# Patient Record
Sex: Male | Born: 1940 | Race: White | Hispanic: No | Marital: Married | Smoking: Former smoker
Health system: Southern US, Community
[De-identification: ages and names within clinical notes are randomized; demographics above are authoritative.]

## PROBLEM LIST (undated history)

## (undated) DIAGNOSIS — F40240 Claustrophobia: Secondary | ICD-10-CM

## (undated) DIAGNOSIS — E119 Type 2 diabetes mellitus without complications: Secondary | ICD-10-CM

## (undated) DIAGNOSIS — I493 Ventricular premature depolarization: Secondary | ICD-10-CM

## (undated) DIAGNOSIS — F419 Anxiety disorder, unspecified: Secondary | ICD-10-CM

## (undated) DIAGNOSIS — C4490 Unspecified malignant neoplasm of skin, unspecified: Secondary | ICD-10-CM

## (undated) DIAGNOSIS — G4733 Obstructive sleep apnea (adult) (pediatric): Secondary | ICD-10-CM

## (undated) DIAGNOSIS — I519 Heart disease, unspecified: Secondary | ICD-10-CM

## (undated) DIAGNOSIS — K529 Noninfective gastroenteritis and colitis, unspecified: Secondary | ICD-10-CM

## (undated) DIAGNOSIS — I10 Essential (primary) hypertension: Secondary | ICD-10-CM

## (undated) DIAGNOSIS — I4891 Unspecified atrial fibrillation: Secondary | ICD-10-CM

## (undated) DIAGNOSIS — E785 Hyperlipidemia, unspecified: Secondary | ICD-10-CM

## (undated) DIAGNOSIS — I509 Heart failure, unspecified: Secondary | ICD-10-CM

## (undated) DIAGNOSIS — S2232XA Fracture of one rib, left side, initial encounter for closed fracture: Secondary | ICD-10-CM

## (undated) DIAGNOSIS — M109 Gout, unspecified: Secondary | ICD-10-CM

## (undated) HISTORY — DX: Hyperlipidemia, unspecified: E78.5

## (undated) HISTORY — DX: Fracture of one rib, left side, initial encounter for closed fracture: S22.32XA

## (undated) HISTORY — DX: Type 2 diabetes mellitus without complications: E11.9

## (undated) HISTORY — DX: Anxiety disorder, unspecified: F41.9

## (undated) HISTORY — DX: Gout, unspecified: M10.9

## (undated) HISTORY — DX: Essential (primary) hypertension: I10

## (undated) HISTORY — DX: Heart disease, unspecified: I51.9

## (undated) HISTORY — DX: Obstructive sleep apnea (adult) (pediatric): G47.33

## (undated) HISTORY — DX: Unspecified atrial fibrillation: I48.91

## (undated) HISTORY — DX: Claustrophobia: F40.240

## (undated) HISTORY — DX: Unspecified malignant neoplasm of skin, unspecified: C44.90

## (undated) HISTORY — PX: MOHS SURGERY: SUR867

## (undated) HISTORY — DX: Ventricular premature depolarization: I49.3

## (undated) HISTORY — DX: Noninfective gastroenteritis and colitis, unspecified: K52.9

## (undated) HISTORY — DX: Heart failure, unspecified: I50.9

---

## 1998-07-02 ENCOUNTER — Other Ambulatory Visit: Admission: RE | Admit: 1998-07-02 | Discharge: 1998-07-02 | Payer: Self-pay | Admitting: Gastroenterology

## 2000-11-15 ENCOUNTER — Encounter: Payer: Self-pay | Admitting: Emergency Medicine

## 2000-11-15 ENCOUNTER — Emergency Department (HOSPITAL_COMMUNITY): Admission: EM | Admit: 2000-11-15 | Discharge: 2000-11-16 | Payer: Self-pay | Admitting: Emergency Medicine

## 2003-03-05 ENCOUNTER — Encounter: Admission: RE | Admit: 2003-03-05 | Discharge: 2003-03-05 | Payer: Self-pay | Admitting: Internal Medicine

## 2003-12-17 ENCOUNTER — Ambulatory Visit: Payer: Self-pay | Admitting: Internal Medicine

## 2004-01-28 ENCOUNTER — Ambulatory Visit: Payer: Self-pay | Admitting: Internal Medicine

## 2004-02-29 ENCOUNTER — Ambulatory Visit: Payer: Self-pay | Admitting: Internal Medicine

## 2004-03-07 ENCOUNTER — Ambulatory Visit: Payer: Self-pay | Admitting: Internal Medicine

## 2004-05-19 ENCOUNTER — Ambulatory Visit: Payer: Self-pay | Admitting: Gastroenterology

## 2004-05-31 ENCOUNTER — Ambulatory Visit: Payer: Self-pay | Admitting: Gastroenterology

## 2004-06-06 ENCOUNTER — Ambulatory Visit: Payer: Self-pay | Admitting: Internal Medicine

## 2005-01-19 ENCOUNTER — Ambulatory Visit: Payer: Self-pay | Admitting: Internal Medicine

## 2005-01-25 ENCOUNTER — Ambulatory Visit: Payer: Self-pay | Admitting: Internal Medicine

## 2005-02-20 ENCOUNTER — Ambulatory Visit: Payer: Self-pay | Admitting: Internal Medicine

## 2005-02-28 ENCOUNTER — Ambulatory Visit: Payer: Self-pay

## 2005-03-31 ENCOUNTER — Ambulatory Visit: Payer: Self-pay | Admitting: Internal Medicine

## 2005-12-07 ENCOUNTER — Ambulatory Visit: Payer: Self-pay | Admitting: Internal Medicine

## 2005-12-27 ENCOUNTER — Ambulatory Visit: Payer: Self-pay | Admitting: Internal Medicine

## 2006-02-26 DIAGNOSIS — Z860100 Personal history of colon polyps, unspecified: Secondary | ICD-10-CM | POA: Insufficient documentation

## 2006-02-26 DIAGNOSIS — I10 Essential (primary) hypertension: Secondary | ICD-10-CM | POA: Insufficient documentation

## 2006-04-30 ENCOUNTER — Ambulatory Visit: Payer: Self-pay | Admitting: Internal Medicine

## 2006-04-30 LAB — CONVERTED CEMR LAB
Creatinine,U: 288.8 mg/dL
LDL Cholesterol: 99 mg/dL (ref 0–99)
Microalb Creat Ratio: 4.8 mg/g (ref 0.0–30.0)
Microalb, Ur: 1.4 mg/dL (ref 0.0–1.9)
VLDL: 15 mg/dL (ref 0–40)

## 2006-05-11 ENCOUNTER — Ambulatory Visit: Payer: Self-pay | Admitting: Internal Medicine

## 2006-08-27 ENCOUNTER — Ambulatory Visit: Payer: Self-pay | Admitting: Internal Medicine

## 2006-08-27 LAB — CONVERTED CEMR LAB
BUN: 12 mg/dL (ref 6–23)
Creatinine, Ser: 0.9 mg/dL (ref 0.4–1.5)
Hgb A1c MFr Bld: 6 % (ref 4.6–6.0)
PSA: 0.74 ng/mL (ref 0.10–4.00)

## 2006-09-03 ENCOUNTER — Ambulatory Visit: Payer: Self-pay | Admitting: Internal Medicine

## 2006-09-21 ENCOUNTER — Encounter: Payer: Self-pay | Admitting: Internal Medicine

## 2007-03-18 ENCOUNTER — Telehealth (INDEPENDENT_AMBULATORY_CARE_PROVIDER_SITE_OTHER): Payer: Self-pay | Admitting: *Deleted

## 2007-03-18 ENCOUNTER — Ambulatory Visit: Payer: Self-pay | Admitting: Internal Medicine

## 2007-03-25 ENCOUNTER — Ambulatory Visit: Payer: Self-pay | Admitting: Internal Medicine

## 2007-08-05 ENCOUNTER — Telehealth (INDEPENDENT_AMBULATORY_CARE_PROVIDER_SITE_OTHER): Payer: Self-pay | Admitting: *Deleted

## 2007-09-02 ENCOUNTER — Telehealth (INDEPENDENT_AMBULATORY_CARE_PROVIDER_SITE_OTHER): Payer: Self-pay | Admitting: *Deleted

## 2007-09-12 ENCOUNTER — Ambulatory Visit: Payer: Self-pay | Admitting: Internal Medicine

## 2007-09-23 ENCOUNTER — Encounter (INDEPENDENT_AMBULATORY_CARE_PROVIDER_SITE_OTHER): Payer: Self-pay | Admitting: *Deleted

## 2007-09-23 LAB — CONVERTED CEMR LAB
BUN: 13 mg/dL (ref 6–23)
Cholesterol: 141 mg/dL (ref 0–200)
LDL Cholesterol: 76 mg/dL (ref 0–99)
Microalb Creat Ratio: 3.8 mg/g (ref 0.0–30.0)
Microalb, Ur: 0.7 mg/dL (ref 0.0–1.9)
PSA: 0.63 ng/mL (ref 0.10–4.00)

## 2007-10-21 ENCOUNTER — Ambulatory Visit: Payer: Self-pay | Admitting: Internal Medicine

## 2007-10-21 DIAGNOSIS — E7800 Pure hypercholesterolemia, unspecified: Secondary | ICD-10-CM | POA: Insufficient documentation

## 2007-10-21 DIAGNOSIS — F419 Anxiety disorder, unspecified: Secondary | ICD-10-CM | POA: Insufficient documentation

## 2008-04-16 ENCOUNTER — Ambulatory Visit: Payer: Self-pay | Admitting: Internal Medicine

## 2008-04-21 ENCOUNTER — Encounter (INDEPENDENT_AMBULATORY_CARE_PROVIDER_SITE_OTHER): Payer: Self-pay | Admitting: *Deleted

## 2008-09-21 ENCOUNTER — Telehealth (INDEPENDENT_AMBULATORY_CARE_PROVIDER_SITE_OTHER): Payer: Self-pay | Admitting: *Deleted

## 2008-12-10 ENCOUNTER — Ambulatory Visit: Payer: Self-pay | Admitting: Internal Medicine

## 2008-12-10 DIAGNOSIS — R739 Hyperglycemia, unspecified: Secondary | ICD-10-CM | POA: Insufficient documentation

## 2008-12-15 ENCOUNTER — Telehealth (INDEPENDENT_AMBULATORY_CARE_PROVIDER_SITE_OTHER): Payer: Self-pay | Admitting: *Deleted

## 2008-12-15 ENCOUNTER — Ambulatory Visit: Payer: Self-pay | Admitting: Internal Medicine

## 2008-12-21 LAB — CONVERTED CEMR LAB
Albumin: 4.1 g/dL (ref 3.5–5.2)
Alkaline Phosphatase: 63 units/L (ref 39–117)
Cholesterol: 148 mg/dL (ref 0–200)
Creatinine, Ser: 1 mg/dL (ref 0.4–1.5)
Creatinine,U: 125.3 mg/dL
LDL Cholesterol: 84 mg/dL (ref 0–99)
Microalb Creat Ratio: 5.6 mg/g (ref 0.0–30.0)
Microalb, Ur: 0.7 mg/dL (ref 0.0–1.9)
Potassium: 5 meq/L (ref 3.5–5.1)
Triglycerides: 57 mg/dL (ref 0.0–149.0)

## 2009-05-28 ENCOUNTER — Encounter (INDEPENDENT_AMBULATORY_CARE_PROVIDER_SITE_OTHER): Payer: Self-pay | Admitting: *Deleted

## 2009-11-15 HISTORY — PX: US ECHOCARDIOGRAPHY: HXRAD669

## 2009-12-30 ENCOUNTER — Ambulatory Visit: Payer: Self-pay | Admitting: Internal Medicine

## 2010-01-08 ENCOUNTER — Ambulatory Visit: Payer: Self-pay | Admitting: Family Medicine

## 2010-01-14 ENCOUNTER — Inpatient Hospital Stay (HOSPITAL_COMMUNITY)
Admission: EM | Admit: 2010-01-14 | Discharge: 2010-01-17 | Payer: Self-pay | Source: Home / Self Care | Attending: Internal Medicine | Admitting: Internal Medicine

## 2010-01-14 ENCOUNTER — Ambulatory Visit: Payer: Self-pay | Admitting: Internal Medicine

## 2010-01-14 ENCOUNTER — Encounter: Payer: Self-pay | Admitting: Internal Medicine

## 2010-01-14 DIAGNOSIS — I499 Cardiac arrhythmia, unspecified: Secondary | ICD-10-CM | POA: Insufficient documentation

## 2010-01-14 DIAGNOSIS — J45909 Unspecified asthma, uncomplicated: Secondary | ICD-10-CM | POA: Insufficient documentation

## 2010-01-15 ENCOUNTER — Encounter (INDEPENDENT_AMBULATORY_CARE_PROVIDER_SITE_OTHER): Payer: Self-pay | Admitting: Internal Medicine

## 2010-01-26 ENCOUNTER — Ambulatory Visit: Payer: Self-pay | Admitting: Internal Medicine

## 2010-01-26 DIAGNOSIS — E119 Type 2 diabetes mellitus without complications: Secondary | ICD-10-CM | POA: Insufficient documentation

## 2010-01-26 DIAGNOSIS — I5022 Chronic systolic (congestive) heart failure: Secondary | ICD-10-CM

## 2010-01-26 DIAGNOSIS — I4891 Unspecified atrial fibrillation: Secondary | ICD-10-CM | POA: Insufficient documentation

## 2010-01-26 DIAGNOSIS — I5023 Acute on chronic systolic (congestive) heart failure: Secondary | ICD-10-CM | POA: Insufficient documentation

## 2010-02-07 ENCOUNTER — Encounter: Payer: Self-pay | Admitting: Internal Medicine

## 2010-02-10 ENCOUNTER — Ambulatory Visit: Payer: Self-pay | Admitting: Cardiology

## 2010-02-16 ENCOUNTER — Ambulatory Visit (HOSPITAL_COMMUNITY)
Admission: RE | Admit: 2010-02-16 | Discharge: 2010-02-16 | Payer: Self-pay | Source: Home / Self Care | Attending: Cardiology | Admitting: Cardiology

## 2010-02-21 NOTE — Op Note (Addendum)
  NAMEYADRIEL, KERRIGAN NO.:  [id number]  MEDICAL RECORD NO.:  [id number]          PATIENT TYPE:  OIB  LOCATION:  2899                         FACILITY:  [facility]  PHYSICIAN:  Mehul Rudin M. [place], M.D.  DATE OF BIRTH:  [birth year]  DATE OF PROCEDURE:  02/16/2010 DATE OF DISCHARGE:  02/16/2010                              OPERATIVE REPORT   INDICATION FOR PROCEDURE:  A 70 year old male with persistent atrial fibrillation.  PROCEDURE:  The patient had an IV in place and was administered oxygen therapy.  Per anesthesia, he received 100 mg of IV propofol.  He then received a single synchronized biphasic DC shock at 120 joules with prompt return to sinus rhythm.  There were no complications.  IMPRESSION:  Successful elective direct current cardioversion.          ______________________________ Cary Lothrop M. [place], M.D.     PMJ/MEDQ  D:  02/16/2010  T:  02/17/2010  Job:  [id number]  Electronically Signed by Alaysiah Browder [place] M.D. on 02/21/2010 03:21:32 PM

## 2010-02-23 ENCOUNTER — Encounter: Payer: Self-pay | Admitting: Internal Medicine

## 2010-02-24 ENCOUNTER — Ambulatory Visit: Payer: Self-pay | Admitting: Cardiology

## 2010-02-27 LAB — CONVERTED CEMR LAB
Cholesterol, target level: 200 mg/dL
HDL goal, serum: 40 mg/dL
LDL Goal: 130 mg/dL

## 2010-03-01 ENCOUNTER — Telehealth (INDEPENDENT_AMBULATORY_CARE_PROVIDER_SITE_OTHER): Payer: Self-pay | Admitting: *Deleted

## 2010-03-01 NOTE — Letter (Signed)
Summary: Colonoscopy Letter  [facility]  [address] [place], [place] [address]   Phone: [phone]  Fax: [phone]      May 28, 2009 MRN: [id number]   [facility] [address] [place], [place]  [address]   Dear Mr. MCGINTYJR,   According to your medical record, it is time for you to schedule a Colonoscopy. The American Cancer Society recommends this procedure as a method to detect early colon cancer. Patients with a family history of colon cancer, or a personal history of colon polyps or inflammatory bowel disease are at increased risk.  This letter has beeen generated based on the recommendations made at the time of your procedure. If you feel that in your particular situation this may no longer apply, please contact our office.  Please call our office at [phone] to schedule this appointment or to update your records at your earliest convenience.  Thank you for cooperating with [place] to provide you with the very best care possible.   Sincerely,  Judie Petit T. Russella Dar, M.D.  [facility] [phone]

## 2010-03-01 NOTE — Assessment & Plan Note (Signed)
Summary: cough/dlo   Vital Signs:  Patient profile:   70 year old male Height:      68.25 inches Weight:      217 pounds BMI:     32.87 O2 Sat:      96 % on Room air Temp:     97.1 degrees F oral Pulse rate:   94 / minute BP sitting:   112 / 72  (left arm) Cuff size:   large  Vitals Entered By: Payton Spark CMA (January 08, 2010 11:39 AM)  O2 Flow:  Room air CC: Cough and congestion x weeks.   History of Present Illness:  This is a 70 year old man who [facility] with worsening URI symptoms.  Seen by Dr. Alwyn Ren on 12/1.  S/p Zpack.     onset 11/28 as severe cough, now more productive.  Hydromet helped but ran out. Feels a little feverish although has not checked his temp. When he saw Dr. Alwyn Ren, did not have nasal congestion, now has purulent nasal discharge and mild ear discomfort.     Feels like he is still wheezing.    Current Medications (verified): 1)  Paroxetine Hcl 20 Mg  Tabs (Paroxetine Hcl) .... 1/2 By Mouth Qd 2)  Norvasc 10 Mg Tabs (Amlodipine Besylate) .... Take 1 Tablet By Mouth Once A Day 3)  Lipitor 20 Mg Tabs (Atorvastatin Calcium) .Marland Kitchen.. 1 By Mouth 4x/week 4)  Tenormin 50 Mg Tabs (Atenolol) .... 1/2 Bid 5)  Aspirin 81 Mg  Tbec (Aspirin) 6)  Benazepril Hcl 40 Mg  Tabs (Benazepril Hcl) .Marland Kitchen.. 1 By Mouth Once Daily 7)  Garlic 8)  Multivitamins  Tabs (Multiple Vitamin) .Marland Kitchen.. 1 By Mouth Once Daily 9)  Hydromet 5-1.5 Mg/81ml Syrp (Hydrocodone-Homatropine) .Marland Kitchen.. 1 Tsp Every 6 Hrs 10)  Advair Diskus 250-50 Mcg/dose Aepb (Fluticasone-Salmeterol) .Marland Kitchen.. 1 Inhalation Every 12 Hrs ; Gargle & Spit After Use 11)  Azithromycin 250 Mg  Tabs (Azithromycin) .... 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 ENT:  Complains of nasal congestion, sinus pressure, and sore throat. CV:  Denies chest pain or discomfort. Resp:  Complains of cough, sputum productive, and wheezing; denies shortness of breath. GI:  Denies nausea and vomiting.  Physical Exam  General:  in no acute distress; alert,appropriate and cooperative throughout examination VSS, non toxic appearing. Ears:  Hearing aids bilaterally TMs retracted. Nose:  External nasal examination shows no deformity or inflammation. boggy turbinates, frontal sinuses mildly TTP. Mouth:  Oral mucosa and oropharynx without lesions or exudates.  Teeth in good repair. Mild pharyngeal erythema.   Lungs:  Normal respiratory effort, chest expands symmetrically. Lungs : scattered , homogenous rhonchi &  wheezes w/o increased WOB. Heart:  Normal rate and regular rhythm. S1 and S2 normal  without gallop, murmur, click, rub or other extra sounds. Extremities:  No clubbing, cyanosis, edema. Psych:  memory intact for recent and remote, normally interactive, and good eye contact.     Impression & Recommendations:  Problem # 1:  BRONCHITIS-ACUTE (ICD-466.0) Assessment Deteriorated  Will repeat course of Zpack, continue Advair. Refill Hydromet. If symptoms progress, consider oral steroid burst.  The following medications were removed from the medication list:    Azithromycin 250 Mg Tabs (Azithromycin) .Marland Kitchen... Asper pack His updated  medication list for this problem includes:    Hydromet 5-1.5 Mg/80ml Syrp (Hydrocodone-homatropine) .Marland Kitchen... 1 tsp every 6 hrs    Advair Diskus 250-50 Mcg/dose Aepb (Fluticasone-salmeterol) .Marland Kitchen... 1 inhalation every 12 hrs ; gargle & spit after use    Azithromycin 250 Mg Tabs (Azithromycin) .Marland Kitchen... 2 by  mouth today and then 1 daily for 4 days  Orders: Prescription Created Electronically [phone])  Complete Medication List: 1)  Paroxetine Hcl 20 Mg Tabs (Paroxetine hcl) .... 1/2 by mouth qd 2)  Norvasc 10 Mg Tabs (Amlodipine besylate) .... Take 1 tablet by mouth once a day 3)  Lipitor 20 Mg Tabs (Atorvastatin calcium) .Marland Kitchen.. 1 by mouth 4x/week 4)  Tenormin 50 Mg Tabs (Atenolol) .... 1/2 bid 5)  Aspirin 81 Mg Tbec (Aspirin) 6)  Benazepril Hcl 40 Mg Tabs (benazepril Hcl)  .Marland Kitchen.. 1 by mouth once daily 7)  Garlic  8)  Multivitamins Tabs (Multiple vitamin) .Marland Kitchen.. 1 by mouth once daily 9)  Hydromet 5-1.5 Mg/38ml Syrp (Hydrocodone-homatropine) .Marland Kitchen.. 1 tsp every 6 hrs 10)  Advair Diskus 250-50 Mcg/dose Aepb (Fluticasone-salmeterol) .Marland Kitchen.. 1 inhalation every 12 hrs ; gargle & spit after use 11)  Azithromycin 250 Mg Tabs (Azithromycin) .... 2 by  mouth today and then 1 daily for 4 days Prescriptions: AZITHROMYCIN 250 MG  TABS (AZITHROMYCIN) 2 by  mouth today and then 1 daily for 4 days  #6 x 0   Entered and Authorized by:   Ruthe Mannan MD   Signed by:   Ruthe Mannan MD on 01/08/2010   Method used:   Electronically to        [facility] (retail)       [address]       [place], [place]  [address]       Ph: [phone] or [phone]       Fax: [phone]   RxID:   [id number] AZITHROMYCIN 250 MG  TABS (AZITHROMYCIN) 2 by  mouth today and then 1 daily for 4 days  #6 x 0   Entered and Authorized by:   Ruthe Mannan MD   Signed by:   Ruthe Mannan MD on 01/08/2010   Method used:   Print then Give to Patient   RxID:   [id number] HYDROMET 5-1.5 MG/5ML SYRP (HYDROCODONE-HOMATROPINE) 1 tsp every 6 hrs  #5  ounces x 0   Entered and Authorized by:   Ruthe Mannan MD   Signed by:   Ruthe Mannan MD on 01/08/2010   Method used:   Print then Give to Patient   RxID:   [id number]    Orders Added: 1)  Est. Patient Level IV [06301] 2)  Prescription Created Electronically [id number]

## 2010-03-01 NOTE — Assessment & Plan Note (Signed)
Summary: COLD/KN   Vital Signs:  Patient profile:   70 year old male Weight:      219.8 pounds BMI:     33.30 O2 Sat:      91 % on Room air Temp:     98.9 degrees F oral Pulse rate:   76 / minute Resp:     15 per minute BP sitting:   136 / 80  (left arm) Cuff size:   large  Vitals Entered By: Shonna Chock CMA (December 30, 2009 11:54 AM)  O2 Flow:  Room air CC: Cold x 3-4 days , URI symptoms   CC:  Cold x 3-4 days  and URI symptoms.  History of Present Illness:      This is a 70 year old man who presents with  RTI symptoms ; onset 11/28 as severe cough with N&V X 1.  The patient reports productive cough, but denies nasal congestion, purulent nasal discharge, and earache.  Associated symptoms include low-grade fever (<100.5 degrees),  and wheezing w/o dyspnea.  The patient denies headache & bilateral facial pain, tooth pain, and tender adenopathy.  Rx: Nyquil. No PMH of asthma.  Current Medications (verified): 1)  Paroxetine Hcl 20 Mg  Tabs (Paroxetine Hcl) .... 1/2 By Mouth Qd 2)  Norvasc 10 Mg Tabs (Amlodipine Besylate) .... Take 1 Tablet By Mouth Once A Day 3)  Lipitor 20 Mg Tabs (Atorvastatin Calcium) .Marland Kitchen.. 1 By Mouth 4x/week 4)  Tenormin 50 Mg Tabs (Atenolol) .... 1/2 Bid 5)  Aspirin 81 Mg  Tbec (Aspirin) 6)  Benazepril Hcl 40 Mg  Tabs (Benazepril Hcl) .Marland Kitchen.. 1 By Mouth Once Daily 7)  Garlic 8)  Multivitamins  Tabs (Multiple Vitamin) .Marland Kitchen.. 1 By Mouth Once Daily  Allergies (verified): No Known Drug Allergies  Physical Exam  General:  in no acute distress; alert,appropriate and cooperative throughout examination Ears:  Hearing aids bilaterally Nose:  External nasal examination shows no deformity or inflammation. Nasal mucosa are pink and moist without lesions or exudates. hyponasal speech Mouth:  Oral mucosa and oropharynx without lesions or exudates.  Teeth in good repair. Mild pharyngeal erythema.   Lungs:  Normal respiratory effort, chest expands symmetrically. Lungs :  scattered , homogenous rhonchi &  wheezes w/o increased WOB. Heart:  Normal rate and regular rhythm. S1 and S2 normal without gallop, murmur, click, rub or other extra sounds. Cervical Nodes:  No lymphadenopathy noted Axillary Nodes:  No palpable lymphadenopathy   Impression & Recommendations:  Problem # 1:  BRONCHITIS-ACUTE (ICD-466.0)  RAD component with wheezing  His updated medication list for this problem includes:    Hydromet 5-1.5 Mg/43ml Syrp (Hydrocodone-homatropine) .Marland Kitchen... 1 tsp every 6 hrs    Azithromycin 250 Mg Tabs (Azithromycin) .Marland Kitchen... Asper pack    Advair Diskus 250-50 Mcg/dose Aepb (Fluticasone-salmeterol) .Marland Kitchen... 1 inhalation every 12 hrs ; gargle & spit after use  Complete Medication List: 1)  Paroxetine Hcl 20 Mg Tabs (Paroxetine hcl) .... 1/2 by mouth qd 2)  Norvasc 10 Mg Tabs (Amlodipine besylate) .... Take 1 tablet by mouth once a day 3)  Lipitor 20 Mg Tabs (Atorvastatin calcium) .Marland Kitchen.. 1 by mouth 4x/week 4)  Tenormin 50 Mg Tabs (Atenolol) .... 1/2 bid 5)  Aspirin 81 Mg Tbec (Aspirin) 6)  Benazepril Hcl 40 Mg Tabs (benazepril Hcl)  .Marland Kitchen.. 1 by mouth once daily 7)  Garlic  8)  Multivitamins Tabs (Multiple vitamin) .Marland Kitchen.. 1 by mouth once daily 9)  Hydromet 5-1.5 Mg/91ml Syrp (Hydrocodone-homatropine) .Marland KitchenMarland KitchenMarland Kitchen 1  tsp every 6 hrs 10)  Azithromycin 250 Mg Tabs (Azithromycin) .... Asper pack 11)  Advair Diskus 250-50 Mcg/dose Aepb (Fluticasone-salmeterol) .Marland Kitchen.. 1 inhalation every 12 hrs ; gargle & spit after use  Patient Instructions: 1)  Drink as much  NON dairy fluid as you can tolerate for the next few days. Prescriptions: ADVAIR DISKUS 250-50 MCG/DOSE AEPB (FLUTICASONE-SALMETEROL) 1 inhalation every 12 hrs ; gargle & spit after use  #1 x 0   Entered and Authorized by:   Marga Melnick MD   Signed by:   Marga Melnick MD on 12/30/2009   Method used:   Samples Given   RxID:   [id number] AZITHROMYCIN 250 MG TABS (AZITHROMYCIN) asper pack  #1 x 0   Entered and Authorized  by:   Marga Melnick MD   Signed by:   Marga Melnick MD on 12/30/2009   Method used:   Print then Give to Patient   RxID:   [id number] HYDROMET 5-1.5 MG/5ML SYRP (HYDROCODONE-HOMATROPINE) 1 tsp every 6 hrs  #120cc x 0   Entered and Authorized by:   Marga Melnick MD   Signed by:   Marga Melnick MD on 12/30/2009   Method used:   Print then Give to Patient   RxID:   [id number]    Orders Added: 1)  Est. Patient Level III [536[address]]

## 2010-03-02 ENCOUNTER — Ambulatory Visit: Payer: Self-pay | Admitting: *Deleted

## 2010-03-02 ENCOUNTER — Encounter: Payer: Self-pay | Admitting: Cardiovascular Disease

## 2010-03-02 ENCOUNTER — Encounter: Payer: Self-pay | Admitting: Internal Medicine

## 2010-03-02 ENCOUNTER — Ambulatory Visit (HOSPITAL_COMMUNITY): Payer: Medicare Other | Attending: Internal Medicine

## 2010-03-02 DIAGNOSIS — R0609 Other forms of dyspnea: Secondary | ICD-10-CM

## 2010-03-02 DIAGNOSIS — R0789 Other chest pain: Secondary | ICD-10-CM

## 2010-03-02 DIAGNOSIS — R0989 Other specified symptoms and signs involving the circulatory and respiratory systems: Secondary | ICD-10-CM

## 2010-03-02 DIAGNOSIS — I4949 Other premature depolarization: Secondary | ICD-10-CM

## 2010-03-02 DIAGNOSIS — I4891 Unspecified atrial fibrillation: Secondary | ICD-10-CM | POA: Insufficient documentation

## 2010-03-02 HISTORY — PX: CARDIOVASCULAR STRESS TEST: SHX262

## 2010-03-03 ENCOUNTER — Encounter: Payer: Self-pay | Admitting: *Deleted

## 2010-03-03 NOTE — Assessment & Plan Note (Signed)
Summary: HOSPITAL FOLLOWUP/OK PER CHRAE/KN   Vital Signs:  Patient profile:   70 year old male Weight:      217.8 pounds BMI:     32.99 Temp:     98.3 degrees F oral Pulse rate:   72 / minute Resp:     15 per minute BP sitting:   118 / 80  (left arm) Cuff size:   large  Vitals Entered By: Shonna Chock CMA (January 26, 2010 2:33 PM) CC: 1.)  Hospital Follow-up  2.) Refill Benazepril and Paroxetine, Type 2 diabetes mellitus follow-up   CC:  1.)  Hospital Follow-up  2.) Refill Benazepril and Paroxetine and Type 2 diabetes mellitus follow-up.  History of Present Illness:   Discharge Summary reviewed:diagnoses of  CAP, AF , CHF, & DM. PMH of "pre Diabetes", but A1c in hospital was 6.5%( average sugar 140 , risk of 30%)  indicating frank Diabetes. Metformin was started in hospital.A1c high had been 6.2 % in 2009;it was 6.1% in 2010.     The patient reports weight loss of 2 # since D/C , but denies polyuria, polydipsia, blurred vision, self managed hypoglycemia, and numbness of extremities.  The patient denies the following symptoms: neuropathic pain, chest pain, vomiting, orthostatic symptoms, poor wound healing, intermittent claudication, vision loss, and foot ulcer.  Since the last visit the patient reports good dietary compliance, not exercising regularly since D/c, and not monitoring blood glucose.  No FH of DM. Reading [employer].  No retinopathy as per Dr Shea Evans today.  Current Medications (verified): 1)  Paroxetine Hcl 20 Mg  Tabs (Paroxetine Hcl) .... 1/2 By Mouth Qd 2)  Norvasc 10 Mg Tabs (Amlodipine Besylate) .... Take 1 Tablet By Mouth Once A Day 3)  Lipitor 20 Mg Tabs (Atorvastatin Calcium) .Marland Kitchen.. 1 By Mouth 4x/week 4)  Aspirin 81 Mg  Tbec (Aspirin) 5)  Benazepril Hcl 40 Mg  Tabs (Benazepril Hcl) .Marland Kitchen.. 1 By Mouth Once Daily 6)  Garlic 7)  Multivitamins  Tabs (Multiple Vitamin) .Marland Kitchen.. 1 By Mouth Once Daily 8)  Advair Diskus 250-50 Mcg/dose Aepb (Fluticasone-Salmeterol) .Marland Kitchen..  1 Inhalation Every 12 Hrs ; Gargle & Spit After Use 9)  Alprazolam 0.25 Mg Tabs (Alprazolam) .... 1/2 By Mouth Two Times A Day 10)  Pradaxa 150 Mg Caps (Dabigatran Etexilate Mesylate) .Marland Kitchen.. 1 By Mouth Two Times A Day 11)  Digoxin 0.125 Mg Tabs (Digoxin) .Marland Kitchen.. 1 By Mouth Once Daily 12)  Furosemide 20 Mg Tabs (Furosemide) .Marland Kitchen.. 1 By Mouth Once Daily 13)  Metformin Hcl 500 Mg Tabs (Metformin Hcl) .Marland Kitchen.. 1 By Mouth Two Times A Day 14)  Metoprolol Tartrate 25 Mg Tabs (Metoprolol Tartrate) .Marland Kitchen.. 1 By Mouth Two Times A Day 15)  Xopenex Hfa 45 Mcg/act Aero (Levalbuterol Tartrate) .... 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 Lungs are clear to auscultation, no crackles or wheezes. Heart:  no gallop, no rub, no JVD, no HJR, bradycardia, and irregular rhythm.  Abdomen:  Bowel sounds positive,abdomen soft and non-tender without masses, organomegaly .Ventral  hernia  noted. Pulses:  R and L carotid,radial,dorsalis pedis and posterior tibial pulses are full and equal bilaterally Extremities:  No clubbing, cyanosis, edema. Neurologic:  alert & oriented X3 and sensation intact to light touch over feet.   Skin:  Intact without suspicious lesions or rashes Psych:  memory intact for recent and remote, normally interactive, and good eye contact.     Impression & Recommendations:  Problem # 1:  DIABETES MELLITUS, TYPE II  (ICD-250.00) new onset; Glucometer teaching completed His updated medication list for this problem includes:    Aspirin 81 Mg Tbec (Aspirin)    Metformin Hcl 500 Mg Tabs (Metformin hcl) .Marland Kitchen... 1  once daily with largest meal  Problem # 2:  ATRIAL FIBRILLATION WITH SLOW VENTRICULAR RESPONSE (ICD-427.31) rate controlled The following medications were removed from the medication list:    Tenormin 50 Mg Tabs (Atenolol) .Marland Kitchen... 1/2 bid His updated medication list for this problem includes:    Norvasc 10 Mg Tabs (Amlodipine besylate) .Marland Kitchen... Take 1 tablet by mouth once a day    Aspirin 81 Mg Tbec (Aspirin)    Digoxin 0.125 Mg Tabs (Digoxin) .Marland Kitchen... 1 by mouth once daily    Metoprolol Tartrate 25 Mg Tabs (Metoprolol tartrate) .Marland Kitchen... 1 by mouth two times a day  Problem # 3:  CONGESTIVE HEART FAILURE, ACUTE, SYSTOLIC (ICD-428.21) resolved The following medications were removed from the medication list:    Tenormin 50 Mg Tabs (Atenolol) .Marland Kitchen... 1/2 bid His updated medication list for this problem includes:    Aspirin 81 Mg Tbec (Aspirin)    Digoxin 0.125 Mg Tabs (Digoxin) .Marland Kitchen... 1 by mouth once daily    Furosemide 20 Mg Tabs (Furosemide) .Marland Kitchen... 1 by mouth once daily    Metoprolol Tartrate 25 Mg Tabs (Metoprolol tartrate) .Marland Kitchen... 1 by mouth two times a day  Complete Medication List: 1)  Paroxetine Hcl 20 Mg Tabs (Paroxetine hcl) .... 1/2 by mouth qd 2)  Norvasc 10 Mg Tabs (Amlodipine besylate) .... Take 1 tablet by mouth once a day 3)  Lipitor 20 Mg Tabs (Atorvastatin calcium) .Marland Kitchen.. 1 by mouth 4x/week 4)  Aspirin 81 Mg Tbec (Aspirin) 5)  Benazepril Hcl 40 Mg Tabs (benazepril Hcl)  .Marland Kitchen.. 1 by mouth once daily 6)  Garlic  7)  Multivitamins Tabs (Multiple vitamin) .Marland Kitchen.. 1 by mouth once daily 8)  Advair Diskus 250-50 Mcg/dose Aepb (Fluticasone-salmeterol) .Marland Kitchen.. 1 inhalation every 12 hrs ; gargle & spit after use 9)  Alprazolam 0.25 Mg Tabs (Alprazolam) .... 1/2 by mouth two times a day 10)  Pradaxa 150 Mg Caps  (Dabigatran etexilate mesylate) .Marland Kitchen.. 1 by mouth two times a day 11)  Digoxin 0.125 Mg Tabs (Digoxin) .Marland Kitchen.. 1 by mouth once daily 12)  Furosemide 20 Mg Tabs (Furosemide) .Marland Kitchen.. 1 by mouth once daily 13)  Metformin Hcl 500 Mg Tabs (Metformin hcl) .Marland Kitchen.. 1  once daily with largest meal 14)  Metoprolol Tartrate 25 Mg Tabs (Metoprolol tartrate) .Marland Kitchen.. 1 by mouth two times a day 15)  Xopenex Hfa 45 Mcg/act Aero (Levalbuterol tartrate) .... 2 puffs every 4 hours as needed 16)  Onetouch Ultra Blue Strp (Glucose blood) .... Check bloodsugar daily as directed 17)  Onetouch Delica Lancets Misc (Lancets) .... Check bloodsugar daily as directed  Patient Instructions: 1)  Consume LESS THAN 40 grams of sugar / day from foods & drinks with High Fructose Corn Syrup as #2, 3 or #4 on  label.  2)  Please schedule a follow-up appointment in 3 months. 3)  BUn,creat,K+prior to visit, ICD-9:995.20, 401.9 4)  HbgA1C prior to visit, ICD-9:250.00 5)  Urine Microalbumin prior to visit, ICD-9:250.00. 6)  Check your blood sugars regularly.   If your FASTING (am before eating)  readings M. W , & F  are usually above : 150 or below 90 you should contact our office. 7)  Check your feet each night for sore areas, calluses or signs of infection. Prescriptions: ONETOUCH DELICA LANCETS  MISC (LANCETS) check bloodsugar daily as directed  #100 x 1   Entered by:   Shonna Chock CMA   Authorized by:   Marga Melnick MD   Signed by:   Shonna Chock CMA on 01/26/2010   Method used:   Electronically to        [facility]       [address]       [place], [place]  [address]       Ph: [phone] or [phone]       Fax: [phone]   RxID:   [id number] ONETOUCH ULTRA BLUE  STRP (GLUCOSE BLOOD) check bloodsugar daily as directed  #100 x 1   Entered by:   Shonna Chock CMA   Authorized by:   Marga Melnick MD   Signed by:   Shonna Chock CMA on 01/26/2010   Method used:   Electronically to        [facility]  (retail)       [address]       [place], [place]  [address]       Ph: [phone] or [phone]       Fax: [phone]   RxID:   [id number] PAROXETINE HCL 20 MG  TABS (PAROXETINE HCL) 1/2 by mouth qd  #90 x 1   Entered and Authorized by:   Marga Melnick MD   Signed by:   Marga Melnick MD on 01/26/2010   Method used:   Print then Give to Patient   RxID:   [id number] BENAZEPRIL HCL 40 MG  TABS (BENAZEPRIL HCL) 1 by mouth once daily  #90 x 3   Entered and Authorized by:   Marga Melnick MD   Signed by:   Marga Melnick MD on 01/26/2010   Method used:   Print then Give to Patient   RxID:   [id number]    Orders Added: 1)  Est. Patient Level IV [[address]]

## 2010-03-03 NOTE — Assessment & Plan Note (Signed)
Summary: sinus cold/kn   Vital Signs:  Patient profile:   70 year old male Height:      68.25 inches (173.35 cm) Weight:      221.38 pounds (100.63 kg) BMI:     33.54 O2 Sat:      95 % on Room air Temp:     98.8 degrees F (37.11 degrees C) oral Resp:     15 per minute BP sitting:   134 / 90  (left arm) Cuff size:   large  Vitals Entered By: Lucious Groves CMA (January 14, 2010 12:40 PM)  O2 Flow:  Room air CC: Sinus cold x3 weeks./kb, COPD follow-up, URI symptoms Comments Patient notes that he has been having congestion, cough with yellow mucous production, and has completed his second round of meds. He denies HA, fever, SOB, and chest pain.   CC:  Sinus cold x3 weeks./kb, COPD follow-up, and URI symptoms.  History of Present Illness:      This is a 70 year old man who presents for follow-up of bronchitis .  The patient reports wheezing, cough, and increased sputum with yellow sputum.He  denies shortness of breath, chest tightness, and nocturnal awakening.  The patient reports limitation of moderate activities.  Medication use includes controller med daily, Advair. No PMH of asthma.The patient denies nasal congestion, purulent nasal discharge, sore throat, and earache.  The patient denies fever.  The patient denies headache.  The patient denies the following risk factors for Strep sinusitis: unilateral nasal discharge and tender adenopathy.  Smoker for 15 years , < 1 ppd.  Current Medications (verified): 1)  Paroxetine Hcl 20 Mg  Tabs (Paroxetine Hcl) .... 1/2 By Mouth Qd 2)  Norvasc 10 Mg Tabs (Amlodipine Besylate) .... Take 1 Tablet By Mouth Once A Day 3)  Lipitor 20 Mg Tabs (Atorvastatin Calcium) .Marland Kitchen.. 1 By Mouth 4x/week 4)  Tenormin 50 Mg Tabs (Atenolol) .... 1/2 Bid 5)  Aspirin 81 Mg  Tbec (Aspirin) 6)  Benazepril Hcl 40 Mg  Tabs (Benazepril Hcl) .Marland Kitchen.. 1 By Mouth Once Daily 7)  Garlic 8)  Multivitamins  Tabs (Multiple Vitamin) .Marland Kitchen.. 1 By Mouth Once Daily 9)  Hydromet 5-1.5 Mg/38ml  Syrp (Hydrocodone-Homatropine) .Marland Kitchen.. 1 Tsp Every 6 Hrs 10)  Advair Diskus 250-50 Mcg/dose Aepb (Fluticasone-Salmeterol) .Marland Kitchen.. 1 Inhalation Every 12 Hrs ; Gargle & Spit After Use  Allergies (verified): No Known Drug Allergies  Review of Systems CV:  Denies chest pain or discomfort, leg cramps with exertion, and palpitations.  Physical Exam  General:  in no acute distress; alert,appropriate and cooperative throughout examination Ears:  External ear exam shows no significant lesions or deformities.  Otoscopic examination reveals  slight wax accummulations. Aids bilaterally. Nose:  External nasal examination shows no deformity or inflammation. Nasal mucosa are pink and moist without lesions or exudates. Mouth:  Oral mucosa and oropharynx without lesions or exudates.  Teeth in good repair. Mild splotchy  erythema . Very hoarse Lungs:  Normal respiratory effort, chest expands symmetrically. Lungs : localized wheezing R mid lung field posteriorly.  Thick  yellow sputum. O2 sats initially 89 % but rose to 95% within seconds Heart:  normal rate, , and irregular rhythm.   Extremities:  No clubbing, cyanosis, edema. Cervical Nodes:  No lymphadenopathy noted Axillary Nodes:  No palpable lymphadenopathy   Impression & Recommendations:  Problem # 1:  BRONCHITIS-ACUTE (ICD-466.0) partial improvement His updated medication list for this problem includes:    Hydromet 5-1.5 Mg/24ml Syrp (Hydrocodone-homatropine) .Marland KitchenMarland KitchenMarland KitchenMarland Kitchen 1  tsp every 6 hrs    Advair Diskus 250-50 Mcg/dose Aepb (Fluticasone-salmeterol) .Marland Kitchen... 1 inhalation every 12 hrs ; gargle & spit after use  Problem # 2:  REACTIVE AIRWAY DISEASE (ICD-493.90) due to #1 His updated medication list for this problem includes:    Advair Diskus 250-50 Mcg/dose Aepb (Fluticasone-salmeterol) .Marland Kitchen... 1 inhalation every 12 hrs ; gargle & spit after use  Problem # 3:  UNSPECIFIED CARDIAC DYSRHYTHMIA (ICD-427.9)  R/O AF His updated medication list for this problem  includes:    Tenormin 50 Mg Tabs (Atenolol) .Marland Kitchen... 1/2 bid    Aspirin 81 Mg Tbec (Aspirin)  Orders: EKG w/ Interpretation (93000): AF with RVR documented  Complete Medication List: 1)  Paroxetine Hcl 20 Mg Tabs (Paroxetine hcl) .... 1/2 by mouth qd 2)  Norvasc 10 Mg Tabs (Amlodipine besylate) .... Take 1 tablet by mouth once a day 3)  Lipitor 20 Mg Tabs (Atorvastatin calcium) .Marland Kitchen.. 1 by mouth 4x/week 4)  Tenormin 50 Mg Tabs (Atenolol) .... 1/2 bid 5)  Aspirin 81 Mg Tbec (Aspirin) 6)  Benazepril Hcl 40 Mg Tabs (benazepril Hcl)  .Marland Kitchen.. 1 by mouth once daily 7)  Garlic  8)  Multivitamins Tabs (Multiple vitamin) .Marland Kitchen.. 1 by mouth once daily 9)  Hydromet 5-1.5 Mg/92ml Syrp (Hydrocodone-homatropine) .Marland Kitchen.. 1 tsp every 6 hrs 10)  Advair Diskus 250-50 Mcg/dose Aepb (Fluticasone-salmeterol) .Marland Kitchen.. 1 inhalation every 12 hrs ; gargle & spit after use  Patient Instructions: 1)  Hospitalization indicated because of acute change in heart rhythm with stroke & cardiac risks as discussed. Aggressive Pulmonary Toitet necessary to treat RAD  now present .   Orders Added: 1)  Est. Patient Level IV [11914] 2)  EKG w/ Interpretation [93000]

## 2010-03-03 NOTE — Letter (Signed)
Summary: Dr. London Sheer [facility]  Dr. London Sheer [facility]   Imported By: Lanelle Bal 02/17/2010 11:42:28  _____________________________________________________________________  External Attachment:    Type:   Image     Comment:   External Document

## 2010-03-09 ENCOUNTER — Encounter: Payer: Self-pay | Admitting: Internal Medicine

## 2010-03-09 ENCOUNTER — Ambulatory Visit (INDEPENDENT_AMBULATORY_CARE_PROVIDER_SITE_OTHER): Payer: Medicare Other | Admitting: *Deleted

## 2010-03-09 DIAGNOSIS — I10 Essential (primary) hypertension: Secondary | ICD-10-CM

## 2010-03-09 DIAGNOSIS — Z7901 Long term (current) use of anticoagulants: Secondary | ICD-10-CM

## 2010-03-09 DIAGNOSIS — E7800 Pure hypercholesterolemia, unspecified: Secondary | ICD-10-CM

## 2010-03-09 DIAGNOSIS — I4891 Unspecified atrial fibrillation: Secondary | ICD-10-CM

## 2010-03-09 NOTE — Assessment & Plan Note (Addendum)
Summary: Cardiology Nuclear Testing  Nuclear Med Background Indications for Stress Test: Evaluation for Ischemia, Post Hospital  Indications Comments: 02/17/10 [facility] s/p Cardioversion ( short lived SR, back in AFIB.  History: Cardioversion, Echo, Myocardial Perfusion Study  History Comments: '07 [id number]; 12/11 Echo:EF=40-45%; h/o afib.  Symptoms: Chest Tightness, DOE, Palpitations, SOB, Vomiting  Symptoms Comments: Last CP yesterday.   Nuclear Pre-Procedure Cardiac Risk Factors: History of Smoking, Hypertension, Lipids, NIDDM Caffeine/Decaff Intake: None NPO After: 46 Lungs: Clear IV 0.9% NS with Angio Cath: 22g     IV Site: R Hand IV Started by: Bonnita Levan, RN Chest Size (in) 46     Height (in): 68.25 Weight (lb): 208 BMI: 31.51  Nuclear Med Study 1 or 2 day study:  1 day     Stress Test Type:  Treadmill/Lexiscan Reading MD:  Charlton Haws, MD     Referring MD:  Crystalyn Delia Swazil[place] Resting Radionuclide:  Technetium 62m Tetrofosmin     Resting Radionuclide Dose:  11 mCi  Stress Radionuclide:  Technetium 23m Tetrofosmin     Stress Radionuclide Dose:  33 mCi   Stress Protocol      Max HR:  115 bpm     Predicted Max HR:  150 bpm  Max Systolic BP: 142 mm Hg     Percent Max HR:  76.67 %Rate Pressure Product:  40981  Lexiscan: 0.4 mg   Stress Test Technologist:  Irean Hong,  RN     Nuclear Technologist:  Doyne Keel, CNMT  Rest Procedure  Myocardial perfusion imaging was performed at rest 45 minutes following the intravenous administration of Technetium 87m Tetrofosmin.  Stress Procedure  The patient received IV Lexiscan 0.4 mg over 15-seconds with concurrent low level exercise and then Technetium 63m Tetrofosmin was injected at 30-seconds while the patient continued walking one more minute.  The EKG was nondiagnostic due to baseline (AFIB) T wave changes, frequent PVC's,Begiminy,trigeminy. Quantitative spect images were obtained after a 45 minute delay.  QPS Raw Data Images:   Normal; no motion artifact; normal heart/lung ratio. Stress Images:  Normal homogeneous uptake in all areas of the myocardium. Rest Images:  Normal homogeneous uptake in all areas of the myocardium. Subtraction (SDS):  Normal Transient Ischemic Dilatation:  1.09  (Normal <1.22)  Lung/Heart Ratio:  0.33  (Normal <0.45)  Quantitative Gated Spect Images QGS EDV:  112 ml QGS ESV:  56 ml QGS EF:  50 % QGS cine images:  Apical hypokinesis  Findings Low risk nuclear study   Evidence for LV Dysfunction LV Dysfunction    Overall Impression  Exercise Capacity: Lexiscan with low level exercise BP Response: Normal blood pressure response. Clinical Symptoms: Dyspnea ECG Impression: Afib Overall Impression: No ishcemia or infarct.  EF may be inaccurate due to afib  Appended Document: Cardiology Nuclear Testing copy sent to Dr. Swaziland

## 2010-03-09 NOTE — Progress Notes (Signed)
Summary: nuc pre procedure  Phone Note Outgoing Call Call back at Home Phone [phone]   Call placed by: Cathlyn Parsons RN,  March 01, 2010 11:25 AM Call placed to: Patient Reason for Call: Confirm/change Appt Summary of Call: Reviewed information on Myoview Information Sheet (see scanned document for further details).  Spoke with patient.      Nuclear Med Background Indications for Stress Test: Evaluation for Ischemia, Post Hospital  Indications Comments: 02/17/10 [facility] s/p Cardioversion  History: Cardioversion, Echo, Myocardial Perfusion Study  History Comments: '07 [id number]:[id number]; 12/11 Echo:EF=40-45%; h/o afib.  Symptoms: SOB, Vomiting    Nuclear Pre-Procedure Cardiac Risk Factors: History of Smoking, Hypertension, Lipids, NIDDM Height (in): 68.25

## 2010-03-17 NOTE — Letter (Signed)
Summary: [facility]   Imported By: Maryln Gottron 03/08/2010 10:53:52  _____________________________________________________________________  External Attachment:    Type:   Image     Comment:   External Document

## 2010-03-23 NOTE — Letter (Signed)
Summary: [facility]   Imported By: Maryln Gottron 03/16/2010 12:57:06  _____________________________________________________________________  External Attachment:    Type:   Image     Comment:   External Document

## 2010-03-31 ENCOUNTER — Ambulatory Visit: Payer: Self-pay | Admitting: *Deleted

## 2010-04-06 ENCOUNTER — Ambulatory Visit (HOSPITAL_BASED_OUTPATIENT_CLINIC_OR_DEPARTMENT_OTHER): Payer: Medicare Other | Attending: Cardiology

## 2010-04-06 DIAGNOSIS — R0609 Other forms of dyspnea: Secondary | ICD-10-CM | POA: Insufficient documentation

## 2010-04-06 DIAGNOSIS — G4769 Other sleep related movement disorders: Secondary | ICD-10-CM | POA: Insufficient documentation

## 2010-04-06 DIAGNOSIS — I4891 Unspecified atrial fibrillation: Secondary | ICD-10-CM | POA: Insufficient documentation

## 2010-04-06 DIAGNOSIS — G4710 Hypersomnia, unspecified: Secondary | ICD-10-CM | POA: Insufficient documentation

## 2010-04-06 DIAGNOSIS — I4949 Other premature depolarization: Secondary | ICD-10-CM | POA: Insufficient documentation

## 2010-04-06 DIAGNOSIS — R0989 Other specified symptoms and signs involving the circulatory and respiratory systems: Secondary | ICD-10-CM | POA: Insufficient documentation

## 2010-04-11 LAB — CBC
HCT: 32 % — ABNORMAL LOW (ref 39.0–52.0)
HCT: 33.3 % — ABNORMAL LOW (ref 39.0–52.0)
HCT: 34.2 % — ABNORMAL LOW (ref 39.0–52.0)
Hemoglobin: 10.8 g/dL — ABNORMAL LOW (ref 13.0–17.0)
Hemoglobin: 11.3 g/dL — ABNORMAL LOW (ref 13.0–17.0)
Hemoglobin: 11.7 g/dL — ABNORMAL LOW (ref 13.0–17.0)
MCH: 31.5 pg (ref 26.0–34.0)
MCH: 33 pg (ref 26.0–34.0)
MCH: 33.1 pg (ref 26.0–34.0)
MCHC: 33.8 g/dL (ref 30.0–36.0)
MCHC: 33.9 g/dL (ref 30.0–36.0)
MCHC: 34.2 g/dL (ref 30.0–36.0)
MCV: 93.3 fL (ref 78.0–100.0)
MCV: 96.9 fL (ref 78.0–100.0)
MCV: 97.4 fL (ref 78.0–100.0)
Platelets: 346 10*3/uL (ref 150–400)
Platelets: 376 10*3/uL (ref 150–400)
Platelets: 385 10*3/uL (ref 150–400)
RBC: 3.42 MIL/uL — ABNORMAL LOW (ref 4.22–5.81)
RBC: 3.43 MIL/uL — ABNORMAL LOW (ref 4.22–5.81)
RDW: 15.2 % (ref 11.5–15.5)
RDW: 15.6 % — ABNORMAL HIGH (ref 11.5–15.5)
RDW: 15.7 % — ABNORMAL HIGH (ref 11.5–15.5)
WBC: 11.6 10*3/uL — ABNORMAL HIGH (ref 4.0–10.5)
WBC: 12.8 10*3/uL — ABNORMAL HIGH (ref 4.0–10.5)
WBC: 13 10*3/uL — ABNORMAL HIGH (ref 4.0–10.5)

## 2010-04-11 LAB — POCT I-STAT, CHEM 8
BUN: 10 mg/dL (ref 6–23)
Calcium, Ion: 1.13 mmol/L (ref 1.12–1.32)
Chloride: 105 mEq/L (ref 96–112)
Creatinine, Ser: 0.9 mg/dL (ref 0.4–1.5)
Glucose, Bld: 110 mg/dL — ABNORMAL HIGH (ref 70–99)
HCT: 35 % — ABNORMAL LOW (ref 39.0–52.0)
Hemoglobin: 11.9 g/dL — ABNORMAL LOW (ref 13.0–17.0)
Potassium: 4.3 mEq/L (ref 3.5–5.1)
Sodium: 137 mEq/L (ref 135–145)
TCO2: 25 mmol/L (ref 0–100)

## 2010-04-11 LAB — LIPID PANEL
Cholesterol: 119 mg/dL (ref 0–200)
HDL: 35 mg/dL — ABNORMAL LOW (ref >39)
LDL Cholesterol: 69 mg/dL (ref 0–99)
Total CHOL/HDL Ratio: 3.4 RATIO
Triglycerides: 74 mg/dL (ref <150)
VLDL: 15 mg/dL (ref 0–40)

## 2010-04-11 LAB — DIFFERENTIAL
Basophils Absolute: 0 10*3/uL (ref 0.0–0.1)
Basophils Absolute: 0 10*3/uL (ref 0.0–0.1)
Basophils Relative: 0 % (ref 0–1)
Basophils Relative: 0 % (ref 0–1)
Eosinophils Absolute: 0.1 10*3/uL (ref 0.0–0.7)
Eosinophils Absolute: 0.3 10*3/uL (ref 0.0–0.7)
Eosinophils Relative: 1 % (ref 0–5)
Eosinophils Relative: 2 % (ref 0–5)
Lymphocytes Relative: 34 % (ref 12–46)
Lymphocytes Relative: 36 % (ref 12–46)
Lymphs Abs: 4.2 10*3/uL — ABNORMAL HIGH (ref 0.7–4.0)
Lymphs Abs: 4.4 10*3/uL — ABNORMAL HIGH (ref 0.7–4.0)
Monocytes Absolute: 1.2 10*3/uL — ABNORMAL HIGH (ref 0.1–1.0)
Monocytes Absolute: 1.2 10*3/uL — ABNORMAL HIGH (ref 0.1–1.0)
Monocytes Relative: 10 % (ref 3–12)
Monocytes Relative: 10 % (ref 3–12)
Neutro Abs: 6.1 10*3/uL (ref 1.7–7.7)
Neutro Abs: 6.9 10*3/uL (ref 1.7–7.7)
Neutrophils Relative %: 53 % (ref 43–77)
Neutrophils Relative %: 54 % (ref 43–77)

## 2010-04-11 LAB — BASIC METABOLIC PANEL
BUN: 10 mg/dL (ref 6–23)
BUN: 12 mg/dL (ref 6–23)
BUN: 9 mg/dL (ref 6–23)
CO2: 23 mEq/L (ref 19–32)
CO2: 23 mEq/L (ref 19–32)
CO2: 26 mEq/L (ref 19–32)
Calcium: 8.6 mg/dL (ref 8.4–10.5)
Calcium: 8.8 mg/dL (ref 8.4–10.5)
Calcium: 9 mg/dL (ref 8.4–10.5)
Chloride: 106 mEq/L (ref 96–112)
Chloride: 108 mEq/L (ref 96–112)
Chloride: 110 mEq/L (ref 96–112)
Creatinine, Ser: 0.85 mg/dL (ref 0.4–1.5)
Creatinine, Ser: 0.87 mg/dL (ref 0.4–1.5)
Creatinine, Ser: 0.91 mg/dL (ref 0.4–1.5)
GFR calc Af Amer: >60 mL/min (ref >60)
GFR calc Af Amer: >60 mL/min (ref >60)
GFR calc Af Amer: >60 mL/min (ref >60)
GFR calc non Af Amer: >60 mL/min (ref >60)
GFR calc non Af Amer: >60 mL/min (ref >60)
GFR calc non Af Amer: >60 mL/min (ref >60)
Glucose, Bld: 107 mg/dL — ABNORMAL HIGH (ref 70–99)
Glucose, Bld: 120 mg/dL — ABNORMAL HIGH (ref 70–99)
Glucose, Bld: 122 mg/dL — ABNORMAL HIGH (ref 70–99)
Potassium: 4 mEq/L (ref 3.5–5.1)
Potassium: 4.1 mEq/L (ref 3.5–5.1)
Potassium: 4.2 mEq/L (ref 3.5–5.1)
Sodium: 137 mEq/L (ref 135–145)
Sodium: 139 mEq/L (ref 135–145)
Sodium: 141 mEq/L (ref 135–145)

## 2010-04-11 LAB — COMPREHENSIVE METABOLIC PANEL
ALT: 26 U/L (ref 0–53)
AST: 34 U/L (ref 0–37)
Albumin: 2.6 g/dL — ABNORMAL LOW (ref 3.5–5.2)
Alkaline Phosphatase: 50 U/L (ref 39–117)
BUN: 12 mg/dL (ref 6–23)
CO2: 23 mEq/L (ref 19–32)
Calcium: 8.5 mg/dL (ref 8.4–10.5)
Chloride: 105 mEq/L (ref 96–112)
Creatinine, Ser: 0.94 mg/dL (ref 0.4–1.5)
GFR calc Af Amer: >60 mL/min (ref >60)
GFR calc non Af Amer: >60 mL/min (ref >60)
Glucose, Bld: 101 mg/dL — ABNORMAL HIGH (ref 70–99)
Potassium: 4.2 mEq/L (ref 3.5–5.1)
Sodium: 138 mEq/L (ref 135–145)
Total Bilirubin: 0.5 mg/dL (ref 0.3–1.2)
Total Protein: 6.4 g/dL (ref 6.0–8.3)

## 2010-04-11 LAB — MAGNESIUM
Magnesium: 1.9 mg/dL (ref 1.5–2.5)
Magnesium: 2.1 mg/dL (ref 1.5–2.5)

## 2010-04-11 LAB — GLUCOSE, CAPILLARY
Glucose-Capillary: 107 mg/dL — ABNORMAL HIGH (ref 70–99)
Glucose-Capillary: 118 mg/dL — ABNORMAL HIGH (ref 70–99)
Glucose-Capillary: 138 mg/dL — ABNORMAL HIGH (ref 70–99)

## 2010-04-11 LAB — CULTURE, BLOOD (ROUTINE X 2)
Culture  Setup Time: 201112170337
Culture  Setup Time: 201112170337
Culture: NO GROWTH
Culture: NO GROWTH

## 2010-04-11 LAB — BRAIN NATRIURETIC PEPTIDE: Pro B Natriuretic peptide (BNP): 612 pg/mL — ABNORMAL HIGH (ref 0.0–100.0)

## 2010-04-11 LAB — TSH: TSH: 0.567 u[IU]/mL (ref 0.350–4.500)

## 2010-04-11 LAB — HEMOGLOBIN A1C
Hgb A1c MFr Bld: 6.5 % — ABNORMAL HIGH (ref <5.7)
Mean Plasma Glucose: 140 mg/dL — ABNORMAL HIGH (ref <117)

## 2010-04-11 LAB — TROPONIN I: Troponin I: 0.02 ng/mL (ref 0.00–0.06)

## 2010-04-11 LAB — T4, FREE: Free T4: 1.42 ng/dL (ref 0.80–1.80)

## 2010-04-11 LAB — PHOSPHORUS: Phosphorus: 3.5 mg/dL (ref 2.3–4.6)

## 2010-04-16 DIAGNOSIS — R0989 Other specified symptoms and signs involving the circulatory and respiratory systems: Secondary | ICD-10-CM

## 2010-04-16 DIAGNOSIS — I4891 Unspecified atrial fibrillation: Secondary | ICD-10-CM

## 2010-04-16 DIAGNOSIS — G4730 Sleep apnea, unspecified: Secondary | ICD-10-CM

## 2010-04-16 DIAGNOSIS — G4710 Hypersomnia, unspecified: Secondary | ICD-10-CM

## 2010-04-16 DIAGNOSIS — R0609 Other forms of dyspnea: Secondary | ICD-10-CM

## 2010-04-16 DIAGNOSIS — I4949 Other premature depolarization: Secondary | ICD-10-CM

## 2010-04-19 ENCOUNTER — Other Ambulatory Visit (INDEPENDENT_AMBULATORY_CARE_PROVIDER_SITE_OTHER): Payer: Medicare Other

## 2010-04-19 DIAGNOSIS — I10 Essential (primary) hypertension: Secondary | ICD-10-CM

## 2010-04-19 DIAGNOSIS — T887XXA Unspecified adverse effect of drug or medicament, initial encounter: Secondary | ICD-10-CM

## 2010-04-19 DIAGNOSIS — E119 Type 2 diabetes mellitus without complications: Secondary | ICD-10-CM

## 2010-04-19 LAB — POTASSIUM: Potassium: 4.3 mEq/L (ref 3.5–5.1)

## 2010-04-19 LAB — BUN: BUN: 15 mg/dL (ref 6–23)

## 2010-04-19 LAB — MICROALBUMIN / CREATININE URINE RATIO: Creatinine,U: 57.4 mg/dL

## 2010-04-26 ENCOUNTER — Ambulatory Visit (INDEPENDENT_AMBULATORY_CARE_PROVIDER_SITE_OTHER): Payer: Medicare Other | Admitting: Internal Medicine

## 2010-04-26 ENCOUNTER — Encounter: Payer: Self-pay | Admitting: Internal Medicine

## 2010-04-26 ENCOUNTER — Telehealth: Payer: Self-pay | Admitting: *Deleted

## 2010-04-26 VITALS — BP 124/80 | HR 56 | Wt 207.4 lb

## 2010-04-26 DIAGNOSIS — R05 Cough: Secondary | ICD-10-CM

## 2010-04-26 DIAGNOSIS — R059 Cough, unspecified: Secondary | ICD-10-CM

## 2010-04-26 DIAGNOSIS — E119 Type 2 diabetes mellitus without complications: Secondary | ICD-10-CM

## 2010-04-26 NOTE — Patient Instructions (Signed)
Please use the over-the-counter preparation Robitussin-DM as needed for the cough. If you have nasal congestion use a Neti pot. Please report fever  or purulent secretions. If the cough is persistent the benazepril will be changed to a medicine which is not associated with cough.

## 2010-04-26 NOTE — Telephone Encounter (Signed)
Notified of sleep study. Per Dr. Swaziland will refer to [facility] for sleep apnea.  Made him an app w/ Dr. Vassie Loll 4/24 at 3:45. Pt aware

## 2010-04-26 NOTE — Progress Notes (Signed)
  Subjective:    Patient ID: Samuel Moyer, male    DOB: Apr 05, 1940, 70 y.o.   MRN: [id number]  HPI   The following questions were asked concerning diabetes status. Have  you had excess thirst, excess hunger or excess urination. Have you had lightheadedness with standing, ,chest pain ; palpitations; or pain in  calves with walking.Are there any new ulcers or sores on the skin which are nonhealing especially over the feet. Has there  been a significant change in  weight. Are you  exercising. What is the value of your fasting sugar in the morning before eating ; what was the highest sugar 2 hours after a meal. Do you have numbness or tingling or burning in your feet.    fasting blood sugars range from 109 to 130 ; as high as 145. He is not checking sugars 2 hours after meals.    exercises been compromised by his recurrent atrial fib for  which production has been prescribed.   his been diagnosed with sleep apnea and is awaiting his  CPAP equipment.    he presents with an acute problem of a nonproductive cough for a week, mainly in am. He was exposed to a granddaughter who  Had apparent respiratory tract infection. He denies signs of rhinosinusitis. Specifically has no frontal headaches, facial pain, nasal purulence,dental pain, lymphadenopathy, or earache. He has no fever chills or sweats.He denies wheezing or dyspnea or significant sputum. He does have a past history of reactive airways disease : He is on ACE inhibitor (benazepril 40 mg daily ).  Review of Systems     Objective:   Physical Exam   On exam he exhibits no distress; skin is warm and dry.  He has no lymphadenopathy about the neck or axilla.   there is no suggestion of conjunctivitis. Nares are clear without exudate. Oropharynx is unremarkable. He is slightly hoarse which is his usual voice pattern.   He is wearing hearing aids bilaterally; there is some increased room and otic canals.  His chest reveals decreased breath sounds  slightly; there is no active wheezing , rales or rhonchi.   He has a slow irregular rhythm.  All pulses are intact. No carotid bruits are present.  He has no clubbing or cyanosis or edema. Nail health is good. Light touch is intact over the feet.        Assessment & Plan:   #1 diabetes well controlled   #2 dry cough without evidence of significant bacterial process. He is on an ACE inhibitor and this may need to be changed to an ARB if the symptoms persist.    Plan : #1 discontinue metformin ; recheck A1c in 4 months.   #2 Robitussin-DM as needed for cough. If the cough persists or progresses the ACE inhibitor will be discontinued and will start him prescribed. He should report signs of a bacterial infection.

## 2010-05-12 ENCOUNTER — Telehealth: Payer: Self-pay | Admitting: Internal Medicine

## 2010-05-12 MED ORDER — PAROXETINE HCL 20 MG PO TABS: 20.0000 mg | ORAL_TABLET | ORAL | Status: DC

## 2010-05-12 NOTE — Telephone Encounter (Signed)
Patient says mail order pharmacy is reporting Paroxetine will be out of stock until Mid-May----please call in a 30 day supply with one refill for Paroxetine HCL 20mg ---1/2 tab by mouth every day  to [facility], [address], [employer]

## 2010-05-20 ENCOUNTER — Encounter: Payer: Self-pay | Admitting: Pulmonary Disease

## 2010-05-24 ENCOUNTER — Encounter: Payer: Self-pay | Admitting: Pulmonary Disease

## 2010-05-24 ENCOUNTER — Ambulatory Visit (INDEPENDENT_AMBULATORY_CARE_PROVIDER_SITE_OTHER): Payer: Medicare Other | Admitting: Pulmonary Disease

## 2010-05-24 VITALS — BP 126/70 | HR 75 | Temp 98.3°F | Ht 68.0 in | Wt 213.4 lb

## 2010-05-24 DIAGNOSIS — G4731 Primary central sleep apnea: Secondary | ICD-10-CM | POA: Insufficient documentation

## 2010-05-24 DIAGNOSIS — G4733 Obstructive sleep apnea (adult) (pediatric): Secondary | ICD-10-CM

## 2010-05-24 NOTE — Patient Instructions (Signed)
We will set you up with a CPAP machine with a full face mask Send in the card a few days before your next appt This will help your cardiac issues

## 2010-05-24 NOTE — Progress Notes (Signed)
  Subjective:    Patient ID: Samuel Moyer, male    DOB: August 17, 1940, 70 y.o.   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 For some time now, his wife has noted loud snoring especially on his back & witnessed apneas. ESS 5 , denies excessive daytime somnolence  Bedtime around mN, latency < 10 mins, sleeps on his side with 2 pillows, no orthopnea or PND, wakes up at 0800, no dry mouth or morning headaches PSG 04/06/10 (wt 200) showed severe obstructive sleep apnea with AHI 66/h, nadir desatn 75% corrrected by CPAP 10 cm to AHI 4.5/h. Higher pressures were associated with emergence of central events.  Not needed advair or xopenex MDI since hosp dc  Review of Systems  Constitutional: Negative for fever, appetite change and unexpected weight change.  HENT: Positive for congestion. Negative for ear pain, sore throat, rhinorrhea, sneezing, trouble swallowing, dental problem and postnasal drip.   Eyes: Negative for redness.  Respiratory: Positive for cough. Negative for shortness of breath and wheezing.   Cardiovascular: Positive for palpitations. Negative for chest pain and leg swelling.  Gastrointestinal: Negative for nausea, vomiting, abdominal pain and diarrhea.  Genitourinary: Negative for dysuria and urgency.  Musculoskeletal: Negative for joint swelling.  Skin: Negative for rash.  Neurological: Negative for syncope and headaches.  Hematological: Does not bruise/bleed easily.  Psychiatric/Behavioral: Negative for dysphoric mood. The patient is not nervous/anxious.        Objective:   Physical Exam Gen. Pleasant, well-nourished, in no distress, normal affect ENT - no lesions, no post nasal drip, class 2 airway Neck: No JVD, no thyromegaly, no carotid bruits Lungs: no use of accessory muscles, no dullness to percussion, clear  without rales or rhonchi  Cardiovascular: Rhythm regular, heart sounds  normal, no murmurs or gallops, no peripheral edema Abdomen: soft and non-tender, no hepatosplenomegaly, BS normal. Musculoskeletal: No deformities, no cyanosis or clubbing Neuro:  alert, non focal        Assessment & Plan:

## 2010-05-25 NOTE — Assessment & Plan Note (Signed)
INitiate CPAP 10 cm with fullf ace ,mask & humidity. Check download in 62m onth Given excessive daytime somnolence, narrow pharyngeal exam, witnessed apneas & loud snoring, obstructive sleep apnea is very likely & an overnight polysomnogram will be scheduled as a split study. The pathophysiology of obstructive sleep apnea , it's cardiovascular consequences & modes of treatment including CPAP were discused with the patient in detail & they evidenced understanding. Weight loss encouraged, compliance with goal of at least 4-6 hrs every night is the expectation. Advised against medications with sedative side effects Cautioned against driving when sleepy - understanding that sleepiness will vary on a day to day basis

## 2010-05-31 ENCOUNTER — Telehealth: Payer: Self-pay | Admitting: Cardiology

## 2010-05-31 NOTE — Telephone Encounter (Signed)
Fax: [phone] prior to 04/06/10

## 2010-06-30 ENCOUNTER — Encounter: Payer: Self-pay | Admitting: Gastroenterology

## 2010-07-01 ENCOUNTER — Ambulatory Visit: Payer: Medicare Other | Admitting: Gastroenterology

## 2010-07-04 ENCOUNTER — Ambulatory Visit: Payer: Medicare Other | Admitting: Pulmonary Disease

## 2010-07-13 ENCOUNTER — Encounter: Payer: Self-pay | Admitting: Internal Medicine

## 2010-07-13 ENCOUNTER — Ambulatory Visit (INDEPENDENT_AMBULATORY_CARE_PROVIDER_SITE_OTHER): Payer: Medicare Other | Admitting: Internal Medicine

## 2010-07-13 DIAGNOSIS — I4891 Unspecified atrial fibrillation: Secondary | ICD-10-CM

## 2010-07-13 DIAGNOSIS — J209 Acute bronchitis, unspecified: Secondary | ICD-10-CM

## 2010-07-13 DIAGNOSIS — G4733 Obstructive sleep apnea (adult) (pediatric): Secondary | ICD-10-CM

## 2010-07-13 MED ORDER — AZITHROMYCIN 250 MG PO TABS: 250.0000 mg | ORAL_TABLET | Freq: Every day | ORAL | Status: AC

## 2010-07-13 MED ORDER — FLUTICASONE-SALMETEROL 250-50 MCG/DOSE IN AEPB: 1.0000 | INHALATION_SPRAY | Freq: Two times a day (BID) | RESPIRATORY_TRACT | Status: DC

## 2010-07-13 NOTE — Progress Notes (Signed)
  Subjective:    Patient ID: Samuel Moyer, male    DOB: 10-06-1940, 70 y.o.   MRN: [id number]  HPI Respiratory tract infection Onset/symptoms:3 weeks ago  as  Cough with yellow Exposures (illness/environmental/extrinsic):granddaugher was ill Progression of symptoms:better today Treatments/response:none Present symptoms:productive cough Fever/chills/sweats:no Frontal headache:no Facial pain:no Nasal purulence:clear secretions only Sore throat:no Dental pain:no Lymphadenopathy:no Wheezing/shortness of breath:wheezing in past 3-4 days Cough/sputum/hemoptysis:? Traces of blood 06/12 & today.he is on Pradaxa Pleuritic pain:no Associated extrinsic/allergic symptoms:itchy eyes/ sneezing:no Past medical history: Seasonal allergies/asthma:no Smoking history:quit 1979 PMH : CHF 12/2010            Review of Systems on CPAP X 6 weeks     Objective:   Physical Exam General appearance is of good health and nourishment; no acute distress or increased work of breathing is present.  No  lymphadenopathy about the head, neck, or axilla noted.   Eyes: No conjunctival inflammation or lid edema is present. There is no scleral icterus.  Ears:  External ear exam shows no significant lesions or deformities.  Otoscopic examination reveals clear canals, tympanic membranes are intact bilaterally without bulging, retraction, inflammation or discharge. Hearing aids bilaterally  Nose:  External nasal examination shows no deformity or inflammation. Nasal mucosa are pink and moist without lesions or exudates. Septal  Deviation to R..Decreased R nare  airflow.   Oral exam: Dental hygiene is good; lips and gums are healthy appearing.There is no oropharyngeal erythema or exudate noted.   Neck:  No deformities, thyromegaly, masses, or tenderness noted.   No NVD  Heart:  Slow irregular rhythm. Without gallop, murmur, click, rub or other extra sounds.   Lungs: no wheezes; mild rhonchi & rales  RLL > LLL.No  increased work of breathing.   Abdomen: bowel sounds normal, soft and non-tender without masses, organomegaly or hernias noted.  No guarding or rebound. No HJR Extremities:  No cyanosis or clubbing  noted . TRace -1/2 + edema   Skin: Warm & dry w/o jaundice or tenting.          Assessment & Plan:  #1 bronchitis with RAD #2 ? Trace hemoptysis in context of Pradaxa Rx #3 PMH of CHF; clinically not present #4 AF with slow venricular  response

## 2010-07-13 NOTE — Patient Instructions (Addendum)
Plain Mucinex for thick secretions ; force NON dairy fluids for next 48 hrs . Use Advair sample. Chest Xray Fri if no better

## 2010-07-22 ENCOUNTER — Ambulatory Visit (INDEPENDENT_AMBULATORY_CARE_PROVIDER_SITE_OTHER): Payer: Medicare Other | Admitting: Pulmonary Disease

## 2010-07-22 ENCOUNTER — Encounter: Payer: Self-pay | Admitting: Pulmonary Disease

## 2010-07-22 DIAGNOSIS — J45909 Unspecified asthma, uncomplicated: Secondary | ICD-10-CM

## 2010-07-22 DIAGNOSIS — G4733 Obstructive sleep apnea (adult) (pediatric): Secondary | ICD-10-CM

## 2010-07-22 NOTE — Patient Instructions (Signed)
Your CPAP is set at 10 cm. Stay on advair x 2 weeks & can stop once congestion gone.

## 2010-07-22 NOTE — Progress Notes (Signed)
  Subjective:    Patient ID: Samuel Moyer, male    DOB: [birth year], 70 y.o.   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  For some time now, his wife has noted loud snoring especially on his back & witnessed apneas.  ESS 5 , denies excessive daytime somnolence  Bedtime around mN, latency < 10 mins, sleeps on his side with 2 pillows, no orthopnea or PND, wakes up at 0800, no dry mouth or morning headaches  PSG 04/06/10 (wt 200) showed severe obstructive sleep apnea with AHI 66/h, nadir desatn 75% corrrected by CPAP 10 cm to AHI 4.5/h. Higher pressures were associated with emergence of central events.   Not needed advair or xopenex MDI since hosp dc   07/22/2010 Chest cold  >> zpak, back on advair, clear phlegm now Pedal edema - on amlodepin Reviewed CPAP report. Pt says he has stopped snoring on CPAP but still nods off during the day. No mask problems Download 4/30 -07/18/10 >>excellent usage 7h, residual AI 11/h    Review of Systems Pt denies any significant  nasal congestion or excess secretions, fever, chills, sweats, unintended wt loss, pleuritic or exertional cp, orthopnea pnd or leg swelling.  Pt also denies any obvious fluctuation in symptoms with weather or environmental change or other alleviating or aggravating factors.    Pt denies any increase in rescue therapy over baseline, denies waking up needing it or having early am exacerbations or coughing/wheezing/ or dyspnea       Objective:   Physical Exam Gen. Pleasant, well-nourished, in no distress ENT - no lesions, no post nasal drip Neck: No JVD, no thyromegaly, no carotid bruits Lungs: no use of accessory muscles, no dullness to percussion, clear without rales or rhonchi  Cardiovascular: Rhythm regular, heart sounds  normal, no murmurs or  gallops, no peripheral edema Musculoskeletal: No deformities, no cyanosis or clubbing         Assessment & Plan:

## 2010-07-25 ENCOUNTER — Encounter: Payer: Self-pay | Admitting: Pulmonary Disease

## 2010-07-25 NOTE — Assessment & Plan Note (Signed)
OK to taper advair to off once current episode resolved. Estimate pulm function if symptoms persist

## 2010-07-25 NOTE — Assessment & Plan Note (Signed)
Ct cpap 10 cm - acceptable results on download Weight loss encouraged, compliance with goal of at least 4-6 hrs every night is the expectation. Advised against medications with sedative side effects Cautioned against driving when sleepy - understanding that sleepiness will vary on a day to day basis

## 2010-08-10 ENCOUNTER — Ambulatory Visit (INDEPENDENT_AMBULATORY_CARE_PROVIDER_SITE_OTHER): Payer: Medicare Other | Admitting: Gastroenterology

## 2010-08-10 ENCOUNTER — Encounter: Payer: Self-pay | Admitting: Gastroenterology

## 2010-08-10 VITALS — BP 126/64 | HR 72 | Ht 68.0 in | Wt 202.0 lb

## 2010-08-10 DIAGNOSIS — Z1211 Encounter for screening for malignant neoplasm of colon: Secondary | ICD-10-CM

## 2010-08-10 DIAGNOSIS — Z8371 Family history of colonic polyps: Secondary | ICD-10-CM

## 2010-08-10 DIAGNOSIS — Z7901 Long term (current) use of anticoagulants: Secondary | ICD-10-CM

## 2010-08-10 NOTE — Progress Notes (Signed)
History of Present Illness: This is a 70 year old male family history of colon polyps in both his father and sister. He underwent colonoscopy in May 2006 with 2 small hyperplastic colon polyps removed. He developed atrial fibrillation and was placed on Pradaxa several months ago when hospitalized with pneumonia. He was also diagnosed with sleep apnea and is maintained on CPAP and followed by Dr. Vassie Loll. He has stable hyperlipidemia and hypertension.  Past Medical History  Diagnosis Date  . Anxiety   . Claustrophobia     Occasionally when flying   . Hypertension   . Hyperlipidemia   . OSA (obstructive sleep apnea)     CPAP machine   . Atrial fibrillation   . Colitis   . Hemorrhoids   . Diabetes mellitus, type 2   . Arrhythmia    History reviewed. No pertinent past surgical history.  reports that he quit smoking about 33 years ago. His smoking use included Cigarettes. He has a 16 pack-year smoking history. He has never used smokeless tobacco. He reports that he does not drink alcohol or use illicit drugs. family history includes Colonic polyp in his sister; Heart attack in his father; and Hypertension in his father.  There is no history of Colon cancer. No Known Allergies  Outpatient Encounter Prescriptions as of 08/10/2010  Medication Sig Dispense Refill  . amLODipine (NORVASC) 10 MG tablet Take 10 mg by mouth daily.        Marland Kitchen atorvastatin (LIPITOR) 10 MG tablet Take 10 mg by mouth daily. 1 by mouth 4 x a week       . benazepril (LOTENSIN) 40 MG tablet Take 40 mg by mouth daily.        . Dabigatran Etexilate Mesylate (PRADAXA) 150 MG CAPS Take 150 mg by mouth every 12 (twelve) hours.        . digoxin (LANOXIN) 0.125 MG tablet Take 125 mcg by mouth daily.        . furosemide (LASIX) 20 MG tablet Take 20 mg by mouth daily.       . metoprolol (LOPRESSOR) 25 MG tablet Take 25 mg by mouth. 3 by mouth twice daily      . Multiple Vitamin (MULTIVITAMIN) tablet Take 1 tablet by mouth daily.          Marland Kitchen PARoxetine (PAXIL) 20 MG tablet Take 10 mg by mouth every morning.        Marland Kitchen ALPRAZolam (XANAX) 0.25 MG tablet Take 0.25 mg by mouth. 1/2 by mouth two times daily       . Fluticasone-Salmeterol (ADVAIR DISKUS) 250-50 MCG/DOSE AEPB Inhale 1 puff into the lungs 2 (two) times daily. 1 inhalation every 12 hours, gargle and spit after use  60 each  1   Review of Systems: Pertinent positive and negative review of systems were noted in the above HPI section. All other review of systems were otherwise negative.  Physical Exam: General: Well developed , well nourished, no acute distress Head: Normocephalic and atraumatic Eyes:  sclerae anicteric, EOMI Ears: Normal auditory acuity Mouth: No deformity or lesions Neck: Supple, no masses or thyromegaly Lungs: Clear throughout to auscultation Heart: Regular rate and rhythm; no murmurs, rubs or bruits Abdomen: Soft, non tender and non distended. No masses, hepatosplenomegaly or hernias noted. Normal Bowel sounds Rectal: Deferred to colonoscopy Musculoskeletal: Symmetrical with no gross deformities  Skin: No lesions on visible extremities Pulses:  Normal pulses noted Extremities: No clubbing, cyanosis, edema or deformities noted Neurological: Alert oriented x 4, grossly  nonfocal Cervical Nodes:  No significant cervical adenopathy Inguinal Nodes: No significant inguinal adenopathy Psychological:  Alert and cooperative. Normal mood and affect  Assessment and Recommendations:  1. Colorectal cancer screening: elevated risk with his father and sister with colon polyps. He is maintained on Pradaxa for atrial fibrillation and has sleep apnea requiring CPAP. Obtain clearance from his cardiologist for a temporary hold on Pradaxa to allow for possible biopsy possible polypectomy at colonoscopy. I asked him to bring his CPAP for the colonoscopy. The risks, benefits, and alternatives to colonoscopy with possible biopsy and possible polypectomy were discussed with  the patient and they consent to proceed.

## 2010-08-10 NOTE — Patient Instructions (Addendum)
We will contact you when the September schedules comes out so we can schedule your Colonoscopy. I will go ahead and send a letter to your Cardiologist regarding your Pradaxa clearance.  cc: Marga Melnick, MD        Marca Ancona, MD

## 2010-08-12 ENCOUNTER — Other Ambulatory Visit: Payer: Self-pay | Admitting: Cardiology

## 2010-08-12 NOTE — Telephone Encounter (Signed)
escribe medication per fax request  

## 2010-08-18 NOTE — Progress Notes (Signed)
Patient of Dr. Elvis Coil, will forward to him.

## 2010-08-19 ENCOUNTER — Telehealth: Payer: Self-pay

## 2010-08-19 NOTE — Telephone Encounter (Signed)
This was forwarded by Dr. Swaziland with clearance for Pradaxa hold.  ----- Message ----- From: Peter Swaziland, MD Sent: 08/19/2010 8:56 AM To: Marca Ancona, MD, Meryl Dare, MD,FACG Subject: anticoagulation   Mr. Goddard can come off his Pradaxa for colonoscopy, then resume. Peter Swaziland  ----- Message ----- From: Marca Ancona, MD Sent: 08/18/2010 4:48 PM To: Meryl Dare, MD,FACG, Peter Swaziland, MD

## 2010-08-19 NOTE — Telephone Encounter (Signed)
Spoke with patient and informed him that Shirlee Latch states it's fine for him to hold his Pradaxa prior to the procedure. Told patient that he could schedule his procedure with me and patient states he will call back to schedule.

## 2010-08-25 ENCOUNTER — Other Ambulatory Visit: Payer: Self-pay | Admitting: Internal Medicine

## 2010-08-25 DIAGNOSIS — E119 Type 2 diabetes mellitus without complications: Secondary | ICD-10-CM

## 2010-08-26 ENCOUNTER — Other Ambulatory Visit (INDEPENDENT_AMBULATORY_CARE_PROVIDER_SITE_OTHER): Payer: Medicare Other

## 2010-08-26 DIAGNOSIS — E119 Type 2 diabetes mellitus without complications: Secondary | ICD-10-CM

## 2010-08-26 NOTE — Progress Notes (Signed)
Labs only

## 2010-09-22 ENCOUNTER — Encounter: Payer: Self-pay | Admitting: Cardiology

## 2010-10-05 ENCOUNTER — Other Ambulatory Visit: Payer: Self-pay | Admitting: Internal Medicine

## 2010-10-07 ENCOUNTER — Encounter: Payer: Self-pay | Admitting: Cardiology

## 2010-10-07 ENCOUNTER — Ambulatory Visit (INDEPENDENT_AMBULATORY_CARE_PROVIDER_SITE_OTHER): Payer: Medicare Other | Admitting: Cardiology

## 2010-10-07 DIAGNOSIS — I5022 Chronic systolic (congestive) heart failure: Secondary | ICD-10-CM

## 2010-10-07 DIAGNOSIS — I4891 Unspecified atrial fibrillation: Secondary | ICD-10-CM

## 2010-10-07 DIAGNOSIS — I10 Essential (primary) hypertension: Secondary | ICD-10-CM

## 2010-10-07 MED ORDER — METOPROLOL TARTRATE 25 MG PO TABS: 75.0000 mg | ORAL_TABLET | Freq: Two times a day (BID) | ORAL | Status: DC

## 2010-10-07 MED ORDER — BENAZEPRIL HCL 40 MG PO TABS: 40.0000 mg | ORAL_TABLET | Freq: Every day | ORAL | Status: DC

## 2010-10-07 MED ORDER — FUROSEMIDE 20 MG PO TABS: 20.0000 mg | ORAL_TABLET | Freq: Every day | ORAL | Status: DC

## 2010-10-07 MED ORDER — DIGOXIN 125 MCG PO TABS: 125.0000 ug | ORAL_TABLET | Freq: Every day | ORAL | Status: DC

## 2010-10-07 MED ORDER — AMLODIPINE BESYLATE 10 MG PO TABS: 10.0000 mg | ORAL_TABLET | Freq: Every day | ORAL | Status: DC

## 2010-10-07 NOTE — Patient Instructions (Signed)
Continue your current medications.  You may stop Pradaxa 2-3 days before colonoscopy.  I will see you again in 6 months.

## 2010-10-07 NOTE — Assessment & Plan Note (Signed)
Blood pressure is under excellent control today. 

## 2010-10-07 NOTE — Progress Notes (Signed)
Samuel Moyer Date of Birth: July 21, 1940   History of Present Illness: Samuel Moyer is seen today for followup. He states that he has been feeling very well. Since his last visit here he has been diagnosed and treated for obstructive sleep apnea. He is no longer snoring. He denies any fatigue or shortness of breath. He denies any chest pain. He has had no significant palpitations or dizziness. He is planning to have a colonoscopy in the near future.  Current Outpatient Prescriptions on File Prior to Visit  Medication Sig Dispense Refill  . ALPRAZolam (XANAX) 0.25 MG tablet Take 0.25 mg by mouth. 1/2 by mouth two times daily       . atorvastatin (LIPITOR) 10 MG tablet Take 10 mg by mouth daily. 1 by mouth 4 x a week       . Multiple Vitamin (MULTIVITAMIN) tablet Take 1 tablet by mouth daily.        Marland Kitchen PARoxetine (PAXIL) 20 MG tablet Take 10 mg by mouth every morning.        Marland Kitchen PRADAXA 150 MG CAPS TAKE 1 CAPSULE TWICE A DAY  60 capsule  5  . DISCONTD: amLODipine (NORVASC) 10 MG tablet TAKE 1 TABLET BY MOUTH ONCE A DAY  90 tablet  1  . DISCONTD: benazepril (LOTENSIN) 40 MG tablet Take 40 mg by mouth daily.        Marland Kitchen DISCONTD: digoxin (LANOXIN) 0.125 MG tablet TAKE 1 TABLET EVERY DAY  30 tablet  5  . DISCONTD: furosemide (LASIX) 20 MG tablet TAKE 1 TABLET EVERY DAY  30 tablet  5  . DISCONTD: metoprolol tartrate (LOPRESSOR) 25 MG tablet TAKE 3 TABLETS TWICE A DAY  180 tablet  5  . Fluticasone-Salmeterol (ADVAIR DISKUS) 250-50 MCG/DOSE AEPB Inhale 1 puff into the lungs 2 (two) times daily. 1 inhalation every 12 hours, gargle and spit after use  60 each  1    No Known Allergies  Past Medical History  Diagnosis Date  . Anxiety   . Claustrophobia     Occasionally when flying   . Hypertension   . Hyperlipidemia   . OSA (obstructive sleep apnea)     CPAP machine   . Atrial fibrillation   . Colitis   . Hemorrhoids   . Diabetes mellitus, type 2   . Arrhythmia   . PVC's (premature ventricular  contractions)   . LV dysfunction     Past Surgical History  Procedure Date  . US echocardiography 11/15/2009    EF 40-45%  . Cardiovascular stress test 03/02/2010    EF 50%    History  Smoking status  . Former Smoker -- 1.0 packs/day for 16 years  . Types: Cigarettes  . Quit date: 06/30/1977  Smokeless tobacco  . Never Used    History  Alcohol Use No    Family History  Problem Relation Age of Onset  . Hypertension Father   . Heart attack Father     Age 54 (MI)  . Heart failure Father   . Colonic polyp Sister     and Father  . Colon cancer Neg Hx   . Stroke Mother     Review of Systems: As noted in history of present illness..  All other systems were reviewed and are negative.  Physical Exam: BP 128/72  Pulse 60  Ht 5\' 8"  (1.727 m)  Wt 199 lb 3.2 oz (90.357 kg)  BMI 30.29 kg/m2 The patient is alert and oriented x 3.  The  mood and affect are normal.  The skin is warm and dry.  Color is normal.  The HEENT exam reveals that the sclera are nonicteric.  The mucous membranes are moist.  The carotids are 2+ without bruits.  There is no thyromegaly.  There is no JVD.  The lungs are clear.  The chest wall is non tender.  The heart exam reveals a regular rate with a normal S1 and S2.  There are no murmurs, gallops, or rubs.  The PMI is not displaced.   Abdominal exam reveals good bowel sounds.  There is no guarding or rebound.  There is no hepatosplenomegaly or tenderness.  There are no masses.  Exam of the legs reveal no clubbing, cyanosis, or edema.  The legs are without rashes.  The distal pulses are intact.  Cranial nerves II - XII are intact.  Motor and sensory functions are intact.  The gait is normal. LABORATORY DATA:   Assessment / Plan:

## 2010-10-07 NOTE — Assessment & Plan Note (Signed)
He is well compensated and asymptomatic. He will continue with metoprolol, digoxin, Lasix, and the ACE inhibitor.

## 2010-10-07 NOTE — Assessment & Plan Note (Signed)
His rate is well-controlled. He is on therapeutic anticoagulation. He is asymptomatic. We will continue long-term therapy with rate control and anticoagulation.

## 2010-11-10 ENCOUNTER — Other Ambulatory Visit: Payer: Self-pay | Admitting: *Deleted

## 2010-11-10 MED ORDER — DABIGATRAN ETEXILATE MESYLATE 150 MG PO CAPS: 150.0000 mg | ORAL_CAPSULE | Freq: Two times a day (BID) | ORAL | Status: DC

## 2010-11-16 ENCOUNTER — Other Ambulatory Visit: Payer: Self-pay | Admitting: Internal Medicine

## 2010-11-16 NOTE — Telephone Encounter (Signed)
LIPID/HEP 272.4/995.20  

## 2010-12-06 ENCOUNTER — Telehealth: Payer: Self-pay | Admitting: *Deleted

## 2010-12-06 NOTE — Telephone Encounter (Signed)
Pt wife left VM that she and husband are planning to go out the country on Monday to [place] for 2 weeks. Pt wife would like to know if there are any preparation that her husband needs to do in regard to his health since he is on blood thinner .Please advise

## 2010-12-06 NOTE — Telephone Encounter (Signed)
See note in his  wife concerning Pepto-Bismol tablets and avoiding fruits and water in the local community

## 2010-12-06 NOTE — Telephone Encounter (Signed)
Discuss with patient wife 

## 2010-12-08 ENCOUNTER — Telehealth: Payer: Self-pay | Admitting: Cardiology

## 2010-12-08 NOTE — Telephone Encounter (Signed)
Called stating he will be flying to [place] and his PCP told him to call us to see if he needed to adjust any of his medications due to high altitude. He states he feels fine now. Is on Pradaxa but advised to get up and stretch legs. Spoke w/Dr.Nahser (DOD) and he said he didn't adjust any meds. Lm regarding above.

## 2010-12-08 NOTE — Telephone Encounter (Signed)
New medication:  Going to be traveling at a high altitude and wants to know if medication changes are needed or labwork needed?  Please call patient at [phone].

## 2011-01-07 ENCOUNTER — Ambulatory Visit (INDEPENDENT_AMBULATORY_CARE_PROVIDER_SITE_OTHER): Payer: Medicare Other

## 2011-01-07 DIAGNOSIS — R059 Cough, unspecified: Secondary | ICD-10-CM

## 2011-01-07 DIAGNOSIS — R509 Fever, unspecified: Secondary | ICD-10-CM

## 2011-01-07 DIAGNOSIS — R05 Cough: Secondary | ICD-10-CM

## 2011-01-17 ENCOUNTER — Other Ambulatory Visit: Payer: Self-pay | Admitting: Internal Medicine

## 2011-01-17 DIAGNOSIS — E785 Hyperlipidemia, unspecified: Secondary | ICD-10-CM

## 2011-01-17 DIAGNOSIS — T887XXA Unspecified adverse effect of drug or medicament, initial encounter: Secondary | ICD-10-CM

## 2011-01-18 ENCOUNTER — Other Ambulatory Visit (INDEPENDENT_AMBULATORY_CARE_PROVIDER_SITE_OTHER): Payer: Medicare Other

## 2011-01-18 ENCOUNTER — Other Ambulatory Visit: Payer: Self-pay | Admitting: Internal Medicine

## 2011-01-18 DIAGNOSIS — E785 Hyperlipidemia, unspecified: Secondary | ICD-10-CM

## 2011-01-18 DIAGNOSIS — T887XXA Unspecified adverse effect of drug or medicament, initial encounter: Secondary | ICD-10-CM

## 2011-01-18 LAB — HEPATIC FUNCTION PANEL
Alkaline Phosphatase: 63 U/L (ref 39–117)
Bilirubin, Direct: 0 mg/dL (ref 0.0–0.3)
Total Bilirubin: 0.4 mg/dL (ref 0.3–1.2)
Total Protein: 7.1 g/dL (ref 6.0–8.3)

## 2011-01-18 LAB — LIPID PANEL
Cholesterol: 154 mg/dL (ref 0–200)
LDL Cholesterol: 86 mg/dL (ref 0–99)

## 2011-01-18 MED ORDER — ATORVASTATIN CALCIUM 10 MG PO TABS: 10.0000 mg | ORAL_TABLET | Freq: Every day | ORAL | Status: DC

## 2011-01-18 NOTE — Telephone Encounter (Signed)
RX sent

## 2011-01-23 ENCOUNTER — Ambulatory Visit (INDEPENDENT_AMBULATORY_CARE_PROVIDER_SITE_OTHER): Payer: Medicare Other | Admitting: Internal Medicine

## 2011-01-23 ENCOUNTER — Encounter: Payer: Self-pay | Admitting: Internal Medicine

## 2011-01-23 VITALS — BP 134/88 | HR 71 | Temp 98.5°F | Wt 195.4 lb

## 2011-01-23 DIAGNOSIS — R05 Cough: Secondary | ICD-10-CM

## 2011-01-23 DIAGNOSIS — R059 Cough, unspecified: Secondary | ICD-10-CM

## 2011-01-23 MED ORDER — ATORVASTATIN CALCIUM 10 MG PO TABS: 10.0000 mg | ORAL_TABLET | Freq: Every day | ORAL | Status: DC

## 2011-01-23 MED ORDER — LEVOFLOXACIN 500 MG PO TABS: 500.0000 mg | ORAL_TABLET | Freq: Every day | ORAL | Status: AC

## 2011-01-23 NOTE — Progress Notes (Signed)
  Subjective:    Patient ID: Samuel Moyer, male    DOB: 23-Mar-1940, 70 y.o.   MRN: [id number]  HPI Respiratory tract infection Onset/symptoms:12/4 as cough , rhinitis Exposures (illness/environmental/extrinsic):granddaughters ill Treatments/response:Cefdinir  X 10 days ( 12/8-18) from UC with response , but cough recurred in past 3 days; cough better now . Cepachol controls cough Present symptoms: Fever/chills/sweats:no Frontal headache:no Facial pain:no Nasal purulence:scant yellow Sore throat:minor Lymphadenopathy:no Wheezing/shortness of breath:slight wheezing Cough/sputum/hemoptysis:scant yellow Pleuritic pain:no PMH: PNA 2011; no asthma           Review of Systems     Objective:   Physical Exam General appearance is of good health and nourishment; no acute distress or increased work of breathing is present.  No  lymphadenopathy about the head, neck, or axilla noted.   Eyes: No conjunctival inflammation or lid edema is present.   Ears:  External ear exam shows no significant lesions or deformities.  Otoscopic examination reveals clear canals, tympanic membranes are intact bilaterally without bulging, retraction, inflammation or discharge.  Nose:  External nasal examination shows no deformity or inflammation. Nasal mucosa are dry without lesions or exudates. No septal dislocation .No obstruction to airflow.   Oral exam: Dental hygiene is good; lips and gums are healthy appearing.There is no oropharyngeal erythema or exudate noted.     Heart:  Normal rate and regular rhythm. S1 and S2 normal without gallop, murmur, click, rub or other extra sounds.   Lungs:Chest clear to auscultation; no wheezes, rhonchi,rales ,or rubs present.No increased work of breathing.    Extremities:  No cyanosis, edema, or clubbing  noted    Skin: Warm & dry          Assessment & Plan:   #1 cough with scant purulent secretions. Status post 10 days of second-generation cephalosporin.  He is mainly here because of the holidays. I'll  write a prescription for Levaquin if the symptoms progress over the holidays

## 2011-01-23 NOTE — Patient Instructions (Signed)
Zicam Melts or Zinc lozenges ; vitamin C 2000 mg daily; & Echinacea for 4-7 days. Fill the generic Levaquin for fever, exudate("pus") or shortness of breath

## 2011-01-23 NOTE — Progress Notes (Signed)
Addended byPecola Lawless on: 01/23/2011 02:39 PM   Modules accepted: Orders

## 2011-02-16 ENCOUNTER — Other Ambulatory Visit: Payer: Self-pay | Admitting: Internal Medicine

## 2011-03-02 ENCOUNTER — Other Ambulatory Visit: Payer: Self-pay | Admitting: Internal Medicine

## 2011-03-10 ENCOUNTER — Telehealth: Payer: Self-pay | Admitting: Gastroenterology

## 2011-03-10 NOTE — Telephone Encounter (Signed)
Clearance is in the chart for ok to hold Pradaxa.  See phone notes from 08/19/10.  He does need his cpap for the procedure and a pre-visit.  Please put in the pre-visit notes to refer to phone note from 07/2010 on pradaxa directions

## 2011-03-20 ENCOUNTER — Ambulatory Visit (AMBULATORY_SURGERY_CENTER): Payer: Medicare Other | Admitting: *Deleted

## 2011-03-20 VITALS — Ht 68.0 in | Wt 197.6 lb

## 2011-03-20 DIAGNOSIS — Z1211 Encounter for screening for malignant neoplasm of colon: Secondary | ICD-10-CM

## 2011-03-20 MED ORDER — PEG-KCL-NACL-NASULF-NA ASC-C 100 G PO SOLR: ORAL | Status: DC

## 2011-03-28 ENCOUNTER — Encounter: Payer: Self-pay | Admitting: Pulmonary Disease

## 2011-03-28 ENCOUNTER — Ambulatory Visit (INDEPENDENT_AMBULATORY_CARE_PROVIDER_SITE_OTHER): Payer: Medicare Other | Admitting: Pulmonary Disease

## 2011-03-28 ENCOUNTER — Other Ambulatory Visit: Payer: Self-pay | Admitting: Cardiology

## 2011-03-28 DIAGNOSIS — G4733 Obstructive sleep apnea (adult) (pediatric): Secondary | ICD-10-CM | POA: Diagnosis not present

## 2011-03-28 NOTE — Patient Instructions (Signed)
Your BP is running high today - chk this again at home Change filters on your cpap every  

## 2011-03-28 NOTE — Progress Notes (Signed)
  Subjective:    Patient ID: Samuel Moyer, male    DOB: [birth year], 71 y.o.   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  PSG 04/06/10 (wt 200) showed severe obstructive sleep apnea with AHI 66/h, nadir desatn 75% corrrected by CPAP 10 cm to AHI 4.5/h. Higher pressures were associated with emergence of central events.  Not needed advair or xopenex MDI since hosp dc   Download 4/30 -07/18/10 >>excellent usage 7h, residual AI 11/h   03/28/2011 Travelled to [place] States using cpap 7-8 hours every night. Mask fits ok, states got a new seal x 1 month ago. On advair, benazepril Download shows good compliance, residual ahi 11/h He denies daytime somnolence, mask ok, pressure ok, no snoring     Review of Systems Patient denies significant dyspnea,cough, hemoptysis,  chest pain, palpitations, pedal edema, orthopnea, paroxysmal nocturnal dyspnea, lightheadedness, nausea, vomiting, abdominal or  leg pains      Objective:   Physical Exam  Gen. Pleasant, well-nourished, in no distress ENT - no lesions, no post nasal drip Neck: No JVD, no thyromegaly, no carotid bruits Lungs: no use of accessory muscles, no dullness to percussion, clear without rales or rhonchi  Cardiovascular: Rhythm regular, heart sounds  normal, no murmurs or gallops, no peripheral edema Musculoskeletal: No deformities, no cyanosis or clubbing        Assessment & Plan:

## 2011-03-29 ENCOUNTER — Ambulatory Visit: Payer: Medicare Other | Admitting: Gastroenterology

## 2011-03-29 NOTE — Assessment & Plan Note (Signed)
Weight loss encouraged, compliance with goal of at least 4-6 hrs every night is the expectation. Advised against medications with sedative side effects Cautioned against driving when sleepy - understanding that sleepiness will vary on a day to day basis  

## 2011-03-30 ENCOUNTER — Encounter: Payer: Self-pay | Admitting: Gastroenterology

## 2011-03-30 ENCOUNTER — Ambulatory Visit (AMBULATORY_SURGERY_CENTER): Payer: Medicare Other | Admitting: Gastroenterology

## 2011-03-30 VITALS — BP 145/80 | HR 58 | Temp 98.3°F | Resp 21 | Ht 68.0 in | Wt 197.0 lb

## 2011-03-30 DIAGNOSIS — F411 Generalized anxiety disorder: Secondary | ICD-10-CM | POA: Diagnosis not present

## 2011-03-30 DIAGNOSIS — Z8371 Family history of colonic polyps: Secondary | ICD-10-CM

## 2011-03-30 DIAGNOSIS — G4733 Obstructive sleep apnea (adult) (pediatric): Secondary | ICD-10-CM | POA: Diagnosis not present

## 2011-03-30 DIAGNOSIS — I4891 Unspecified atrial fibrillation: Secondary | ICD-10-CM | POA: Diagnosis not present

## 2011-03-30 DIAGNOSIS — I10 Essential (primary) hypertension: Secondary | ICD-10-CM | POA: Diagnosis not present

## 2011-03-30 DIAGNOSIS — Z1211 Encounter for screening for malignant neoplasm of colon: Secondary | ICD-10-CM | POA: Diagnosis not present

## 2011-03-30 MED ORDER — SODIUM CHLORIDE 0.9 % IV SOLN: 500.0000 mL | INTRAVENOUS | Status: DC

## 2011-03-30 NOTE — Patient Instructions (Signed)
YOU HAD AN ENDOSCOPIC PROCEDURE TODAY AT THE [facility]: Refer to the procedure report that was given to you for any specific questions about what was found during the examination.  If the procedure report does not answer your questions, please call your gastroenterologist to clarify.  If you requested that your care partner not be given the details of your procedure findings, then the procedure report has been included in a sealed envelope for you to review at your convenience later.  YOU SHOULD EXPECT: Some feelings of bloating in the abdomen. Passage of more gas than usual.  Walking can help get rid of the air that was put into your GI tract during the procedure and reduce the bloating. If you had a lower endoscopy (such as a colonoscopy or flexible sigmoidoscopy) you may notice spotting of blood in your stool or on the toilet paper. If you underwent a bowel prep for your procedure, then you may not have a normal bowel movement for a few days.  DIET: Your first meal following the procedure should be a light meal and then it is ok to progress to your normal diet.  A half-sandwich or bowl of soup is an example of a good first meal.  Heavy or fried foods are harder to digest and may make you feel nauseous or bloated.  Likewise meals heavy in dairy and vegetables can cause extra gas to form and this can also increase the bloating.  Drink plenty of fluids but you should avoid alcoholic beverages for 24 hours.  ACTIVITY: Your care partner should take you home directly after the procedure.  You should plan to take it easy, moving slowly for the rest of the day.  You can resume normal activity the day after the procedure however you should NOT DRIVE or use heavy machinery for 24 hours (because of the sedation medicines used during the test).    SYMPTOMS TO REPORT IMMEDIATELY: A gastroenterologist can be reached at any hour.  During normal business hours, 8:30 AM to 5:00 PM Monday through Friday,  call [phone].  After hours and on weekends, please call the GI answering service at [phone] who will take a message and have the physician on call contact you.   Following lower endoscopy (colonoscopy or flexible sigmoidoscopy):  Excessive amounts of blood in the stool  Significant tenderness or worsening of abdominal pains  Swelling of the abdomen that is new, acute  Fever of 100F or higher    FOLLOW UP: If any biopsies were taken you will be contacted by phone or by letter within the next 1-3 weeks.  Call your gastroenterologist if you have not heard about the biopsies in 3 weeks.  Our staff will call the home number listed on your records the next business day following your procedure to check on you and address any questions or concerns that you may have at that time regarding the information given to you following your procedure. This is a courtesy call and so if there is no answer at the home number and we have not heard from you through the emergency physician on call, we will assume that you have returned to your regular daily activities without incident.  SIGNATURES/CONFIDENTIALITY: You and/or your care partner have signed paperwork which will be entered into your electronic medical record.  These signatures attest to the fact that that the information above on your After Visit Summary has been reviewed and is understood.  Full responsibility of the confidentiality   of this discharge information lies with you and/or your care-partner.     

## 2011-03-30 NOTE — Op Note (Signed)
[facility] [address] [place], [place]  [address]  COLONOSCOPY PROCEDURE REPORT  PATIENT:  Samuel, Moyer  MR#:  [id number] BIRTHDATE:  1940-07-16, 70 yrs. old  GENDER:  male ENDOSCOPIST:  Judie Petit T. Russella Dar, MD, [facility]  PROCEDURE DATE:  03/30/2011 PROCEDURE:  Higher-risk screening colonoscopy G0105 ASA CLASS:  Class III INDICATIONS:  1) Elevated Risk Screening  2) family Hx of polyps: father and sister. MEDICATIONS:   MAC sedation, administered by CRNA, propofol (Diprivan) 110 mg IV DESCRIPTION OF PROCEDURE:   After the risks benefits and alternatives of the procedure were thoroughly explained, informed consent was obtained.  Digital rectal exam was performed and revealed no abnormalities.   The [id number] endoscope was introduced through the anus and advanced to the cecum, which was identified by both the appendix and ileocecal valve, without limitations.  The quality of the prep was good, using MoviPrep. The instrument was then slowly withdrawn as the colon was fully examined. <<PROCEDUREIMAGES>> FINDINGS:  Mild diverticulosis was found in the sigmoid colon. Otherwise normal colonoscopy without other polyps, masses, vascular ectasias, or inflammatory changes.   Retroflexed views in the rectum revealed internal hemorrhoids, small.  The time to cecum = 4  minutes. The scope was then withdrawn (time =  12  min) from the patient and the procedure completed.  COMPLICATIONS:  None  ENDOSCOPIC IMPRESSION: 1) Mild diverticulosis in the sigmoid colon 2) Internal hemorrhoids  RECOMMENDATIONS: 1) High fiber diet with liberal fluid intake. 2) Repeat Colonoscopy in 5 years.  Venita Lick. Russella Dar, MD, [facility]  n. eSIGNED:   Venita Lick. Stark at 03/30/2011 11:57 AM  [name], [id number]

## 2011-03-30 NOTE — Progress Notes (Signed)
Patient did not experience any of the following events: a burn prior to discharge; a fall within the facility; wrong site/side/patient/procedure/implant event; or a hospital transfer or hospital admission upon discharge from the facility. (G8907) Patient did not have preoperative order for IV antibiotic SSI prophylaxis. (G8918)  

## 2011-03-31 ENCOUNTER — Telehealth: Payer: Self-pay

## 2011-03-31 NOTE — Telephone Encounter (Signed)
No answer- No answering machine 

## 2011-04-04 NOTE — Progress Notes (Signed)
Addended by: Maple Hudson on: 04/04/2011 12:58 PM   Modules accepted: Level of Service

## 2011-04-07 ENCOUNTER — Encounter (HOSPITAL_COMMUNITY): Payer: Self-pay | Admitting: Emergency Medicine

## 2011-04-07 ENCOUNTER — Emergency Department (HOSPITAL_COMMUNITY)
Admission: EM | Admit: 2011-04-07 | Discharge: 2011-04-07 | Disposition: A | Discharge status: Home Health | Payer: Medicare Other | Attending: Emergency Medicine | Admitting: Emergency Medicine

## 2011-04-07 ENCOUNTER — Emergency Department (HOSPITAL_COMMUNITY): Payer: Medicare Other

## 2011-04-07 ENCOUNTER — Other Ambulatory Visit: Payer: Self-pay

## 2011-04-07 DIAGNOSIS — I4891 Unspecified atrial fibrillation: Secondary | ICD-10-CM | POA: Insufficient documentation

## 2011-04-07 DIAGNOSIS — R002 Palpitations: Secondary | ICD-10-CM | POA: Diagnosis not present

## 2011-04-07 DIAGNOSIS — E785 Hyperlipidemia, unspecified: Secondary | ICD-10-CM | POA: Diagnosis not present

## 2011-04-07 DIAGNOSIS — R079 Chest pain, unspecified: Secondary | ICD-10-CM | POA: Insufficient documentation

## 2011-04-07 DIAGNOSIS — Z79899 Other long term (current) drug therapy: Secondary | ICD-10-CM | POA: Diagnosis not present

## 2011-04-07 DIAGNOSIS — E119 Type 2 diabetes mellitus without complications: Secondary | ICD-10-CM | POA: Diagnosis not present

## 2011-04-07 DIAGNOSIS — D7289 Other specified disorders of white blood cells: Secondary | ICD-10-CM | POA: Diagnosis not present

## 2011-04-07 DIAGNOSIS — F411 Generalized anxiety disorder: Secondary | ICD-10-CM | POA: Diagnosis not present

## 2011-04-07 DIAGNOSIS — I10 Essential (primary) hypertension: Secondary | ICD-10-CM | POA: Diagnosis not present

## 2011-04-07 DIAGNOSIS — Z7982 Long term (current) use of aspirin: Secondary | ICD-10-CM | POA: Diagnosis not present

## 2011-04-07 LAB — BASIC METABOLIC PANEL
Calcium: 9.2 mg/dL (ref 8.4–10.5)
GFR calc Af Amer: >90 mL/min (ref >90)
GFR calc non Af Amer: 84 mL/min — ABNORMAL LOW (ref >90)
Glucose, Bld: 101 mg/dL — ABNORMAL HIGH (ref 70–99)
Potassium: 4.1 mEq/L (ref 3.5–5.1)
Sodium: 136 mEq/L (ref 135–145)

## 2011-04-07 LAB — DIFFERENTIAL
Basophils Absolute: 0 10*3/uL (ref 0.0–0.1)
Eosinophils Absolute: 0.3 10*3/uL (ref 0.0–0.7)
Lymphocytes Relative: 62 % — ABNORMAL HIGH (ref 12–46)
Monocytes Absolute: 0.7 10*3/uL (ref 0.1–1.0)
Neutrophils Relative %: 31 % — ABNORMAL LOW (ref 43–77)

## 2011-04-07 LAB — POCT I-STAT TROPONIN I: Troponin i, poc: 0 ng/mL (ref 0.00–0.08)

## 2011-04-07 LAB — CBC
MCH: 32.4 pg (ref 26.0–34.0)
MCHC: 34.9 g/dL (ref 30.0–36.0)
Platelets: 209 10*3/uL (ref 150–400)
RDW: 15.6 % — ABNORMAL HIGH (ref 11.5–15.5)

## 2011-04-07 LAB — PATHOLOGIST SMEAR REVIEW

## 2011-04-07 NOTE — Discharge Instructions (Signed)
Chest Pain (Nonspecific) It is often hard to give a specific diagnosis for the cause of chest pain. There is always a chance that your pain could be related to something serious, such as a heart attack or a blood clot in the lungs. You need to follow up with your caregiver for further evaluation. CAUSES   Heartburn.   Pneumonia or bronchitis.   Anxiety or stress.   Inflammation around your heart (pericarditis) or lung (pleuritis or pleurisy).   A blood clot in the lung.   A collapsed lung (pneumothorax). It can develop suddenly on its own (spontaneous pneumothorax) or from injury (trauma) to the chest.   Shingles infection (herpes zoster virus).  The chest wall is composed of bones, muscles, and cartilage. Any of these can be the source of the pain.  The bones can be bruised by injury.   The muscles or cartilage can be strained by coughing or overwork.   The cartilage can be affected by inflammation and become sore (costochondritis).  DIAGNOSIS  Lab tests or other studies, such as X-rays, electrocardiography, stress testing, or cardiac imaging, may be needed to find the cause of your pain.  TREATMENT   Treatment depends on what may be causing your chest pain. Treatment may include:   Acid blockers for heartburn.   Anti-inflammatory medicine.   Pain medicine for inflammatory conditions.   Antibiotics if an infection is present.   You may be advised to change lifestyle habits. This includes stopping smoking and avoiding alcohol, caffeine, and chocolate.   You may be advised to keep your head raised (elevated) when sleeping. This reduces the chance of acid going backward from your stomach into your esophagus.   Most of the time, nonspecific chest pain will improve within 2 to 3 days with rest and mild pain medicine.  HOME CARE INSTRUCTIONS   If antibiotics were prescribed, take your antibiotics as directed. Finish them even if you start to feel better.   For the next few  days, avoid physical activities that bring on chest pain. Continue physical activities as directed.   Do not smoke.   Avoid drinking alcohol.   Only take over-the-counter or prescription medicine for pain, discomfort, or fever as directed by your caregiver.   Follow your caregiver's suggestions for further testing if your chest pain does not go away.   Keep any follow-up appointments you made. If you do not go to an appointment, you could develop lasting (chronic) problems with pain. If there is any problem keeping an appointment, you must call to reschedule.  SEEK MEDICAL CARE IF:   You think you are having problems from the medicine you are taking. Read your medicine instructions carefully.   Your chest pain does not go away, even after treatment.   You develop a rash with blisters on your chest.  SEEK IMMEDIATE MEDICAL CARE IF:   You have increased chest pain or pain that spreads to your arm, neck, jaw, back, or abdomen.   You develop shortness of breath, an increasing cough, or you are coughing up blood.   You have severe back or abdominal pain, feel nauseous, or vomit.   You develop severe weakness, fainting, or chills.   You have a fever.  THIS IS AN EMERGENCY. Do not wait to see if the pain will go away. Get medical help at once. Call your local emergency services (911 in [place] Do not drive yourself to the hospital. MAKE SURE YOU:   Understand these instructions.     Will watch your condition.   Will get help right away if you are not doing well or get worse.  Document Released: 10/26/2004 Document Revised: 01/05/2011 Document Reviewed: 08/22/2007 [employer] Patient Information 2012 [employer]. 

## 2011-04-07 NOTE — ED Notes (Signed)
The pt had 2 sharp pains in his chest approx 2 hours ago when he inhaled.  The pain lasted only seconds and after he pulled the pillow from under his head it did not come back.  At present he is pain free.  He took 2 325mg  aspirin at 0245. The pt is currently in af and that is his usual rhy.  Alert oriented skin warm and dry.  Wife at the bedside

## 2011-04-07 NOTE — ED Notes (Signed)
PT. REPORTS LEFT CHEST PAIN AT 2 AM THIS MORNING WORSE WITH DEEP INSPIRATION , DENIES NAUSEA/VOMITTING OR DIAPHORESIS. NO CHEST PAIN AT TRIAGE.

## 2011-04-07 NOTE — ED Provider Notes (Signed)
History     CSN: [id number]  Arrival date & time 04/07/11  0255   First MD Initiated Contact with Patient 04/07/11 0309      Chief Complaint  Patient presents with  . Chest Pain    (Consider location/radiation/quality/duration/timing/severity/associated sxs/prior treatment) Patient is a 71 y.o. male presenting with chest pain. The history is provided by the patient.  Chest Pain The chest pain began 3 - 5 hours ago. Duration of episode(s) is 1 second. Chest pain occurs rarely. The chest pain is resolved. The pain is associated with breathing. At its most intense, the pain is at 10/10. The pain is currently at 0/10. The severity of the pain is severe. The quality of the pain is described as sharp. The pain does not radiate. Exacerbated by: Nothing. Pertinent negatives for primary symptoms include no fever, no syncope, no shortness of breath, no cough, no palpitations, no abdominal pain, no nausea, no vomiting and no altered mental status.  Pertinent negatives for associated symptoms include no claudication, no diaphoresis, no numbness, no orthopnea and no weakness. He tried nothing for the symptoms. Risk factors include male gender.  His past medical history is significant for arrhythmia.    patient with history of anxiety and atrial fibrillation. He woke from sleep around 2 AM. At that time he experienced 2 episodes of sharp severe pain in his left chest lasted just a second and seem to be worse with deep inspiration. He woke his wife up who brought in the emergency department for further evaluation. He remains pain-free without any further symptoms. No history of same. He denies any recent trauma, lifting or exertion. No rash. No recent illness. No associated cough, congestion, or cold.  Past Medical History  Diagnosis Date  . Anxiety   . Claustrophobia     Occasionally when flying   . Hypertension   . Hyperlipidemia   . OSA (obstructive sleep apnea)     CPAP machine   . Atrial  fibrillation   . Colitis   . Hemorrhoids   . Diabetes mellitus, type 2   . Arrhythmia   . PVC's (premature ventricular contractions)   . LV dysfunction     Past Surgical History  Procedure Date  . US echocardiography 11/15/2009    EF 40-45%  . Cardiovascular stress test 03/02/2010    EF 50%    Family History  Problem Relation Age of Onset  . Hypertension Father   . Heart attack Father     Age 45 (MI)  . Heart failure Father   . Colonic polyp Sister     and Father  . Colon cancer Neg Hx   . Stomach cancer Neg Hx   . Stroke Mother     History  Substance Use Topics  . Smoking status: Former Smoker -- 1.0 packs/day for 16 years    Types: Cigarettes    Quit date: 06/30/1977  . Smokeless tobacco: Never Used  . Alcohol Use: No      Review of Systems  Constitutional: Negative for fever, chills and diaphoresis.  HENT: Negative for neck pain and neck stiffness.   Eyes: Negative for pain.  Respiratory: Negative for cough and shortness of breath.   Cardiovascular: Positive for chest pain. Negative for palpitations, orthopnea, claudication and syncope.  Gastrointestinal: Negative for nausea, vomiting and abdominal pain.  Genitourinary: Negative for dysuria.  Musculoskeletal: Negative for back pain.  Skin: Negative for rash.  Neurological: Negative for weakness, numbness and headaches.  Psychiatric/Behavioral: Negative for  altered mental status.  All other systems reviewed and are negative.    Allergies  Review of patient's allergies indicates no known allergies.  Home Medications   Current Outpatient Rx  Name Route Sig Dispense Refill  . AMLODIPINE BESYLATE 10 MG PO TABS Oral Take 1 tablet (10 mg total) by mouth daily. 90 tablet 3  . ASPIRIN EC 325 MG PO TBEC Oral Take 650 mg by mouth once.    . ATORVASTATIN CALCIUM 10 MG PO TABS Oral Take 1 tablet (10 mg total) by mouth daily. 90 tablet 3  . BENAZEPRIL HCL 40 MG PO TABS  TAKE 1 TABLET BY MOUTH ONCE DAILY 90 tablet  1  . DABIGATRAN ETEXILATE MESYLATE 150 MG PO CAPS Oral Take 1 capsule (150 mg total) by mouth every 12 (twelve) hours. 60 capsule 5  . DIGOXIN 0.125 MG PO TABS  TAKE 1 TABLET BY MOUTH DAILY 90 tablet 2  . FUROSEMIDE 20 MG PO TABS Oral Take 1 tablet (20 mg total) by mouth daily. 90 tablet 3  . METOPROLOL TARTRATE 25 MG PO TABS Oral Take 3 tablets (75 mg total) by mouth 2 (two) times daily. 540 tablet 3  . ONE-DAILY MULTI VITAMINS PO TABS Oral Take 1 tablet by mouth daily.      Marland Kitchen PAROXETINE HCL 20 MG PO TABS  TAKE 1/2 TABLET BY MOUTH EVERY DAY 45 tablet 1  . ONETOUCH ULTRA BLUE VI STRP  CHECK BLOODSUGAR DAILY AS DIRECTED 100 each 3    BP 120/88  Pulse 71  Temp(Src) 98 F (36.7 C) (Oral)  Resp 22  SpO2 98%  Physical Exam  Constitutional: He is oriented to person, place, and time. He appears well-developed and well-nourished.  HENT:  Head: Normocephalic and atraumatic.  Eyes: Conjunctivae and EOM are normal. Pupils are equal, round, and reactive to light.  Neck: Trachea normal. Neck supple. No thyromegaly present.  Cardiovascular: Normal rate, regular rhythm, S1 normal, S2 normal and normal pulses.     No systolic murmur is present   No diastolic murmur is present  Pulses:      Radial pulses are 2+ on the right side, and 2+ on the left side.  Pulmonary/Chest: Effort normal and breath sounds normal. He has no wheezes. He has no rhonchi. He has no rales. He exhibits no tenderness.  Abdominal: Soft. Normal appearance and bowel sounds are normal. There is no tenderness. There is no CVA tenderness and negative Murphy's sign.  Musculoskeletal:       BLE:s Calves nontender, no cords or erythema, negative Homans sign  Neurological: He is alert and oriented to person, place, and time. He has normal strength. No cranial nerve deficit or sensory deficit. GCS eye subscore is 4. GCS verbal subscore is 5. GCS motor subscore is 6.  Skin: Skin is warm and dry. No rash noted. He is not diaphoretic.    Psychiatric: His speech is normal.       Cooperative and appropriate    ED Course  Procedures (including critical care time)  Labs Reviewed  CBC - Abnormal; Notable for the following:    WBC 14.8 (*)    RDW 15.6 (*)    All other components within normal limits  DIFFERENTIAL - Abnormal; Notable for the following:    Neutrophils Relative 31 (*)    Lymphocytes Relative 62 (*)    Lymphs Abs 9.2 (*)    All other components within normal limits  BASIC METABOLIC PANEL - Abnormal; Notable for the  following:    Glucose, Bld 101 (*)    GFR calc non Af Amer 84 (*)    All other components within normal limits  DIGOXIN LEVEL - Abnormal; Notable for the following:    Digoxin Level 0.3 (*)    All other components within normal limits  POCT I-STAT TROPONIN I  POCT I-STAT TROPONIN I   Dg Chest 2 View  04/07/2011  *RADIOLOGY REPORT*  Clinical Data: Chest pain, palpitations, hypertension, atrial fibrillation  CHEST - 2 VIEW  Comparison: 01/14/2010  Findings: Enlargement of cardiac silhouette. Calcified tortuous aorta. Pulmonary vascularity normal. Chronic bronchitic and question underlying emphysematous changes. Chronic accentuation of perihilar markings stable. Probable bilateral nipple shadows. No acute infiltrate, pleural effusion, or pneumothorax. Bones appear demineralized.  IMPRESSION: Chronic bronchitic changes and question underlying emphysematous changes. Enlargement of cardiac silhouette. No acute abnormalities.  Original Report Authenticated By: Lollie Marrow, M.D.     Date: 04/07/2011  Rate: 68  Rhythm: atrial fibrillation  QRS Axis: normal  Intervals: normal  ST/T Wave abnormalities: nonspecific ST/T changes  Conduction Disutrbances:none  Narrative Interpretation:   Old EKG Reviewed: unchanged  Aspirin prior to arrival. No pain in the emergency department. Chest x-ray labs reviewed as above.   MDM   Very atypical chest pain, negative cardiac enzymes, no EKG changes. Patient  remains pain-free in the emergency department with 2 sets of negative troponins. No dyspnea and negative d-dimer. No hypoxia. On recheck at 640 remains asymptomatic and is stable for discharge home at this time. Reliable historian verbalizes understanding chest pain precautions and all discharge and followup instructions.        Sunnie Nielsen, MD 04/07/11 [phone]

## 2011-04-13 ENCOUNTER — Ambulatory Visit (INDEPENDENT_AMBULATORY_CARE_PROVIDER_SITE_OTHER): Payer: Medicare Other | Admitting: Cardiology

## 2011-04-13 ENCOUNTER — Encounter: Payer: Self-pay | Admitting: Cardiology

## 2011-04-13 VITALS — BP 128/76 | HR 60 | Ht 68.0 in | Wt 197.0 lb

## 2011-04-13 DIAGNOSIS — I4891 Unspecified atrial fibrillation: Secondary | ICD-10-CM

## 2011-04-13 DIAGNOSIS — I10 Essential (primary) hypertension: Secondary | ICD-10-CM

## 2011-04-13 DIAGNOSIS — I5022 Chronic systolic (congestive) heart failure: Secondary | ICD-10-CM | POA: Diagnosis not present

## 2011-04-13 DIAGNOSIS — I509 Heart failure, unspecified: Secondary | ICD-10-CM | POA: Diagnosis not present

## 2011-04-13 NOTE — Assessment & Plan Note (Signed)
  Ejection fraction of 40-45%. He is well compensated. We will continue with metoprolol and ACE inhibitor. He is also on digoxin. Recent dig level was 0.3. Continue Lasix 20 mg daily.

## 2011-04-13 NOTE — Progress Notes (Signed)
[name] Date of Birth: 1940/12/16   History of Present Illness: [name] is seen today for followup. He states that he has been feeling very well. He did have an episode last week of sharp left chest pain that awoke him from sleep. It was worse with inspiration. He went to the emergency room and evaluation there was unremarkable including cardiac enzymes, ECG, and chest x-ray. He has had no recurrent symptoms. He denies any significant palpitations or dizziness. He is tolerating Pradaxa well without any bleeding.  Current Outpatient Prescriptions on File Prior to Visit  Medication Sig Dispense Refill  . amLODipine (NORVASC) 10 MG tablet Take 1 tablet (10 mg total) by mouth daily.  90 tablet  3  . atorvastatin (LIPITOR) 10 MG tablet Take 1 tablet (10 mg total) by mouth daily.  90 tablet  3  . benazepril (LOTENSIN) 40 MG tablet TAKE 1 TABLET BY MOUTH ONCE DAILY  90 tablet  1  . dabigatran (PRADAXA) 150 MG CAPS Take 1 capsule (150 mg total) by mouth every 12 (twelve) hours.  60 capsule  5  . digoxin (LANOXIN) 0.125 MG tablet TAKE 1 TABLET BY MOUTH DAILY  90 tablet  2  . furosemide (LASIX) 20 MG tablet Take 1 tablet (20 mg total) by mouth daily.  90 tablet  3  . metoprolol tartrate (LOPRESSOR) 25 MG tablet Take 3 tablets (75 mg total) by mouth 2 (two) times daily.  540 tablet  3  . Multiple Vitamin (MULTIVITAMIN) tablet Take 1 tablet by mouth daily.        . ONE TOUCH ULTRA TEST test strip CHECK BLOODSUGAR DAILY AS DIRECTED  100 each  3  . PARoxetine (PAXIL) 20 MG tablet TAKE 1/2 TABLET BY MOUTH EVERY DAY  45 tablet  1    No Known Allergies  Past Medical History  Diagnosis Date  . Anxiety   . Claustrophobia     Occasionally when flying   . Hypertension   . Hyperlipidemia   . OSA (obstructive sleep apnea)     CPAP machine   . Atrial fibrillation   . Colitis   . Hemorrhoids   . Diabetes mellitus, type 2   . Arrhythmia   . PVC's (premature ventricular contractions)   . LV  dysfunction     Past Surgical History  Procedure Date  . US echocardiography 11/15/2009    EF 40-45%  . Cardiovascular stress test 03/02/2010    EF 50%    History  Smoking status  . Former Smoker -- 1.0 packs/day for 16 years  . Types: Cigarettes  . Quit date: 06/30/1977  Smokeless tobacco  . Never Used    History  Alcohol Use No    Family History  Problem Relation Age of Onset  . Hypertension Father   . Heart attack Father     Age 35 (MI)  . Heart failure Father   . Colonic polyp Sister     and Father  . Colon cancer Neg Hx   . Stomach cancer Neg Hx   . Stroke Mother     Review of Systems: As noted in history of present illness. He has had a recent chest cold..  All other systems were reviewed and are negative.  Physical Exam: BP 128/76  Pulse 60  Ht 5\' 8"  (1.727 m)  Wt 197 lb (89.359 kg)  BMI 29.95 kg/m2 The patient is alert and oriented x 3.  The mood and affect are normal.  The skin  is warm and dry.  Color is normal.  The HEENT exam reveals that the sclera are nonicteric.  The mucous membranes are moist.  The carotids are 2+ without bruits.  There is no thyromegaly.  There is no JVD.  The lungs are clear.  The chest wall is non tender.  The heart exam reveals a regular rate with a normal S1 and S2.  There are no murmurs, gallops, or rubs.  The PMI is not displaced.   Abdominal exam reveals good bowel sounds.  There is no guarding or rebound.  There is no hepatosplenomegaly or tenderness.  There are no masses.  Exam of the legs reveal no clubbing, cyanosis, or edema.  The legs are without rashes.  The distal pulses are intact.  Cranial nerves II - XII are intact.  Motor and sensory functions are intact.  The gait is normal. LABORATORY DATA: Labs for you to his emergency room visit. His ECG showed atrial fibrillation with nonspecific ST-T wave changes. Chest x-ray showed evidence of chronic bronchitis. Blood work was negative except for mildly elevated white blood  count.  Assessment / Plan:

## 2011-04-13 NOTE — Patient Instructions (Signed)
Continue your current therapy  I will see you again in 6 months.   

## 2011-04-13 NOTE — Assessment & Plan Note (Signed)
Rate is well controlled and he is chronically anticoagulated with Pradaxa. We will continue his current therapy and followup again in 6 months.

## 2011-04-13 NOTE — Assessment & Plan Note (Signed)
Blood pressure is well controlled on his current regimen 

## 2011-06-05 DIAGNOSIS — L259 Unspecified contact dermatitis, unspecified cause: Secondary | ICD-10-CM | POA: Diagnosis not present

## 2011-06-05 DIAGNOSIS — L821 Other seborrheic keratosis: Secondary | ICD-10-CM | POA: Diagnosis not present

## 2011-06-05 DIAGNOSIS — D485 Neoplasm of uncertain behavior of skin: Secondary | ICD-10-CM | POA: Diagnosis not present

## 2011-06-05 DIAGNOSIS — L578 Other skin changes due to chronic exposure to nonionizing radiation: Secondary | ICD-10-CM | POA: Diagnosis not present

## 2011-06-05 DIAGNOSIS — B079 Viral wart, unspecified: Secondary | ICD-10-CM | POA: Diagnosis not present

## 2011-06-12 ENCOUNTER — Other Ambulatory Visit: Payer: Self-pay | Admitting: Cardiology

## 2011-08-01 ENCOUNTER — Encounter: Payer: Self-pay | Admitting: Internal Medicine

## 2011-08-01 ENCOUNTER — Ambulatory Visit (INDEPENDENT_AMBULATORY_CARE_PROVIDER_SITE_OTHER): Payer: Medicare Other | Admitting: Internal Medicine

## 2011-08-01 VITALS — BP 130/80 | HR 68 | Temp 98.5°F | Wt 205.0 lb

## 2011-08-01 DIAGNOSIS — J209 Acute bronchitis, unspecified: Secondary | ICD-10-CM | POA: Diagnosis not present

## 2011-08-01 DIAGNOSIS — J019 Acute sinusitis, unspecified: Secondary | ICD-10-CM

## 2011-08-01 MED ORDER — CEFUROXIME AXETIL 250 MG PO TABS: 250.0000 mg | ORAL_TABLET | Freq: Two times a day (BID) | ORAL | Status: AC

## 2011-08-01 NOTE — Progress Notes (Signed)
  Subjective:    Patient ID: Samuel Moyer, male    DOB: [birth year], 71 y.o.   MRN: [id number]  HPI Respiratory tract infection Onset/symptoms: 8 days ago, started with cough with production of yellow sputum  Exposures (illness/environmental/extrinsic): granddaughter and son sick Progression of symptoms: cough has decreased in frequency, still productive, sore throat developed over past couple days, eye irritation started last night, frontal headache has improved over past several days Treatments/response: "red spray" helpful, unknown OTC medication not helpful Present symptoms: productive cough, sore throat, frontal headache/pressure Frontal headache: yes with frontal sinus pressure Nasal purulence: yes- yellow Sore throat: yes, mild Associated extrinsic/allergic symptoms:itchy eyes/ sneezing: eyes "irritated" Past medical history: Seasonal allergies/asthma: no Smoking history: quit 1979 Otalgia: right ear                                                                                                                                                                              Review of Systems Fever/chills/sweats: no Facial pain: no Dental pain: no Lymphadenopathy: no Wheezing/shortness of breath: no     Objective:   Physical Exam General appearance:good health ;well nourished; no acute distress or increased work of breathing is present.  No  lymphadenopathy about the head, neck, or axilla noted.   Eyes: No conjunctival inflammation or lid edema is present. There is no scleral icterus.  Ears:  External ear exam shows no significant lesions or deformities.  Otoscopic examination reveals clear canals, tympanic membranes are intact bilaterally without bulging, retraction, inflammation or discharge.  Nose:  External nasal examination shows no deformity or inflammation. Nasal mucosa are pink and moist without lesions or exudates. Septal deviation to the right. No obstruction to  airflow.   Oral exam: Dental hygiene is good; lips and gums are healthy appearing.There is no oropharyngeal erythema or exudate noted.   Neck:  No deformities, thyromegaly, masses, or tenderness noted.   Supple with full range of motion without pain.   Heart:  Slow irregularly irregular rhythm.   Lungs:Chest clear to auscultation; no wheezes, rhonchi,rales ,or rubs present.No increased work of breathing.    Extremities:  No cyanosis, edema, or clubbing  noted    Skin: Warm & dry w/o jaundice or tenting.         Assessment & Plan:  #1 bronchitis, acute without bronchospasm  #2 rhinosinusitis  Plan: Cefuroxime 250 mg twice a day, dispense 14. Cough med will not be Rxed as he says over-the-counter preparations control  his cough

## 2011-08-01 NOTE — Patient Instructions (Addendum)
Plain Mucinex for thick secretions ;force NON dairy fluids . Use a Neti pot daily as needed for sinus congestion; going from open side to congested side . Nasal cleansing in the shower as discussed. Make sure that all residual soap is removed to prevent irritation.  Plain Allegra 160 daily as needed for itchy eyes or sneezing.

## 2011-08-04 DIAGNOSIS — M25569 Pain in unspecified knee: Secondary | ICD-10-CM | POA: Diagnosis not present

## 2011-08-09 ENCOUNTER — Other Ambulatory Visit: Payer: Self-pay | Admitting: Cardiology

## 2011-08-09 ENCOUNTER — Other Ambulatory Visit: Payer: Self-pay | Admitting: Internal Medicine

## 2011-10-12 ENCOUNTER — Other Ambulatory Visit: Payer: Self-pay | Admitting: General Practice

## 2011-10-12 ENCOUNTER — Other Ambulatory Visit: Payer: Self-pay | Admitting: Internal Medicine

## 2011-10-12 MED ORDER — PAROXETINE HCL 20 MG PO TABS: 20.0000 mg | ORAL_TABLET | ORAL | Status: DC

## 2011-10-31 ENCOUNTER — Encounter: Payer: Self-pay | Admitting: Cardiology

## 2011-10-31 ENCOUNTER — Ambulatory Visit (INDEPENDENT_AMBULATORY_CARE_PROVIDER_SITE_OTHER): Payer: Medicare Other | Admitting: Cardiology

## 2011-10-31 VITALS — BP 134/90 | HR 64 | Ht 68.0 in | Wt 210.1 lb

## 2011-10-31 DIAGNOSIS — I5022 Chronic systolic (congestive) heart failure: Secondary | ICD-10-CM | POA: Diagnosis not present

## 2011-10-31 DIAGNOSIS — I4891 Unspecified atrial fibrillation: Secondary | ICD-10-CM

## 2011-10-31 DIAGNOSIS — I10 Essential (primary) hypertension: Secondary | ICD-10-CM | POA: Diagnosis not present

## 2011-10-31 MED ORDER — TADALAFIL 10 MG PO TABS: 10.0000 mg | ORAL_TABLET | Freq: Every day | ORAL | Status: DC | PRN

## 2011-10-31 NOTE — Patient Instructions (Signed)
Continue your current medication  Reduce your sodium intake.  I will see you in 6 months.

## 2011-10-31 NOTE — Progress Notes (Signed)
Samuel Moyer Date of Birth: 72-May-1942   History of Present Illness: Mr. Plano is seen today for followup. He has a history of permanent atrial fibrillation. He also is a history of congestive heart failure with ejection fraction of 40-45%. Since his last visit he has had no chest pain. He denies any shortness of breath. He has had more edema particularly after he has been standing for several hours. He admits more indiscretion with his diet since [place] and eating out a lot.  Current Outpatient Prescriptions on File Prior to Visit  Medication Sig Dispense Refill  . amLODipine (NORVASC) 10 MG tablet TAKE 1 TABLET BY MOUTH ONCE A DAY  90 tablet  1  . atorvastatin (LIPITOR) 10 MG tablet Take 1 tablet (10 mg total) by mouth daily.  90 tablet  3  . benazepril (LOTENSIN) 40 MG tablet TAKE 1 TABLET BY MOUTH ONCE DAILY  90 tablet  1  . digoxin (LANOXIN) 0.125 MG tablet TAKE 1 TABLET BY MOUTH DAILY  90 tablet  2  . furosemide (LASIX) 20 MG tablet TAKE 1 TABLET BY MOUTH DAILY  90 tablet  2  . metoprolol tartrate (LOPRESSOR) 25 MG tablet TAKE 3 TABLETS BY MOUTH TWO TIMES DAILY  540 tablet  2  . Multiple Vitamin (MULTIVITAMIN) tablet Take 1 tablet by mouth daily.        . ONE TOUCH ULTRA TEST test strip CHECK BLOODSUGAR DAILY AS DIRECTED  100 each  3  . PARoxetine (PAXIL) 20 MG tablet Take 1 tablet (20 mg total) by mouth every morning.  45 tablet  1  . PRADAXA 150 MG CAPS TAKE 1 CAPSULE (150MG ) BY MOUTH EVERY 12 HOURS  60 capsule  11  . tadalafil (CIALIS) 10 MG tablet Take 1 tablet (10 mg total) by mouth daily as needed for erectile dysfunction.  10 tablet  0  . DISCONTD: Fluticasone-Salmeterol (ADVAIR) 250-50 MCG/DOSE AEPB Inhale 1 puff into the lungs as needed.        No Known Allergies  Past Medical History  Diagnosis Date  . Anxiety   . Claustrophobia     Occasionally when flying   . Hypertension   . Hyperlipidemia   . OSA (obstructive sleep apnea)    CPAP machine   . Atrial fibrillation   . Colitis   . Hemorrhoids   . Diabetes mellitus, type 2   . Arrhythmia   . PVC's (premature ventricular contractions)   . LV dysfunction     EF 40-45%    Past Surgical History  Procedure Date  . US echocardiography 11/15/2009    EF 40-45%  . Cardiovascular stress test 03/02/2010    EF 50%    History  Smoking status  . Former Smoker -- 1.0 packs/day for 16 years  . Types: Cigarettes  . Quit date: 06/30/1977  Smokeless tobacco  . Never Used    History  Alcohol Use No    Family History  Problem Relation Age of Onset  . Hypertension Father   . Heart attack Father     Age 45 (MI)  . Heart failure Father   . Colonic polyp Sister     and Father  . Colon cancer Neg Hx   . Stomach cancer Neg Hx   . Stroke Mother     Review of Systems: By mouth systems is positive for some erectile dysfunction. He has gained 13 pounds since his last visit.  All other  systems were reviewed and are negative.  Physical Exam: BP 134/90  Pulse 64  Ht 5\' 8"  (1.727 m)  Wt 210 lb 1.9 oz (95.31 kg)  BMI 31.95 kg/m2  SpO2 97% The patient is alert and oriented x 3.   The skin is warm and dry.    The HEENT exam reveals that the sclera are nonicteric.  The mucous membranes are moist.  The carotids are 2+ without bruits.  There is no thyromegaly.  There is no JVD.  The lungs are clear.  The chest wall is non tender.  The heart exam reveals a regular rate with a normal S1 and S2.  There are no murmurs, gallops, or rubs.  The PMI is not displaced.   Abdominal exam reveals good bowel sounds.  There is no guarding or rebound.  There is no hepatosplenomegaly or tenderness.  There are no masses.  Exam of the legs reveals 1+ edema. There is some stasis dermatitis.  The distal pulses are intact.  Cranial nerves II - XII are intact.  Motor and sensory functions are intact.  The gait is normal.  LABORATORY DATA:   Assessment / Plan: 1. Permanent atrial fibrillation.  Rate is well controlled on a combination of Lanoxin and metoprolol. He is on chronic anticoagulation with Pradaxa.  2. Congestive heart failure with chronic systolic dysfunction. Ejection fraction is moderately reduced. He does have some increased edema related to dietary indiscretion. I recommended continuing on his current dose of Lasix. I stressed the importance of sodium restriction with his diet.  3. Hypertension, well controlled.

## 2011-11-20 ENCOUNTER — Telehealth: Payer: Self-pay

## 2011-11-20 MED ORDER — SILDENAFIL CITRATE 50 MG PO TABS: 50.0000 mg | ORAL_TABLET | Freq: Every day | ORAL | Status: DC | PRN

## 2011-11-20 NOTE — Telephone Encounter (Signed)
Patient called was told insurance will not approve cilias will have to take viagra first.Prescription sent to [facility].

## 2011-11-29 ENCOUNTER — Other Ambulatory Visit: Payer: Self-pay | Admitting: Internal Medicine

## 2012-01-04 DIAGNOSIS — H40019 Open angle with borderline findings, low risk, unspecified eye: Secondary | ICD-10-CM | POA: Diagnosis not present

## 2012-01-18 ENCOUNTER — Other Ambulatory Visit: Payer: Self-pay | Admitting: Internal Medicine

## 2012-02-16 ENCOUNTER — Encounter: Payer: Self-pay | Admitting: Internal Medicine

## 2012-02-16 ENCOUNTER — Ambulatory Visit (INDEPENDENT_AMBULATORY_CARE_PROVIDER_SITE_OTHER): Payer: Medicare Other | Admitting: Internal Medicine

## 2012-02-16 VITALS — BP 138/80 | HR 84 | Temp 98.3°F | Wt 214.0 lb

## 2012-02-16 DIAGNOSIS — J209 Acute bronchitis, unspecified: Secondary | ICD-10-CM

## 2012-02-16 DIAGNOSIS — J069 Acute upper respiratory infection, unspecified: Secondary | ICD-10-CM | POA: Diagnosis not present

## 2012-02-16 MED ORDER — FLUTICASONE PROPIONATE 50 MCG/ACT NA SUSP: 1.0000 | Freq: Two times a day (BID) | NASAL | Status: DC | PRN

## 2012-02-16 MED ORDER — AMOXICILLIN 500 MG PO CAPS: 500.0000 mg | ORAL_CAPSULE | Freq: Three times a day (TID) | ORAL | Status: DC

## 2012-02-16 MED ORDER — HYDROCODONE-HOMATROPINE 5-1.5 MG/5ML PO SYRP: 5.0000 mL | ORAL_SOLUTION | Freq: Four times a day (QID) | ORAL | Status: DC | PRN

## 2012-02-16 NOTE — Patient Instructions (Addendum)
Plain Mucinex (NOT D) for thick secretions ;force NON dairy fluids .   Nasal cleansing in the shower as discussed with lather of mild shampoo.After 10 seconds wash off lather while  exhaling through nostrils. Make sure that all residual soap is removed to prevent irritation.  Fluticasone 1 spray in each nostril twice a day as needed. Use the "crossover" technique into opposite nostril spraying toward opposite ear @ 45 degree angle, not straight up into nostril.  Use a Neti pot daily only  as needed for significant sinus congestion; going from open side to congested side . Plain Allegra (NOT D )  160 daily , Loratidine 10 mg , OR Zyrtec 10 mg @ bedtime  as needed for itchy eyes & sneezing. Zicam Melts or Zinc lozenges ; vitamin C 2000 mg daily; & Echinacea for 4-7 days. Fill Rx for antibiotic if fever, exudate("pus") or progressive frontal headache or  facial pain present.

## 2012-02-16 NOTE — Progress Notes (Signed)
  Subjective:    Patient ID: Samuel Moyer, male    DOB: [birth year], 72 y.o.   MRN: [id number]  HPI The respiratory tract symptoms began 7-10 days ago as rhinitis followed by  cough with  yellow as of  sputum 3-4 days ago.  Significant associated symptoms include nasal purulence, trace of epistaxis  & L ear pressure Fever to 99.5 today w/o  chills and sweats .   Cough was not associated with shortness of breath or wheezing        Treatment with  Cough drops & Nyquil was partially effective  There is no history of asthma. The patient had quit smoking in 1979                Review of Systems Symptoms not present include frontal headache, facial pain, dental pain, sore throat and otic discharge Itchy , watery eyes & sneezing were not noted.       Objective:   Physical Exam General appearance:good health ;well nourished; no acute distress or increased work of breathing is present.  No  lymphadenopathy about the head, neck, or axilla noted.  Eyes: No conjunctival inflammation or lid edema is present. Ears:  External ear exam shows no significant lesions or deformities.  Otoscopic examination reveals clear canals, tympanic membranes are intact bilaterally without bulging, retraction, inflammation or discharge. Nose:  External nasal examination shows no deformity or inflammation. Nasal mucosa are pink and moist without lesions or exudates. Septal  Deviation to R.No obstruction to airflow.  Oral exam: Dental hygiene is good; lips and gums are healthy appearing.There is no oropharyngeal erythema or exudate noted. Hoarse Neck:  No deformities,  masses, or tenderness noted.   Heart:  Normal rate and regular rhythm. S1 and S2 normal without gallop, murmur, click, rub.S 4  Lungs:Chest clear to auscultation; no wheezes, rhonchi,rales ,or rubs present.No increased work of breathing.   Extremities:  No cyanosis or clubbing  noted . Trace edema Skin: Warm & dry          Assessment  & Plan:  #1 acute bronchitis w/o bronchospasm #2 URI Plan: See orders and recommendations

## 2012-03-02 ENCOUNTER — Other Ambulatory Visit: Payer: Self-pay | Admitting: Cardiology

## 2012-03-08 ENCOUNTER — Other Ambulatory Visit: Payer: Self-pay | Admitting: Cardiology

## 2012-03-24 ENCOUNTER — Other Ambulatory Visit: Payer: Self-pay | Admitting: Cardiology

## 2012-04-13 ENCOUNTER — Other Ambulatory Visit: Payer: Self-pay | Admitting: Cardiology

## 2012-04-22 ENCOUNTER — Encounter: Payer: Self-pay | Admitting: Pulmonary Disease

## 2012-04-22 ENCOUNTER — Ambulatory Visit (INDEPENDENT_AMBULATORY_CARE_PROVIDER_SITE_OTHER): Payer: Medicare Other | Admitting: Pulmonary Disease

## 2012-04-22 VITALS — BP 130/80 | HR 83 | Temp 97.5°F | Ht 68.0 in | Wt 218.2 lb

## 2012-04-22 DIAGNOSIS — G4733 Obstructive sleep apnea (adult) (pediatric): Secondary | ICD-10-CM | POA: Diagnosis not present

## 2012-04-22 NOTE — Assessment & Plan Note (Signed)
Your  cpap is set at 10 cm You have some residual events but this is acceptable  Weight loss encouraged, compliance with goal of at least 4-6 hrs every night is the expectation. Advised against medications with sedative side effects Cautioned against driving when sleepy - understanding that sleepiness will vary on a day to day basis

## 2012-04-22 NOTE — Patient Instructions (Addendum)
Your  cpap is set at 10 cm You have some residual events but this is acceptable

## 2012-04-22 NOTE — Progress Notes (Signed)
  Subjective:    Patient ID: Samuel Moyer, male    DOB: 01/01/1941, 72 y.o.   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  PSG 04/06/10 (wt 200) showed severe obstructive sleep apnea with AHI 66/h, nadir desatn 75% corrrected by CPAP 10 cm to AHI 4.5/h. Higher pressures were associated with emergence of central events.  Not needed advair or xopenex MDI since hosp dc  Download 4/30 -07/18/10 >>excellent usage 7h, residual AI 11/h  2/13 Download shows good compliance, residual ahi 11/h   04/22/2012 1y FU Pt states he wears his CPAP machine everynight x 7-8 hrs a night. no problems w/ mask/machine. pt needs new seal for mask.   Travelled to [place] again this yr States using cpap 7-8 hours every night. Mask fits ok, states got a new seal x 1 month ago.  On benazepril  He denies daytime somnolence, mask ok, pressure ok, no snoring  Download 3/14 >> residual AHI 15/h on cpap 10 cm, good usage   Review of Systems neg for any significant sore throat, dysphagia, itching, sneezing, nasal congestion or excess/ purulent secretions, fever, chills, sweats, unintended wt loss, pleuritic or exertional cp, hempoptysis, orthopnea pnd or change in chronic leg swelling. Also denies presyncope, palpitations, heartburn, abdominal pain, nausea, vomiting, diarrhea or change in bowel or urinary habits, dysuria,hematuria, rash, arthralgias, visual complaints, headache, numbness weakness or ataxia.     Objective:   Physical Exam  Gen. Pleasant, well-nourished, in no distress ENT - no lesions, no post nasal drip Neck: No JVD, no thyromegaly, no carotid bruits Lungs: no use of accessory muscles, no dullness to percussion, clear without rales or rhonchi  Cardiovascular: Rhythm regular, heart sounds  normal, no murmurs or gallops, no  peripheral edema Musculoskeletal: No deformities, no cyanosis or clubbing         Assessment & Plan:

## 2012-04-24 ENCOUNTER — Encounter: Payer: Self-pay | Admitting: Cardiology

## 2012-04-24 ENCOUNTER — Ambulatory Visit (INDEPENDENT_AMBULATORY_CARE_PROVIDER_SITE_OTHER): Payer: Medicare Other | Admitting: Cardiology

## 2012-04-24 VITALS — BP 140/88 | HR 50 | Ht 68.0 in | Wt 211.6 lb

## 2012-04-24 DIAGNOSIS — I10 Essential (primary) hypertension: Secondary | ICD-10-CM

## 2012-04-24 DIAGNOSIS — I4891 Unspecified atrial fibrillation: Secondary | ICD-10-CM | POA: Diagnosis not present

## 2012-04-24 DIAGNOSIS — I5022 Chronic systolic (congestive) heart failure: Secondary | ICD-10-CM | POA: Diagnosis not present

## 2012-04-24 MED ORDER — METOPROLOL TARTRATE 25 MG PO TABS: 50.0000 mg | ORAL_TABLET | Freq: Two times a day (BID) | ORAL | Status: DC

## 2012-04-24 NOTE — Patient Instructions (Signed)
Reduce metoprolol to 50 mg twice a day.  Continue your other medications.

## 2012-04-24 NOTE — Progress Notes (Signed)
Samuel Moyer Date of Birth: 12/05/1940   History of Present Illness: Samuel Moyer is seen today for followup. He has a history of permanent atrial fibrillation. He also is a history of congestive heart failure with ejection fraction of 40-45%. On followup today he reports he is doing very well. He just returned from a visit to [place]. He states he does have some dyspnea and leg fatigue with heavy exertion. He does have a history of sleep apnea. Sometimes when he awakens from sleep he notes some numbness in his hand and fingers  Current Outpatient Prescriptions on File Prior to Visit  Medication Sig Dispense Refill  . ALPRAZolam (XANAX) 0.25 MG tablet Take 0.25 mg by mouth as needed.      Marland Kitchen amLODipine (NORVASC) 10 MG tablet TAKE 1 TABLET ONCE A DAY  90 tablet  1  . atorvastatin (LIPITOR) 10 MG tablet TAKE 1 TABLET BY MOUTH DAILY  90 tablet  0  . benazepril (LOTENSIN) 40 MG tablet TAKE 1 TABLET ONCE DAILY  90 tablet  1  . furosemide (LASIX) 20 MG tablet TAKE 1 TABLET DAILY  90 tablet  1  . LANOXIN 0.125 MG tablet TAKE 1 TABLET BY MOUTH DAILY  90 tablet  0  . Multiple Vitamin (MULTIVITAMIN) tablet Take 1 tablet by mouth daily.        . ONE TOUCH ULTRA TEST test strip CHECK BLOODSUGAR DAILY AS DIRECTED  100 each  3  . PARoxetine (PAXIL) 20 MG tablet Take 20 mg by mouth. 1/2 by mouth daily      . PRADAXA 150 MG CAPS TAKE 1 CAPSULE BY MOUTH EVERY 12 HOURS  3 capsule  1  . sildenafil (VIAGRA) 50 MG tablet Take 1 tablet (50 mg total) by mouth daily as needed for erectile dysfunction.  10 tablet  0  . [DISCONTINUED] Fluticasone-Salmeterol (ADVAIR) 250-50 MCG/DOSE AEPB Inhale 1 puff into the lungs as needed.       No current facility-administered medications on file prior to visit.    No Known Allergies  Past Medical History  Diagnosis Date  . Anxiety   . Claustrophobia     Occasionally when flying   . Hypertension   . Hyperlipidemia   . OSA (obstructive sleep apnea)     CPAP machine    . Atrial fibrillation   . Colitis   . Hemorrhoids   . Diabetes mellitus, type 2   . PVC's (premature ventricular contractions)   . LV dysfunction     EF 40-45%    Past Surgical History  Procedure Laterality Date  . US echocardiography  11/15/2009    EF 40-45%  . Cardiovascular stress test  03/02/2010    EF 50%    History  Smoking status  . Former Smoker -- 1.00 packs/day for 16 years  . Types: Cigarettes  . Quit date: 06/30/1977  Smokeless tobacco  . Never Used    History  Alcohol Use No    Family History  Problem Relation Age of Onset  . Hypertension Father   . Heart attack Father     Age 81 (MI)  . Heart failure Father   . Colonic polyp Sister     and Father  . Colon cancer Neg Hx   . Stomach cancer Neg Hx   . Stroke Mother     Review of Systems: By mouth systems is positive for some erectile dysfunction. He has gained 13 pounds since his last visit.  All other systems  were reviewed and are negative.  Physical Exam: BP 140/88  Pulse 50  Ht 5\' 8"  (1.727 m)  Wt 211 lb 9.6 oz (95.981 kg)  BMI 32.18 kg/m2  SpO2 99% The patient is alert and oriented x 3.   The skin is warm and dry.    The HEENT exam  is normal.  The carotids are 2+ without bruits.  There is no thyromegaly.  There is no JVD.  The lungs are clear.   The heart exam reveals  an irregular  rate with a normal S1 and S2.  There are no murmurs, gallops, or rubs.  The PMI is not displaced.   Abdominal exam reveals good bowel sounds. Exam of the legs reveals  no  edema.   The distal pulses are intact.  Cranial nerves II - XII are intact.  Motor and sensory functions are intact.  The gait is normal.  LABORATORY DATA:  ECG today demonstrates atrial fibrillation with a ventricular response of 55 beats per minute. There are nonspecific ST-T wave changes.   Assessment / Plan: 1. Permanent atrial fibrillation. Rate is  slow  on a combination of Lanoxin and metoprolol.  this may be contributing to his symptoms  of fatigue and dyspnea. I recommended reducing his metoprolol to 50 mg twice a day. He is going to monitor his heart rate and if it remains low we may discontinue his Lanoxin as well. He is on chronic anticoagulation with Pradaxa.  2. Congestive heart failure with chronic systolic dysfunction. Ejection fraction is moderately reduced. continue ACE inhibitor, beta blocker, and diuretic therapy. Continue sodium restriction.  3. Hypertension, well controlled.

## 2012-05-09 DIAGNOSIS — H905 Unspecified sensorineural hearing loss: Secondary | ICD-10-CM | POA: Diagnosis not present

## 2012-05-20 ENCOUNTER — Other Ambulatory Visit: Payer: Self-pay | Admitting: Internal Medicine

## 2012-05-21 NOTE — Telephone Encounter (Signed)
Lipid/Hep 272.4/995.20  

## 2012-06-16 ENCOUNTER — Other Ambulatory Visit: Payer: Self-pay | Admitting: Internal Medicine

## 2012-06-18 NOTE — Telephone Encounter (Signed)
Verified with patient he is taking 1/2 by mouth daily

## 2012-06-25 DIAGNOSIS — H40019 Open angle with borderline findings, low risk, unspecified eye: Secondary | ICD-10-CM | POA: Diagnosis not present

## 2012-07-08 ENCOUNTER — Telehealth: Payer: Self-pay | Admitting: Cardiology

## 2012-07-08 ENCOUNTER — Other Ambulatory Visit: Payer: Self-pay | Admitting: Internal Medicine

## 2012-07-08 NOTE — Telephone Encounter (Signed)
Debbie from dental office called to advised that the patient is scheduled for a tooth extraction on 7/7. They would like to hold Pradaxa for 3 days. Remo Lipps that we will ask Dr. [place] on 6/11 and call her back.

## 2012-07-08 NOTE — Telephone Encounter (Signed)
Samuel Moyer is at high risk for CVA. If he is having a single tooth extraction he should not stop Pradaxa. If multiple extractions are done he may stop for 48 hours only.  Samuel Moyer [place] MD, [facility]

## 2012-07-08 NOTE — Telephone Encounter (Signed)
New problem      Need to  come off blood thinner due to tooth extraction .

## 2012-07-09 NOTE — Telephone Encounter (Signed)
Spoke with Dr. Helmut Muster, Oral surgeon and gave him Dr. Elvis Coil orders. Dr. Helmut Muster informed me that this is a single tooth extraction, that he does need to cut the gum a little but that he has no problem not stopping the patient's Pradaxa.  Dr. Helmut Muster states that he will inform the patient.

## 2012-07-09 NOTE — Telephone Encounter (Signed)
Called [facility] to give Dr. Elvis Coil message to [place].  I was told that I need to speak to Dr. Helmut Muster and someone will call back soon.

## 2012-07-21 ENCOUNTER — Other Ambulatory Visit: Payer: Self-pay | Admitting: Cardiology

## 2012-07-22 NOTE — Telephone Encounter (Signed)
Peter M Sw[place], MD at 04/24/2012  1:22 PM  LANOXIN 0.125 MG tablet  TAKE 1 TABLET BY MOUTH DAILY   90 tablet   0   Patient Instructions: Reduce metoprolol to 50 mg twice a day. Continue your other medications.

## 2012-08-10 ENCOUNTER — Other Ambulatory Visit: Payer: Self-pay | Admitting: Internal Medicine

## 2012-08-12 NOTE — Telephone Encounter (Signed)
Lipid/Hep 272.4/995.20  

## 2012-08-26 ENCOUNTER — Other Ambulatory Visit: Payer: Self-pay | Admitting: Internal Medicine

## 2012-09-27 ENCOUNTER — Other Ambulatory Visit: Payer: Self-pay | Admitting: Cardiology

## 2012-10-03 ENCOUNTER — Other Ambulatory Visit: Payer: Self-pay | Admitting: *Deleted

## 2012-10-03 MED ORDER — METOPROLOL TARTRATE 25 MG PO TABS: 50.0000 mg | ORAL_TABLET | Freq: Two times a day (BID) | ORAL | Status: DC

## 2012-10-21 ENCOUNTER — Other Ambulatory Visit: Payer: Self-pay | Admitting: Cardiology

## 2012-10-23 ENCOUNTER — Other Ambulatory Visit: Payer: Self-pay | Admitting: Internal Medicine

## 2012-10-23 NOTE — Telephone Encounter (Signed)
Filled only #30 pt overdue for appt and labs.

## 2012-10-24 ENCOUNTER — Encounter: Payer: Self-pay | Admitting: Cardiology

## 2012-10-24 ENCOUNTER — Ambulatory Visit (INDEPENDENT_AMBULATORY_CARE_PROVIDER_SITE_OTHER): Payer: Medicare Other | Admitting: Cardiology

## 2012-10-24 VITALS — BP 140/91 | HR 64 | Ht 68.0 in | Wt 214.0 lb

## 2012-10-24 DIAGNOSIS — I4891 Unspecified atrial fibrillation: Secondary | ICD-10-CM

## 2012-10-24 DIAGNOSIS — I10 Essential (primary) hypertension: Secondary | ICD-10-CM | POA: Diagnosis not present

## 2012-10-24 DIAGNOSIS — E785 Hyperlipidemia, unspecified: Secondary | ICD-10-CM

## 2012-10-24 DIAGNOSIS — E119 Type 2 diabetes mellitus without complications: Secondary | ICD-10-CM | POA: Diagnosis not present

## 2012-10-24 DIAGNOSIS — I5022 Chronic systolic (congestive) heart failure: Secondary | ICD-10-CM

## 2012-10-24 NOTE — Progress Notes (Signed)
Samuel Moyer Date of Birth: 1940/06/11   History of Present Illness: Samuel Moyer is seen today for followup. He has a history of permanent atrial fibrillation. He also is a history of congestive heart failure with ejection fraction of 40-45%. On followup today he reports he is doing very well. He has experienced some swelling in his legs but this has improved. It was [place] and [place] for 5 hours at a time. He denies any increase shortness of breath. He has no palpitations or dizziness. He denies any chest pain.  Current Outpatient Prescriptions on File Prior to Visit  Medication Sig Dispense Refill  . ALPRAZolam (XANAX) 0.25 MG tablet Take 0.25 mg by mouth as needed.      Marland Kitchen amLODipine (NORVASC) 10 MG tablet TAKE 1 TABLET DAILY  90 tablet  1  . atorvastatin (LIPITOR) 10 MG tablet TAKE 1 TABLET DAILY (LABS OVERDUE)  30 tablet  0  . benazepril (LOTENSIN) 40 MG tablet TAKE 1 TABLET DAILY  90 tablet  1  . furosemide (LASIX) 20 MG tablet TAKE 1 TABLET DAILY  90 tablet  0  . glucose blood (ONE TOUCH ULTRA TEST) test strip DX:250.00, LABS OVERDUE, Check blood sugar daily as directed  100 each  0  . LANOXIN 125 MCG tablet TAKE 1 TABLET DAILY  90 tablet  2  . metoprolol tartrate (LOPRESSOR) 25 MG tablet Take 2 tablets (50 mg total) by mouth 2 (two) times daily.  360 tablet  1  . Multiple Vitamin (MULTIVITAMIN) tablet Take 1 tablet by mouth daily.        Letta Pate DELICA LANCETS 33G MISC 1 each by Other route daily. DX: 250.00, LABS OVERDUE, Check blood sugar daily as directed  100 each  0  . PARoxetine (PAXIL) 20 MG tablet Take 20 mg by mouth. 1/2 by mouth daily      . PRADAXA 150 MG CAPS capsule TAKE 1 CAPSULE EVERY 12 HOURS  3 capsule  0  . sildenafil (VIAGRA) 50 MG tablet Take 1 tablet (50 mg total) by mouth daily as needed for erectile dysfunction.  10 tablet  0  . [DISCONTINUED] Fluticasone-Salmeterol (ADVAIR) 250-50 MCG/DOSE AEPB Inhale 1 puff  into the lungs as needed.       No current facility-administered medications on file prior to visit.    No Known Allergies  Past Medical History  Diagnosis Date  . Anxiety   . Claustrophobia     Occasionally when flying   . Hypertension   . Hyperlipidemia   . OSA (obstructive sleep apnea)     CPAP machine   . Atrial fibrillation   . Colitis   . Hemorrhoids   . Diabetes mellitus, type 2   . PVC's (premature ventricular contractions)   . LV dysfunction     EF 40-45%    Past Surgical History  Procedure Laterality Date  . US echocardiography  11/15/2009    EF 40-45%  . Cardiovascular stress test  03/02/2010    EF 50%    History  Smoking status  . Former Smoker -- 1.00 packs/day for 16 years  . Types: Cigarettes  . Quit date: 06/30/1977  Smokeless tobacco  . Never Used    History  Alcohol Use No    Family History  Problem Relation Age of Onset  . Hypertension Father   . Heart attack Father     Age 39 (MI)  . Heart failure Father   .  Colonic polyp Sister     and Father  . Colon cancer Neg Hx   . Stomach cancer Neg Hx   . Stroke Mother     Review of Systems: As noted in history of present illness  All other systems were reviewed and are negative.  Physical Exam: BP 140/91  Pulse 64  Ht 5\' 8"  (1.727 m)  Wt 97.07 kg (214 lb)  BMI 32.55 kg/m2 The patient is alert and oriented x 3.   The skin is warm and dry.    The HEENT exam  is normal.  The carotids are 2+ without bruits.  There is no thyromegaly.  There is no JVD.  The lungs are clear.   The heart exam reveals  an irregular  rate with a normal S1 and S2.  There are no murmurs, gallops, or rubs.  The PMI is not displaced.   Abdominal exam reveals good bowel sounds. Exam of the legs reveals  1+ ankle  edema.   The distal pulses are intact.  Cranial nerves II - XII are intact.  Motor and sensory functions are intact.  The gait is normal.  LABORATORY DATA:   Assessment / Plan: 1. Permanent atrial  fibrillation. Rate is  improved since we reduced his metoprolol dose.  Continue current doses of metoprolol and Lanoxin. He is on chronic anticoagulation with Pradaxa.  2. Congestive heart failure with chronic systolic dysfunction. Ejection fraction is moderately reduced. continue ACE inhibitor, beta blocker, and diuretic therapy. Continue sodium restriction. He appears to be well compensated today.  3. Hypertension, well controlled.

## 2012-10-24 NOTE — Patient Instructions (Signed)
Continue your current therapy  I will see you in 6 months.   

## 2012-11-05 DIAGNOSIS — Z23 Encounter for immunization: Secondary | ICD-10-CM | POA: Diagnosis not present

## 2012-11-08 ENCOUNTER — Other Ambulatory Visit: Payer: Self-pay | Admitting: Internal Medicine

## 2012-11-08 ENCOUNTER — Telehealth: Payer: Self-pay | Admitting: *Deleted

## 2012-11-08 DIAGNOSIS — F419 Anxiety disorder, unspecified: Secondary | ICD-10-CM

## 2012-11-08 MED ORDER — PAROXETINE HCL 20 MG PO TABS: ORAL_TABLET | ORAL | Status: DC

## 2012-11-08 NOTE — Telephone Encounter (Signed)
Generic Paxil 20 mg 1/2 pill daily #30

## 2012-11-08 NOTE — Telephone Encounter (Signed)
Please verify dose of Paxil. Patient is requesting a refill.

## 2012-11-08 NOTE — Telephone Encounter (Signed)
rx refilled per Dr Alwyn Ren order. DJR

## 2012-12-08 ENCOUNTER — Other Ambulatory Visit: Payer: Self-pay | Admitting: Internal Medicine

## 2012-12-19 ENCOUNTER — Other Ambulatory Visit: Payer: Self-pay | Admitting: Internal Medicine

## 2012-12-20 NOTE — Telephone Encounter (Signed)
Paroxetine refilled per protocol

## 2012-12-23 ENCOUNTER — Encounter: Payer: Self-pay | Admitting: Internal Medicine

## 2012-12-23 ENCOUNTER — Ambulatory Visit (INDEPENDENT_AMBULATORY_CARE_PROVIDER_SITE_OTHER): Payer: Medicare Other | Admitting: Internal Medicine

## 2012-12-23 VITALS — BP 166/95 | HR 68 | Temp 97.9°F | Resp 16 | Wt 224.0 lb

## 2012-12-23 DIAGNOSIS — J310 Chronic rhinitis: Secondary | ICD-10-CM | POA: Diagnosis not present

## 2012-12-23 DIAGNOSIS — R7309 Other abnormal glucose: Secondary | ICD-10-CM | POA: Diagnosis not present

## 2012-12-23 DIAGNOSIS — E785 Hyperlipidemia, unspecified: Secondary | ICD-10-CM | POA: Diagnosis not present

## 2012-12-23 DIAGNOSIS — I10 Essential (primary) hypertension: Secondary | ICD-10-CM | POA: Diagnosis not present

## 2012-12-23 DIAGNOSIS — F411 Generalized anxiety disorder: Secondary | ICD-10-CM

## 2012-12-23 LAB — LIPID PANEL
Cholesterol: 161 mg/dL (ref 0–200)
HDL: 46.8 mg/dL (ref >39.00)
LDL Cholesterol: 89 mg/dL (ref 0–99)
Total CHOL/HDL Ratio: 3
Triglycerides: 127 mg/dL (ref 0.0–149.0)

## 2012-12-23 LAB — BASIC METABOLIC PANEL
BUN: 13 mg/dL (ref 6–23)
CO2: 26 mEq/L (ref 19–32)
Chloride: 105 mEq/L (ref 96–112)
Creatinine, Ser: 1 mg/dL (ref 0.4–1.5)
Glucose, Bld: 115 mg/dL — ABNORMAL HIGH (ref 70–99)

## 2012-12-23 LAB — HEPATIC FUNCTION PANEL
Bilirubin, Direct: 0 mg/dL (ref 0.0–0.3)
Total Bilirubin: 0.7 mg/dL (ref 0.3–1.2)

## 2012-12-23 LAB — MICROALBUMIN / CREATININE URINE RATIO: Creatinine,U: 101.6 mg/dL

## 2012-12-23 LAB — HEMOGLOBIN A1C: Hgb A1c MFr Bld: 6.5 % (ref 4.6–6.5)

## 2012-12-23 MED ORDER — ATORVASTATIN CALCIUM 10 MG PO TABS: ORAL_TABLET | ORAL | Status: DC

## 2012-12-23 MED ORDER — ALPRAZOLAM 0.25 MG PO TABS: 0.2500 mg | ORAL_TABLET | Freq: Two times a day (BID) | ORAL | Status: DC | PRN

## 2012-12-23 NOTE — Progress Notes (Signed)
  Subjective:    Patient ID: Samuel Moyer, male    DOB: [birth year], 72 y.o.   MRN: [id number]  HPI  HYPERTENSION: Disease Monitoring: Blood pressure range-120-125/85-90; "error readings due to AF" Chest pain, palpitations- no       Dyspnea- no Medications: Compliance- yes  Lightheadedness,Syncope- no   Edema- occasionally both legs after pronged standing  FASTING HYPERGLYCEMIA, PMH of:  FBS checked weekly - 120-135 Polyuria/phagia/dipsia- no      Visual problems- no  HYPERLIPIDEMIA: Disease Monitoring: See symptoms for Hypertension Medications: Compliance- yes  Abd pain, bowel changes- no   Muscle aches- no    Review of Systems   He has some minor rhinitis symptoms without symptoms of rhinosinusitis such as frontal headache, facial pain, nasal purulence, sore throat, or fever. He relates this to being around his grandchildren.  He remains on the Paxil one half pill daily. He rarely takes Xanax; he will take if he is going to the dentist.       Objective:   Physical Exam Gen.: Healthy and well-nourished in appearance. Alert, appropriate and cooperative throughout exam.Appears younger than stated age  Head: Normocephalic without obvious abnormalities; no alopecia . Moustache Eyes: No corneal or conjunctival inflammation noted. Pupils equal round reactive to light and accommodation. Arcus Ears: External  ear exam reveals no significant lesions or deformities. Hearing aids bilaterally. Nose: External nasal exam reveals no deformity or inflammation. Nasal mucosa are pink and moist. No lesions or exudates noted.   Mouth: Oral mucosa and oropharynx reveal no lesions or exudates. Teeth in good repair. Neck: No deformities, masses, or tenderness noted. Range of motion decfreased. Thyroid substernal. Lungs: Normal respiratory effort; chest expands symmetrically. Lungs are clear to auscultation without rales, wheezes, or increased work of breathing. Decreased BS Heart: Slow irregular  rate and rhythm. Normal S1 and S2. No gallop, click, or rub. No murmur. Repeat BP 145/90 Abdomen: Bowel sounds normal; abdomen soft and nontender. No masses, organomegaly or hernias noted.                               Musculoskeletal/extremities: No clubbing, cyanosis, edema, or significant extremity  deformity noted. Range of motion normal .Tone & strength normal. Hand joints normal . Fingernail / toenail health good. Able to lie down & sit up w/o help.  Vascular: Carotid, radial artery, dorsalis pedis and  posterior tibial pulses are  Equal.Decreased PTP. No bruits present. Neurologic: Alert and oriented x3. Deep tendon reflexes symmetrical and normal.  Gait normal         Skin: Intact without suspicious lesions or rashes. Lymph: No cervical, axillary lymphadenopathy present. Psych: Mood and affect are normal. Normally interactive                                                                                        Assessment & Plan:  See Current Assessment & Plan in Problem List under specific Diagnosis

## 2012-12-23 NOTE — Patient Instructions (Addendum)
Please review the medication list in the After Visit Summary provided.Please verify the medication name (this may be  brand or generic) & correct dosage. Write the name of the prescribing physician to the right of the medication and share this with all medical staff seen at each appointment. This will help provide continuity of care; help optimize therapeutic interventions;and help prevent drug:drug adverse reaction. Plain Mucinex (NOT D) for thick secretions ;force NON dairy fluids .   Nasal cleansing in the shower as discussed with lather of mild shampoo.After 10 seconds wash off lather while  exhaling through nostrils. Make sure that all residual soap is removed to prevent irritation.  Nasacort AQ OTC 1 spray in each nostril twice a day as needed. Use the "crossover" technique into opposite nostril spraying toward opposite ear @ 45 degree angle, not straight up into nostril.  Use a Neti pot daily only  as needed for significant sinus congestion; going from open side to congested side . Plain Allegra (NOT D )  160 daily , Loratidine 10 mg , OR Zyrtec 10 mg @ bedtime  as needed for itchy eyes & sneezing.   Minimal Blood Pressure Goal= AVERAGE < 140/90;  Ideal is an AVERAGE < 135/85. This AVERAGE should be calculated from @ least 5-7 BP readings taken @ different times of day on different days of week. You should not respond to isolated BP readings , but rather the AVERAGE for that week .Please bring your  blood pressure cuff to office visits to verify that it is reliable.It  can also be checked against the blood pressure device at the pharmacy. Finger or wrist cuffs are not dependable; an arm cuff is. Your next office appointment will be determined based upon review of your pending labs . Those instructions will be transmitted to you through My Chart  .

## 2012-12-25 ENCOUNTER — Encounter: Payer: Self-pay | Admitting: *Deleted

## 2012-12-30 LAB — HM DIABETES EYE EXAM

## 2013-01-01 ENCOUNTER — Other Ambulatory Visit: Payer: Self-pay | Admitting: Cardiology

## 2013-01-07 ENCOUNTER — Telehealth: Payer: Self-pay | Admitting: Cardiology

## 2013-01-07 NOTE — Telephone Encounter (Signed)
Returned call to patient new mychart code # [id number].

## 2013-01-07 NOTE — Telephone Encounter (Signed)
New Message  Pt in need of a new activation code for My chart

## 2013-01-17 DIAGNOSIS — H40019 Open angle with borderline findings, low risk, unspecified eye: Secondary | ICD-10-CM | POA: Diagnosis not present

## 2013-01-19 ENCOUNTER — Other Ambulatory Visit: Payer: Self-pay | Admitting: Internal Medicine

## 2013-01-20 ENCOUNTER — Encounter: Payer: Self-pay | Admitting: Lab

## 2013-01-20 DIAGNOSIS — J159 Unspecified bacterial pneumonia: Secondary | ICD-10-CM | POA: Diagnosis not present

## 2013-01-21 ENCOUNTER — Ambulatory Visit (INDEPENDENT_AMBULATORY_CARE_PROVIDER_SITE_OTHER): Payer: Medicare Other | Admitting: Internal Medicine

## 2013-01-21 ENCOUNTER — Encounter: Payer: Self-pay | Admitting: Internal Medicine

## 2013-01-21 VITALS — BP 150/75 | HR 85 | Temp 99.0°F | Ht 67.25 in | Wt 222.2 lb

## 2013-01-21 DIAGNOSIS — J209 Acute bronchitis, unspecified: Secondary | ICD-10-CM | POA: Diagnosis not present

## 2013-01-21 DIAGNOSIS — J45901 Unspecified asthma with (acute) exacerbation: Secondary | ICD-10-CM

## 2013-01-21 MED ORDER — PREDNISONE 20 MG PO TABS: 20.0000 mg | ORAL_TABLET | Freq: Two times a day (BID) | ORAL | Status: DC

## 2013-01-21 MED ORDER — FLUTICASONE-SALMETEROL 250-50 MCG/DOSE IN AEPB: 1.0000 | INHALATION_SPRAY | Freq: Two times a day (BID) | RESPIRATORY_TRACT | Status: DC

## 2013-01-21 NOTE — Telephone Encounter (Signed)
Amlodipine and Benazepril refilled per protocol. JG//CMA

## 2013-01-21 NOTE — Progress Notes (Signed)
   Subjective:    Patient ID: Samuel Moyer, male    DOB: 03-Nov-1940, 72 y.o.   MRN: [id number]  HPI  He has had intermittent coughing since November 20 of this year. Over the last 4-5 days the cough has been associated with wheezing. He went to an walk in clinic last night where an x-ray was performed. He was prescribed moxifloxacin 400 mg daily. He apparently has an inhaler but it may be out of date.  He has been having some fever as well as discolored nasal secretions and discolored sputum. He also describes postnasal drainage.  Milder symptoms include watery eyes and sneezing.  His symptoms began after being exposed to a granddaughter who had influenza. He is taking the flu shot patient.   He is on ACE inhibitor.   Review of Systems   History dry but it is not sore. He denies frontal headache, facial pain, dental pain, otic pain, otic discharge.     Objective:   Physical Exam General appears fatigued but adequately nourished; no acute distress or increased work of breathing is present.  No  lymphadenopathy about the head, neck, or axilla noted.   Eyes: No conjunctival inflammation or lid edema is present.   Ears:  External ear exam shows no significant lesions or deformities.  Otoscopic examination reveals clear canals, tympanic membranes are intact bilaterally without bulging, retraction, inflammation or discharge.  Nose:  External nasal examination shows no deformity or inflammation. Nasal mucosa are dry w/o  lesions or exudates. No septal dislocation or deviation.No obstruction to airflow.   Oral exam: Dental hygiene is good; lips and gums are healthy appearing.There is no oropharyngeal erythema or exudate noted.  Slightly hoarse  Neck:  No deformities,  masses, or tenderness noted.     Heart:  Normal rate and regular rhythm. S1 and S2 normal without gallop, murmur, click, rub or other extra sounds.   Lungs: Wheezing is audible at 6 feet over the upper airways. Chest reveals  diffuse low-grade rhonchi, especially in the right lower lobe.  Extremities:  No cyanosis, edema, or clubbing  noted    Skin: Warm & dry w/o jaundice or tenting.         Assessment & Plan:  #1 asthmatic bronchitis with purulent secretions  Plan: See orders

## 2013-01-21 NOTE — Progress Notes (Signed)
Pre visit review using our clinic review tool, if applicable. No additional management support is needed unless otherwise documented below in the visit note. 

## 2013-01-21 NOTE — Patient Instructions (Signed)
Your next office appointment will be determined based upon response to medications. Followup as needed for your this acute issue. Please report any significant change in your symptoms.  Advair one  inhalation every 12 hours; gargle and spit after use

## 2013-02-24 ENCOUNTER — Other Ambulatory Visit: Payer: Self-pay | Admitting: Cardiology

## 2013-03-05 ENCOUNTER — Encounter: Payer: Self-pay | Admitting: Internal Medicine

## 2013-03-05 ENCOUNTER — Ambulatory Visit (INDEPENDENT_AMBULATORY_CARE_PROVIDER_SITE_OTHER): Payer: Medicare Other | Admitting: Internal Medicine

## 2013-03-05 VITALS — BP 140/92 | HR 81 | Temp 100.3°F | Wt 215.8 lb

## 2013-03-05 DIAGNOSIS — J209 Acute bronchitis, unspecified: Secondary | ICD-10-CM

## 2013-03-05 MED ORDER — DOXYCYCLINE HYCLATE 100 MG PO TABS: 100.0000 mg | ORAL_TABLET | Freq: Two times a day (BID) | ORAL | Status: DC

## 2013-03-05 NOTE — Progress Notes (Signed)
   Subjective:    Patient ID: Samuel Moyer, male    DOB: 01/16/1941, 73 y.o.   MRN: [id number]  HPI  Symptoms began approximately one week ago as cough and chest congestion. Coughing is paroxysmal & last few minutes. Exhaling does bring on the cough.  He describes yellow and clear sputum. He has scant yellow nasal discharge. He did have a sore throat yesterday.  He has had a fever up to 101.8.  Minor symptoms include sneezing and wheezing.  He quit smoking in 1979. He is on ACE inhibitor.    Review of Systems  He denies any significant reflux symptoms.   He denies frontal headache, facial pain, otic pain, or otic discharge.     Objective:   Physical Exam General appearance:good health ;well nourished; no acute distress or increased work of breathing is present.  No  lymphadenopathy about the head, neck, or axilla noted.   Eyes: No conjunctival inflammation or lid edema is present.  Ears:  External ear exam shows no significant lesions or deformities.  Otoscopic examination reveals clear canals, tympanic membranes are intact bilaterally without bulging, retraction, inflammation or discharge. TMs dull. Aid on R  Nose:  External nasal examination shows no deformity or inflammation. Nasal mucosa are pink and moist without lesions or exudates. No septal dislocation or deviation.No obstruction to airflow.   Oral exam: Dental hygiene is good; lips and gums are healthy appearing.There is no oropharyngeal erythema or exudate noted.   Neck:  No deformities, masses, or tenderness noted.   Heart:  Normal rate and regular rhythm. S1 and S2 normal without gallop, murmur, click, rub or other extra sounds.   Lungs: no increased work of breathing.   He has low-grade rhonchi in the upper lung fields posteriorly  Extremities:  No cyanosis, edema, or clubbing  noted    Skin: Warm & dry          Assessment & Plan:  #1 acute bronchitis with low-grade bronchospasm  See orders

## 2013-03-05 NOTE — Patient Instructions (Signed)
Plain Mucinex (NOT D) for thick secretions ;force NON dairy fluids .   Nasal cleansing in the shower as discussed with lather of mild shampoo.After 10 seconds wash off lather while  exhaling through nostrils. Make sure that all residual soap is removed to prevent irritation.  Flonase OR Nasacort AQ 1 spray in each nostril twice a day as needed. Use the "crossover" technique into opposite nostril spraying toward opposite ear @ 45 degree angle, not straight up into nostril.  Use a Neti pot daily only  as needed for significant sinus congestion; going from open side to congested side . Plain Allegra (NOT D )  160 daily , Loratidine 10 mg , OR Zyrtec 10 mg @ bedtime  as needed for itchy eyes & sneezing.  Advair sample one inhalation every 12 hours; gargle and spit after use

## 2013-03-05 NOTE — Progress Notes (Signed)
Pre visit review using our clinic review tool, if applicable. No additional management support is needed unless otherwise documented below in the visit note. 

## 2013-03-11 ENCOUNTER — Other Ambulatory Visit: Payer: Self-pay | Admitting: Cardiology

## 2013-04-02 ENCOUNTER — Other Ambulatory Visit: Payer: Self-pay | Admitting: Internal Medicine

## 2013-04-02 DIAGNOSIS — E785 Hyperlipidemia, unspecified: Secondary | ICD-10-CM

## 2013-04-03 MED ORDER — PAROXETINE HCL 20 MG PO TABS: ORAL_TABLET | ORAL | Status: DC

## 2013-04-03 MED ORDER — ATORVASTATIN CALCIUM 10 MG PO TABS: ORAL_TABLET | ORAL | Status: DC

## 2013-04-03 NOTE — Telephone Encounter (Signed)
Rx's sent to the pharmacy by e-script.//AB/CMA 

## 2013-04-11 ENCOUNTER — Other Ambulatory Visit: Payer: Self-pay | Admitting: *Deleted

## 2013-04-11 DIAGNOSIS — E785 Hyperlipidemia, unspecified: Secondary | ICD-10-CM

## 2013-04-11 MED ORDER — ATORVASTATIN CALCIUM 10 MG PO TABS: ORAL_TABLET | ORAL | Status: DC

## 2013-04-11 NOTE — Telephone Encounter (Signed)
Sent email [facility]orvastatin was not sent to [facility]. Requesting 30 day sent to local pharmacy...Samuel Moyer

## 2013-04-23 ENCOUNTER — Encounter: Payer: Self-pay | Admitting: Pulmonary Disease

## 2013-04-23 ENCOUNTER — Ambulatory Visit (INDEPENDENT_AMBULATORY_CARE_PROVIDER_SITE_OTHER): Payer: Medicare Other | Admitting: Pulmonary Disease

## 2013-04-23 VITALS — BP 142/82 | HR 68 | Temp 97.5°F | Ht 68.0 in | Wt 226.0 lb

## 2013-04-23 DIAGNOSIS — G4733 Obstructive sleep apnea (adult) (pediatric): Secondary | ICD-10-CM

## 2013-04-23 NOTE — Patient Instructions (Signed)
Increase pressure to 12 cm & download in 1 month

## 2013-04-23 NOTE — Progress Notes (Signed)
   Subjective:    Patient ID: Samuel Moyer, male    DOB: 30-Apr-1940, 73 y.o.   MRN: [id number]  HPI  Ref - [name]  72/M,remote smoker (quit '79) retired [employer] worker for FU of obstructive sleep apnea  He has chronic atrial fibrillation & LV dysfunction , EF 40-45%, failed cardioversion in dec'11.  PSG 04/06/10 (wt 200) showed severe obstructive sleep apnea with AHI 66/h, nadir desatn 75% corrrected by CPAP 10 cm to AHI 4.5/h. Higher pressures were associated with emergence of central events.  Not needed advair or xopenex MDI since hosp dc  Download 4/30 -07/18/10 >>excellent usage 7h, residual AI 11/h  03/2011 Download shows good compliance, residual ahi 11/h  Download 03/2012 >> residual AHI 15/h on cpap 10 cm, good usage  04/23/2013  Chief Complaint  Patient presents with  . Follow-up    Pt wearing CPAP everynight. has no complaints today. Does not snore any longer. Feels rested and still occasionally takes a nap.    Download 03/2013 - AHI 21/h on 10 cm,EPR 3,  good usage He denies daytime somnolence, mask ok, pressure ok, no snoring  On benazepril - no cough or throat clearing  Review of Systems neg for any significant sore throat, dysphagia, itching, sneezing, nasal congestion or excess/ purulent secretions, fever, chills, sweats, unintended wt loss, pleuritic or exertional cp, hempoptysis, orthopnea pnd or change in chronic leg swelling. Also denies presyncope, palpitations, heartburn, abdominal pain, nausea, vomiting, diarrhea or change in bowel or urinary habits, dysuria,hematuria, rash, arthralgias, visual complaints, headache, numbness weakness or ataxia.     Objective:   Physical Exam  Gen. Pleasant, well-nourished, in no distress ENT - no lesions, no post nasal drip Neck: No JVD, no thyromegaly, no carotid bruits Lungs: no use of accessory muscles, no dullness to percussion, clear without rales or rhonchi  Cardiovascular: Rhythm regular, heart sounds  normal, no murmurs or  gallops, no peripheral edema Musculoskeletal: No deformities, no cyanosis or clubbing         Assessment & Plan:

## 2013-04-23 NOTE — Assessment & Plan Note (Signed)
Increase pressure to 12 cm & download in 1 month, likely related to wt gain  Weight loss encouraged, compliance with goal of at least 4-6 hrs every night is the expectation. Advised against medications with sedative side effects Cautioned against driving when sleepy - understanding that sleepiness will vary on a day to day basis

## 2013-04-24 ENCOUNTER — Encounter: Payer: Self-pay | Admitting: Cardiology

## 2013-04-24 ENCOUNTER — Ambulatory Visit (INDEPENDENT_AMBULATORY_CARE_PROVIDER_SITE_OTHER): Payer: Medicare Other | Admitting: Cardiology

## 2013-04-24 VITALS — BP 150/88 | HR 59 | Ht 68.0 in | Wt 222.0 lb

## 2013-04-24 DIAGNOSIS — E785 Hyperlipidemia, unspecified: Secondary | ICD-10-CM

## 2013-04-24 DIAGNOSIS — I4891 Unspecified atrial fibrillation: Secondary | ICD-10-CM | POA: Diagnosis not present

## 2013-04-24 DIAGNOSIS — I10 Essential (primary) hypertension: Secondary | ICD-10-CM

## 2013-04-24 DIAGNOSIS — I5022 Chronic systolic (congestive) heart failure: Secondary | ICD-10-CM | POA: Diagnosis not present

## 2013-04-24 NOTE — Patient Instructions (Addendum)
Stop taking lanoxin  Continue your other medication.  Restrict your sodium intake. Let me know if your swelling gets work or if your weight is going up.  I will see you in 6 months.

## 2013-04-24 NOTE — Progress Notes (Signed)
Samuel Moyer Date of Birth: 1940/08/13   History of Present Illness: Samuel Moyer is seen today for followup. He has a history of permanent atrial fibrillation. He also is a history of congestive heart failure with ejection fraction of 40-45%. Normal myoview in 2007. On followup today he reports he is doing very well. He has gained weight but attributes this to being less active this winter. He does eat out a lot so salt intake is an issue. No dyspnea. Denies increase in edema. Treated for URI in Dec. No dizziness.   Current Outpatient Prescriptions on File Prior to Visit  Medication Sig Dispense Refill  . ALPRAZolam (XANAX) 0.25 MG tablet Take 0.25 mg by mouth as needed.      Marland Kitchen amLODipine (NORVASC) 10 MG tablet TAKE 1 TABLET DAILY  90 tablet  1  . atorvastatin (LIPITOR) 10 MG tablet TAKE 1 TABLET DAILY  30 tablet  0  . benazepril (LOTENSIN) 40 MG tablet TAKE 1 TABLET DAILY  90 tablet  1  . furosemide (LASIX) 20 MG tablet TAKE 1 TABLET DAILY  90 tablet  1  . glucose blood (ONE TOUCH ULTRA TEST) test strip DX:250.00, LABS OVERDUE, Check blood sugar daily as directed  100 each  0  . metoprolol tartrate (LOPRESSOR) 25 MG tablet TAKE 2 TABLETS TWO TIMES DAILY  360 tablet  0  . Multiple Vitamin (MULTIVITAMIN) tablet Take 1 tablet by mouth daily.        [name] DELICA LANCETS 59D MISC 1 each by Other route daily. DX: 250.00, LABS OVERDUE, Check blood sugar daily as directed  100 each  0  . PARoxetine (PAXIL) 20 MG tablet TAKE ONE-HALF TABLET DAILY.  30 tablet  5  . PRADAXA 150 MG CAPS capsule TAKE 1 CAPSULE EVERY 12 HOURS  60 capsule  6   No current facility-administered medications on file prior to visit.    No Known Allergies  Past Medical History  Diagnosis Date  . Anxiety   . Claustrophobia     Occasionally when flying   . Hypertension   . Hyperlipidemia   . OSA (obstructive sleep apnea)     CPAP machine   . Atrial fibrillation   . Colitis   . Hemorrhoids   . Diabetes  mellitus, type 2   . PVC's (premature ventricular contractions)   . LV dysfunction     EF 40-45%    Past Surgical History  Procedure Laterality Date  . US echocardiography  11/15/2009    EF 40-45%  . Cardiovascular stress test  03/02/2010    EF 50%    History  Smoking status  . Former Smoker -- 1.00 packs/day for 16 years  . Types: Cigarettes  . Quit date: 06/30/1977  Smokeless tobacco  . Never Used    History  Alcohol Use No    Family History  Problem Relation Age of Onset  . Hypertension Father   . Heart attack Father     Age 74 (MI)  . Heart failure Father   . Colonic polyp Sister     and Father  . Colon cancer Neg Hx   . Stomach cancer Neg Hx   . Stroke Mother     Review of Systems: As noted in history of present illness  All other systems were reviewed and are negative.  Physical Exam: BP 150/88  Pulse 59  Ht 5\' 8"  (1.727 m)  Wt 222 lb (100.699 kg)  BMI 33.76 kg/m2 The patient is alert  and oriented x 3.   The skin is warm and dry.    The HEENT exam  is normal.  The carotids are 2+ without bruits.  There is no thyromegaly.  There is no JVD.  The lungs are clear.   The heart exam reveals  an irregular  rate with a normal S1 and S2.  There are no murmurs, gallops, or rubs.  The PMI is not displaced.   Abdominal exam reveals good bowel sounds. Exam of the legs reveals  1+ pretibial  edema.   The distal pulses are intact.  Cranial nerves II - XII are intact.  Motor and sensory functions are intact.  The gait is normal.  LABORATORY DATA: Lab Results  Component Value Date   WBC 14.8* 04/07/2011   HGB 14.6 04/07/2011   HCT 41.8 04/07/2011   PLT 209 04/07/2011   GLUCOSE 115* 12/23/2012   CHOL 161 12/23/2012   TRIG 127.0 12/23/2012   HDL 46.80 12/23/2012   LDLCALC 89 12/23/2012   ALT 26 12/23/2012   AST 25 12/23/2012   NA 139 12/23/2012   K 3.9 12/23/2012   CL 105 12/23/2012   CREATININE 1.0 12/23/2012   BUN 13 12/23/2012   CO2 26 12/23/2012   TSH 0.95  12/23/2012   PSA 0.63 09/12/2007   HGBA1C 6.5 12/23/2012   MICROALBUR 2.6* 12/23/2012   Ecg: Afib with rate 59 bpm. Nonspecific ST-T abnormality.  Assessment / Plan: 1. Permanent atrial fibrillation. Rate is relatively slow. Will DC lanoxin and continue current metoprolol dose. He is on chronic anticoagulation with Pradaxa. Renal function is normal.  2. Congestive heart failure with chronic systolic dysfunction. Ejection fraction is moderately reduced. continue ACE inhibitor, beta blocker, and diuretic therapy. Need to pay more attention to sodium restriction. If weight goes up or edema worsens will need to increase lasix.  3. Hypertension, well controlled.

## 2013-04-28 ENCOUNTER — Other Ambulatory Visit: Payer: Self-pay | Admitting: Internal Medicine

## 2013-04-29 NOTE — Telephone Encounter (Signed)
OK X1 

## 2013-05-07 ENCOUNTER — Other Ambulatory Visit: Payer: Self-pay | Admitting: Cardiology

## 2013-05-12 DIAGNOSIS — H903 Sensorineural hearing loss, bilateral: Secondary | ICD-10-CM | POA: Diagnosis not present

## 2013-05-16 DIAGNOSIS — H6120 Impacted cerumen, unspecified ear: Secondary | ICD-10-CM | POA: Diagnosis not present

## 2013-05-26 ENCOUNTER — Other Ambulatory Visit: Payer: Self-pay | Admitting: Cardiology

## 2013-06-13 ENCOUNTER — Other Ambulatory Visit (INDEPENDENT_AMBULATORY_CARE_PROVIDER_SITE_OTHER): Payer: Medicare Other

## 2013-06-13 ENCOUNTER — Ambulatory Visit (INDEPENDENT_AMBULATORY_CARE_PROVIDER_SITE_OTHER): Payer: Medicare Other | Admitting: Internal Medicine

## 2013-06-13 ENCOUNTER — Encounter: Payer: Self-pay | Admitting: Internal Medicine

## 2013-06-13 VITALS — BP 130/82 | HR 73 | Temp 97.4°F | Resp 15 | Wt 225.1 lb

## 2013-06-13 DIAGNOSIS — R7309 Other abnormal glucose: Secondary | ICD-10-CM | POA: Diagnosis not present

## 2013-06-13 DIAGNOSIS — F411 Generalized anxiety disorder: Secondary | ICD-10-CM

## 2013-06-13 LAB — TSH: TSH: 1.3 u[IU]/mL (ref 0.35–4.50)

## 2013-06-13 LAB — MICROALBUMIN / CREATININE URINE RATIO
Creatinine,U: 88.5 mg/dL
MICROALB/CREAT RATIO: 2 mg/g (ref 0.0–30.0)
Microalb, Ur: 1.8 mg/dL (ref 0.0–1.9)

## 2013-06-13 LAB — HEMOGLOBIN A1C: Hgb A1c MFr Bld: 6.2 % (ref 4.6–6.5)

## 2013-06-13 MED ORDER — PAROXETINE HCL 20 MG PO TABS: ORAL_TABLET | ORAL | Status: DC

## 2013-06-13 NOTE — Assessment & Plan Note (Signed)
TSH 

## 2013-06-13 NOTE — Assessment & Plan Note (Signed)
A1c & urine microalbumin  

## 2013-06-13 NOTE — Progress Notes (Signed)
   Subjective:    Patient ID: Samuel Moyer, male    DOB: [birth year], 73 y.o.   MRN: [id number]  HPI He has been on Paxil  for at least 1.5 years. This was initially started because of claustrophobia and panic. He has reduced the dose to one half pill in the past year with no ill effects.  He feels quite stable on this low dose.  He is due for followup of his A1c; it was 6.5% in November 2014  Review of Systems  He specifically denies significant anxiety, irritability, tracheostomy difficulty, panic, or depression  He has no anorexia or sleep disruption. He does use CPAP with good response.  He has not been monitoring his glucoses at home.        Objective:   Physical Exam Gen.: well-nourished in appearance. Alert, appropriate and cooperative throughout exam.  Head: Normocephalic without obvious abnormalities Eyes: No corneal or conjunctival inflammation noted. Pupils equal round reactive to light and accommodation. Extraocular motion intact. Nolid lag or proptosis. Ptosis bilaterally Neck: No deformities, masses, or tenderness noted. Thyroid normal Lungs: Normal respiratory effort; chest expands symmetrically. Lungs are clear to auscultation without rales, wheezes, or increased work of breathing. Heart: Normal rate and rhythm. Normal S1 and S2. No gallop, click, or rub.No murmur.          Musculoskeletal/extremities: No deformity or scoliosis noted of  the thoracic or lumbar spine.  No clubbing, cyanosis, edema, or significant extremity  deformity noted. Tone & strength normal. Hand joints : minor isolated DIP changes Fingernail health good.  Neurologic: Alert and oriented x3. Deep tendon reflexes symmetrical and normal. No tremor Gait normal  Skin: Intact without suspicious lesions or rashes. Lymph: No cervical, axillary lymphadenopathy present. Psych: Mood and affect are normal. Normally interactive                                                                                          Assessment & Plan:  See Current Assessment & Plan in Problem List under specific Diagnosis

## 2013-06-13 NOTE — Progress Notes (Signed)
Pre visit review using our clinic review tool, if applicable. No additional management support is needed unless otherwise documented below in the visit note. 

## 2013-06-13 NOTE — Patient Instructions (Addendum)
Your next office appointment will be determined based upon review of your pending labs. Those instructions will be transmitted to you through My Chart . 

## 2013-06-14 ENCOUNTER — Other Ambulatory Visit: Payer: Self-pay | Admitting: Internal Medicine

## 2013-07-04 DIAGNOSIS — H40019 Open angle with borderline findings, low risk, unspecified eye: Secondary | ICD-10-CM | POA: Diagnosis not present

## 2013-08-22 ENCOUNTER — Telehealth: Payer: Self-pay | Admitting: Cardiology

## 2013-08-22 NOTE — Telephone Encounter (Signed)
Would only hold Pradaxa for 48 hours for tooth extraction.  Obaloluwa Delatte Martinique MD, [facility]

## 2013-08-22 NOTE — Telephone Encounter (Signed)
Returned call to [facility] with Dr.Flynn's office patient is scheduled to have a tooth extraction and wanted to know how long patient needs to hold pradaxa.Message sent to [place] for advice.

## 2013-08-22 NOTE — Telephone Encounter (Signed)
She need to know how long pt need to be off his blood thinner before an extraction?

## 2013-09-28 ENCOUNTER — Other Ambulatory Visit: Payer: Self-pay | Admitting: Cardiology

## 2013-10-17 ENCOUNTER — Other Ambulatory Visit: Payer: Self-pay | Admitting: Cardiology

## 2013-11-12 DIAGNOSIS — Z23 Encounter for immunization: Secondary | ICD-10-CM | POA: Diagnosis not present

## 2013-12-05 ENCOUNTER — Other Ambulatory Visit: Payer: Self-pay

## 2013-12-05 ENCOUNTER — Ambulatory Visit (INDEPENDENT_AMBULATORY_CARE_PROVIDER_SITE_OTHER): Payer: Medicare Other

## 2013-12-05 ENCOUNTER — Ambulatory Visit (INDEPENDENT_AMBULATORY_CARE_PROVIDER_SITE_OTHER): Payer: Medicare Other | Admitting: Family Medicine

## 2013-12-05 VITALS — BP 132/82 | HR 77 | Temp 98.6°F | Resp 18 | Ht 67.0 in | Wt 233.0 lb

## 2013-12-05 DIAGNOSIS — M25572 Pain in left ankle and joints of left foot: Secondary | ICD-10-CM

## 2013-12-05 DIAGNOSIS — S93402A Sprain of unspecified ligament of left ankle, initial encounter: Secondary | ICD-10-CM | POA: Diagnosis not present

## 2013-12-05 MED ORDER — DABIGATRAN ETEXILATE MESYLATE 150 MG PO CAPS: ORAL_CAPSULE | ORAL | Status: DC

## 2013-12-05 NOTE — Patient Instructions (Signed)
It looks like you have an ankle sprain. Use the walking boot for support as needed, and ice/ elevate your ankle when you can.  Use the crutches or a walker as needed for support.  If your ankle is not getting better over the next week or so please let me know- Sooner if worse.   You can use tylenol as needed for pain.

## 2013-12-05 NOTE — Progress Notes (Signed)
[facility] [address], [place] [address] [phone]  Date:  12/05/2013   Name:  Samuel Moyer   DOB:  06-22-1940   MRN:  [id number]  PCP:  Unice Cobble, MD    Chief Complaint: Ankle Injury   History of Present Illness:  Samuel Moyer is a 73 y.o. very pleasant male patient who presents with the following:  He missed a step today, fell and twisted his right ankle. It did not hurt right away, but started to hurt after he sat down and had lunch.  He thinks he inverted the ankle.  No head injury when he fell.   He is unhurt except for the ankle injury  Patient Active Problem List   Diagnosis Date Noted  . OSA (obstructive sleep apnea) 05/24/2010  . ATRIAL FIBRILLATION  01/26/2010  . Chronic systolic heart failure 54/00/8676  . UNSPECIFIED CARDIAC DYSRHYTHMIA 01/14/2010  . 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 HYPERGLYCEMIA, FASTING 12/10/2008  . HYPERLIPIDEMIA 10/21/2007  . ANXIETY 10/21/2007  . HYPERTENSION 02/26/2006  . COLONIC POLYPS, HX OF 02/26/2006    Past Medical History  Diagnosis Date  . Anxiety   . Claustrophobia     Occasionally when flying   . Hypertension   . Hyperlipidemia   . OSA (obstructive sleep apnea)     CPAP machine   . Atrial fibrillation   . Colitis   . Hemorrhoids   . Diabetes mellitus, type 2   . PVC's (premature ventricular contractions)   . LV dysfunction     EF 40-45%  . CHF (congestive heart failure)   . A-fib     Past Surgical History  Procedure Laterality Date  . US echocardiography  11/15/2009    EF 40-45%  . Cardiovascular stress test  03/02/2010    EF 50%    History  Substance Use Topics  . Smoking status: Former Smoker -- 1.00 packs/day for 16 years    Types: Cigarettes    Quit date: 06/30/1977  . Smokeless tobacco: Never Used  . Alcohol Use: No    Family History  Problem Relation Age of Onset  . Hypertension Father   . Heart attack Father     Age 5 (MI)  . Heart failure Father   .  Colonic polyp Sister     and Father  . Colon cancer Neg Hx   . Stomach cancer Neg Hx   . Stroke Mother     No Known Allergies  Medication list has been reviewed and updated.  Current Outpatient Prescriptions on File Prior to Visit  Medication Sig Dispense Refill  . ALPRAZolam (XANAX) 0.25 MG tablet Take 0.25 mg by mouth as needed.    Marland Kitchen amLODipine (NORVASC) 10 MG tablet TAKE 1 TABLET DAILY 90 tablet 3  . atorvastatin (LIPITOR) 10 MG tablet TAKE 1 TABLET DAILY 30 tablet 0  . benazepril (LOTENSIN) 40 MG tablet TAKE 1 TABLET DAILY 90 tablet 3  . dabigatran (PRADAXA) 150 MG CAPS capsule TAKE 1 CAPSULE EVERY 12 HOURS 60 capsule 6  . furosemide (LASIX) 20 MG tablet TAKE 1 TABLET DAILY 90 tablet 0  . glucose blood (ONE TOUCH ULTRA TEST) test strip DX:250.00, LABS OVERDUE, Check blood sugar daily as directed 100 each 0  . metoprolol tartrate (LOPRESSOR) 25 MG tablet TAKE 2 TABLETS TWICE A DAY 120 tablet 0  . Multiple Vitamin (MULTIVITAMIN) tablet Take 1 tablet by mouth daily.      [name] DELICA LANCETS 19J MISC  1 each by Other route daily. DX: 250.00, LABS OVERDUE, Check blood sugar daily as directed 100 each 0  . PARoxetine (PAXIL) 20 MG tablet TAKE ONE-HALF TABLET DAILY. 45 tablet 3   No current facility-administered medications on file prior to visit.    Review of Systems:  As per HPI- otherwise negative.   Physical Examination: Filed Vitals:   12/05/13 1547  BP: 132/82  Pulse: 77  Temp: 98.6 F (37 C)  Resp: 18   Filed Vitals:   12/05/13 1547  Height: 5\' 7"  (1.702 m)  Weight: 233 lb (105.688 kg)   Body mass index is 36.48 kg/(m^2). Ideal Body Weight: Weight in (lb) to have BMI = 25: 159.3  GEN: WDWN, NAD, Non-toxic, A & O x 3, obese, looks well HEENT: Atraumatic, Normocephalic. Neck supple. No masses, No LAD. Ears and Nose: No external deformity. CV: RRR, No M/G/R. No JVD. No thrill. No extra heart sounds. PULM: CTA B, no wheezes, crackles, rhonchi. No  retractions. No resp. distress. No accessory muscle use. EXTR: No c/c/e NEURO favoring left PSYCH: Normally interactive. Conversant. Not depressed or anxious appearing.  Calm demeanor.  Left lower limb: Medial ankle is swollen and tender,  Achilles intact, foot is negative.  No redness or heat  [facility] reading (PRIMARY) by  Dr. Lorelei Pont. Left ankle: negative for fracture LEFT ANKLE COMPLETE - 3+ VIEW  COMPARISON: None.  FINDINGS: There is a large amount of soft tissue swelling medially. The ankle joint mortise is preserved. The talar dome is intact. There is no acute malleolar fracture. There are mild degenerative changes associated with both medial and lateral aspects of the joint. The talus and calcaneus are intact. There is a small Achilles region calcaneal spur.  IMPRESSION: There is a large amount of soft tissue swelling medially over the ankle. No acute fracture nor dislocation is demonstrated.  Assessment and Plan: Left ankle pain - Plan: DG Ankle Complete Left  Left ankle sprain, initial encounter  Ankle sprain- placed in a CAM boot and given crutches.  He will also ice and elevate the ankle.   See patient instructions for more details.     Signed Lamar Blinks, MD

## 2013-12-10 ENCOUNTER — Telehealth: Payer: Self-pay

## 2013-12-10 ENCOUNTER — Ambulatory Visit (INDEPENDENT_AMBULATORY_CARE_PROVIDER_SITE_OTHER): Payer: Medicare Other | Admitting: Family Medicine

## 2013-12-10 ENCOUNTER — Ambulatory Visit (INDEPENDENT_AMBULATORY_CARE_PROVIDER_SITE_OTHER): Payer: Medicare Other

## 2013-12-10 VITALS — BP 140/72 | HR 75 | Temp 97.8°F | Resp 18

## 2013-12-10 DIAGNOSIS — M25572 Pain in left ankle and joints of left foot: Secondary | ICD-10-CM

## 2013-12-10 MED ORDER — COLCHICINE 0.6 MG PO TABS: ORAL_TABLET | ORAL | Status: DC

## 2013-12-10 MED ORDER — HYDROCODONE-ACETAMINOPHEN 5-325 MG PO TABS: 1.0000 | ORAL_TABLET | Freq: Three times a day (TID) | ORAL | Status: DC | PRN

## 2013-12-10 NOTE — Telephone Encounter (Signed)
Sunday Spillers states her husband left ankle is worst and he is in a lot of pain. Please call [phone]

## 2013-12-10 NOTE — Telephone Encounter (Signed)
Per OV- RTC if pain does not get better or worsens. Spoke to [place]- she is going to bring him in. Advised her to come get one of the staff to assist pt into the building with a wheelchair. Pt is not able to bear weight at all even in the boot.

## 2013-12-10 NOTE — Patient Instructions (Addendum)
Continue to ice and elevate your ankle!  You can try the hydrocodone pills as needed for pain-  Remember these can make you feel drowsy.  Do not take when you need to drive or combine with xanax Let me know if your ankle is not feeling better in the next few days- Sooner if worse.   Use the colchine as directed for possible gout- hold your atorvastatin for a couple of days.

## 2013-12-10 NOTE — Progress Notes (Signed)
[facility] [address], [place] [address] [phone]  Date:  12/10/2013   Name:  Samuel Moyer   DOB:  1940-11-15   MRN:  [id number]  PCP:  Unice Cobble, MD    Chief Complaint: Follow-up   History of Present Illness:  Samuel Moyer is a 73 y.o. very pleasant male patient who presents with the following:  I saw him last week after a left ankle sprain.  He had been doing pretty well and was able to walk with just his CAM boot.  However this am he went down some stairs with just a cane and his ankle seemed to get worse again. He was not wearing his CAM when he went down the stairs.   He now notes more swelling, bruising and pain.    He does have a walker that he can use as needed He does have a history of gout but gets this just on occaion  Patient Active Problem List   Diagnosis Date Noted  . OSA (obstructive sleep apnea) 05/24/2010  . ATRIAL FIBRILLATION  01/26/2010  . Chronic systolic heart failure 10/22/3005  . UNSPECIFIED CARDIAC DYSRHYTHMIA 01/14/2010  . 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 HYPERGLYCEMIA, FASTING 12/10/2008  . HYPERLIPIDEMIA 10/21/2007  . ANXIETY 10/21/2007  . HYPERTENSION 02/26/2006  . COLONIC POLYPS, HX OF 02/26/2006    Past Medical History  Diagnosis Date  . Anxiety   . Claustrophobia     Occasionally when flying   . Hypertension   . Hyperlipidemia   . OSA (obstructive sleep apnea)     CPAP machine   . Atrial fibrillation   . Colitis   . Hemorrhoids   . Diabetes mellitus, type 2   . PVC's (premature ventricular contractions)   . LV dysfunction     EF 40-45%  . CHF (congestive heart failure)   . A-fib     Past Surgical History  Procedure Laterality Date  . US echocardiography  11/15/2009    EF 40-45%  . Cardiovascular stress test  03/02/2010    EF 50%    History  Substance Use Topics  . Smoking status: Former Smoker -- 1.00 packs/day for 16 years    Types: Cigarettes    Quit date: 06/30/1977  .  Smokeless tobacco: Never Used  . Alcohol Use: No    Family History  Problem Relation Age of Onset  . Hypertension Father   . Heart attack Father     Age 110 (MI)  . Heart failure Father   . Colonic polyp Sister     and Father  . Colon cancer Neg Hx   . Stomach cancer Neg Hx   . Stroke Mother     No Known Allergies  Medication list has been reviewed and updated.  Current Outpatient Prescriptions on File Prior to Visit  Medication Sig Dispense Refill  . ALPRAZolam (XANAX) 0.25 MG tablet Take 0.25 mg by mouth as needed.    Marland Kitchen amLODipine (NORVASC) 10 MG tablet TAKE 1 TABLET DAILY 90 tablet 3  . atorvastatin (LIPITOR) 10 MG tablet TAKE 1 TABLET DAILY 30 tablet 0  . benazepril (LOTENSIN) 40 MG tablet TAKE 1 TABLET DAILY 90 tablet 3  . dabigatran (PRADAXA) 150 MG CAPS capsule TAKE 1 CAPSULE EVERY 12 HOURS 60 capsule 6  . furosemide (LASIX) 20 MG tablet TAKE 1 TABLET DAILY 90 tablet 0  . glucose blood (ONE TOUCH ULTRA TEST) test strip DX:250.00, LABS OVERDUE, Check blood sugar daily as  directed 100 each 0  . metoprolol tartrate (LOPRESSOR) 25 MG tablet TAKE 2 TABLETS TWICE A DAY 120 tablet 0  . Multiple Vitamin (MULTIVITAMIN) tablet Take 1 tablet by mouth daily.      [name] DELICA LANCETS 16X MISC 1 each by Other route daily. DX: 250.00, LABS OVERDUE, Check blood sugar daily as directed 100 each 0  . PARoxetine (PAXIL) 20 MG tablet TAKE ONE-HALF TABLET DAILY. 45 tablet 3   No current facility-administered medications on file prior to visit.    Review of Systems:  As per HPI- otherwise negative.   Physical Examination: Filed Vitals:   12/10/13 1536  BP: 140/72  Pulse: 75  Temp: 97.8 F (36.6 C)  Resp: 18   There were no vitals filed for this visit. There is no weight on file to calculate BMI. Ideal Body Weight:    GEN: WDWN, NAD, Non-toxic, A & O x 3, looks well HEENT: Atraumatic, Normocephalic. Neck supple. No masses, No LAD. Ears and Nose: No external  deformity. CV: RRR, No M/G/R. No JVD. No thrill. No extra heart sounds. PULM: CTA B, no wheezes, crackles, rhonchi. No retractions. No resp. distress. No accessory muscle use. EXTR: No c/c/e NEURO he is sitting in a WC PSYCH: Normally interactive. Conversant. Not depressed or anxious appearing.  Calm demeanor.  Left ankle: medial right ankle is tender and swollen, warm and slightly red.    [facility] reading (PRIMARY) by  Dr. Lorelei Pont. Left ankle: negative LEFT ANKLE COMPLETE - 3+ VIEW  COMPARISON: 12/07/2013  FINDINGS: There is marked medial soft tissue swelling over the distal calf, ankle and hindfoot. No underlying acute bony abnormality. No fracture, subluxation or dislocation.  IMPRESSION: No acute bony abnormality.   Assessment and Plan: Left ankle pain - Plan: DG Ankle Complete Left, HYDROcodone-acetaminophen (NORCO/VICODIN) 5-325 MG per tablet, colchicine 0.6 MG tablet  Ankle pain- recurrent after going down stairs on a sprained ankle. The ankle is also warm and slightly red- he has a history of gout and is not sure if this could be gout.  Will try vicodin as needed- asked him to try a 1/2 tablet first as he does not generally take much medication He will also try colchicine to see if it might be helpful He will let me know if not not better in the next few days  Meds ordered this encounter  Medications  . HYDROcodone-acetaminophen (NORCO/VICODIN) 5-325 MG per tablet    Sig: Take 1 tablet by mouth every 8 (eight) hours as needed.    Dispense:  20 tablet    Refill:  0  . colchicine 0.6 MG tablet    Sig: Take 2 pills once, then take one more an hour later.  Repeat as needed for gout attack    Dispense:  30 tablet    Refill:  0      Signed Lamar Blinks, MD

## 2013-12-10 NOTE — Telephone Encounter (Signed)
Ok sounds like the best plan

## 2013-12-12 ENCOUNTER — Telehealth: Payer: Self-pay | Admitting: Family Medicine

## 2013-12-12 NOTE — Telephone Encounter (Signed)
Patients spouse call back again stated she still has not received a call regarding her husband. Patient is concerned about her husbands condition and feels he needs a prescription. Patient uses [facility] at [address] Call back number is [phone]

## 2013-12-12 NOTE — Telephone Encounter (Signed)
Patients wife called back wanted to add to the previous message that her husbands left leg is still red/swollen. Sylvia's call back number is [phone]

## 2013-12-12 NOTE — Telephone Encounter (Signed)
Patient's wife states that colchicine 0.6 MG tablet [[id number]]  Is causing her husband to have terrible diarrhea. Can he have another medication to help with his gout? Also states that his ankle is still red, tender, and swollen.   [phone]

## 2013-12-12 NOTE — Telephone Encounter (Signed)
Spoke to patient in behalf of [place] and patient is returning to clinic. He stopped taking the colchicine  Completely for today and no gi issues like the diarrhea. The hydrocodone is working for the pain and patient is not needing to take a whole pill but half to help with the pain. He is elevating foot as instructed. He will return to clinic for evaluation. Spouse will bring him in tomorrow at 8am.

## 2013-12-12 NOTE — Telephone Encounter (Signed)
How often is he taking the colchicine? He should not take it more than 1 tablet twice a day at this point.  If his symptoms are WORSE, he should RTC for re-evaluation.  Does the hydrocodone prescribed help with the pain when he takes it? Is he elevating his foot, or up and about?  Alternate anti-inflammatory products increase bleeding risk, and we don't use them when people are taking an anticoagulant like Pradaxa.

## 2013-12-13 ENCOUNTER — Encounter: Payer: Self-pay | Admitting: Family Medicine

## 2013-12-13 ENCOUNTER — Ambulatory Visit (INDEPENDENT_AMBULATORY_CARE_PROVIDER_SITE_OTHER): Payer: Medicare Other | Admitting: Family Medicine

## 2013-12-13 VITALS — BP 142/98 | HR 86 | Temp 98.7°F | Resp 18 | Ht 68.0 in | Wt 228.0 lb

## 2013-12-13 DIAGNOSIS — S93402D Sprain of unspecified ligament of left ankle, subsequent encounter: Secondary | ICD-10-CM

## 2013-12-13 DIAGNOSIS — S9002XD Contusion of left ankle, subsequent encounter: Secondary | ICD-10-CM | POA: Diagnosis not present

## 2013-12-13 DIAGNOSIS — L03119 Cellulitis of unspecified part of limb: Secondary | ICD-10-CM | POA: Diagnosis not present

## 2013-12-13 DIAGNOSIS — M25572 Pain in left ankle and joints of left foot: Secondary | ICD-10-CM | POA: Diagnosis not present

## 2013-12-13 DIAGNOSIS — M25579 Pain in unspecified ankle and joints of unspecified foot: Secondary | ICD-10-CM | POA: Diagnosis not present

## 2013-12-13 LAB — POCT CBC
Granulocyte percent: 51.5 %G (ref 37–80)
HCT, POC: 43.5 % (ref 43.5–53.7)
Hemoglobin: 14.2 g/dL (ref 14.1–18.1)
Lymph, poc: 5.3 — AB (ref 0.6–3.4)
MCH: 31.5 pg — AB (ref 27–31.2)
MCHC: 32.6 g/dL (ref 31.8–35.4)
MCV: 96.6 fL (ref 80–97)
MID (cbc): 0.9 (ref 0–0.9)
MPV: 6.4 fL (ref 0–99.8)
POC GRANULOCYTE: 6.6 (ref 2–6.9)
POC LYMPH PERCENT: 41.4 %L (ref 10–50)
POC MID %: 7.1 %M (ref 0–12)
Platelet Count, POC: 246 10*3/uL (ref 142–424)
RBC: 4.5 M/uL — AB (ref 4.69–6.13)
RDW, POC: 16.2 %
WBC: 12.9 10*3/uL — AB (ref 4.6–10.2)

## 2013-12-13 LAB — URIC ACID: Uric Acid, Serum: 6.8 mg/dL (ref 4.0–7.8)

## 2013-12-13 LAB — POCT SEDIMENTATION RATE: POCT SED RATE: 47 mm/hr — AB (ref 0–22)

## 2013-12-13 MED ORDER — CEPHALEXIN 500 MG PO CAPS: 500.0000 mg | ORAL_CAPSULE | Freq: Three times a day (TID) | ORAL | Status: DC

## 2013-12-13 NOTE — Progress Notes (Signed)
Subjective: 73 year old man who is here for the third time with regard to his left ankle which he injured 8 days ago.. It is continued to hurt him quite a lot. His gotten red and swollen above the medial malleolus. He was treated for possible gout. It is continued to be very painful there. He did not tolerate the colchicine very well due to the diarrhea. The area has felt hot to touch. He says it was redder yesterday than it is today.  Objective: Visibly swollen left ankle. There is a specific swollen area with moderate erythema about 5 cm above the malleolus on the medial aspect of the left ankle. Quite tender in that area. He has erythema up to the base of the calf muscle. There is erythema down to just past the ankle joint itself. Below that there is ecchymosis. The whole foot is puffy, but he can move his toes well.  Assessment: Status post sprain and contusion left ankle with pain and probable cellulitis  Plan: CBC, sedimentation rate, uric acid  Results for orders placed or performed in visit on 12/13/13  POCT CBC  Result Value Ref Range   WBC 12.9 (A) 4.6 - 10.2 K/uL   Lymph, poc 5.3 (A) 0.6 - 3.4   POC LYMPH PERCENT 41.4 10 - 50 %L   MID (cbc) 0.9 0 - 0.9   POC MID % 7.1 0 - 12 %M   POC Granulocyte 6.6 2 - 6.9   Granulocyte percent 51.5 37 - 80 %G   RBC 4.50 (A) 4.69 - 6.13 M/uL   Hemoglobin 14.2 14.1 - 18.1 g/dL   HCT, POC 43.5 43.5 - 53.7 %   MCV 96.6 80 - 97 fL   MCH, POC 31.5 (A) 27 - 31.2 pg   MCHC 32.6 31.8 - 35.4 g/dL   RDW, POC 16.2 %   Platelet Count, POC 246 142 - 424 K/uL   MPV 6.4 0 - 99.8 fL   Assessment: This is consistent with some cellulitis developing in the hematoma from the contusion.  Plan: Keflex 500 mg 3 times daily Return in 3-4 days for recheck, sooner if it looks at all worse Patient is to take some digital pictures of it each day to document the nature of the area.

## 2013-12-13 NOTE — Patient Instructions (Signed)
Take the cephalexin one pill 3 times daily  Continue to keep foot elevated. Wear the boot if you are going to be ambulatory for long stretches.  Return at any time if worse, otherwise plan to return in about 4 days which would be Wednesday.

## 2013-12-17 ENCOUNTER — Ambulatory Visit (INDEPENDENT_AMBULATORY_CARE_PROVIDER_SITE_OTHER): Payer: Medicare Other | Admitting: Family Medicine

## 2013-12-17 VITALS — BP 122/70 | HR 90 | Temp 97.9°F | Resp 18 | Ht 68.0 in | Wt 229.4 lb

## 2013-12-17 DIAGNOSIS — L03116 Cellulitis of left lower limb: Secondary | ICD-10-CM | POA: Diagnosis not present

## 2013-12-17 DIAGNOSIS — S9002XD Contusion of left ankle, subsequent encounter: Secondary | ICD-10-CM | POA: Diagnosis not present

## 2013-12-17 LAB — CBC WITH DIFFERENTIAL/PLATELET
Basophils Absolute: 0 10*3/uL (ref 0.0–0.1)
Basophils Relative: 0 % (ref 0–1)
EOS PCT: 2 % (ref 0–5)
Eosinophils Absolute: 0.2 10*3/uL (ref 0.0–0.7)
HCT: 42.1 % (ref 39.0–52.0)
HEMOGLOBIN: 14.2 g/dL (ref 13.0–17.0)
LYMPHS ABS: 4.6 10*3/uL — AB (ref 0.7–4.0)
LYMPHS PCT: 40 % (ref 12–46)
MCH: 31.6 pg (ref 26.0–34.0)
MCHC: 33.7 g/dL (ref 30.0–36.0)
MCV: 93.8 fL (ref 78.0–100.0)
MPV: 9.5 fL (ref 9.4–12.4)
Monocytes Absolute: 0.9 10*3/uL (ref 0.1–1.0)
Monocytes Relative: 8 % (ref 3–12)
NEUTROS PCT: 50 % (ref 43–77)
Neutro Abs: 5.8 10*3/uL (ref 1.7–7.7)
Platelets: 290 10*3/uL (ref 150–400)
RBC: 4.49 MIL/uL (ref 4.22–5.81)
RDW: 14.6 % (ref 11.5–15.5)
WBC: 11.6 10*3/uL — ABNORMAL HIGH (ref 4.0–10.5)

## 2013-12-17 LAB — POCT SEDIMENTATION RATE: POCT SED RATE: 56 mm/hr — AB (ref 0–22)

## 2013-12-17 NOTE — Progress Notes (Signed)
Subjective: Here for a recheck of his ankle. He has a series of photos formerly to see its progress.  Objective: There is still swelling and fluctuance on the medial aspect of the distal tibia. The erythema is less pronounced. The foot and ankle are swollen.  Assessment: Hematoma left ankle, slowly improving with a probable secondary cellulitis  Plan: Continue to monitor closely. He is to come in anytime if additional concerns, otherwise are like to see it in 2 weeks if it is not almost completely gone away.

## 2013-12-17 NOTE — Patient Instructions (Signed)
Continue current medications until the antibiotics are completed. If it gets redder after that come right in, otherwise give it about 2 more weeks, until around the first couple of days of December, and come back in for me to check it one more time if you have any significant swelling still. However if it is doing great you do not need to return.  Wear the Ace wrap when you're going to be up and about at all, otherwise you can remove it when you're laying down or sitting where you can keep it elevated

## 2013-12-28 ENCOUNTER — Other Ambulatory Visit: Payer: Self-pay | Admitting: Cardiology

## 2014-01-05 DIAGNOSIS — H40013 Open angle with borderline findings, low risk, bilateral: Secondary | ICD-10-CM | POA: Diagnosis not present

## 2014-01-28 ENCOUNTER — Encounter: Payer: Self-pay | Admitting: Cardiology

## 2014-01-28 ENCOUNTER — Ambulatory Visit (INDEPENDENT_AMBULATORY_CARE_PROVIDER_SITE_OTHER): Payer: Medicare Other | Admitting: Cardiology

## 2014-01-28 VITALS — BP 146/82 | HR 82 | Ht 68.0 in | Wt 233.8 lb

## 2014-01-28 DIAGNOSIS — E7800 Pure hypercholesterolemia, unspecified: Secondary | ICD-10-CM

## 2014-01-28 DIAGNOSIS — I482 Chronic atrial fibrillation, unspecified: Secondary | ICD-10-CM

## 2014-01-28 DIAGNOSIS — I10 Essential (primary) hypertension: Secondary | ICD-10-CM

## 2014-01-28 DIAGNOSIS — I5022 Chronic systolic (congestive) heart failure: Secondary | ICD-10-CM

## 2014-01-28 MED ORDER — FUROSEMIDE 40 MG PO TABS: 40.0000 mg | ORAL_TABLET | Freq: Every day | ORAL | Status: DC

## 2014-01-28 NOTE — Patient Instructions (Signed)
Increase Lasix to 40 mg daily  Continue your other therapy  Avoid sodium  Try and exercise (walk) 5 days a week  I will see you in 6 months.

## 2014-01-28 NOTE — Progress Notes (Signed)
Samuel Moyer Date of Birth: 10-08-1940   History of Present Illness: Samuel Moyer is seen today for followup of CHF. He has a history of permanent atrial fibrillation. He also is a history of congestive heart failure with ejection fraction of 40-45%. Normal myoview in 2007. On followup today he reports he sprained his ankle 2 months ago and had a lot of swelling and bruising. X rays were negative for fracture. Left ankle is still swollen but improved. Did note some dyspnea when he had to walk up several flights of stairs at a basketball game.  Weight is up 11 lbs since March. No dizziness or chest pain.  Current Outpatient Prescriptions on File Prior to Visit  Medication Sig Dispense Refill  . amLODipine (NORVASC) 10 MG tablet TAKE 1 TABLET DAILY 90 tablet 3  . atorvastatin (LIPITOR) 10 MG tablet TAKE 1 TABLET DAILY 30 tablet 0  . benazepril (LOTENSIN) 40 MG tablet TAKE 1 TABLET DAILY 90 tablet 3  . dabigatran (PRADAXA) 150 MG CAPS capsule TAKE 1 CAPSULE EVERY 12 HOURS 60 capsule 6  . glucose blood (ONE TOUCH ULTRA TEST) test strip DX:250.00, LABS OVERDUE, Check blood sugar daily as directed (Patient taking differently: 1 each by Other route as needed. DX:250.00, LABS OVERDUE, Check blood sugar daily as directed) 100 each 0  . metoprolol tartrate (LOPRESSOR) 25 MG tablet TAKE 2 TABLETS TWICE A DAY 120 tablet 0  . Multiple Vitamin (MULTIVITAMIN) tablet Take 1 tablet by mouth daily.      [name] DELICA LANCETS 28N MISC 1 each by Other route daily. DX: 250.00, LABS OVERDUE, Check blood sugar daily as directed (Patient taking differently: 1 each by Other route as needed. DX: 250.00, LABS OVERDUE, Check blood sugar daily as directed) 100 each 0  . PARoxetine (PAXIL) 20 MG tablet TAKE ONE-HALF TABLET DAILY. 45 tablet 3   No current facility-administered medications on file prior to visit.    No Known Allergies  Past Medical History  Diagnosis Date  . Anxiety   . Claustrophobia    Occasionally when flying   . Hypertension   . Hyperlipidemia   . OSA (obstructive sleep apnea)     CPAP machine   . Atrial fibrillation   . Colitis   . Hemorrhoids   . Diabetes mellitus, type 2   . PVC's (premature ventricular contractions)   . LV dysfunction     EF 40-45%  . CHF (congestive heart failure)   . A-fib     Past Surgical History  Procedure Laterality Date  . US echocardiography  11/15/2009    EF 40-45%  . Cardiovascular stress test  03/02/2010    EF 50%    History  Smoking status  . Former Smoker -- 1.00 packs/day for 16 years  . Types: Cigarettes  . Quit date: 06/30/1977  Smokeless tobacco  . Never Used    History  Alcohol Use No    Family History  Problem Relation Age of Onset  . Hypertension Father   . Heart attack Father     Age 51 (MI)  . Heart failure Father   . Colonic polyp Sister     and Father  . Colon cancer Neg Hx   . Stomach cancer Neg Hx   . Stroke Mother     Review of Systems: As noted in history of present illness  All other systems were reviewed and are negative.  Physical Exam: BP 146/82 mmHg  Pulse 82  Ht 5\' 8"  (1.727  m)  Wt 233 lb 12.8 oz (106.051 kg)  BMI 35.56 kg/m2 The patient is alert and oriented x 3.   The skin is warm and dry.    The HEENT exam  is normal.  The carotids are 2+ without bruits.  There is no thyromegaly.  There is no JVD.  The lungs are clear.   The heart exam reveals  an irregular  rate with a normal S1 and S2.  There are no murmurs, gallops, or rubs.  The PMI is not displaced.   Abdominal exam reveals good bowel sounds. Exam of the legs reveals  2+ pretibial  Edema more pronounced on the left.   The distal pulses are intact.  Cranial nerves II - XII are intact.  Motor and sensory functions are intact.  The gait is normal.  LABORATORY DATA: Lab Results  Component Value Date   WBC 11.6* 12/17/2013   HGB 14.2 12/17/2013   HCT 42.1 12/17/2013   PLT 290 12/17/2013   GLUCOSE 115* 12/23/2012   CHOL 161  12/23/2012   TRIG 127.0 12/23/2012   HDL 46.80 12/23/2012   LDLCALC 89 12/23/2012   ALT 26 12/23/2012   AST 25 12/23/2012   NA 139 12/23/2012   K 3.9 12/23/2012   CL 105 12/23/2012   CREATININE 1.0 12/23/2012   BUN 13 12/23/2012   CO2 26 12/23/2012   TSH 1.30 06/13/2013   PSA 0.63 09/12/2007   HGBA1C 6.2 06/13/2013   MICROALBUR 1.8 06/13/2013    Assessment / Plan: 1. Permanent atrial fibrillation. Rate is well controlled. Lanoxin stopped on his last visit. Continue metoprolol.  He is on chronic anticoagulation with Pradaxa. Renal function is normal.  2. Congestive heart failure with chronic systolic dysfunction. Ejection fraction is moderately reduced. Continue ACE inhibitor, beta blocker, and diuretic therapy. His weight is up and he has more edema. Will increase lasix to 40 mg daily. Need to pay more attention to sodium restriction.Encourage increased aerobic activity. Follow up in 6 months.  3. Hypertension, well controlled.

## 2014-02-02 ENCOUNTER — Ambulatory Visit (INDEPENDENT_AMBULATORY_CARE_PROVIDER_SITE_OTHER): Payer: Medicare Other | Admitting: Nurse Practitioner

## 2014-02-02 ENCOUNTER — Encounter: Payer: Self-pay | Admitting: Nurse Practitioner

## 2014-02-02 VITALS — BP 132/90 | HR 80 | Temp 97.7°F | Ht 68.5 in | Wt 233.0 lb

## 2014-02-02 DIAGNOSIS — A46 Erysipelas: Secondary | ICD-10-CM | POA: Diagnosis not present

## 2014-02-02 DIAGNOSIS — J069 Acute upper respiratory infection, unspecified: Secondary | ICD-10-CM

## 2014-02-02 MED ORDER — CEPHALEXIN 500 MG PO CAPS: 500.0000 mg | ORAL_CAPSULE | Freq: Two times a day (BID) | ORAL | Status: DC

## 2014-02-02 NOTE — Progress Notes (Signed)
Pre visit review using our clinic review tool, if applicable. No additional management support is needed unless otherwise documented below in the visit note. 

## 2014-02-02 NOTE — Progress Notes (Signed)
Subjective:     Samuel Moyer is a 74 y.o. male c/o cough & swelling of L leg.  Cough: 4 days. Associated symptoms: mild nasal congestion & sore throat. Feeling better today. No fever, chest pain, or SOB.  Swollen leg: swelling since November when he sprained ankle. He was seen by UC. Pain resolved, swelling persistent. He has noticed new color change in skin-red & peeling for several days. He was seen by cardiology last week-lasix was doubled to 40 mg qd. Dr Martinique commented on L leg swelling in notes. Pt had bilat LE edema w/L>r as he does today. Pt states edema has not improved in LE.  The following portions of the patient's history were reviewed and updated as appropriate: allergies, current medications, past medical history, past social history, past surgical history and problem list.  Review of Systems Constitutional: negative for fatigue and fevers Ears, nose, mouth, throat, and face: negative for earaches Cardiovascular: negative for exertional chest pressure/discomfort and irregular heart beat    Objective:    BP 132/90 mmHg  Pulse 80  Temp(Src) 97.7 F (36.5 C) (Oral)  Ht 5' 8.5" (1.74 m)  Wt 233 lb (105.688 kg)  BMI 34.91 kg/m2  SpO2 96% BP 132/90 mmHg  Pulse 80  Temp(Src) 97.7 F (36.5 C) (Oral)  Ht 5' 8.5" (1.74 m)  Wt 233 lb (105.688 kg)  BMI 34.91 kg/m2  SpO2 96% General appearance: alert, cooperative, appears stated age and no distress Head: Normocephalic, without obvious abnormality, atraumatic Eyes: negative findings: lids and lashes normal and conjunctivae and sclerae normal Ears: normal TM's and external ear canals both ears Throat: lips, mucosa, and tongue normal; teeth and gums normal Lungs: clear to auscultation bilaterally Heart: rate nml, some irregularity of rhythm-pt has a-fib Extremities: edema pretibial R LE edema from foot to mid-shin. +2 pitting edema L LE from foot to knee. erythematous discoloration & blister-like appearance of pretibial area.  Erythema suddenly stops about 2 inches below knee. FROM in ankle, NT. and . Lymph nodes: Cervical adenopathy: none    Assessment:Plan    1. Erysipelas of lower extremity DD: cellulitis, venous stasis complicated by recent swelling secondary to sprain of ankle. - cephALEXin (KEFLEX) 500 MG capsule; Take 1 capsule (500 mg total) by mouth every 12 (twelve) hours.  Dispense: 14 capsule; Refill: 0 See pt instructions for complete plan  2. URI Daily sinus rinses  F/u 10 days to assess leg

## 2014-02-02 NOTE — Patient Instructions (Addendum)
For leg:  Start antibiotic for lower leg.  Eat yogurt daily to help prevent antibiotic -associated diarrhea Also, walk twice daily for 15 minutes at a pace that allows you to speak in 4 word sentences. Ankle exercises as demonstrated every hour. Elevate Left leg when sitting. Warm tub soaks once daily.  For cough: start daily sinus rinse-use Neilmed sinus rinse. You can find it in cold & allergy isle of pharmacy. Use for about 1 week.  Follow up with Dr Linna Darner in 2 weeks to evaluate leg. You are also due for labs.  Nice to meet you!

## 2014-02-16 ENCOUNTER — Encounter: Payer: Self-pay | Admitting: Internal Medicine

## 2014-02-16 ENCOUNTER — Ambulatory Visit (INDEPENDENT_AMBULATORY_CARE_PROVIDER_SITE_OTHER): Payer: Medicare Other | Admitting: Internal Medicine

## 2014-02-16 VITALS — BP 138/88 | HR 72 | Temp 97.7°F | Ht 69.0 in | Wt 231.5 lb

## 2014-02-16 DIAGNOSIS — I4819 Other persistent atrial fibrillation: Secondary | ICD-10-CM

## 2014-02-16 DIAGNOSIS — J310 Chronic rhinitis: Secondary | ICD-10-CM | POA: Diagnosis not present

## 2014-02-16 DIAGNOSIS — E7800 Pure hypercholesterolemia, unspecified: Secondary | ICD-10-CM

## 2014-02-16 DIAGNOSIS — R739 Hyperglycemia, unspecified: Secondary | ICD-10-CM | POA: Diagnosis not present

## 2014-02-16 DIAGNOSIS — I481 Persistent atrial fibrillation: Secondary | ICD-10-CM

## 2014-02-16 DIAGNOSIS — I872 Venous insufficiency (chronic) (peripheral): Secondary | ICD-10-CM | POA: Diagnosis not present

## 2014-02-16 DIAGNOSIS — I10 Essential (primary) hypertension: Secondary | ICD-10-CM

## 2014-02-16 NOTE — Patient Instructions (Signed)
Your next office appointment will be determined based upon review of your pending labs . Those instructions will be transmitted to you through My Chart   Followup as needed for your acute issue. Please report any significant change in your symptoms.  Wear over the calf support hose if you are standing for prolonged periods of  time or working on your feet for prolonged periods of time.  Use  Aveeno Daily  Moisturizing Lotion  twice a day  for the dry skin. Bathe with moisturizing liquid soap , not bar soap.  Plain Mucinex (NOT D) for thick secretions ;force NON dairy fluids .   Nasal cleansing in the shower as discussed with lather of mild shampoo.After 10 seconds wash off lather while  exhaling through nostrils. Make sure that all residual soap is removed to prevent irritation.  Flonase OR Nasacort AQ 1 spray in each nostril twice a day as needed. Use the "crossover" technique into opposite nostril spraying toward opposite ear @ 45 degree angle, not straight up into nostril.  Plain Allegra (NOT D )  160 daily , Loratidine 10 mg , OR Zyrtec 10 mg @ bedtime  as needed for itchy eyes & sneezing.

## 2014-02-16 NOTE — Progress Notes (Signed)
   Subjective:    Patient ID: Samuel Moyer, male    DOB: April 01, 1940, 74 y.o.   MRN: [id number]  HPI He is here for follow-up of several issues  He had had a recent "cold" which is essentially resolved. He has no residual upper respiratory tract infection symptoms.   He's had some swelling of the left calf and ankle. Initially he had swelling bilaterally but this responded to diuretic. There has been residual swelling on the left  This in the context of a severe sprain of the left ankle 12/05/13. He was off his feet approximately 3 weeks. When he began ambulating he had to use a cane  With increase in the diuretic from 20 to 40 mg there had been improvement in the edema, particularly on the right  He now has residual tightness and swelling of the left lower leg /ankle with minimal redness  He also is requesting follow-up labs. The chart was reviewed to verify which data were current   Review of Systems Frontal headache, facial pain , nasal purulence, dental pain, sore throat , otic pain or otic discharge denied. No fever , chills or sweats.  He does have minor cough productive of some clear material.  He denies chest pain, dyspnea, hemoptysis. He remains in chronic atrial fib.    Objective:   Physical Exam  Pertinent or positive findings include: He is markedly hearing impaired. He is not wearing his hearing aids. The nasal septum is deviated to the right He exhibits slow atrial fibrillation clinically Abdomen is protuberant He has trace-1/2+ edema to left ankle. There is evidence of stasis dermatitis over the shins greater on the left. There is also evidence of exfoliation on that side. Clinically there is no evidence of cellulitis. Homans sign is negative  General appearance :adequately nourished; in no distress. Eyes: No conjunctival inflammation or scleral icterus is present. Oral exam: Dental hygiene is good. Lips and gums are healthy appearing.There is no oropharyngeal  erythema or exudate noted.  Lungs:Chest clear to auscultation; no wheezes, rhonchi,rales ,or rubs present.No increased work of breathing.  Abdomen: bowel sounds normal, soft and non-tender without masses, organomegaly or hernias noted.  No guarding or rebound.  Vascular : all pulses equal ; no bruits present. Skin:Warm & dry. No jaundice or tenting Lymphatic: No lymphadenopathy is noted about the head, neck, axilla           Assessment & Plan:  #1 venous insufficiency related to severe ankles strain November 2015   #2 venous insufficiency with residual edema  #3 stasis dermatitis  #4 residual rhinitis without evidence of upper respiratory tract infection  #5 lab monitor follow-up indicated for renal function, hepatic function, lipids, and A1c  See orders

## 2014-02-16 NOTE — Progress Notes (Signed)
Pre visit review using our clinic review tool, if applicable. No additional management support is needed unless otherwise documented below in the visit note. 

## 2014-02-17 ENCOUNTER — Telehealth: Payer: Self-pay | Admitting: Internal Medicine

## 2014-02-17 NOTE — Telephone Encounter (Signed)
emmi emailed °

## 2014-02-20 ENCOUNTER — Other Ambulatory Visit: Payer: Self-pay | Admitting: Internal Medicine

## 2014-02-27 ENCOUNTER — Telehealth: Payer: Self-pay | Admitting: Internal Medicine

## 2014-02-27 NOTE — Telephone Encounter (Signed)
Pt request blood work, pt did get to do the lab work on 02/16/14. Please advise.

## 2014-03-02 ENCOUNTER — Telehealth: Payer: Self-pay | Admitting: Internal Medicine

## 2014-03-02 ENCOUNTER — Encounter: Payer: Self-pay | Admitting: Internal Medicine

## 2014-03-02 DIAGNOSIS — R700 Elevated erythrocyte sedimentation rate: Secondary | ICD-10-CM | POA: Insufficient documentation

## 2014-03-02 NOTE — Telephone Encounter (Signed)
Samuel Moyer can you cal pt and tell him what he is needing to do about the labs.  He is confused

## 2014-03-02 NOTE — Telephone Encounter (Signed)
Please verify labs being processed from 1/18; I don't results but order had been entered

## 2014-03-02 NOTE — Telephone Encounter (Signed)
Left message for patient to return call.

## 2014-03-03 ENCOUNTER — Other Ambulatory Visit (INDEPENDENT_AMBULATORY_CARE_PROVIDER_SITE_OTHER): Payer: Medicare Other

## 2014-03-03 ENCOUNTER — Other Ambulatory Visit: Payer: Self-pay | Admitting: Internal Medicine

## 2014-03-03 DIAGNOSIS — I10 Essential (primary) hypertension: Secondary | ICD-10-CM | POA: Diagnosis not present

## 2014-03-03 DIAGNOSIS — E7800 Pure hypercholesterolemia, unspecified: Secondary | ICD-10-CM

## 2014-03-03 DIAGNOSIS — R739 Hyperglycemia, unspecified: Secondary | ICD-10-CM | POA: Diagnosis not present

## 2014-03-03 LAB — LIPID PANEL
CHOL/HDL RATIO: 3
CHOLESTEROL: 150 mg/dL (ref 0–200)
HDL: 53.2 mg/dL (ref >39.00)
LDL CALC: 78 mg/dL (ref 0–99)
NONHDL: 96.8
TRIGLYCERIDES: 93 mg/dL (ref 0.0–149.0)
VLDL: 18.6 mg/dL (ref 0.0–40.0)

## 2014-03-03 LAB — HEMOGLOBIN A1C: HEMOGLOBIN A1C: 6.2 % (ref 4.6–6.5)

## 2014-03-03 LAB — HEPATIC FUNCTION PANEL
ALBUMIN: 4.1 g/dL (ref 3.5–5.2)
ALK PHOS: 65 U/L (ref 39–117)
ALT: 15 U/L (ref 0–53)
AST: 18 U/L (ref 0–37)
Bilirubin, Direct: 0.1 mg/dL (ref 0.0–0.3)
Total Bilirubin: 0.5 mg/dL (ref 0.2–1.2)
Total Protein: 6.8 g/dL (ref 6.0–8.3)

## 2014-03-03 LAB — BASIC METABOLIC PANEL
BUN: 17 mg/dL (ref 6–23)
CHLORIDE: 106 meq/L (ref 96–112)
CO2: 29 meq/L (ref 19–32)
CREATININE: 1.14 mg/dL (ref 0.40–1.50)
Calcium: 9.4 mg/dL (ref 8.4–10.5)
GFR: 66.65 mL/min (ref >60.00)
GLUCOSE: 118 mg/dL — AB (ref 70–99)
Potassium: 4.4 mEq/L (ref 3.5–5.1)
SODIUM: 140 meq/L (ref 135–145)

## 2014-03-03 NOTE — Telephone Encounter (Signed)
Patient did not have his labs drawn. I let him know lab orders are placed so he just needs to return to have them drawn. He states he will do so this morning.

## 2014-04-03 ENCOUNTER — Encounter: Payer: Self-pay | Admitting: Adult Health

## 2014-04-03 ENCOUNTER — Ambulatory Visit (INDEPENDENT_AMBULATORY_CARE_PROVIDER_SITE_OTHER): Payer: Medicare Other | Admitting: Adult Health

## 2014-04-03 VITALS — BP 132/84 | HR 71 | Temp 97.8°F | Ht 68.0 in | Wt 237.0 lb

## 2014-04-03 DIAGNOSIS — G4733 Obstructive sleep apnea (adult) (pediatric): Secondary | ICD-10-CM | POA: Diagnosis not present

## 2014-04-03 NOTE — Patient Instructions (Addendum)
Continue on CPAP At bedtime   Change mask .  Download in 1 month  Follow up in 1 year with Dr. Elsworth Soho  And As needed

## 2014-04-03 NOTE — Progress Notes (Signed)
   Subjective:    Patient ID: Samuel Moyer, male    DOB: October 11, 1940, 73 y.o.   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  PSG 04/06/10 (wt 200) showed severe obstructive sleep apnea with AHI 66/h, nadir desatn 75% corrrected by CPAP 10 cm to AHI 4.5/h. Higher pressures were associated with emergence of central events.  Not needed advair or xopenex MDI since hosp dc  Download 4/30 -07/18/10 >>excellent usage 7h, residual AI 11/h  03/2011 Download shows good compliance, residual ahi 11/h  Download 03/2012 >> residual AHI 15/h on cpap 10 cm, good usage  Download 03/2013 - AHI 21/h on 10 cm,EPR 3,  good usage He denies daytime somnolence, mask ok, pressure ok, no snoring  On benazepril - no cough or throat clearing  04/03/2014 Follow Up OSA  Patient returns for sleep apnea follow-up. Wearing CPAP every night x7hrs.  denies any mask fit, pressure or leak issues.  no new complaints Download over the last year shows excellent compliance. Wearing 7.5 hours each night.  AHI 20 over last year. Higher for last month. Says he is due for new mask.  Says he feels rested with no excessive daytime sleepiness Encouraged on weight loss   Review of Systems neg for any significant sore throat, dysphagia, itching, sneezing, nasal congestion or excess/ purulent secretions, fever, chills, sweats, unintended wt loss, pleuritic or exertional cp, hempoptysis, orthopnea pnd or change in chronic leg swelling. Also denies presyncope, palpitations, heartburn, abdominal pain, nausea, vomiting, diarrhea or change in bowel or urinary habits, dysuria,hematuria, rash, arthralgias, visual complaints, headache, numbness weakness or ataxia.     Objective:   Physical Exam  Gen. Pleasant, well-nourished, in no distress ENT - no lesions, no post nasal  drip Neck: No JVD, no thyromegaly, no carotid bruits Lungs: no use of accessory muscles, no dullness to percussion, clear without rales or rhonchi  Cardiovascular: Rhythm regular, heart sounds  normal, no murmurs or gallops, no peripheral edema Musculoskeletal: No deformities, no cyanosis or clubbing         Assessment & Plan:

## 2014-04-03 NOTE — Assessment & Plan Note (Signed)
Great compliance on CPAP  AHI  Remains similar around 20 . Despite higher pressure He feels okay.  Will have him change mask and see if improved over next month.  follow up Dr. Elsworth Soho  In 1 year.

## 2014-04-06 ENCOUNTER — Ambulatory Visit (INDEPENDENT_AMBULATORY_CARE_PROVIDER_SITE_OTHER): Payer: Medicare Other | Admitting: Internal Medicine

## 2014-04-06 ENCOUNTER — Other Ambulatory Visit: Payer: Medicare Other

## 2014-04-06 ENCOUNTER — Encounter: Payer: Self-pay | Admitting: Internal Medicine

## 2014-04-06 VITALS — BP 144/80 | HR 65 | Temp 97.7°F | Ht 68.0 in | Wt 235.2 lb

## 2014-04-06 DIAGNOSIS — R609 Edema, unspecified: Secondary | ICD-10-CM | POA: Diagnosis not present

## 2014-04-06 DIAGNOSIS — S96912D Strain of unspecified muscle and tendon at ankle and foot level, left foot, subsequent encounter: Secondary | ICD-10-CM

## 2014-04-06 NOTE — Progress Notes (Signed)
   Subjective:    Patient ID: Samuel Moyer, male    DOB: May 11, 1940, 74 y.o.   MRN: [id number]  HPI Since November 6,2015 ( [facility] from [facility] reviewed)  he's had 2 courses of antibiotics for possible cellulitic changes in the left lower extremity following a high ankle strain sustained in a mechanical fall. He was off the leg 3 weeks & employed a boot & crutches.He continues to have bilateral pitting edema.  He denies any active cardiopulmonary symptoms. He also has no significant constitutional symptoms.  His furosemide has been increased to 40 mg daily.  He states he is compliant with his CPAP for OSA.     Review of Systems No fever, chills, or sweats are present. He has no vesicular or pustular lesions over the left lower extremity.  Chest pain, palpitations, tachycardia, exertional dyspnea, paroxysmal nocturnal dyspnea, claudication or edema are absent.   Denied were any change in heart rhythm or rate prior to the initial  event. There was no associated chest pain or shortness of breath .  Also specifically denied prior to the episode were headache, limb weakness, tingling, or numbness. No seizure activity noted.      Objective:   Physical Exam  Positive or pertinent findings include: He has bilateral hearing aids.  Heart sounds are somewhat distant.  Breath sounds are decreased.  He has a large ventral hernia.  Posterior tibial pulses are decreased; dorsalis pedis pulses are strong.  20.5 cm below the superior patellar margin the left calf is 39 cm; the the right calf is 37.  He has brawny hyperpigmentation over the left shin without active cellulitis.  He has 1+ edema to mid shin on the left and 1+ edema to right ankle.  Pertinent or positive findings include: General appearance :adequately nourished; in no distress.BMI 35.78 Eyes: No conjunctival inflammation or scleral icterus is present. Oral exam:  Lips and gums are healthy appearing.There is no oropharyngeal  erythema or exudate noted. Dental hygiene is good. Heart:  Normal rate and regular rhythm. S1 and S2 normal without gallop, murmur, click, rub or other extra sounds   Lungs:Chest clear to auscultation; no wheezes, rhonchi,rales ,or rubs present.No increased work of breathing.  Abdomen: bowel sounds normal,  non-tender without masses, or organomegaly  noted.  No guarding or rebound. No flank tenderness to percussion. Vascular : all pulses equal ; no bruits present. Skin:Warm & dry ; no tenting or jaundice  Lymphatic: No lymphadenopathy is noted about the head, neck, axilla areas.  Neuro: Strength, tone  normal.        Assessment & Plan:  #1 asymmetric edema following high ankle strain left lower extremity.  #2 amlodipine therapy  Plan: Amlodipine will be discontinued.  He'll be instructed asked to monitor his blood pressure closely.  D-dimer will be performed. If this is elevated; venous Doppler will be ordered. Ortho referral if no better

## 2014-04-06 NOTE — Patient Instructions (Addendum)
Minimal Blood Pressure Goal= AVERAGE < 140/90;  Ideal is an AVERAGE < 135/85. This AVERAGE should be calculated from @ least 5-7 BP readings taken @ different times of day on different days of week OFF Amlodipine . You should not respond to isolated BP readings , but rather the AVERAGE for that week .Please bring your  blood pressure cuff to office visits to verify that it is reliable.It  can also be checked against the blood pressure device at the pharmacy. Finger or wrist cuffs are not dependable; an arm cuff is.   Your next office appointment will be determined based upon review of your pending labs  Those instructions will be transmitted to you through My Chart  Critical values will be called.   Please report any significant change in your symptoms.

## 2014-04-06 NOTE — Progress Notes (Signed)
Pre visit review using our clinic review tool, if applicable. No additional management support is needed unless otherwise documented below in the visit note. 

## 2014-04-07 DIAGNOSIS — R609 Edema, unspecified: Secondary | ICD-10-CM | POA: Insufficient documentation

## 2014-04-07 LAB — D-DIMER, QUANTITATIVE: D-Dimer, Quant: 0.45 ug/mL-FEU (ref 0.00–0.48)

## 2014-04-08 NOTE — Progress Notes (Signed)
Reviewed & agree with plan  

## 2014-04-13 ENCOUNTER — Telehealth: Payer: Self-pay | Admitting: Pulmonary Disease

## 2014-04-13 ENCOUNTER — Encounter: Payer: Self-pay | Admitting: Internal Medicine

## 2014-04-13 ENCOUNTER — Ambulatory Visit (INDEPENDENT_AMBULATORY_CARE_PROVIDER_SITE_OTHER): Payer: Medicare Other | Admitting: Internal Medicine

## 2014-04-13 VITALS — BP 160/100 | HR 75 | Temp 97.8°F | Resp 15 | Ht 68.0 in | Wt 238.2 lb

## 2014-04-13 DIAGNOSIS — I10 Essential (primary) hypertension: Secondary | ICD-10-CM

## 2014-04-13 MED ORDER — HYDROCHLOROTHIAZIDE 12.5 MG PO CAPS: 12.5000 mg | ORAL_CAPSULE | Freq: Every day | ORAL | Status: DC

## 2014-04-13 NOTE — Progress Notes (Signed)
   Subjective:    Patient ID: Samuel Moyer, male    DOB: September 30, 1940, 74 y.o.   MRN: [id number]  HPI He did have one episode of paroxysmal nocturnal dyspnea for 30 minutes; this resolved w/o treatment or interventions. He does not sense his atrial fib as palpitations or tachyarrhythmias. He feels that this does cause some error readings on his automated blood pressure cuff. He dose not restrict sodium per se.   Review of Systems   Chest pain, palpitations, tachycardia, exertional dyspnea, or claudication  absent.       Objective:   Physical Exam Pertinent or positive findings include: Heart rhythm is slow and irregular.  He has 1/2+ edema of the left lower extremity.  There is some hyperpigmentation over the left shin without cellulitis.  Appears  well-nourished & in no acute distress.BMI 36.23. No carotid bruits are present.No neck vein distention present at 10 - 15 degrees. Thyroid normal to palpation No gallop or murmur Chest is clear with no increased work of breathing There is no evidence of aortic aneurysm or renal artery bruits Abdomen protuberant but soft with no organomegaly or masses. Dullness to percussion BUQ.No HJR No clubbing, cyanosis present. Pedal pulses are intact  No ischemic skin changes are present . Fingernails healthy  Alert and oriented. Strength, tone, DTRs reflexes normal        Assessment & Plan:  See Current Assessment & Plan in Problem List under specific Diagnosis

## 2014-04-13 NOTE — Telephone Encounter (Signed)
Download on 12 cm 03/2014 >> AHI 33/h Good usage Suggest rpt CPAP titration since quite a few events persist on cpap

## 2014-04-13 NOTE — Progress Notes (Signed)
Pre visit review using our clinic review tool, if applicable. No additional management support is needed unless otherwise documented below in the visit note. 

## 2014-04-13 NOTE — Patient Instructions (Signed)
Your ideal BP goal = AVERAGE < 135/85.Minimal goal is average < 140/90. Avoid ingestion of  excess salt/sodium.Cook with pepper & other spices . Use the salt substitute "No Salt" OR the Mrs Dash products to season food @ the table. Avoid foods which taste salty or "vinegary" as their sodium content will be high. 

## 2014-04-13 NOTE — Assessment & Plan Note (Signed)
Add low-dose HCTZ to enhance blood pressure response. Recheck chemistries after 4-6 weeks.

## 2014-04-13 NOTE — Telephone Encounter (Signed)
Patient says he saw TP and they decided to have him change his mask to see if he is having leakage, then reassess after 30 days.  Patient wants to know if he needs an appointment with Dr. Elsworth Soho to re-assess?  RA - please advise. Thanks

## 2014-04-20 NOTE — Telephone Encounter (Signed)
He has residaul events Suggest rpt CPAP titration , then appt

## 2014-04-23 ENCOUNTER — Telehealth: Payer: Self-pay | Admitting: Pulmonary Disease

## 2014-04-23 NOTE — Telephone Encounter (Signed)
Called made pt aware. He reports he wants to wait 30 days and then make an appt. He reports he is going to get his CPAP 05/04/14 and wants to wait for [place] to receive a report from his CPAP before appt is made. Will forward to RA as an [place]

## 2014-04-23 NOTE — Telephone Encounter (Signed)
LMTCb x 1

## 2014-04-23 NOTE — Telephone Encounter (Signed)
Pt returned phone call - [phone]

## 2014-04-23 NOTE — Telephone Encounter (Signed)
See previous phone message 3/14

## 2014-04-23 NOTE — Telephone Encounter (Signed)
lmtcb

## 2014-04-24 ENCOUNTER — Ambulatory Visit: Payer: Medicare Other | Admitting: Adult Health

## 2014-04-24 NOTE — Telephone Encounter (Signed)
Ok to schedule appt in may

## 2014-04-27 NOTE — Telephone Encounter (Signed)
Patient scheduled.  Nothing further needed.

## 2014-04-29 ENCOUNTER — Other Ambulatory Visit: Payer: Self-pay | Admitting: Internal Medicine

## 2014-04-29 ENCOUNTER — Encounter: Payer: Self-pay | Admitting: Adult Health

## 2014-04-29 NOTE — Telephone Encounter (Signed)
OK X 3 mos 

## 2014-05-03 ENCOUNTER — Other Ambulatory Visit: Payer: Self-pay | Admitting: Internal Medicine

## 2014-06-10 ENCOUNTER — Other Ambulatory Visit: Payer: Self-pay | Admitting: Pulmonary Disease

## 2014-06-10 DIAGNOSIS — G4733 Obstructive sleep apnea (adult) (pediatric): Secondary | ICD-10-CM

## 2014-06-19 ENCOUNTER — Telehealth: Payer: Self-pay | Admitting: Pulmonary Disease

## 2014-06-19 DIAGNOSIS — G4733 Obstructive sleep apnea (adult) (pediatric): Secondary | ICD-10-CM

## 2014-06-19 NOTE — Telephone Encounter (Signed)
lmtcb for pt.  

## 2014-06-19 NOTE — Telephone Encounter (Signed)
Spoke with pt, aware of results and recs.  [facility] to [facility] to [facility] pressure.  Verified appt on Tuesday.  Nothing further needed.

## 2014-06-19 NOTE — Telephone Encounter (Signed)
Pt returned call - [phone]

## 2014-06-19 NOTE — Telephone Encounter (Signed)
Download on 12 cm shows residuals AHI 28/h Needs higher pressure - pl order CPAP titration study  Increase pr to 14 cm

## 2014-06-23 ENCOUNTER — Encounter: Payer: Self-pay | Admitting: Pulmonary Disease

## 2014-06-23 ENCOUNTER — Ambulatory Visit (INDEPENDENT_AMBULATORY_CARE_PROVIDER_SITE_OTHER): Payer: Medicare Other | Admitting: Pulmonary Disease

## 2014-06-23 ENCOUNTER — Other Ambulatory Visit (INDEPENDENT_AMBULATORY_CARE_PROVIDER_SITE_OTHER): Payer: Medicare Other

## 2014-06-23 VITALS — BP 128/86 | HR 77 | Ht 68.0 in | Wt 234.0 lb

## 2014-06-23 DIAGNOSIS — I10 Essential (primary) hypertension: Secondary | ICD-10-CM | POA: Diagnosis not present

## 2014-06-23 DIAGNOSIS — G4733 Obstructive sleep apnea (adult) (pediatric): Secondary | ICD-10-CM | POA: Diagnosis not present

## 2014-06-23 LAB — BASIC METABOLIC PANEL
BUN: 17 mg/dL (ref 6–23)
CALCIUM: 9.7 mg/dL (ref 8.4–10.5)
CO2: 33 mEq/L — ABNORMAL HIGH (ref 19–32)
Chloride: 102 mEq/L (ref 96–112)
Creatinine, Ser: 1.06 mg/dL (ref 0.40–1.50)
GFR: 72.42 mL/min (ref >60.00)
GLUCOSE: 123 mg/dL — AB (ref 70–99)
Potassium: 4.5 mEq/L (ref 3.5–5.1)
Sodium: 140 mEq/L (ref 135–145)

## 2014-06-23 NOTE — Assessment & Plan Note (Addendum)
He has persistent residual events in spite of increasing pressure to 12 cm -We will check download on CPAP 14 cm - Increased pr requirement may be due to wt gain of 30 lbs since study  If persistent events, then will plan for a repeat titration study in the sleep center  Weight loss encouraged, compliance with goal of at least 4-6 hrs every night is the expectation. Advised against medications with sedative side effects Cautioned against driving when sleepy - understanding that sleepiness will vary on a day to day basis

## 2014-06-23 NOTE — Patient Instructions (Signed)
We will check download on CPAP 14 cm If persistent events, then will plan for a repeat titration study in the sleep center

## 2014-06-23 NOTE — Progress Notes (Signed)
   Subjective:    Patient ID: Leanard Dimaio, male    DOB: [birth year], 74 y.o.   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  PSG 04/06/10 (wt 200) showed severe obstructive sleep apnea with AHI 66/h, nadir desatn 75% corrrected by CPAP 10 cm to AHI 4.5/h. Higher pressures were associated with emergence of central events.   Download 4/30 -07/18/10 >>excellent usage 7h, residual AI 11/h  03/2011 Download shows good compliance, residual ahi 11/h  Download 03/2012 >> residual AHI 15/h on cpap 10 cm, good usage  Download 03/2013 - AHI 21/h on 10 cm,EPR 3, good usage   06/23/2014  Chief Complaint  Patient presents with  . Follow-up    OSA; sleeps 7 or more hours nightly; feels rested during the day; settings changed yesterday;   He denies daytime somnolence, mask ok, pressure ok, no snoring  On benazepril - no cough or throat clearing  Pressure was just increased to 14 cm last night, tolerated well Does not have increased tiredness, wife has not noted pauses or leak  Wearing CPAP every night x7hrs. denies any mask fit, pressure or leak issues. no new complaints Download over the last year shows excellent compliance. Wearing 7.5 hours each night.  AHI 20 over last year.   Download on 12 cm 03/2014 >> AHI 33/h, Good usage Download on 12 cm shows residuals AHI 28/h, Increase pr to 14 cm  Review of Systems neg for any significant sore throat, dysphagia, itching, sneezing, nasal congestion or excess/ purulent secretions, fever, chills, sweats, unintended wt loss, pleuritic or exertional cp, hempoptysis, orthopnea pnd or change in chronic leg swelling. Also denies presyncope, palpitations, heartburn, abdominal pain, nausea, vomiting, diarrhea or change in bowel or urinary habits, dysuria,hematuria, rash,  arthralgias, visual complaints, headache, numbness weakness or ataxia.     Objective:   Physical Exam  Gen. Pleasant, well-nourished, in no distress ENT - no lesions, no post nasal drip Neck: No JVD, no thyromegaly, no carotid bruits Lungs: no use of accessory muscles, no dullness to percussion, clear without rales or rhonchi  Cardiovascular: Rhythm regular, heart sounds  normal, no murmurs or gallops, no peripheral edema Musculoskeletal: No deformities, no cyanosis or clubbing         Assessment & Plan:

## 2014-06-25 ENCOUNTER — Ambulatory Visit (INDEPENDENT_AMBULATORY_CARE_PROVIDER_SITE_OTHER): Payer: Medicare Other | Admitting: Internal Medicine

## 2014-06-25 ENCOUNTER — Encounter: Payer: Self-pay | Admitting: Internal Medicine

## 2014-06-25 VITALS — BP 140/82 | HR 75 | Temp 98.3°F | Ht 68.0 in | Wt 234.8 lb

## 2014-06-25 DIAGNOSIS — G4733 Obstructive sleep apnea (adult) (pediatric): Secondary | ICD-10-CM

## 2014-06-25 DIAGNOSIS — R609 Edema, unspecified: Secondary | ICD-10-CM | POA: Diagnosis not present

## 2014-06-25 DIAGNOSIS — I10 Essential (primary) hypertension: Secondary | ICD-10-CM | POA: Diagnosis not present

## 2014-06-25 MED ORDER — HYDROCHLOROTHIAZIDE 12.5 MG PO CAPS: 12.5000 mg | ORAL_CAPSULE | Freq: Every day | ORAL | Status: DC

## 2014-06-25 NOTE — Assessment & Plan Note (Signed)
Cardiac dysrhythmia risk if CPAP nonemployed was discussed

## 2014-06-25 NOTE — Progress Notes (Signed)
   Subjective:    Patient ID: Samuel Moyer, male    DOB: [birth year], 74 y.o.   MRN: [id number]  HPI The swelling originally began in left lower extremity after severe high ankle strain. The swelling has improved with the present regimen. His amlodipine was discontinued and he was placed on  HCTZ 12.5 mg..  He has been restricting sodium and does not add salt @ the table. He has been wearing compression hose when he is on his feet for prolonged periods of time. He works at the [employer] & is on his feet up to 5 hours at a time  Blood pressure has been variable; he has no average.   He does have sleep apnea. The CPAP pressure has been adjusted and is now 14 PSI.  Other than the edema he has no active cardio pulmonary symptoms.  His chemistries and electrolytes were normal. Specifically potassium was 4.5; BUN 17; creatinine 1.06; and GFR 72.42.   Review of Systems  Chest pain, palpitations, tachycardia, exertional dyspnea, paroxysmal nocturnal dyspnea,or claudication  are absent.      Objective:   Physical Exam Pertinent or positive findings include: He has bilateral hearing aids.  Heart rhythm is slow and slightly irregular. Abdomen is protuberant ;ventral hernia is present.  The dorsalis pedis pulses are palpable; there is slight decrease in the posterior tibial pulses, especially in the left lower extremity. There is one half plus edema in the left ankle and foot. Stasis hyperpigmentation LLE.  General appearance :adequately nourished; in no distress. Eyes: No conjunctival inflammation or scleral icterus is present. Oral exam:  Lips and gums are healthy appearing.There is no oropharyngeal erythema or exudate noted. Heart:  No gallop, murmur, click, rub or other extra sounds   Lungs:Chest clear to auscultation; no wheezes, rhonchi,rales ,or rubs present.No increased work of breathing.  Abdomen: bowel sounds normal, soft and non-tender without masses, or organomegaly noted.  No  guarding or rebound.  Vascular : all pulses equal ; no bruits present. Skin:Warm & dry.  Intact without suspicious lesions or rashes ; no tenting or jaundice  Lymphatic: No lymphadenopathy is noted about the head, neck, axilla Neuro: Strength, tone  normal.        Assessment & Plan:  See Current Assessment & Plan in Problem List under specific Diagnosis

## 2014-06-25 NOTE — Progress Notes (Signed)
Pre visit review using our clinic review tool, if applicable. No additional management support is needed unless otherwise documented below in the visit note. 

## 2014-06-25 NOTE — Assessment & Plan Note (Signed)
Blood pressure goals discussed

## 2014-06-25 NOTE — Assessment & Plan Note (Signed)
No change in present regimen. He is to continue the CPAP compliance. He is to wear compression hose if he is on his feet for prolonged periods of time.

## 2014-06-25 NOTE — Patient Instructions (Signed)

## 2014-07-09 ENCOUNTER — Encounter: Payer: Self-pay | Admitting: Pulmonary Disease

## 2014-07-17 ENCOUNTER — Encounter: Payer: Self-pay | Admitting: Cardiology

## 2014-07-17 ENCOUNTER — Ambulatory Visit (INDEPENDENT_AMBULATORY_CARE_PROVIDER_SITE_OTHER): Payer: Medicare Other | Admitting: Cardiology

## 2014-07-17 VITALS — BP 162/101 | HR 75 | Ht 68.0 in | Wt 232.7 lb

## 2014-07-17 DIAGNOSIS — I10 Essential (primary) hypertension: Secondary | ICD-10-CM | POA: Diagnosis not present

## 2014-07-17 DIAGNOSIS — I482 Chronic atrial fibrillation, unspecified: Secondary | ICD-10-CM

## 2014-07-17 DIAGNOSIS — I5022 Chronic systolic (congestive) heart failure: Secondary | ICD-10-CM | POA: Diagnosis not present

## 2014-07-17 NOTE — Patient Instructions (Signed)
Continue your current therapy and monitor your blood pressure  I will see you in 6 months.

## 2014-07-17 NOTE — Progress Notes (Signed)
Samuel Moyer Date of Birth: 12/06/1940   History of Present Illness: Samuel Moyer is seen today for followup of CHF and atrial fibrillation. He has a history of permanent atrial fibrillation. He also is a history of congestive heart failure with ejection fraction of 40-45%. Normal myoview in 2011 with EF of 50% at that time.  On followup today he reports he is doing well. In March he was taken off amlodipine and started on HCTZ due to swelling in his left leg. This has improved. He reports BP at home has been doing OK. No chest pain or SOB. He is unaware of his Afib. Only gets SOB when he is mowing uphill in the heat.   Current Outpatient Prescriptions on File Prior to Visit  Medication Sig Dispense Refill  . atorvastatin (LIPITOR) 10 MG tablet TAKE 1 TABLET DAILY 90 tablet 2  . benazepril (LOTENSIN) 40 MG tablet TAKE 1 TABLET DAILY 90 tablet 2  . dabigatran (PRADAXA) 150 MG CAPS capsule TAKE 1 CAPSULE EVERY 12 HOURS 60 capsule 6  . furosemide (LASIX) 40 MG tablet Take 1 tablet (40 mg total) by mouth daily. 90 tablet 3  . glucose blood (ONE TOUCH ULTRA TEST) test strip DX:250.00, LABS OVERDUE, Check blood sugar daily as directed (Patient taking differently: 1 each by Other route as needed. DX:250.00, LABS OVERDUE, Check blood sugar daily as directed) 100 each 0  . hydrochlorothiazide (MICROZIDE) 12.5 MG capsule Take 1 capsule (12.5 mg total) by mouth daily. 90 capsule 1  . metoprolol tartrate (LOPRESSOR) 25 MG tablet TAKE 2 TABLETS TWICE A DAY 120 tablet 0  . Multiple Vitamin (MULTIVITAMIN) tablet Take 1 tablet by mouth daily.      [name] DELICA LANCETS 31D MISC 1 each by Other route daily. DX: 250.00, LABS OVERDUE, Check blood sugar daily as directed (Patient taking differently: 1 each by Other route as needed. DX: 250.00, LABS OVERDUE, Check blood sugar daily as directed) 100 each 0  . PARoxetine (PAXIL) 20 MG tablet TAKE ONE-HALF (1/2) TABLET DAILY 45 tablet 0   No current  facility-administered medications on file prior to visit.    No Known Allergies  Past Medical History  Diagnosis Date  . Anxiety   . Claustrophobia     Occasionally when flying   . Hypertension   . Hyperlipidemia   . OSA (obstructive sleep apnea)     CPAP machine   . Atrial fibrillation   . Colitis   . Hemorrhoids   . Diabetes mellitus, type 2   . PVC's (premature ventricular contractions)   . LV dysfunction     EF 40-45%  . CHF (congestive heart failure)   . A-fib     Past Surgical History  Procedure Laterality Date  . US echocardiography  11/15/2009    EF 40-45%  . Cardiovascular stress test  03/02/2010    EF 50%    History  Smoking status  . Former Smoker -- 1.00 packs/day for 16 years  . Types: Cigarettes  . Quit date: 06/30/1977  Smokeless tobacco  . Never Used    History  Alcohol Use No    Family History  Problem Relation Age of Onset  . Hypertension Father   . Heart attack Father     Age 2 (MI)  . Heart failure Father   . Colonic polyp Sister     and Father  . Colon cancer Neg Hx   . Stomach cancer Neg Hx   . Stroke Mother  Review of Systems: As noted in history of present illness  All other systems were reviewed and are negative.  Physical Exam: BP 162/101 mmHg  Pulse 75  Ht 5\' 8"  (1.727 m)  Wt 105.552 kg (232 lb 11.2 oz)  BMI 35.39 kg/m2 The patient is alert and oriented x 3.   The skin is warm and dry.    The HEENT exam  is normal.  The carotids are 2+ without bruits.  There is no thyromegaly.  There is no JVD.  The lungs are clear.   The heart exam reveals  an irregular  rate with a normal S1 and S2.  There are no murmurs, gallops, or rubs.  The PMI is not displaced.   Abdominal exam reveals good bowel sounds. Exam of the legs reveals  no pretibial  Trace ankle swelling on the left.   The distal pulses are intact.  Cranial nerves II - XII are intact.  Motor and sensory functions are intact.  The gait is normal.  LABORATORY DATA: Lab  Results  Component Value Date   WBC 11.6* 12/17/2013   HGB 14.2 12/17/2013   HCT 42.1 12/17/2013   PLT 290 12/17/2013   GLUCOSE 123* 06/23/2014   CHOL 150 03/03/2014   TRIG 93.0 03/03/2014   HDL 53.20 03/03/2014   LDLCALC 78 03/03/2014   ALT 15 03/03/2014   AST 18 03/03/2014   NA 140 06/23/2014   K 4.5 06/23/2014   CL 102 06/23/2014   CREATININE 1.06 06/23/2014   BUN 17 06/23/2014   CO2 33* 06/23/2014   TSH 1.30 06/13/2013   PSA 0.63 09/12/2007   HGBA1C 6.2 03/03/2014   MICROALBUR 1.8 06/13/2013   Ecg today shows atrial fibrillation with rate 75 bpm. Occ. PVC. Otherwise normal. I have personally reviewed and interpreted this study.  Assessment / Plan: 1. Permanent atrial fibrillation. Rate is well controlled.  Continue metoprolol.  He is on chronic anticoagulation with Pradaxa. Renal function has been normal.  2. Congestive heart failure with chronic systolic dysfunction. Ejection fraction is mildly reduced. Continue ACE inhibitor, beta blocker, and diuretic therapy.  Follow up in 6 months.  3. Hypertension, BP is quite high today but was well controlled on recent visits with Dr Linna Darner and Dr. Elsworth Soho. Reports good control at home. Will continue to monitor.

## 2014-07-26 ENCOUNTER — Other Ambulatory Visit: Payer: Self-pay | Admitting: Internal Medicine

## 2014-07-27 ENCOUNTER — Other Ambulatory Visit: Payer: Self-pay | Admitting: Emergency Medicine

## 2014-07-27 ENCOUNTER — Other Ambulatory Visit: Payer: Self-pay

## 2014-07-27 MED ORDER — PAROXETINE HCL 20 MG PO TABS: 10.0000 mg | ORAL_TABLET | Freq: Every day | ORAL | Status: DC

## 2014-08-06 DIAGNOSIS — H40013 Open angle with borderline findings, low risk, bilateral: Secondary | ICD-10-CM | POA: Diagnosis not present

## 2014-08-14 ENCOUNTER — Telehealth: Payer: Self-pay | Admitting: Pulmonary Disease

## 2014-08-14 NOTE — Telephone Encounter (Signed)
Last OV: 06/23/14 CPAP 14cm Residual AHI 12/hr Good usage No changes

## 2014-08-14 NOTE — Telephone Encounter (Signed)
I spoke with patient about results and he verbalized understanding and had no questions 

## 2014-10-22 ENCOUNTER — Encounter: Payer: Self-pay | Admitting: Cardiology

## 2014-10-31 ENCOUNTER — Other Ambulatory Visit: Payer: Self-pay | Admitting: Internal Medicine

## 2014-11-04 ENCOUNTER — Other Ambulatory Visit: Payer: Self-pay

## 2014-11-04 MED ORDER — DABIGATRAN ETEXILATE MESYLATE 150 MG PO CAPS: ORAL_CAPSULE | ORAL | Status: DC

## 2014-11-05 ENCOUNTER — Telehealth: Payer: Self-pay | Admitting: Cardiology

## 2014-11-09 NOTE — Telephone Encounter (Signed)
Close encounter 

## 2014-11-24 DIAGNOSIS — Z23 Encounter for immunization: Secondary | ICD-10-CM | POA: Diagnosis not present

## 2014-12-03 ENCOUNTER — Other Ambulatory Visit: Payer: Self-pay | Admitting: Internal Medicine

## 2014-12-17 DIAGNOSIS — L820 Inflamed seborrheic keratosis: Secondary | ICD-10-CM | POA: Diagnosis not present

## 2014-12-28 ENCOUNTER — Ambulatory Visit (INDEPENDENT_AMBULATORY_CARE_PROVIDER_SITE_OTHER): Payer: Medicare Other | Admitting: Adult Health

## 2014-12-28 ENCOUNTER — Encounter: Payer: Self-pay | Admitting: Adult Health

## 2014-12-28 VITALS — BP 130/88 | HR 62 | Temp 97.5°F | Ht 68.0 in | Wt 235.0 lb

## 2014-12-28 DIAGNOSIS — E669 Obesity, unspecified: Secondary | ICD-10-CM

## 2014-12-28 DIAGNOSIS — G4733 Obstructive sleep apnea (adult) (pediatric): Secondary | ICD-10-CM | POA: Diagnosis not present

## 2014-12-28 NOTE — Assessment & Plan Note (Signed)
Improved control on CPAP - residual AHI still not optimal but improved on current pressure.  No perceived daytime sleepiness and feels rested , no changes for now   Plan  Continue on C Pap at bedtime. Goal is to wear for at least 6 hours Continue to work on weight loss. Do not drive if  sleepy Follow with Dr. Elsworth Soho in 6 months and as needed

## 2014-12-28 NOTE — Patient Instructions (Signed)
Continue on C Pap at bedtime. Goal is to wear for at least 6 hours Continue to work on weight loss. Do not drive if  sleepy Follow with Dr. Elsworth Soho in 6 months and as needed

## 2014-12-28 NOTE — Assessment & Plan Note (Signed)
Cont w/ weight loss.

## 2014-12-28 NOTE — Progress Notes (Signed)
Subjective:    Patient ID: Samuel Moyer, male    DOB: 07-22-1940, 74 y.o.   MRN: [id number]  HPI  74 yo remote smoker (quit '79) retired [employer] worker for FU of obstructive sleep apnea  He has chronic atrial fibrillation & LV dysfunction ,  TEST   EF 40-45%, failed cardioversion in dec'11.  PSG 04/06/10 (wt 200) showed severe obstructive sleep apnea with AHI 66/h, nadir desatn 75% corrrected by CPAP 10 cm to AHI 4.5/h. Higher pressures were associated with emergence of central events.  Download 4/30 -07/18/10 >>excellent usage 7h, residual AI 11/h  03/2011 Download shows good compliance, residual ahi 11/h  Download 03/2012 >> residual AHI 15/h on cpap 10 cm, good usage Download 03/2013 - AHI 21/h on 10 cm,EPR 3, good usage    12/28/2014 Follow up : OSA  Patient returns for a four-month follow-up for severe sleep apnea He remains on nocturnal C Pap. Patient feels that is helping and he is rested without any significant daytime sleepiness. Download from October 23 through November 22 shows excellent compliance with average use it, ad 7 hours. He is on a set pressure of 14 cm H2O. AHI 18.7. (this is down since pressure increased ) .  No mask issues . Gets regular mask and tubing changes.  Denies chest pain , orthopnea or increased edema.     Past Medical History  Diagnosis Date  . Anxiety   . Claustrophobia     Occasionally when flying   . Hypertension   . Hyperlipidemia   . OSA (obstructive sleep apnea)     CPAP machine   . Atrial fibrillation ([place])   . Colitis   . Hemorrhoids   . Diabetes mellitus, type 2 ([place])   . PVC's (premature ventricular contractions)   . LV dysfunction     EF 40-45%  . CHF (congestive heart failure) ([place])   . A-fib [facility])    Current Outpatient Prescriptions on File Prior to Visit  Medication Sig Dispense Refill  . atorvastatin (LIPITOR) 10 MG tablet TAKE 1 TABLET DAILY 90 tablet 1  . benazepril (LOTENSIN) 40 MG tablet TAKE 1 TABLET DAILY 90 tablet  2  . dabigatran (PRADAXA) 150 MG CAPS capsule TAKE 1 CAPSULE EVERY 12 HOURS 180 capsule 3  . furosemide (LASIX) 40 MG tablet Take 1 tablet (40 mg total) by mouth daily. 90 tablet 3  . glucose blood (ONE TOUCH ULTRA TEST) test strip DX:250.00, LABS OVERDUE, Check blood sugar daily as directed (Patient taking differently: 1 each by Other route as needed. DX:250.00, LABS OVERDUE, Check blood sugar daily as directed) 100 each 0  . hydrochlorothiazide (MICROZIDE) 12.5 MG capsule TAKE 1 CAPSULE DAILY 90 capsule 0  . metoprolol tartrate (LOPRESSOR) 25 MG tablet TAKE 2 TABLETS TWICE A DAY 120 tablet 0  . Multiple Vitamin (MULTIVITAMIN) tablet Take 1 tablet by mouth daily.      [name] DELICA LANCETS 99991111 MISC 1 each by Other route daily. DX: 250.00, LABS OVERDUE, Check blood sugar daily as directed (Patient taking differently: 1 each by Other route as needed. DX: 250.00, LABS OVERDUE, Check blood sugar daily as directed) 100 each 0  . PARoxetine (PAXIL) 20 MG tablet Take 0.5 tablets (10 mg total) by mouth daily. 45 tablet 3   No current facility-administered medications on file prior to visit.     Review of Systems Constitutional:   No  weight loss, night sweats,  Fevers, chills,  +fatigue, or  lassitude.  HEENT:  No headaches,  Difficulty swallowing,  Tooth/dental problems, or  Sore throat,                No sneezing, itching, ear ache, nasal congestion, post nasal drip,   CV:  No chest pain,  Orthopnea, PND, swelling in lower extremities, anasarca, dizziness, palpitations, syncope.   GI  No heartburn, indigestion, abdominal pain, nausea, vomiting, diarrhea, change in bowel habits, loss of appetite, bloody stools.   Resp: No shortness of breath with exertion or at rest.  No excess mucus, no productive cough,  No non-productive cough,  No coughing up of blood.  No change in color of mucus.  No wheezing.  No chest wall deformity  Skin: no rash or lesions.  GU: no dysuria, change in color of  urine, no urgency or frequency.  No flank pain, no hematuria   MS:  No joint pain or swelling.  No decreased range of motion.  No back pain.  Psych:  No change in mood or affect. No depression or anxiety.  No memory loss.         Objective:   Physical Exam  GEN: A/Ox3; pleasant , NAD,  Obese   HEENT:  [place]/AT,  EACs-clear, TMs-wnl, NOSE-clear, THROAT-clear, no lesions, no postnasal drip or exudate noted.  Class 2- MP airway  NECK:  Supple w/ fair ROM; no JVD; normal carotid impulses w/o bruits; no thyromegaly or nodules palpated; no lymphadenopathy.  RESP  Clear  P & A; w/o, wheezes/ rales/ or rhonchi.no accessory muscle use, no dullness to percussion  CARD:  RRR, no m/r/g  , no peripheral edema, pulses intact, no cyanosis or clubbing.  GI:   Soft & nt; nml bowel sounds; no organomegaly or masses detected.  Musco: Warm bil, no deformities or joint swelling noted.   Neuro: alert, no focal deficits noted.    Skin: Warm, no lesions or rashes        Assessment & Plan:

## 2014-12-29 NOTE — Progress Notes (Signed)
Reviewed & agree with plan  

## 2015-01-02 ENCOUNTER — Encounter: Payer: Self-pay | Admitting: Internal Medicine

## 2015-01-02 ENCOUNTER — Ambulatory Visit (INDEPENDENT_AMBULATORY_CARE_PROVIDER_SITE_OTHER): Payer: Medicare Other | Admitting: Internal Medicine

## 2015-01-02 VITALS — BP 146/96 | HR 81 | Temp 97.9°F | Ht 68.0 in | Wt 236.4 lb

## 2015-01-02 DIAGNOSIS — H9313 Tinnitus, bilateral: Secondary | ICD-10-CM

## 2015-01-02 NOTE — Patient Instructions (Signed)
Your exam is normal.  Monitor your symptoms and if they return or change please let us know or see your ENT.

## 2015-01-02 NOTE — Progress Notes (Signed)
Pre visit review using our clinic review tool, if applicable. No additional management support is needed unless otherwise documented below in the visit note. 

## 2015-01-02 NOTE — Progress Notes (Signed)
Subjective:    Patient ID: Samuel Moyer, male    DOB: [birth year], 74 y.o.   MRN: [id number]  HPI He is here for an acute visit for hissing in his ears and a stiff neck.  He has had hissing in his ears this morning that woke him up.  It was loud and sounded like air leaking from his cpap machine. He had his cpap machine on and turned off his machine and he still heard it.  He went into another room to see if it was something in his room.  It was not from his room - it was his ears - both ears. The volume of the hissing has decreased since this morning.  He was concerned about a stroke and tried moving everything -- there were no deficits. He had no difficulty moving his tongue, eyes, etc.     He does have some mild cold symptoms.  It is off and on and mild.  He has post nasal drip in the morning. And mild cough only in the morning.  He did not feel more congested this morning.  He denies any changes in his hearing recently.    He still has a mild hissing sound now, but he thinks that is normal. He thinks he has a little tinnitus.  He wears hearing aides.    His bp at home this morning was 148/99 - it is usually better controlled.  His neck was stiff only when he woke up and he thinks it is from how he slept.  He denies neck pain now.    Overall, right now he feels at his baseline.    He denies any changes in meds or supplements.  He has just started to use a cleaning machine for his cpap and has used it twice.   Medications and allergies reviewed with patient and updated if appropriate.  Patient Active Problem List   Diagnosis Date Noted  . Obesity 12/28/2014  . Edema 04/07/2014  . Elevated sedimentation rate 03/02/2014  . Venous (peripheral) insufficiency 02/16/2014  . Erysipelas of lower extremity 02/02/2014  . OSA (obstructive sleep apnea) 05/24/2010  . ATRIAL FIBRILLATION  01/26/2010  . Chronic systolic heart failure ([place]) 01/26/2010  . UNSPECIFIED CARDIAC DYSRHYTHMIA  01/14/2010  . 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 Hyperglycemia 12/10/2008  . Hypercholesterolemia 10/21/2007  . ANXIETY 10/21/2007  . Essential hypertension 02/26/2006  . COLONIC POLYPS, HX OF 02/26/2006    Current Outpatient Prescriptions on File Prior to Visit  Medication Sig Dispense Refill  . atorvastatin (LIPITOR) 10 MG tablet TAKE 1 TABLET DAILY 90 tablet 1  . benazepril (LOTENSIN) 40 MG tablet TAKE 1 TABLET DAILY 90 tablet 2  . dabigatran (PRADAXA) 150 MG CAPS capsule TAKE 1 CAPSULE EVERY 12 HOURS 180 capsule 3  . furosemide (LASIX) 40 MG tablet Take 1 tablet (40 mg total) by mouth daily. 90 tablet 3  . glucose blood (ONE TOUCH ULTRA TEST) test strip DX:250.00, LABS OVERDUE, Check blood sugar daily as directed (Patient taking differently: 1 each by Other route as needed. DX:E11.09 Check blood sugar daily as directed) 100 each 0  . hydrochlorothiazide (MICROZIDE) 12.5 MG capsule TAKE 1 CAPSULE DAILY 90 capsule 0  . metoprolol tartrate (LOPRESSOR) 25 MG tablet TAKE 2 TABLETS TWICE A DAY 120 tablet 0  . Multiple Vitamin (MULTIVITAMIN) tablet Take 1 tablet by mouth daily.      [name] DELICA LANCETS 99991111 MISC 1 each by Other route daily. DX: 250.00,  LABS OVERDUE, Check blood sugar daily as directed (Patient taking differently: 1 each by Other route as needed. DX:E11.09, Check blood sugar daily as directed) 100 each 0  . PARoxetine (PAXIL) 20 MG tablet Take 0.5 tablets (10 mg total) by mouth daily. 45 tablet 3   No current facility-administered medications on file prior to visit.    Past Medical History  Diagnosis Date  . Anxiety   . Claustrophobia     Occasionally when flying   . Hypertension   . Hyperlipidemia   . OSA (obstructive sleep apnea)     CPAP machine   . Atrial fibrillation ([place])   . Colitis   . Hemorrhoids   . Diabetes mellitus, type 2 ([place])   . PVC's (premature ventricular contractions)   . LV dysfunction     EF 40-45%  . CHF (congestive heart failure)  ([place])   . A-fib [facility])     Past Surgical History  Procedure Laterality Date  . US echocardiography  11/15/2009    EF 40-45%  . Cardiovascular stress test  03/02/2010    EF 50%    Social History   Social History  . Marital Status: Married    Spouse Name: [name]  . Number of Children: 3  . Years of Education: [name]   Occupational History  . Retired     [employer]    Social History Main Topics  . Smoking status: Former Smoker -- 1.00 packs/day for 16 years    Types: Cigarettes    Quit date: 06/30/1977  . Smokeless tobacco: Never Used  . Alcohol Use: No  . Drug Use: No  . Sexual Activity: Not Asked   Other Topics Concern  . None   Social History Narrative   2 caffeine drinks daily     Review of Systems  Constitutional: Negative for fever and chills.  HENT: Positive for congestion (in morning) and postnasal drip. Negative for ear discharge, ear pain, hearing loss, sinus pressure, sore throat and trouble swallowing.   Respiratory: Positive for cough (in morning only) and wheezing (occasional - not new). Negative for shortness of breath.   Cardiovascular: Negative for chest pain and palpitations.  Musculoskeletal: Positive for neck pain (this morning only from sleeping wrong).  Neurological: Negative for dizziness, speech difficulty, weakness, light-headedness, numbness and headaches.  Psychiatric/Behavioral: Negative for confusion.       Objective:   Filed Vitals:   01/02/15 1036  BP: 146/96  Pulse: 81  Temp: 97.9 F (36.6 C)   Filed Weights   01/02/15 1036  Weight: 236 lb 6 oz (107.219 kg)   Body mass index is 35.95 kg/(m^2).   Physical Exam  Constitutional: He is oriented to person, place, and time. He appears well-developed and well-nourished. No distress.  HENT:  Head: Normocephalic and atraumatic.  Right Ear: External ear normal.  Left Ear: External ear normal.  Mouth/Throat: Oropharynx is clear and moist.  Right ear canal mild amount of wax deep in  ear canal, left ear canal with minimal amount of dried wax.  No erythema b/l  Eyes: Conjunctivae are normal. Right eye exhibits no discharge. Left eye exhibits no discharge.  Neck: Neck supple. No tracheal deviation present. No thyromegaly present.  Cardiovascular: Normal rate, regular rhythm and normal heart sounds.   No murmur heard. Pulmonary/Chest: Effort normal and breath sounds normal. No respiratory distress. He has no wheezes. He has no rales.  Musculoskeletal: He exhibits no edema.  Lymphadenopathy:    He has no cervical adenopathy.  Neurological: He is alert and oriented to person, place, and time. No cranial nerve deficit.  Normal sensation and strength in all extremities  Skin: Skin is warm and dry. He is not diaphoretic.  Psychiatric: He has a normal mood and affect. His behavior is normal.          Assessment & Plan:    Hissing in ears - likely tinnitus At this point his symptoms are at baseline and his exam is normal, no concerning symptoms or signs No further evaluation or treatment is needed now He has chronic tinnitus and likely it was amplified this morning He will just monitor for now and follow up with his ENT if needed

## 2015-01-03 ENCOUNTER — Other Ambulatory Visit: Payer: Self-pay | Admitting: Cardiology

## 2015-01-04 NOTE — Telephone Encounter (Signed)
REFILL 

## 2015-01-13 ENCOUNTER — Encounter: Payer: Self-pay | Admitting: Adult Health

## 2015-01-29 ENCOUNTER — Other Ambulatory Visit: Payer: Self-pay | Admitting: Internal Medicine

## 2015-01-30 ENCOUNTER — Encounter: Payer: Self-pay | Admitting: Cardiology

## 2015-02-03 ENCOUNTER — Other Ambulatory Visit: Payer: Self-pay | Admitting: *Deleted

## 2015-02-03 MED ORDER — METOPROLOL TARTRATE 25 MG PO TABS: 50.0000 mg | ORAL_TABLET | Freq: Two times a day (BID) | ORAL | Status: DC

## 2015-02-16 ENCOUNTER — Ambulatory Visit (INDEPENDENT_AMBULATORY_CARE_PROVIDER_SITE_OTHER): Payer: Medicare Other | Admitting: Cardiology

## 2015-02-16 ENCOUNTER — Encounter: Payer: Self-pay | Admitting: Cardiology

## 2015-02-16 VITALS — BP 140/90 | HR 68 | Ht 68.0 in | Wt 240.0 lb

## 2015-02-16 DIAGNOSIS — I482 Chronic atrial fibrillation, unspecified: Secondary | ICD-10-CM

## 2015-02-16 DIAGNOSIS — I10 Essential (primary) hypertension: Secondary | ICD-10-CM

## 2015-02-16 DIAGNOSIS — I5022 Chronic systolic (congestive) heart failure: Secondary | ICD-10-CM | POA: Diagnosis not present

## 2015-02-16 DIAGNOSIS — E7800 Pure hypercholesterolemia, unspecified: Secondary | ICD-10-CM | POA: Diagnosis not present

## 2015-02-16 MED ORDER — DABIGATRAN ETEXILATE MESYLATE 150 MG PO CAPS: ORAL_CAPSULE | ORAL | Status: DC

## 2015-02-16 MED ORDER — FUROSEMIDE 40 MG PO TABS: 40.0000 mg | ORAL_TABLET | Freq: Every day | ORAL | Status: DC

## 2015-02-16 MED ORDER — METOPROLOL TARTRATE 25 MG PO TABS: 50.0000 mg | ORAL_TABLET | Freq: Two times a day (BID) | ORAL | Status: DC

## 2015-02-16 NOTE — Progress Notes (Signed)
Samuel Moyer Date of Birth: 1940/05/14   History of Present Illness: Samuel Moyer is seen today for followup of CHF and atrial fibrillation. He has a history of permanent atrial fibrillation. He also is a history of congestive heart failure with ejection fraction of 40-45%. Normal myoview in 2011 with EF of 50% at that time.  On followup today he reports he is doing well. He denies any increase in edema.  He reports BP at home has been doing OK. No chest pain or SOB. He is unaware of his Afib. He does have some sinus drainage with a cough in the early am.   Current Outpatient Prescriptions on File Prior to Visit  Medication Sig Dispense Refill  . atorvastatin (LIPITOR) 10 MG tablet TAKE 1 TABLET DAILY 90 tablet 1  . benazepril (LOTENSIN) 40 MG tablet TAKE 1 TABLET DAILY 90 tablet 1  . glucose blood (ONE TOUCH ULTRA TEST) test strip DX:250.00, LABS OVERDUE, Check blood sugar daily as directed (Patient taking differently: 1 each by Other route as needed. DX:E11.09 Check blood sugar daily as directed) 100 each 0  . hydrochlorothiazide (MICROZIDE) 12.5 MG capsule TAKE 1 CAPSULE DAILY 90 capsule 0  . Multiple Vitamin (MULTIVITAMIN) tablet Take 1 tablet by mouth daily.      Samuel Moyer DELICA LANCETS 99991111 MISC 1 each by Other route daily. DX: 250.00, LABS OVERDUE, Check blood sugar daily as directed (Patient taking differently: 1 each by Other route as needed. DX:E11.09, Check blood sugar daily as directed) 100 each 0  . PARoxetine (PAXIL) 20 MG tablet Take 0.5 tablets (10 mg total) by mouth daily. 45 tablet 3   No current facility-administered medications on file prior to visit.    No Known Allergies  Past Medical History  Diagnosis Date  . Anxiety   . Claustrophobia     Occasionally when flying   . Hypertension   . Hyperlipidemia   . OSA (obstructive sleep apnea)     CPAP machine   . Atrial fibrillation ([place])   . Colitis   . Hemorrhoids   . Diabetes mellitus, type 2 ([place])   . PVC's  (premature ventricular contractions)   . LV dysfunction     EF 40-45%  . CHF (congestive heart failure) ([place])   . A-fib [facility])     Past Surgical History  Procedure Laterality Date  . US echocardiography  11/15/2009    EF 40-45%  . Cardiovascular stress test  03/02/2010    EF 50%    History  Smoking status  . Former Smoker -- 1.00 packs/day for 16 years  . Types: Cigarettes  . Quit date: 06/30/1977  Smokeless tobacco  . Never Used    History  Alcohol Use No    Family History  Problem Relation Age of Onset  . Hypertension Father   . Heart attack Father     Age 12 (MI)  . Heart failure Father   . Colonic polyp Sister     and Father  . Colon cancer Neg Hx   . Stomach cancer Neg Hx   . Stroke Mother     Review of Systems: As noted in history of present illness  All other systems were reviewed and are negative.  Physical Exam: BP 140/90 mmHg  Pulse 68  Ht 5\' 8"  (1.727 m)  Wt 108.863 kg (240 lb)  BMI 36.50 kg/m2 The patient is alert and oriented x 3.   The skin is warm and dry.    The  HEENT exam  is normal.  The carotids are 2+ without bruits.  There is no thyromegaly.  There is no JVD.  The lungs are clear.   The heart exam reveals  an irregular  rate with a normal S1 and S2.  There are no murmurs, gallops, or rubs.  The PMI is not displaced.   Abdominal exam reveals good bowel sounds. Exam of the legs reveals  Trace ankle swelling on the left.   The distal pulses are intact.  Cranial nerves II - XII are intact.  Motor and sensory functions are intact.  The gait is normal.  LABORATORY DATA: Lab Results  Component Value Date   WBC 11.6* 12/17/2013   HGB 14.2 12/17/2013   HCT 42.1 12/17/2013   PLT 290 12/17/2013   GLUCOSE 123* 06/23/2014   CHOL 150 03/03/2014   TRIG 93.0 03/03/2014   HDL 53.20 03/03/2014   LDLCALC 78 03/03/2014   ALT 15 03/03/2014   AST 18 03/03/2014   NA 140 06/23/2014   K 4.5 06/23/2014   CL 102 06/23/2014   CREATININE 1.06 06/23/2014    BUN 17 06/23/2014   CO2 33* 06/23/2014   TSH 1.30 06/13/2013   PSA 0.63 09/12/2007   HGBA1C 6.2 03/03/2014   MICROALBUR 1.8 06/13/2013    Assessment / Plan: 1. Permanent atrial fibrillation. Rate is well controlled.  Continue metoprolol.  He is on chronic anticoagulation with Pradaxa. Renal function has been normal.  2. Congestive heart failure with chronic systolic dysfunction. Ejection fraction is mildly reduced. Continue ACE inhibitor, beta blocker, and diuretic therapy.  Follow up in 6 months.  3. Hypertension, BP is well controlled.  Plan: encourage  Weight loss. He is due for follow up lab work and is going to have this arranged with primary care.

## 2015-02-16 NOTE — Patient Instructions (Signed)
Continue your current therapy  I will see you in 6 months.   

## 2015-03-02 ENCOUNTER — Other Ambulatory Visit: Payer: Self-pay | Admitting: Internal Medicine

## 2015-03-02 NOTE — Telephone Encounter (Signed)
Spoke with pts wife, she stated that he will be switching to Dr Quay Burow. Advised that he will need to call back and make an appt for follow-up BP// Establish care for refills to be sent.

## 2015-03-05 ENCOUNTER — Encounter: Payer: Self-pay | Admitting: Internal Medicine

## 2015-03-05 ENCOUNTER — Ambulatory Visit (INDEPENDENT_AMBULATORY_CARE_PROVIDER_SITE_OTHER): Payer: Medicare Other | Admitting: Internal Medicine

## 2015-03-05 VITALS — BP 146/82 | HR 75 | Temp 97.5°F | Resp 18 | Wt 238.0 lb

## 2015-03-05 DIAGNOSIS — E7800 Pure hypercholesterolemia, unspecified: Secondary | ICD-10-CM | POA: Diagnosis not present

## 2015-03-05 DIAGNOSIS — I482 Chronic atrial fibrillation, unspecified: Secondary | ICD-10-CM

## 2015-03-05 DIAGNOSIS — Z23 Encounter for immunization: Secondary | ICD-10-CM | POA: Diagnosis not present

## 2015-03-05 DIAGNOSIS — I5022 Chronic systolic (congestive) heart failure: Secondary | ICD-10-CM

## 2015-03-05 DIAGNOSIS — I10 Essential (primary) hypertension: Secondary | ICD-10-CM

## 2015-03-05 DIAGNOSIS — R739 Hyperglycemia, unspecified: Secondary | ICD-10-CM

## 2015-03-05 DIAGNOSIS — R7303 Prediabetes: Secondary | ICD-10-CM

## 2015-03-05 DIAGNOSIS — E669 Obesity, unspecified: Secondary | ICD-10-CM

## 2015-03-05 MED ORDER — ONETOUCH DELICA LANCETS 33G MISC: 1.0000 | Freq: Every day | Status: DC

## 2015-03-05 MED ORDER — GLUCOSE BLOOD VI STRP: ORAL_STRIP | Status: DC

## 2015-03-05 NOTE — Assessment & Plan Note (Signed)
Rate controlled afib Asymptomatic Continue current medications

## 2015-03-05 NOTE — Assessment & Plan Note (Signed)
Check lipid panel, cmp Continue lipitor 10 mg daily - will titrate if needed

## 2015-03-05 NOTE — Progress Notes (Signed)
Pre visit review using our clinic review tool, if applicable. No additional management support is needed unless otherwise documented below in the visit note. 

## 2015-03-05 NOTE — Assessment & Plan Note (Addendum)
BP averaging 140/90 BP a little high Stressed low sodium Start regular exercise Weight loss Will hold of on med changes now Follow up in 3 months

## 2015-03-05 NOTE — Progress Notes (Signed)
Subjective:    Patient ID: Samuel Moyer, male    DOB: [birth year], 75 y.o.   MRN: [id number]  HPI He is here for follow up.  Hypertension, Afib, CHF: He follows with cardiology.  He is taking his medication daily. He is not compliant with a low sodium diet.  He denies chest pain, palpitations, edema, shortness of breath and regular headaches. He is not exercising regularly.  He does monitor his blood pressure at home - typically  140/90.    Hyperlipidemia: He is taking his medication daily. He is not compliant with a low fat/cholesterol diet. He is not exercising regularly. He denies myalgias.   Anxiety.  He is taking his medication daily as prescribed. He denies any side effects from the medication. He feels his anxiety is well controlled and he is happy with his current dose of medication.   OSA:  He follows with pulmonary and uses his cpap nightly.    Prediabetes, obesity: He does check his sugars first thing in the morning and it is usually 120-130.  He is not exercising.  He is not compliant with a diabetic diet.  He has gained weight.    Medications and allergies reviewed with patient and updated if appropriate.  Patient Active Problem List   Diagnosis Date Noted  . Prediabetes 03/05/2015  . Hyperglycemia 03/05/2015  . Obesity 12/28/2014  . Edema 04/07/2014  . Elevated sedimentation rate 03/02/2014  . Venous (peripheral) insufficiency 02/16/2014  . Erysipelas of lower extremity 02/02/2014  . OSA (obstructive sleep apnea) 05/24/2010  . ATRIAL FIBRILLATION  01/26/2010  . Chronic systolic heart failure ([place]) 01/26/2010  . UNSPECIFIED CARDIAC DYSRHYTHMIA 01/14/2010  . 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 Hypercholesterolemia 10/21/2007  . ANXIETY 10/21/2007  . Essential hypertension 02/26/2006  . COLONIC POLYPS, HX OF 02/26/2006    Current Outpatient Prescriptions on File Prior to Visit  Medication Sig Dispense Refill  . atorvastatin (LIPITOR) 10 MG tablet TAKE 1 TABLET  DAILY 90 tablet 1  . benazepril (LOTENSIN) 40 MG tablet TAKE 1 TABLET DAILY 90 tablet 1  . dabigatran (PRADAXA) 150 MG CAPS capsule TAKE 1 CAPSULE EVERY 12 HOURS 180 capsule 3  . furosemide (LASIX) 40 MG tablet Take 1 tablet (40 mg total) by mouth daily. 90 tablet 3  . glucose blood (ONE TOUCH ULTRA TEST) test strip DX:250.00, LABS OVERDUE, Check blood sugar daily as directed (Patient taking differently: 1 each by Other route as needed. DX:E11.09 Check blood sugar as directed) 100 each 0  . hydrochlorothiazide (MICROZIDE) 12.5 MG capsule Take 1 capsule (12.5 mg total) by mouth daily. 90 capsule 0  . metoprolol tartrate (LOPRESSOR) 25 MG tablet Take 2 tablets (50 mg total) by mouth 2 (two) times daily. 360 tablet 3  . Multiple Vitamin (MULTIVITAMIN) tablet Take 1 tablet by mouth daily.      [name] DELICA LANCETS 99991111 MISC 1 each by Other route daily. DX: 250.00, LABS OVERDUE, Check blood sugar daily as directed (Patient taking differently: 1 each by Other route as needed. DX:E11.09, Check blood sugar as directed) 100 each 0  . PARoxetine (PAXIL) 20 MG tablet Take 0.5 tablets (10 mg total) by mouth daily. 45 tablet 3   No current facility-administered medications on file prior to visit.    Past Medical History  Diagnosis Date  . Anxiety   . Claustrophobia     Occasionally when flying   . Hypertension   . Hyperlipidemia   . OSA (obstructive sleep apnea)  CPAP machine   . Atrial fibrillation ([place])   . Colitis   . Hemorrhoids   . Diabetes mellitus, type 2 ([place])   . PVC's (premature ventricular contractions)   . LV dysfunction     EF 40-45%  . CHF (congestive heart failure) ([place])   . A-fib [facility])     Past Surgical History  Procedure Laterality Date  . US echocardiography  11/15/2009    EF 40-45%  . Cardiovascular stress test  03/02/2010    EF 50%    Social History   Social History  . Marital Status: Married    Spouse Name: [name]  . Number of Children: 3  . Years of  Education: [name]   Occupational History  . Retired     [employer]    Social History Main Topics  . Smoking status: Former Smoker -- 1.00 packs/day for 16 years    Types: Cigarettes    Quit date: 06/30/1977  . Smokeless tobacco: Never Used  . Alcohol Use: No  . Drug Use: No  . Sexual Activity: Not on file   Other Topics Concern  . Not on file   Social History Narrative   2 caffeine drinks daily     Family History  Problem Relation Age of Onset  . Hypertension Father   . Heart attack Father     Age 78 (MI)  . Heart failure Father   . Colonic polyp Sister     and Father  . Colon cancer Neg Hx   . Stomach cancer Neg Hx   . Stroke Mother     Review of Systems  Constitutional: Negative for fever and chills.  Respiratory: Positive for wheezing (occasional). Negative for cough and shortness of breath.   Cardiovascular: Negative for chest pain, palpitations and leg swelling.  Gastrointestinal:       No GERD  Musculoskeletal: Negative for myalgias.  Neurological: Negative for dizziness, light-headedness and headaches.       Objective:   Filed Vitals:   03/05/15 1337  BP: 146/82  Pulse: 75  Temp: 97.5 F (36.4 C)  Resp: 18   Filed Weights   03/05/15 1337  Weight: 238 lb (107.956 kg)   Body mass index is 36.2 kg/(m^2).   Physical Exam Constitutional: Appears well-developed and well-nourished. No distress.  Neck: Neck supple. No tracheal deviation present. No thyromegaly present.  No carotid bruit. No cervical adenopathy.   Cardiovascular: Normal rate, irregular rhythm   1/6 systolic murmur.  1+ pitting edema with hyperpigmentation  Pulmonary/Chest: Effort normal and breath sounds normal. No respiratory distress. No wheezes.  Psych:  Normal mood and affect      Assessment & Plan:   See Problem List for Assessment and Plan of chronic medical problems.  Blood work ordered

## 2015-03-05 NOTE — Patient Instructions (Addendum)
  We have reviewed your prior records including labs and tests today.  Test(s) ordered today. Your results will be released to MyChart (or called to you) after review, usually within 72hours after test completion. If any changes need to be made, you will be notified at that same time.   Medications reviewed and updated. No changes recommended at this time.  Your prescription(s) have been submitted to your pharmacy. Please take as directed and contact our office if you believe you are having problem(s) with the medication(s).   Please schedule followup in 3 months      

## 2015-03-05 NOTE — Assessment & Plan Note (Signed)
Not compliant with a diabetic diet and not exercise, also obese Stressed lifestyle changes Check a1c Follow up in 3 months

## 2015-03-05 NOTE — Addendum Note (Signed)
Addended by: Terence Lux B on: 03/05/2015 03:38 PM   Modules accepted: Orders

## 2015-03-06 ENCOUNTER — Encounter: Payer: Self-pay | Admitting: Internal Medicine

## 2015-03-06 ENCOUNTER — Encounter: Payer: Self-pay | Admitting: Cardiology

## 2015-03-11 ENCOUNTER — Other Ambulatory Visit (INDEPENDENT_AMBULATORY_CARE_PROVIDER_SITE_OTHER): Payer: Medicare Other

## 2015-03-11 DIAGNOSIS — R739 Hyperglycemia, unspecified: Secondary | ICD-10-CM

## 2015-03-11 DIAGNOSIS — E7800 Pure hypercholesterolemia, unspecified: Secondary | ICD-10-CM | POA: Diagnosis not present

## 2015-03-11 DIAGNOSIS — I10 Essential (primary) hypertension: Secondary | ICD-10-CM

## 2015-03-11 DIAGNOSIS — R7303 Prediabetes: Secondary | ICD-10-CM

## 2015-03-11 LAB — COMPREHENSIVE METABOLIC PANEL
ALK PHOS: 60 U/L (ref 39–117)
ALT: 15 U/L (ref 0–53)
AST: 19 U/L (ref 0–37)
Albumin: 4.2 g/dL (ref 3.5–5.2)
BUN: 18 mg/dL (ref 6–23)
CALCIUM: 9.6 mg/dL (ref 8.4–10.5)
CO2: 33 mEq/L — ABNORMAL HIGH (ref 19–32)
Chloride: 102 mEq/L (ref 96–112)
Creatinine, Ser: 1.19 mg/dL (ref 0.40–1.50)
GFR: 63.25 mL/min (ref >60.00)
GLUCOSE: 130 mg/dL — AB (ref 70–99)
POTASSIUM: 4.2 meq/L (ref 3.5–5.1)
Sodium: 140 mEq/L (ref 135–145)
TOTAL PROTEIN: 7.1 g/dL (ref 6.0–8.3)
Total Bilirubin: 0.7 mg/dL (ref 0.2–1.2)

## 2015-03-11 LAB — LIPID PANEL
Cholesterol: 158 mg/dL (ref 0–200)
HDL: 50.8 mg/dL (ref >39.00)
LDL Cholesterol: 94 mg/dL (ref 0–99)
NONHDL: 107.45
TRIGLYCERIDES: 69 mg/dL (ref 0.0–149.0)
Total CHOL/HDL Ratio: 3
VLDL: 13.8 mg/dL (ref 0.0–40.0)

## 2015-03-11 LAB — HEMOGLOBIN A1C: HEMOGLOBIN A1C: 6.2 % (ref 4.6–6.5)

## 2015-03-13 ENCOUNTER — Encounter: Payer: Self-pay | Admitting: Internal Medicine

## 2015-04-29 ENCOUNTER — Other Ambulatory Visit: Payer: Self-pay | Admitting: Internal Medicine

## 2015-05-04 ENCOUNTER — Ambulatory Visit (INDEPENDENT_AMBULATORY_CARE_PROVIDER_SITE_OTHER): Payer: Medicare Other | Admitting: Internal Medicine

## 2015-05-04 ENCOUNTER — Encounter: Payer: Self-pay | Admitting: Internal Medicine

## 2015-05-04 VITALS — BP 130/70 | HR 75 | Temp 98.3°F | Resp 20 | Wt 236.0 lb

## 2015-05-04 DIAGNOSIS — I10 Essential (primary) hypertension: Secondary | ICD-10-CM

## 2015-05-04 DIAGNOSIS — Z20828 Contact with and (suspected) exposure to other viral communicable diseases: Secondary | ICD-10-CM

## 2015-05-04 MED ORDER — OSELTAMIVIR PHOSPHATE 75 MG PO CAPS: 75.0000 mg | ORAL_CAPSULE | Freq: Every day | ORAL | Status: DC

## 2015-05-04 NOTE — Progress Notes (Signed)
Pre visit review using our clinic review tool, if applicable. No additional management support is needed unless otherwise documented below in the visit note. 

## 2015-05-04 NOTE — Patient Instructions (Signed)
Please take all new medication as prescribed  - the tamiflu  Please continue all other medications as before, and refills have been done if requested.  Please have the pharmacy call with any other refills you may need.  Please keep your appointments with your specialists as you may have planned

## 2015-05-05 DIAGNOSIS — Z20828 Contact with and (suspected) exposure to other viral communicable diseases: Secondary | ICD-10-CM | POA: Insufficient documentation

## 2015-05-05 NOTE — Progress Notes (Signed)
Subjective:    Patient ID: Samuel Moyer, male    DOB: 1940/05/27, 75 y.o.   MRN: [id number]  HPI  Here with wife with same issue, both took care and spent all day in proximety of a grandchild who was proven Influenza + at eval last PM.  Denies fever, cough, ST and Pt denies chest pain, increased sob or doe, wheezing, orthopnea, PND, increased LE swelling, palpitations, dizziness or syncope. Past Medical History  Diagnosis Date  . Anxiety   . Claustrophobia     Occasionally when flying   . Hypertension   . Hyperlipidemia   . OSA (obstructive sleep apnea)     CPAP machine   . Atrial fibrillation ([place])   . Colitis   . Hemorrhoids   . Diabetes mellitus, type 2 ([place])   . PVC's (premature ventricular contractions)   . LV dysfunction     EF 40-45%  . CHF (congestive heart failure) ([place])   . A-fib [facility])    Past Surgical History  Procedure Laterality Date  . US echocardiography  11/15/2009    EF 40-45%  . Cardiovascular stress test  03/02/2010    EF 50%    reports that he quit smoking about 37 years ago. His smoking use included Cigarettes. He has a 16 pack-year smoking history. He has never used smokeless tobacco. He reports that he does not drink alcohol or use illicit drugs. family history includes Colonic polyp in his sister; Heart attack in his father; Heart failure in his father; Hypertension in his father; Stroke in his mother. There is no history of Colon cancer or Stomach cancer. No Known Allergies Current Outpatient Prescriptions on File Prior to Visit  Medication Sig Dispense Refill  . atorvastatin (LIPITOR) 10 MG tablet TAKE 1 TABLET DAILY 90 tablet 2  . benazepril (LOTENSIN) 40 MG tablet TAKE 1 TABLET DAILY 90 tablet 1  . dabigatran (PRADAXA) 150 MG CAPS capsule TAKE 1 CAPSULE EVERY 12 HOURS 180 capsule 3  . furosemide (LASIX) 40 MG tablet Take 1 tablet (40 mg total) by mouth daily. 90 tablet 3  . glucose blood (ONE TOUCH ULTRA TEST) test strip Check blood sugar daily as  directed 100 each 0  . hydrochlorothiazide (MICROZIDE) 12.5 MG capsule Take 1 capsule (12.5 mg total) by mouth daily. 90 capsule 0  . metoprolol tartrate (LOPRESSOR) 25 MG tablet Take 2 tablets (50 mg total) by mouth 2 (two) times daily. 360 tablet 3  . Multiple Vitamin (MULTIVITAMIN) tablet Take 1 tablet by mouth daily.      [name] DELICA LANCETS 99991111 MISC 1 each by Other route daily. Check blood sugar daily as directed 100 each 0  . PARoxetine (PAXIL) 20 MG tablet Take 0.5 tablets (10 mg total) by mouth daily. 45 tablet 3   No current facility-administered medications on file prior to visit.   Review of Systems All otherwise neg per pt     Objective:   Physical Exam BP 130/70 mmHg  Pulse 75  Temp(Src) 98.3 F (36.8 C) (Oral)  Resp 20  Wt 236 lb (107.049 kg)  SpO2 94% VS noted,  Constitutional: Pt appears in no apparent distress HENT: Head: NCAT.  Right Ear: External ear normal.  Left Ear: External ear normal.  Eyes: . Pupils are equal, round, and reactive to light. Conjunctivae and EOM are normal Neck: Normal range of motion. Neck supple.  Cardiovascular: Normal rate and regular rhythm.   Pulmonary/Chest: Effort normal and breath sounds without rales or wheezing.  Neurological: Pt is alert. Not confused , motor grossly intact Skin: Skin is warm. No rash, no LE edema Psychiatric: Pt behavior is normal. No agitation.     Assessment & Plan:

## 2015-05-05 NOTE — Assessment & Plan Note (Signed)
stable overall by history and exam, recent data reviewed with pt, and pt to continue medical treatment as before,  to f/u any worsening symptoms or concerns BP Readings from Last 3 Encounters:  05/04/15 130/70  03/05/15 146/82  02/16/15 140/90

## 2015-05-05 NOTE — Assessment & Plan Note (Signed)
OK for preventive tamiflu - 75 qd x 10 days,  to f/u any worsening symptoms or concerns

## 2015-05-13 DIAGNOSIS — M79674 Pain in right toe(s): Secondary | ICD-10-CM | POA: Diagnosis not present

## 2015-05-31 ENCOUNTER — Other Ambulatory Visit: Payer: Self-pay | Admitting: Internal Medicine

## 2015-06-03 ENCOUNTER — Ambulatory Visit (INDEPENDENT_AMBULATORY_CARE_PROVIDER_SITE_OTHER): Payer: Medicare Other | Admitting: Internal Medicine

## 2015-06-03 ENCOUNTER — Encounter: Payer: Self-pay | Admitting: Internal Medicine

## 2015-06-03 VITALS — BP 154/90 | HR 75 | Temp 98.0°F | Resp 16 | Wt 238.0 lb

## 2015-06-03 DIAGNOSIS — I5022 Chronic systolic (congestive) heart failure: Secondary | ICD-10-CM | POA: Diagnosis not present

## 2015-06-03 DIAGNOSIS — I10 Essential (primary) hypertension: Secondary | ICD-10-CM

## 2015-06-03 DIAGNOSIS — R7303 Prediabetes: Secondary | ICD-10-CM

## 2015-06-03 DIAGNOSIS — M10171 Lead-induced gout, right ankle and foot: Secondary | ICD-10-CM

## 2015-06-03 DIAGNOSIS — E7800 Pure hypercholesterolemia, unspecified: Secondary | ICD-10-CM | POA: Diagnosis not present

## 2015-06-03 DIAGNOSIS — F419 Anxiety disorder, unspecified: Secondary | ICD-10-CM

## 2015-06-03 DIAGNOSIS — T560X1A Toxic effect of lead and its compounds, accidental (unintentional), initial encounter: Secondary | ICD-10-CM

## 2015-06-03 DIAGNOSIS — I482 Chronic atrial fibrillation, unspecified: Secondary | ICD-10-CM

## 2015-06-03 DIAGNOSIS — M109 Gout, unspecified: Secondary | ICD-10-CM | POA: Insufficient documentation

## 2015-06-03 DIAGNOSIS — Z8739 Personal history of other diseases of the musculoskeletal system and connective tissue: Secondary | ICD-10-CM | POA: Insufficient documentation

## 2015-06-03 MED ORDER — PREDNISONE 10 MG PO TABS: ORAL_TABLET | ORAL | Status: DC

## 2015-06-03 MED ORDER — AMLODIPINE BESYLATE 5 MG PO TABS: 5.0000 mg | ORAL_TABLET | Freq: Every day | ORAL | Status: DC

## 2015-06-03 NOTE — Progress Notes (Signed)
Pre visit review using our clinic review tool, if applicable. No additional management support is needed unless otherwise documented below in the visit note. 

## 2015-06-03 NOTE — Progress Notes (Signed)
Subjective:    Patient ID: Samuel Moyer, male    DOB: [birth year], 75 y.o.   MRN: [id number]  HPI He is here for follow up.  Afib, CHF, Hypertension: He is taking his medication daily. He is compliant with a low sodium diet.  He denies chest pain, palpitations, edema, shortness of breath and regular headaches. He is active, but not exercising regularly.  He does monitor his blood pressure at home, 135/75 on average.    Hyperlipidemia: He is taking his medication daily. He is compliant with a low fat/cholesterol diet. He is active, but not exercising regularly. He denies myalgias.   Anxiety: He is taking his medication daily as prescribed. He denies any side effects from the medication. He feels his anxiety is well controlled and he is happy with his current dose of medication.   Prediabetes:  He checks his sugars about once or twice a week.  He is not exercising much, but is active.   Gout:  He just had a gout attack.  He saw ortho and was placed on 7 days of prednisone.  His symptoms improved and was almost gone.  It has come back and is not as bad.   Medications and allergies reviewed with patient and updated if appropriate.  Patient Active Problem List   Diagnosis Date Noted  . Prediabetes 03/05/2015  . Hyperglycemia 03/05/2015  . Obesity 12/28/2014  . Edema 04/07/2014  . Venous (peripheral) insufficiency 02/16/2014  . Erysipelas of lower extremity 02/02/2014  . OSA (obstructive sleep apnea) 05/24/2010  . ATRIAL FIBRILLATION  01/26/2010  . Chronic systolic heart failure ([place]) 01/26/2010  . UNSPECIFIED CARDIAC DYSRHYTHMIA 01/14/2010  . 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 Hypercholesterolemia 10/21/2007  . Anxiety 10/21/2007  . Essential hypertension 02/26/2006  . COLONIC POLYPS, HX OF 02/26/2006    Current Outpatient Prescriptions on File Prior to Visit  Medication Sig Dispense Refill  . atorvastatin (LIPITOR) 10 MG tablet TAKE 1 TABLET DAILY 90 tablet 2  . benazepril  (LOTENSIN) 40 MG tablet TAKE 1 TABLET DAILY 90 tablet 1  . dabigatran (PRADAXA) 150 MG CAPS capsule TAKE 1 CAPSULE EVERY 12 HOURS 180 capsule 3  . furosemide (LASIX) 40 MG tablet Take 1 tablet (40 mg total) by mouth daily. 90 tablet 3  . glucose blood (ONE TOUCH ULTRA TEST) test strip Check blood sugar daily as directed 100 each 0  . hydrochlorothiazide (MICROZIDE) 12.5 MG capsule TAKE 1 CAPSULE DAILY 90 capsule 1  . metoprolol tartrate (LOPRESSOR) 25 MG tablet Take 2 tablets (50 mg total) by mouth 2 (two) times daily. 360 tablet 3  . Multiple Vitamin (MULTIVITAMIN) tablet Take 1 tablet by mouth daily.      [name] DELICA LANCETS 99991111 MISC 1 each by Other route daily. Check blood sugar daily as directed 100 each 0  . PARoxetine (PAXIL) 20 MG tablet Take 0.5 tablets (10 mg total) by mouth daily. 45 tablet 3   No current facility-administered medications on file prior to visit.    Past Medical History  Diagnosis Date  . Anxiety   . Claustrophobia     Occasionally when flying   . Hypertension   . Hyperlipidemia   . OSA (obstructive sleep apnea)     CPAP machine   . Atrial fibrillation ([place])   . Colitis   . Hemorrhoids   . Diabetes mellitus, type 2 ([place])   . PVC's (premature ventricular contractions)   . LV dysfunction     EF 40-45%  .  CHF (congestive heart failure) ([place])   . A-fib [facility])     Past Surgical History  Procedure Laterality Date  . US echocardiography  11/15/2009    EF 40-45%  . Cardiovascular stress test  03/02/2010    EF 50%    Social History   Social History  . Marital Status: Married    Spouse Name: [name]  . Number of Children: 3  . Years of Education: [name]   Occupational History  . Retired     [employer]    Social History Main Topics  . Smoking status: Former Smoker -- 1.00 packs/day for 16 years    Types: Cigarettes    Quit date: 06/30/1977  . Smokeless tobacco: Never Used  . Alcohol Use: No  . Drug Use: No  . Sexual Activity: Not Asked    Other Topics Concern  . None   Social History Narrative   2 caffeine drinks daily     Family History  Problem Relation Age of Onset  . Hypertension Father   . Heart attack Father     Age 50 (MI)  . Heart failure Father   . Colonic polyp Sister     and Father  . Colon cancer Neg Hx   . Stomach cancer Neg Hx   . Stroke Mother     Review of Systems  Constitutional: Negative for fever.  HENT: Positive for rhinorrhea (in morning).   Respiratory: Negative for cough, shortness of breath and wheezing.   Cardiovascular: Positive for leg swelling (left ankle only). Negative for chest pain and palpitations.  Musculoskeletal: Positive for arthralgias.  Neurological: Negative for dizziness, light-headedness and headaches.       Objective:   Filed Vitals:   06/03/15 0958  BP: 154/90  Pulse: 75  Temp: 98 F (36.7 C)  Resp: 16   Filed Weights   06/03/15 0958  Weight: 238 lb (107.956 kg)   Body mass index is 36.2 kg/(m^2).   Physical Exam Constitutional: Appears well-developed and well-nourished. No distress.  Neck: Neck supple. No tracheal deviation present. No thyromegaly present.  No carotid bruit. No cervical adenopathy.   Cardiovascular: Normal rate, regular rhythm and normal heart sounds.   No murmur heard.  Mild edema left lower extremity - chronic from old injury, no edema RLE, chronic skin changes in b/l LE Pulmonary/Chest: Effort normal and breath sounds normal. No respiratory distress. No wheezes.  Ext: right MCP joint with redness and swelling, decreased ROM and tenderness to palpation     Assessment & Plan:   See Problem List for Assessment and Plan of chronic medical problems.  F/u in 6 months

## 2015-06-03 NOTE — Assessment & Plan Note (Signed)
No evidence of fluid overload Continue current dose of Lasix

## 2015-06-03 NOTE — Assessment & Plan Note (Signed)
Taking atorvastatin 10 mg daily-we'll continue Will check lipid panel in 6 months

## 2015-06-03 NOTE — Assessment & Plan Note (Signed)
Controlled, stable Continue current dose of medication  

## 2015-06-03 NOTE — Assessment & Plan Note (Signed)
Blood pressure slightly elevated here today. At home seems to be controlled Currently on hydrochlorothiazide and Lasix-we will discontinue hydrochlorothiazide Continue current dose of metoprolol and benazepril Start amlodipine 5 mg daily-he did have swelling when he was on 10 mg daily, but hopefully we'll be able to tolerate milligrams a day Continue to monitor blood pressure at home

## 2015-06-03 NOTE — Patient Instructions (Signed)
   Medications reviewed and updated.  Changes include adding prednisone for your gout.  We also discontinued the hydrochlorothiazide and started amlodipine 5 mg daily.  Monitor your blood pressure at home - it should ideally be less than 140/90 on average.  Your prescription(s) have been submitted to your pharmacy. Please take as directed and contact our office if you believe you are having problem(s) with the medication(s).    Please followup in 6 months

## 2015-06-03 NOTE — Assessment & Plan Note (Signed)
Rate controlled Asymptomatic Continue current dose of metoprolol

## 2015-06-03 NOTE — Assessment & Plan Note (Signed)
A1c has been stable Continue regular activity Continue to monitor sugars at home periodically Will recheck A1c at his next visit

## 2015-06-03 NOTE — Assessment & Plan Note (Signed)
His symptoms in his right toe are consistent with gout. Recently treated with a week taper of prednisone by orthopedics-symptoms improved, but not completely resolved We'll treat with another prednisone taper He typically gets one attack every 2 years so we will not consider preventative medication at this time

## 2015-06-11 ENCOUNTER — Telehealth: Payer: Self-pay | Admitting: Pulmonary Disease

## 2015-06-11 NOTE — Telephone Encounter (Signed)
LVM for pt to return call

## 2015-06-14 ENCOUNTER — Encounter: Payer: Self-pay | Admitting: Pulmonary Disease

## 2015-06-14 ENCOUNTER — Ambulatory Visit (INDEPENDENT_AMBULATORY_CARE_PROVIDER_SITE_OTHER): Payer: Medicare Other | Admitting: Pulmonary Disease

## 2015-06-14 VITALS — BP 110/70 | HR 69 | Ht 68.0 in | Wt 227.8 lb

## 2015-06-14 DIAGNOSIS — G4733 Obstructive sleep apnea (adult) (pediatric): Secondary | ICD-10-CM

## 2015-06-14 DIAGNOSIS — J4520 Mild intermittent asthma, uncomplicated: Secondary | ICD-10-CM

## 2015-06-14 NOTE — Assessment & Plan Note (Addendum)
Your CPAP is set at 14 cm Few residual events are present but since he does not have excessive symptoms of somnolence or tiredness, I will not try to correct this further. I'm afraid that increasing the pressure may lead to discomfort and decreased compliance overall CPAP supplies will be renewed for a year  Weight loss encouraged, compliance with goal of at least 4-6 hrs every night is the expectation. Advised against medications with sedative side effects Cautioned against driving when sleepy - understanding that sleepiness will vary on a day to day basis

## 2015-06-14 NOTE — Assessment & Plan Note (Signed)
Stable, resolved. 

## 2015-06-14 NOTE — Progress Notes (Signed)
   Subjective:    Patient ID: Samuel Moyer, male    DOB: 1940-03-13, 75 y.o.   MRN: [id number]  HPI  75 yo remote smoker (quit '79) retired [employer] worker for FU of obstructive sleep apnea  He has chronic atrial fibrillation & LV dysfunction ,  06/14/2015  Chief Complaint  Patient presents with  . Follow-up    doing well on cpap machine, has head cold, sinus pressure, sinus drainage.   He has the same machine since 2012 He is to change DME recently No problems with getting CPAP supplies He denies excessive daytime somnolence or tiredness He had a mask leak which resolved after changing his mask Download was reviewed and this shows excellent compliance on 14 cm with residue AHI of 15/hour-all his prior downloads half showed similar residual events  Significant tests/ events   EF 40-45%, failed cardioversion in dec'11.  PSG 04/06/10 (wt 200) showed severe obstructive sleep apnea with AHI 66/h, nadir desatn 75% corrrected by CPAP 10 cm to AHI 4.5/h. Higher pressures were associated with emergence of central events.  Download 4/30 -07/18/10 >>excellent usage 7h, residual AI 11/h  03/2011 Download shows good compliance, residual ahi 11/h  Download 03/2012 >> residual AHI 15/h on cpap 10 cm, good usage Download 03/2013 - AHI 21/h on 10 cm,EPR 3, good usage    12/28/2014 Follow up : OSA  Patient returns for a four-month follow-up for severe sleep apnea He remains on nocturnal C Pap. Patient feels that is helping and he is rested without any significant daytime sleepiness. Download from October 23 through November 22 shows excellent compliance with average use it, ad 7 hours. He is on a set pressure of 14 cm H2O. AHI 18.7. (this is down since pressure increased ) .  No mask issues . Gets regular mask and tubing changes.  Denies chest pain , orthopnea or increased edema.  Review of Systems Patient denies significant dyspnea,cough, hemoptysis,  chest pain, palpitations, pedal edema,  orthopnea, paroxysmal nocturnal dyspnea, lightheadedness, nausea, vomiting, abdominal or  leg pains      Objective:   Physical Exam  Gen. Pleasant, obese, in no distress ENT - no lesions, no post nasal drip Neck: No JVD, no thyromegaly, no carotid bruits Lungs: no use of accessory muscles, no dullness to percussion, decreased without rales or rhonchi  Cardiovascular: Rhythm regular, heart sounds  normal, no murmurs or gallops, no peripheral edema Musculoskeletal: No deformities, no cyanosis or clubbing , no tremors        Assessment & Plan:

## 2015-06-14 NOTE — Patient Instructions (Signed)
Your CPAP is set at 14 cm Few residual events are present CPAP supplies will be renewed for a year

## 2015-06-15 ENCOUNTER — Telehealth: Payer: Self-pay | Admitting: Pulmonary Disease

## 2015-06-15 DIAGNOSIS — G4733 Obstructive sleep apnea (adult) (pediatric): Secondary | ICD-10-CM

## 2015-06-15 NOTE — Telephone Encounter (Signed)
Pt was seen yesterday, all questions answered at that time.  Nothing further needed.

## 2015-06-15 NOTE — Telephone Encounter (Signed)
Order placed

## 2015-06-17 ENCOUNTER — Encounter: Payer: Self-pay | Admitting: Pulmonary Disease

## 2015-06-23 ENCOUNTER — Ambulatory Visit (INDEPENDENT_AMBULATORY_CARE_PROVIDER_SITE_OTHER): Payer: Medicare Other

## 2015-06-23 ENCOUNTER — Ambulatory Visit (INDEPENDENT_AMBULATORY_CARE_PROVIDER_SITE_OTHER): Payer: Medicare Other | Admitting: Physician Assistant

## 2015-06-23 VITALS — BP 140/82 | HR 72 | Temp 97.8°F | Resp 16 | Ht 68.0 in | Wt 234.0 lb

## 2015-06-23 DIAGNOSIS — M7989 Other specified soft tissue disorders: Secondary | ICD-10-CM | POA: Diagnosis not present

## 2015-06-23 DIAGNOSIS — M25571 Pain in right ankle and joints of right foot: Secondary | ICD-10-CM

## 2015-06-23 NOTE — Patient Instructions (Signed)
Wear the ankle brace for the next few weeks during physical activity.  It is okay to take tylenol as written on the bottle.  20 minutes of ice may also help the ankle feel better.

## 2015-06-23 NOTE — Progress Notes (Signed)
06/23/2015 12:06 PM   DOB: 03-31-1940 / MRN: [id number]  SUBJECTIVE:  Samuel Moyer is a 75 y.o. male presenting for ankle pain after twisting his ankle one month ago.  Reports he has had two episodes of gout since that time and now his ankle is hurting and swollen again.  States the initial injury one month ago was an inversion injury.  He felt this got better, but is not worse.  He denies skin tenderness and rash about the ankle.  Reports that his gout always affects the first MTP.   He has No Known Allergies.   He  has a past medical history of Anxiety; Claustrophobia; Hypertension; Hyperlipidemia; OSA (obstructive sleep apnea); Atrial fibrillation ([place]); Colitis; Hemorrhoids; Diabetes mellitus, type 2 ([place]); PVC's (premature ventricular contractions); LV dysfunction; CHF (congestive heart failure) ([place]); A-fib ([place]); and Gout.    He  reports that he quit smoking about 38 years ago. His smoking use included Cigarettes. He has a 16 pack-year smoking history. He has never used smokeless tobacco. He reports that he does not drink alcohol or use illicit drugs. He  has no sexual activity history on file. The patient  has past surgical history that includes US ECHOCARDIOGRAPHY (11/15/2009) and Cardiovascular stress test (03/02/2010).  His family history includes Colonic polyp in his sister; Heart attack in his father; Heart failure in his father; Hypertension in his father; Stroke in his mother. There is no history of Colon cancer or Stomach cancer.  Review of Systems  Constitutional: Negative for fever.  Cardiovascular: Negative for PND.  Musculoskeletal: Positive for joint pain. Negative for myalgias and falls.  Skin: Negative for rash.  Neurological: Negative for headaches.    Problem list and medications reviewed and updated by myself where necessary, and exist elsewhere in the encounter.   OBJECTIVE:  BP 140/82 mmHg  Pulse 72  Temp(Src) 97.8 F (36.6 C) (Oral)  Resp 16  Ht 5\' 8"  (1.727  m)  Wt 234 lb (106.142 kg)  BMI 35.59 kg/m2  SpO2 98%  Physical Exam  Constitutional: He is oriented to person, place, and time. He appears well-developed and well-nourished. No distress.  Cardiovascular: Normal rate and regular rhythm.   Pulmonary/Chest: Effort normal and breath sounds normal.  Musculoskeletal:       Feet:  Neurological: He is alert and oriented to person, place, and time. No cranial nerve deficit.  Skin: Skin is warm. No rash noted. He is not diaphoretic. No erythema.    No results found for this or any previous visit (from the past 72 hour(s)).  Dg Ankle Complete Right  06/23/2015  CLINICAL DATA:  Ankle swelling and tenderness especially laterally. EXAM: RIGHT ANKLE - COMPLETE 3+ VIEW COMPARISON:  None in PACs FINDINGS: The bones are adequately mineralized. There is no acute fracture nor dislocation. There is a spur arising from the inferior articular margin of the medial malleolus. A tiny spur arising from the tip of the lateral malleolus is suspected as well. The ankle joint mortise is preserved. The talar dome is intact. There is a small plantar calcaneal spur. The observed portions of the metatarsal bases are normal. IMPRESSION: There is no acute or significant chronic bony abnormality of the right ankle. There is diffuse soft tissue swelling. Electronically Signed   By: David  Martinique M.D.   On: 06/23/2015 12:01    ASSESSMENT AND PLAN  Samuel Moyer was seen today for ankle pain.  Diagnoses and all orders for this visit:  Pain in joint, ankle and  foot, right: Ankle negative for fracture. Given that he sprained his ankle roughly 1 month ago he may have inflamed the old injury with ADLs.  Will get him a sweedo and guidance provided via avs.   -     DG Ankle Complete Right; Future    The patient was advised to call or return to clinic if he does not see an improvement in symptoms or to seek the care of the closest emergency department if he worsens with the above plan.    Philis Fendt, MHS, PA-C [facility] 06/23/2015 12:06 PM

## 2015-06-25 DIAGNOSIS — M25579 Pain in unspecified ankle and joints of unspecified foot: Secondary | ICD-10-CM | POA: Diagnosis not present

## 2015-06-25 DIAGNOSIS — M25571 Pain in right ankle and joints of right foot: Secondary | ICD-10-CM | POA: Diagnosis not present

## 2015-07-02 DIAGNOSIS — M25571 Pain in right ankle and joints of right foot: Secondary | ICD-10-CM | POA: Diagnosis not present

## 2015-07-28 ENCOUNTER — Other Ambulatory Visit: Payer: Self-pay | Admitting: Internal Medicine

## 2015-09-06 ENCOUNTER — Encounter: Payer: Self-pay | Admitting: *Deleted

## 2015-09-20 ENCOUNTER — Other Ambulatory Visit: Payer: Self-pay | Admitting: Internal Medicine

## 2015-09-20 ENCOUNTER — Ambulatory Visit (INDEPENDENT_AMBULATORY_CARE_PROVIDER_SITE_OTHER): Payer: Medicare Other | Admitting: Cardiology

## 2015-09-20 ENCOUNTER — Encounter: Payer: Self-pay | Admitting: Cardiology

## 2015-09-20 VITALS — BP 148/90 | HR 65 | Ht 68.0 in | Wt 231.1 lb

## 2015-09-20 DIAGNOSIS — I5022 Chronic systolic (congestive) heart failure: Secondary | ICD-10-CM | POA: Diagnosis not present

## 2015-09-20 DIAGNOSIS — I482 Chronic atrial fibrillation, unspecified: Secondary | ICD-10-CM

## 2015-09-20 DIAGNOSIS — I10 Essential (primary) hypertension: Secondary | ICD-10-CM | POA: Diagnosis not present

## 2015-09-20 DIAGNOSIS — E7800 Pure hypercholesterolemia, unspecified: Secondary | ICD-10-CM | POA: Diagnosis not present

## 2015-09-20 NOTE — Patient Instructions (Signed)
Continue your current therapy  I will see you in 6 months.   

## 2015-09-20 NOTE — Progress Notes (Signed)
Samuel Moyer Date of Birth: [birth year]   History of Present Illness: Samuel Moyer is seen today for followup of CHF and atrial fibrillation. He has a history of permanent atrial fibrillation. He also is a history of congestive heart failure with ejection fraction of 40-45%. Normal myoview in 2011 with EF of 50% at that time. He does have OSA on CPAP and is followed by pulmonary.  On followup today he reports he is doing well. He denies any SOB, chest pain, or palpitations. No dizziness or fatigue.  He reports BP at home has been doing well.He is unaware of his Afib. He does have some sinus drainage with a cough in the early am. When seen by primary care BP was not optimal and his HCTZ was stopped and he was started on amlodipine 5 mg daily. Since then he has noted more swelling. He is still on lasix 40 mg daily. He has been more careful with salt intake.   Current Outpatient Prescriptions on File Prior to Visit  Medication Sig Dispense Refill  . amLODipine (NORVASC) 5 MG tablet Take 1 tablet (5 mg total) by mouth daily. 90 tablet 3  . atorvastatin (LIPITOR) 10 MG tablet TAKE 1 TABLET DAILY 90 tablet 2  . benazepril (LOTENSIN) 40 MG tablet TAKE 1 TABLET DAILY 90 tablet 0  . dabigatran (PRADAXA) 150 MG CAPS capsule TAKE 1 CAPSULE EVERY 12 HOURS 180 capsule 3  . furosemide (LASIX) 40 MG tablet Take 1 tablet (40 mg total) by mouth daily. 90 tablet 3  . glucose blood (ONE TOUCH ULTRA TEST) test strip Check blood sugar daily as directed 100 each 0  . metoprolol tartrate (LOPRESSOR) 25 MG tablet Take 2 tablets (50 mg total) by mouth 2 (two) times daily. 360 tablet 3  . Multiple Vitamin (MULTIVITAMIN) tablet Take 1 tablet by mouth daily.      [name] DELICA LANCETS 99991111 MISC 1 each by Other route daily. Check blood sugar daily as directed 100 each 0   No current facility-administered medications on file prior to visit.     No Known Allergies  Past Medical History:  Diagnosis Date  . A-fib ([place])    . Anxiety   . Atrial fibrillation ([place])   . CHF (congestive heart failure) ([place])   . Claustrophobia    Occasionally when flying   . Colitis   . Diabetes mellitus, type 2 ([place])   . Gout   . Hemorrhoids   . Hyperlipidemia   . Hypertension   . LV dysfunction    EF 40-45%  . OSA (obstructive sleep apnea)    CPAP machine   . PVC's (premature ventricular contractions)     Past Surgical History:  Procedure Laterality Date  . CARDIOVASCULAR STRESS TEST  03/02/2010   EF 50%  . US ECHOCARDIOGRAPHY  11/15/2009   EF 40-45%    History  Smoking Status  . Former Smoker  . Packs/day: 1.00  . Years: 16.00  . Types: Cigarettes  . Quit date: 06/30/1977  Smokeless Tobacco  . Never Used    History  Alcohol Use No    Family History  Problem Relation Age of Onset  . Hypertension Father   . Heart attack Father     Age 42 (MI)  . Heart failure Father   . Colonic polyp Sister     and Father  . Stroke Mother   . Colon cancer Neg Hx   . Stomach cancer Neg Hx     Review  of Systems: As noted in history of present illness  All other systems were reviewed and are negative.  Physical Exam: BP (!) 148/90   Pulse 65   Ht 5\' 8"  (1.727 m)   Wt 231 lb 1.9 oz (104.8 kg)   BMI 35.14 kg/m  The patient is alert and oriented x 3.   The skin is warm and dry.    The HEENT exam  is normal.  The carotids are 2+ without bruits.  There is no thyromegaly.  There is no JVD.  The lungs are clear.   The heart exam reveals  an irregular  rate with a normal S1 and S2.  There are no murmurs, gallops, or rubs.  The PMI is not displaced.   Abdominal exam reveals good bowel sounds. Exam of the legs reveals  1+pretibial edema L>R.   The distal pulses are intact.  Cranial nerves II - XII are intact.  Motor and sensory functions are intact.  The gait is normal.  LABORATORY DATA: Lab Results  Component Value Date   WBC 11.6 (H) 12/17/2013   HGB 14.2 12/17/2013   HCT 42.1 12/17/2013   PLT 290 12/17/2013    GLUCOSE 130 (H) 03/11/2015   CHOL 158 03/11/2015   TRIG 69.0 03/11/2015   HDL 50.80 03/11/2015   LDLCALC 94 03/11/2015   ALT 15 03/11/2015   AST 19 03/11/2015   NA 140 03/11/2015   K 4.2 03/11/2015   CL 102 03/11/2015   CREATININE 1.19 03/11/2015   BUN 18 03/11/2015   CO2 33 (H) 03/11/2015   TSH 1.30 06/13/2013   PSA 0.63 09/12/2007   HGBA1C 6.2 03/11/2015   MICROALBUR 1.8 06/13/2013   Ecg today shows Afib with rate 65. RBBB. Nonspecific TWA. I have personally reviewed and interpreted this study.  Assessment / Plan: 1. Permanent atrial fibrillation. Rate is well controlled.  Continue metoprolol.  He is on chronic anticoagulation with Pradaxa. Renal function has been normal.  2. Congestive heart failure with chronic systolic dysfunction. Ejection fraction is mildly reduced. Continue ACE inhibitor, beta blocker, and diuretic therapy.  Follow up in 6 months.  3. Hypertension, BP is well controlled according to home records. Some increased edema related to amlodipine. Will monitor for now.

## 2015-10-26 ENCOUNTER — Other Ambulatory Visit: Payer: Self-pay | Admitting: Internal Medicine

## 2015-11-18 DIAGNOSIS — Z23 Encounter for immunization: Secondary | ICD-10-CM | POA: Diagnosis not present

## 2015-12-03 ENCOUNTER — Encounter: Payer: Self-pay | Admitting: Internal Medicine

## 2015-12-03 ENCOUNTER — Ambulatory Visit (INDEPENDENT_AMBULATORY_CARE_PROVIDER_SITE_OTHER): Payer: Medicare Other | Admitting: Internal Medicine

## 2015-12-03 VITALS — BP 160/98 | HR 65 | Wt 229.0 lb

## 2015-12-03 DIAGNOSIS — I10 Essential (primary) hypertension: Secondary | ICD-10-CM

## 2015-12-03 DIAGNOSIS — E7800 Pure hypercholesterolemia, unspecified: Secondary | ICD-10-CM

## 2015-12-03 DIAGNOSIS — F419 Anxiety disorder, unspecified: Secondary | ICD-10-CM | POA: Diagnosis not present

## 2015-12-03 DIAGNOSIS — R7303 Prediabetes: Secondary | ICD-10-CM | POA: Diagnosis not present

## 2015-12-03 DIAGNOSIS — Z23 Encounter for immunization: Secondary | ICD-10-CM | POA: Diagnosis not present

## 2015-12-03 DIAGNOSIS — L600 Ingrowing nail: Secondary | ICD-10-CM

## 2015-12-03 NOTE — Assessment & Plan Note (Signed)
Sugars in 120's at home Check a1c Stressed low sugar/carb diet and regular exercise

## 2015-12-03 NOTE — Assessment & Plan Note (Signed)
BP high today Continue metoprolol 50 mg BID Start amlodipine - he has had swelling in the past from it but we will try it at 5 mg daily and see if he tolerate it Continue lasix 40 mg daily Continue benazepril 40 mg daily

## 2015-12-03 NOTE — Progress Notes (Signed)
Subjective:    Patient ID: Samuel Moyer, male    DOB: 18-May-1940, 75 y.o.   MRN: [id number]  HPI The patient is here for follow up.  Hypertension: He is taking his medication daily. He is compliant with a low sodium diet.  He denies chest pain, palpitations, edema, shortness of breath and regular headaches. He is exercising regularly.  He does not monitor his blood pressure at home.    Hyperlipidemia: He is taking his medication daily. He is compliant with a low fat/cholesterol diet. He is exercising regularly - walking. He denies myalgias.   Prediabetes:  He is compliant with a low sugar/carbohydrate diet.  He is exercising regularly - walking.  He checks his sugar once a week and it is on average 123.  Anxiety: He is taking his medication daily as prescribed. He denies any side effects from the medication. He feels his anxiety is well controlled and he is happy with his current dose of medication.   Medications and allergies reviewed with patient and updated if appropriate.  Patient Active Problem List   Diagnosis Date Noted  . Gout attack 06/03/2015  . Prediabetes 03/05/2015  . Obesity 12/28/2014  . Edema 04/07/2014  . Venous (peripheral) insufficiency 02/16/2014  . Erysipelas of lower extremity 02/02/2014  . OSA (obstructive sleep apnea) 05/24/2010  . ATRIAL FIBRILLATION  01/26/2010  . Chronic systolic heart failure ([place]) 01/26/2010  . Asthma 01/14/2010  . Hypercholesterolemia 10/21/2007  . Anxiety 10/21/2007  . Essential hypertension 02/26/2006  . COLONIC POLYPS, HX OF 02/26/2006    Current Outpatient Prescriptions on File Prior to Visit  Medication Sig Dispense Refill  . amLODipine (NORVASC) 5 MG tablet Take 1 tablet (5 mg total) by mouth daily. 90 tablet 3  . atorvastatin (LIPITOR) 10 MG tablet TAKE 1 TABLET DAILY 90 tablet 2  . benazepril (LOTENSIN) 40 MG tablet TAKE 1 TABLET DAILY 90 tablet 2  . dabigatran (PRADAXA) 150 MG CAPS capsule TAKE 1 CAPSULE EVERY 12 HOURS  180 capsule 3  . furosemide (LASIX) 40 MG tablet Take 1 tablet (40 mg total) by mouth daily. 90 tablet 3  . glucose blood (ONE TOUCH ULTRA TEST) test strip Check blood sugar daily as directed 100 each 0  . metoprolol tartrate (LOPRESSOR) 25 MG tablet Take 2 tablets (50 mg total) by mouth 2 (two) times daily. 360 tablet 3  . Multiple Vitamin (MULTIVITAMIN) tablet Take 1 tablet by mouth daily.      [name] DELICA LANCETS 99991111 MISC 1 each by Other route daily. Check blood sugar daily as directed 100 each 0  . PARoxetine (PAXIL) 20 MG tablet TAKE ONE-HALF (1/2) TABLET DAILY 45 tablet 2   No current facility-administered medications on file prior to visit.     Past Medical History:  Diagnosis Date  . A-fib ([place])   . Anxiety   . Atrial fibrillation ([place])   . CHF (congestive heart failure) ([place])   . Claustrophobia    Occasionally when flying   . Colitis   . Diabetes mellitus, type 2 ([place])   . Gout   . Hemorrhoids   . Hyperlipidemia   . Hypertension   . LV dysfunction    EF 40-45%  . OSA (obstructive sleep apnea)    CPAP machine   . PVC's (premature ventricular contractions)     Past Surgical History:  Procedure Laterality Date  . CARDIOVASCULAR STRESS TEST  03/02/2010   EF 50%  . US ECHOCARDIOGRAPHY  11/15/2009  EF 40-45%    Social History   Social History  . Marital status: Married    Spouse name: [name]  . Number of children: 3  . Years of education: [name]   Occupational History  . Retired Retired    [employer]    Social History Main Topics  . Smoking status: Former Smoker    Packs/day: 1.00    Years: 16.00    Types: Cigarettes    Quit date: 06/30/1977  . Smokeless tobacco: Never Used  . Alcohol use No  . Drug use: No  . Sexual activity: Not Asked   Other Topics Concern  . None   Social History Narrative   2 caffeine drinks daily     Family History  Problem Relation Age of Onset  . Hypertension Father   . Heart attack Father     Age 54 (MI)  . Heart  failure Father   . Colonic polyp Sister     and Father  . Stroke Mother   . Colon cancer Neg Hx   . Stomach cancer Neg Hx     Review of Systems  Constitutional: Negative for chills and fever.  Respiratory: Positive for wheezing (occ first thing in the morning). Negative for cough and shortness of breath.   Cardiovascular: Negative for chest pain, palpitations and leg swelling.  Gastrointestinal: Negative for abdominal pain.       Rare gerd  Neurological: Negative for light-headedness, numbness and headaches.       Objective:   Vitals:   12/03/15 1026  BP: (!) 158/100  Pulse: 65   Filed Weights   12/03/15 1026  Weight: 229 lb (103.9 kg)   Body mass index is 34.82 kg/m.   Physical Exam    Constitutional: Appears well-developed and well-nourished. No distress.  HENT:  Head: Normocephalic and atraumatic.  Neck: Neck supple. No tracheal deviation present. No thyromegaly present.  No cervical lymphadenopathy Cardiovascular: Normal rate, regular rhythm and normal heart sounds.   No murmur heard. No carotid bruit .  Mild b/l LE edema Pulmonary/Chest: Effort normal and breath sounds normal. No respiratory distress. No has no wheezes. No rales.  Skin: Skin is warm and dry. Not diaphoretic.  Psychiatric: Normal mood and affect. Behavior is normal.     Assessment & Plan:    See Problem List for Assessment and Plan of chronic medical problems.   F/u in 6 months

## 2015-12-03 NOTE — Assessment & Plan Note (Signed)
Controlled, stable Continue current dose of medication  

## 2015-12-03 NOTE — Patient Instructions (Addendum)
  Test(s) ordered today. Your results will be released to [place] (or called to you) after review, usually within 72hours after test completion. If any changes need to be made, you will be notified at that same time.  All other Health Maintenance issues reviewed.   All recommended immunizations and age-appropriate screenings are up-to-date or discussed.  Tetanus  immunization administered today.   Medications reviewed and updated.  No changes recommended at this time.  Your prescription(s) have been submitted to your pharmacy. Please take as directed and contact our office if you believe you are having problem(s) with the medication(s).  A referral for podiatry was ordered.  Please followup in 6 months

## 2015-12-03 NOTE — Assessment & Plan Note (Signed)
Referred to podiatry.

## 2015-12-03 NOTE — Assessment & Plan Note (Signed)
Check lipid panel  - has been well controlled Continue statin

## 2015-12-14 ENCOUNTER — Other Ambulatory Visit (INDEPENDENT_AMBULATORY_CARE_PROVIDER_SITE_OTHER): Payer: Medicare Other

## 2015-12-14 DIAGNOSIS — E7800 Pure hypercholesterolemia, unspecified: Secondary | ICD-10-CM | POA: Diagnosis not present

## 2015-12-14 DIAGNOSIS — I10 Essential (primary) hypertension: Secondary | ICD-10-CM | POA: Diagnosis not present

## 2015-12-14 DIAGNOSIS — R7303 Prediabetes: Secondary | ICD-10-CM | POA: Diagnosis not present

## 2015-12-14 LAB — COMPREHENSIVE METABOLIC PANEL
ALT: 11 U/L (ref 0–53)
AST: 16 U/L (ref 0–37)
Albumin: 4.2 g/dL (ref 3.5–5.2)
Alkaline Phosphatase: 67 U/L (ref 39–117)
BILIRUBIN TOTAL: 0.8 mg/dL (ref 0.2–1.2)
BUN: 13 mg/dL (ref 6–23)
CALCIUM: 9.4 mg/dL (ref 8.4–10.5)
CHLORIDE: 104 meq/L (ref 96–112)
CO2: 30 meq/L (ref 19–32)
CREATININE: 1.04 mg/dL (ref 0.40–1.50)
GFR: 73.74 mL/min (ref >60.00)
GLUCOSE: 112 mg/dL — AB (ref 70–99)
Potassium: 3.7 mEq/L (ref 3.5–5.1)
Sodium: 142 mEq/L (ref 135–145)
Total Protein: 7.2 g/dL (ref 6.0–8.3)

## 2015-12-14 LAB — LIPID PANEL
CHOL/HDL RATIO: 3
CHOLESTEROL: 159 mg/dL (ref 0–200)
HDL: 54.1 mg/dL (ref >39.00)
LDL CALC: 90 mg/dL (ref 0–99)
NonHDL: 104.53
TRIGLYCERIDES: 75 mg/dL (ref 0.0–149.0)
VLDL: 15 mg/dL (ref 0.0–40.0)

## 2015-12-14 LAB — HEMOGLOBIN A1C: Hgb A1c MFr Bld: 6 % (ref 4.6–6.5)

## 2015-12-15 ENCOUNTER — Ambulatory Visit (INDEPENDENT_AMBULATORY_CARE_PROVIDER_SITE_OTHER): Payer: Medicare Other | Admitting: Podiatry

## 2015-12-15 ENCOUNTER — Encounter: Payer: Self-pay | Admitting: Podiatry

## 2015-12-15 ENCOUNTER — Ambulatory Visit (INDEPENDENT_AMBULATORY_CARE_PROVIDER_SITE_OTHER): Payer: Medicare Other

## 2015-12-15 VITALS — Resp 16 | Ht 68.0 in | Wt 220.0 lb

## 2015-12-15 DIAGNOSIS — M79672 Pain in left foot: Secondary | ICD-10-CM

## 2015-12-15 DIAGNOSIS — M7662 Achilles tendinitis, left leg: Secondary | ICD-10-CM | POA: Diagnosis not present

## 2015-12-15 DIAGNOSIS — M722 Plantar fascial fibromatosis: Secondary | ICD-10-CM

## 2015-12-15 DIAGNOSIS — L600 Ingrowing nail: Secondary | ICD-10-CM | POA: Diagnosis not present

## 2015-12-15 NOTE — Progress Notes (Signed)
   Subjective:    Patient ID: Samuel Moyer, male    DOB: 08-15-1940, 75 y.o.   MRN: [id number]  HPI Chief Complaint  Patient presents with  . Foot Pain    Left foot; heel & lateral side; x1 week  . Nail Problem    Bilateral; great toes; Right foot-lateral side; Left foot-medial side; x6 months   Pt is pre-diabetic; A1C=6.0   Review of Systems  All other systems reviewed and are negative.      Objective:   Physical Exam        Assessment & Plan:

## 2015-12-15 NOTE — Patient Instructions (Addendum)
ANTIBACTERIAL SOAP INSTRUCTIONS  THE DAY AFTER PROCEDURE  Please follow the instructions your doctor has marked.   Shower as usual. Before getting out, place a drop of antibacterial liquid soap (Dial) on a wet, clean washcloth.  Gently wipe washcloth over affected area.  Afterward, rinse the area with warm water.  Blot the area dry with a soft cloth and cover with antibiotic ointment (neosporin, polysporin, bacitracin) and band aid or gauze and tape  Place 3-4 drops of antibacterial liquid soap in a quart of warm tap water.  Submerge foot into water for 20 minutes.  If bandage was applied after your procedure, leave on to allow for easy lift off, then remove and continue with soak for the remaining time.  Next, blot area dry with a soft cloth and cover with a bandage.  Apply other medications as directed by your doctor, such as cortisporin otic solution (eardrops) or neosporin antibiotic ointment  Soak toe/toes for approximately 1-2 weeks, allow affected toe to air dry periodically throughout the day when not wearing shoes. When wearing a shoe, apply neosporin as needed with a light bandage to protect the nail bed.  Leave original bandage ( this is the big blue bandage you leave the office wearing) in place for 24 hours if tolerable. Begin first soak after the 24 hour period. If severe pain or throbbing occurs you can begin your first soak before the 24 hour period. Submerge toe with bandages on into soapy water to allow bandages to loosen, then remove bandages. Allow area to air dry before dressing with a bandage.  Drainage for approximately 2-4 weeks is normal,  Discomfort to area is to be expected. Tylenol or ibuprofen can be taken as directed if tolerated.  Any severe pain, blistering, increased swelling or redness needs to be evaluated, please call the office with any questions or concerns      Plantar Fasciitis (Heel Spur Syndrome) with Rehab The plantar fascia is a fibrous,  ligament-like, soft-tissue structure that spans the bottom of the foot. Plantar fasciitis is a condition that causes pain in the foot due to inflammation of the tissue. SYMPTOMS   Pain and tenderness on the underneath side of the foot.  Pain that worsens with standing or walking. CAUSES  Plantar fasciitis is caused by irritation and injury to the plantar fascia on the underneath side of the foot. Common mechanisms of injury include:  Direct trauma to bottom of the foot.  Damage to a small nerve that runs under the foot where the main fascia attaches to the heel bone.  Stress placed on the plantar fascia due to bone spurs. RISK INCREASES WITH:   Activities that place stress on the plantar fascia (running, jumping, pivoting, or cutting).  Poor strength and flexibility.  Improperly fitted shoes.  Tight calf muscles.  Flat feet.  Failure to warm-up properly before activity.  Obesity. PREVENTION  Warm up and stretch properly before activity.  Allow for adequate recovery between workouts.  Maintain physical fitness:  Strength, flexibility, and endurance.  Cardiovascular fitness.  Maintain a health body weight.  Avoid stress on the plantar fascia.  Wear properly fitted shoes, including arch supports for individuals who have flat feet.  PROGNOSIS  If treated properly, then the symptoms of plantar fasciitis usually resolve without surgery. However, occasionally surgery is necessary.  RELATED COMPLICATIONS   Recurrent symptoms that may result in a chronic condition.  Problems of the lower back that are caused by compensating for the injury, such as  limping.  Pain or weakness of the foot during push-off following surgery.  Chronic inflammation, scarring, and partial or complete fascia tear, occurring more often from repeated injections.  TREATMENT  Treatment initially involves the use of ice and medication to help reduce pain and inflammation. The use of  strengthening and stretching exercises may help reduce pain with activity, especially stretches of the Achilles tendon. These exercises may be performed at home or with a therapist. Your caregiver may recommend that you use heel cups of arch supports to help reduce stress on the plantar fascia. Occasionally, corticosteroid injections are given to reduce inflammation. If symptoms persist for greater than 6 months despite non-surgical (conservative), then surgery may be recommended.   MEDICATION   If pain medication is necessary, then nonsteroidal anti-inflammatory medications, such as aspirin and ibuprofen, or other minor pain relievers, such as acetaminophen, are often recommended.  Do not take pain medication within 7 days before surgery.  Prescription pain relievers may be given if deemed necessary by your caregiver. Use only as directed and only as much as you need.  Corticosteroid injections may be given by your caregiver. These injections should be reserved for the most serious cases, because they may only be given a certain number of times.  HEAT AND COLD  Cold treatment (icing) relieves pain and reduces inflammation. Cold treatment should be applied for 10 to 15 minutes every 2 to 3 hours for inflammation and pain and immediately after any activity that aggravates your symptoms. Use ice packs or massage the area with a piece of ice (ice massage).  Heat treatment may be used prior to performing the stretching and strengthening activities prescribed by your caregiver, physical therapist, or athletic trainer. Use a heat pack or soak the injury in warm water.  SEEK IMMEDIATE MEDICAL CARE IF:  Treatment seems to offer no benefit, or the condition worsens.  Any medications produce adverse side effects.  EXERCISES- RANGE OF MOTION (ROM) AND STRETCHING EXERCISES - Plantar Fasciitis (Heel Spur Syndrome) These exercises may help you when beginning to rehabilitate your injury. Your symptoms may  resolve with or without further involvement from your physician, physical therapist or athletic trainer. While completing these exercises, remember:   Restoring tissue flexibility helps normal motion to return to the joints. This allows healthier, less painful movement and activity.  An effective stretch should be held for at least 30 seconds.  A stretch should never be painful. You should only feel a gentle lengthening or release in the stretched tissue.  RANGE OF MOTION - Toe Extension, Flexion  Sit with your right / left leg crossed over your opposite knee.  Grasp your toes and gently pull them back toward the top of your foot. You should feel a stretch on the bottom of your toes and/or foot.  Hold this stretch for 10 seconds.  Now, gently pull your toes toward the bottom of your foot. You should feel a stretch on the top of your toes and or foot.  Hold this stretch for 10 seconds. Repeat  times. Complete this stretch 3 times per day.   RANGE OF MOTION - Ankle Dorsiflexion, Active Assisted  Remove shoes and sit on a chair that is preferably not on a carpeted surface.  Place right / left foot under knee. Extend your opposite leg for support.  Keeping your heel down, slide your right / left foot back toward the chair until you feel a stretch at your ankle or calf. If you do not feel  a stretch, slide your bottom forward to the edge of the chair, while still keeping your heel down.  Hold this stretch for 10 seconds. Repeat 3 times. Complete this stretch 2 times per day.   STRETCH  Gastroc, Standing  Place hands on wall.  Extend right / left leg, keeping the front knee somewhat bent.  Slightly point your toes inward on your back foot.  Keeping your right / left heel on the floor and your knee straight, shift your weight toward the wall, not allowing your back to arch.  You should feel a gentle stretch in the right / left calf. Hold this position for 10 seconds. Repeat 3  times. Complete this stretch 2 times per day.  STRETCH  Soleus, Standing  Place hands on wall.  Extend right / left leg, keeping the other knee somewhat bent.  Slightly point your toes inward on your back foot.  Keep your right / left heel on the floor, bend your back knee, and slightly shift your weight over the back leg so that you feel a gentle stretch deep in your back calf.  Hold this position for 10 seconds. Repeat 3 times. Complete this stretch 2 times per day.  STRETCH  Gastrocsoleus, Standing  Note: This exercise can place a lot of stress on your foot and ankle. Please complete this exercise only if specifically instructed by your caregiver.   Place the ball of your right / left foot on a step, keeping your other foot firmly on the same step.  Hold on to the wall or a rail for balance.  Slowly lift your other foot, allowing your body weight to press your heel down over the edge of the step.  You should feel a stretch in your right / left calf.  Hold this position for 10 seconds.  Repeat this exercise with a slight bend in your right / left knee. Repeat 3 times. Complete this stretch 2 times per day.   STRENGTHENING EXERCISES - Plantar Fasciitis (Heel Spur Syndrome)  These exercises may help you when beginning to rehabilitate your injury. They may resolve your symptoms with or without further involvement from your physician, physical therapist or athletic trainer. While completing these exercises, remember:   Muscles can gain both the endurance and the strength needed for everyday activities through controlled exercises.  Complete these exercises as instructed by your physician, physical therapist or athletic trainer. Progress the resistance and repetitions only as guided.  STRENGTH - Towel Curls  Sit in a chair positioned on a non-carpeted surface.  Place your foot on a towel, keeping your heel on the floor.  Pull the towel toward your heel by only curling your  toes. Keep your heel on the floor. Repeat 3 times. Complete this exercise 2 times per day.  STRENGTH - Ankle Inversion  Secure one end of a rubber exercise band/tubing to a fixed object (table, pole). Loop the other end around your foot just before your toes.  Place your fists between your knees. This will focus your strengthening at your ankle.  Slowly, pull your big toe up and in, making sure the band/tubing is positioned to resist the entire motion.  Hold this position for 10 seconds.  Have your muscles resist the band/tubing as it slowly pulls your foot back to the starting position. Repeat 3 times. Complete this exercises 2 times per day.  Document Released: 01/16/2005 Document Revised: 04/10/2011 Document Reviewed: 04/30/2008 [facility] Patient Information 2014 [place], [place].

## 2015-12-16 NOTE — Progress Notes (Signed)
Subjective:     Patient ID: Samuel Moyer, male   DOB: Jul 23, 1940, 75 y.o.   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 I try to soak them and trimmed them without relief   Review of Systems  All other systems reviewed and are negative.      Objective:   Physical Exam  Constitutional: He is oriented to person, place, and time.  Cardiovascular: Intact distal pulses.   Musculoskeletal: Normal range of motion.  Neurological: He is oriented to person, place, and time.  Skin: Skin is warm.  Nursing note and vitals reviewed.  neurovascular status was found to be intact with muscle strength adequate range of motion within normal limits. Patient has incurvated lateral border right hallux medial border left hallux with pain when pressed and is noted to have mild to moderate posterior pain in the left heel at the insertion of the Achilles into the calcaneus. Patient is found to have good digital perfusion and is well oriented 3     Assessment:     Ingrown toenail deformity hallux bilateral with posterior heel pain left    Plan:     H&P conditions reviewed and recommended correction of deformity. Patient will start stretching exercises for the heel along with compression stocking was silicone and today for the ingrown's I've recommended correction explaining risk. Patient wants surgery and today I infiltrated each hallux 60 mg Xylocaine Marcaine mixture remove the lateral border the right hallux medial border the left hallux exposed matrix and applied phenol 3 applications 30 seconds followed by alcohol lavage and sterile dressing. Gave instructions on soaks and reappoint

## 2015-12-18 ENCOUNTER — Encounter: Payer: Self-pay | Admitting: Internal Medicine

## 2015-12-28 ENCOUNTER — Telehealth: Payer: Self-pay | Admitting: *Deleted

## 2015-12-28 NOTE — Telephone Encounter (Signed)
Called patient at [phone] (Home #) to check to see how they were doing from their ingrown toenail procedure that was performed on Wednesday, December 15, 2015. Pt stated, "Doing fine and not in any pain".

## 2016-01-21 DIAGNOSIS — H353131 Nonexudative age-related macular degeneration, bilateral, early dry stage: Secondary | ICD-10-CM | POA: Diagnosis not present

## 2016-01-21 DIAGNOSIS — H40013 Open angle with borderline findings, low risk, bilateral: Secondary | ICD-10-CM | POA: Diagnosis not present

## 2016-01-21 DIAGNOSIS — H2513 Age-related nuclear cataract, bilateral: Secondary | ICD-10-CM | POA: Diagnosis not present

## 2016-01-21 DIAGNOSIS — H40053 Ocular hypertension, bilateral: Secondary | ICD-10-CM | POA: Diagnosis not present

## 2016-01-21 DIAGNOSIS — H25013 Cortical age-related cataract, bilateral: Secondary | ICD-10-CM | POA: Diagnosis not present

## 2016-01-24 ENCOUNTER — Other Ambulatory Visit: Payer: Self-pay | Admitting: Internal Medicine

## 2016-02-02 DIAGNOSIS — H40053 Ocular hypertension, bilateral: Secondary | ICD-10-CM | POA: Diagnosis not present

## 2016-02-02 DIAGNOSIS — H40013 Open angle with borderline findings, low risk, bilateral: Secondary | ICD-10-CM | POA: Diagnosis not present

## 2016-02-10 ENCOUNTER — Encounter: Payer: Self-pay | Admitting: Gastroenterology

## 2016-02-10 ENCOUNTER — Other Ambulatory Visit: Payer: Self-pay | Admitting: Emergency Medicine

## 2016-02-10 MED ORDER — OSELTAMIVIR PHOSPHATE 75 MG PO CAPS: 75.0000 mg | ORAL_CAPSULE | Freq: Every day | ORAL | 0 refills | Status: DC

## 2016-02-10 NOTE — Telephone Encounter (Signed)
Pt has been around granddaughter all week that was recently dxed with the flu. Med pending.

## 2016-02-14 ENCOUNTER — Ambulatory Visit (INDEPENDENT_AMBULATORY_CARE_PROVIDER_SITE_OTHER): Payer: Medicare Other | Admitting: Internal Medicine

## 2016-02-14 ENCOUNTER — Encounter: Payer: Self-pay | Admitting: Internal Medicine

## 2016-02-14 VITALS — BP 146/82 | HR 72 | Temp 98.0°F | Resp 16 | Wt 229.0 lb

## 2016-02-14 DIAGNOSIS — J069 Acute upper respiratory infection, unspecified: Secondary | ICD-10-CM | POA: Insufficient documentation

## 2016-02-14 DIAGNOSIS — B9789 Other viral agents as the cause of diseases classified elsewhere: Secondary | ICD-10-CM

## 2016-02-14 NOTE — Patient Instructions (Addendum)
Continue symptomatic treatment for your cold symptoms.   If your symptoms get worse please call and we will consider starting an antibiotic.   You can use coricidin cold products or plain mucinex.  Continue the saline nasal rinses.

## 2016-02-14 NOTE — Progress Notes (Signed)
Subjective:    Patient ID: Samuel Moyer, male    DOB: 04/30/1940, 76 y.o.   MRN: [id number]  HPI He is here for an acute visit for cold symptoms.  His symptoms started 5-6 days ago.    He is experiencing sore throat initially that has resolved and an ear ache that has resolved.  He has post nasal drip and a cough.  He has brought up phlegm on occasion.  He sneezes.  He has heard wheezing on occasion. He denies any shortness of breath or fever.  His appetite has been normal.  He has taken tamiflu recently because his granddaughter had it.  He also tried a sinus rinse which helped a little.  He has used cough drops.   Today his symptoms feel better than they have in the past 4 days.  He has a history of pneumonia.   Medications and allergies reviewed with patient and updated if appropriate.  Patient Active Problem List   Diagnosis Date Noted  . Ingrown toenail 12/03/2015  . Gout attack 06/03/2015  . Prediabetes 03/05/2015  . Obesity 12/28/2014  . Edema 04/07/2014  . Venous (peripheral) insufficiency 02/16/2014  . Erysipelas of lower extremity 02/02/2014  . OSA (obstructive sleep apnea) 05/24/2010  . ATRIAL FIBRILLATION  01/26/2010  . Chronic systolic heart failure ([place]) 01/26/2010  . Asthma 01/14/2010  . Hypercholesterolemia 10/21/2007  . Anxiety 10/21/2007  . Essential hypertension 02/26/2006  . COLONIC POLYPS, HX OF 02/26/2006    Current Outpatient Prescriptions on File Prior to Visit  Medication Sig Dispense Refill  . amLODipine (NORVASC) 5 MG tablet Take 1 tablet (5 mg total) by mouth daily. 90 tablet 3  . atorvastatin (LIPITOR) 10 MG tablet TAKE 1 TABLET DAILY 90 tablet 2  . benazepril (LOTENSIN) 40 MG tablet TAKE 1 TABLET DAILY 90 tablet 2  . dabigatran (PRADAXA) 150 MG CAPS capsule TAKE 1 CAPSULE EVERY 12 HOURS 180 capsule 3  . furosemide (LASIX) 40 MG tablet Take 1 tablet (40 mg total) by mouth daily. 90 tablet 3  . glucose blood (ONE TOUCH ULTRA TEST) test strip  Check blood sugar daily as directed 100 each 0  . metoprolol tartrate (LOPRESSOR) 25 MG tablet Take 2 tablets (50 mg total) by mouth 2 (two) times daily. 360 tablet 3  . Multiple Vitamin (MULTIVITAMIN) tablet Take 1 tablet by mouth daily.      [name] DELICA LANCETS 99991111 MISC 1 each by Other route daily. Check blood sugar daily as directed 100 each 0  . oseltamivir (TAMIFLU) 75 MG capsule Take 1 capsule (75 mg total) by mouth daily. 10 capsule 0  . PARoxetine (PAXIL) 20 MG tablet TAKE ONE-HALF (1/2) TABLET DAILY 45 tablet 2   No current facility-administered medications on file prior to visit.     Past Medical History:  Diagnosis Date  . A-fib ([place])   . Anxiety   . Atrial fibrillation ([place])   . CHF (congestive heart failure) ([place])   . Claustrophobia    Occasionally when flying   . Colitis   . Diabetes mellitus, type 2 ([place])   . Gout   . Hemorrhoids   . Hyperlipidemia   . Hypertension   . LV dysfunction    EF 40-45%  . OSA (obstructive sleep apnea)    CPAP machine   . PVC's (premature ventricular contractions)     Past Surgical History:  Procedure Laterality Date  . CARDIOVASCULAR STRESS TEST  03/02/2010   EF 50%  .  US ECHOCARDIOGRAPHY  11/15/2009   EF 40-45%    Social History   Social History  . Marital status: Married    Spouse name: [name]  . Number of children: 3  . Years of education: [name]   Occupational History  . Retired Retired    [employer]    Social History Main Topics  . Smoking status: Former Smoker    Packs/day: 1.00    Years: 16.00    Types: Cigarettes    Quit date: 06/30/1977  . Smokeless tobacco: Never Used  . Alcohol use No  . Drug use: No  . Sexual activity: Not on file   Other Topics Concern  . Not on file   Social History Narrative   2 caffeine drinks daily     Family History  Problem Relation Age of Onset  . Hypertension Father   . Heart attack Father     Age 57 (MI)  . Heart failure Father   . Colonic polyp Sister     and  Father  . Stroke Mother   . Colon cancer Neg Hx   . Stomach cancer Neg Hx     Review of Systems  Constitutional: Negative for appetite change and fever.  HENT: Positive for congestion (yellow - green), postnasal drip, sneezing and sore throat.   Respiratory: Positive for cough and wheezing (mild). Negative for chest tightness and shortness of breath.   Cardiovascular: Negative for chest pain.  Gastrointestinal: Negative for diarrhea, nausea and vomiting.  Musculoskeletal: Negative for myalgias.  Neurological: Negative for light-headedness and headaches.       Objective:   Vitals:   02/14/16 1619  BP: (!) 146/82  Pulse: 72  Resp: 16  Temp: 98 F (36.7 C)   Filed Weights   02/14/16 1619  Weight: 229 lb (103.9 kg)   Body mass index is 34.82 kg/m.  Wt Readings from Last 3 Encounters:  02/14/16 229 lb (103.9 kg)  12/15/15 220 lb (99.8 kg)  12/03/15 229 lb (103.9 kg)     Physical Exam GENERAL APPEARANCE: Appears stated age, well appearing, NAD EYES: conjunctiva clear, no icterus HEENT: bilateral tympanic membranes and ear canals normal, oropharynx with mild erythema, no thyromegaly, trachea midline, no cervical or supraclavicular lymphadenopathy LUNGS: Clear to auscultation without wheeze or crackles, unlabored breathing, good air entry bilaterally HEART: Normal S1,S2 without murmurs EXTREMITIES: Without clubbing, cyanosis, or edema        Assessment & Plan:   See Problem List for Assessment and Plan of chronic medical problems.

## 2016-02-14 NOTE — Assessment & Plan Note (Signed)
He recently completed Tamiflu and I do not feel the he has the flu No evidence of pneumonia-lungs are clear, oxygenation is very good His symptoms are improving and are mild at this time Likely viral in nature Continue symptomatic treatment with nasal rinse, Tylenol, cough drops, Coricidin or plain Mucinex If his symptoms worsen or change she will call immediately-did discuss with him that he is at an increased risk of pneumonia and I will prescribe an antibiotic over the next couple of days if needed if his symptoms worsen He agrees with plan

## 2016-02-14 NOTE — Progress Notes (Signed)
Pre visit review using our clinic review tool, if applicable. No additional management support is needed unless otherwise documented below in the visit note. 

## 2016-02-28 ENCOUNTER — Other Ambulatory Visit: Payer: Self-pay | Admitting: Cardiology

## 2016-03-11 ENCOUNTER — Other Ambulatory Visit: Payer: Self-pay | Admitting: Cardiology

## 2016-03-13 ENCOUNTER — Other Ambulatory Visit: Payer: Self-pay | Admitting: Cardiology

## 2016-03-19 NOTE — Progress Notes (Deleted)
Samuel Moyer Date of Birth: [birth year]   History of Present Illness: Samuel Moyer is seen today for followup of CHF and atrial fibrillation. He has a history of permanent atrial fibrillation. He also is a history of congestive heart failure with ejection fraction of 40-45%. Normal myoview in 2011 with EF of 50% at that time. He does have OSA on CPAP and is followed by pulmonary.  On followup today he reports he is doing well. He denies any SOB, chest pain, or palpitations. No dizziness or fatigue.  He reports BP at home has been doing well.He is unaware of his Afib. He does have some sinus drainage with a cough in the early am. When seen by primary care BP was not optimal and his HCTZ was stopped and he was started on amlodipine 5 mg daily. Since then he has noted more swelling. He is still on lasix 40 mg daily. He has been more careful with salt intake.   Current Outpatient Prescriptions on File Prior to Visit  Medication Sig Dispense Refill  . amLODipine (NORVASC) 5 MG tablet Take 1 tablet (5 mg total) by mouth daily. 90 tablet 3  . atorvastatin (LIPITOR) 10 MG tablet TAKE 1 TABLET DAILY 90 tablet 2  . benazepril (LOTENSIN) 40 MG tablet TAKE 1 TABLET DAILY 90 tablet 2  . dabigatran (PRADAXA) 150 MG CAPS capsule Take 1 capsule by mouth every 12 hours.  180 capsule 0  . furosemide (LASIX) 40 MG tablet TAKE 1 TABLET DAILY 90 tablet 3  . glucose blood (ONE TOUCH ULTRA TEST) test strip Check blood sugar daily as directed 100 each 0  . metoprolol tartrate (LOPRESSOR) 25 MG tablet TAKE 2 TABLETS TWICE A DAY 360 tablet 3  . Multiple Vitamin (MULTIVITAMIN) tablet Take 1 tablet by mouth daily.      [name] DELICA LANCETS 99991111 MISC 1 each by Other route daily. Check blood sugar daily as directed 100 each 0  . oseltamivir (TAMIFLU) 75 MG capsule Take 1 capsule (75 mg total) by mouth daily. 10 capsule 0  . PARoxetine (PAXIL) 20 MG tablet TAKE ONE-HALF (1/2) TABLET DAILY 45 tablet 2   No current  facility-administered medications on file prior to visit.     No Known Allergies  Past Medical History:  Diagnosis Date  . A-fib ([place])   . Anxiety   . Atrial fibrillation ([place])   . CHF (congestive heart failure) ([place])   . Claustrophobia    Occasionally when flying   . Colitis   . Diabetes mellitus, type 2 ([place])   . Gout   . Hemorrhoids   . Hyperlipidemia   . Hypertension   . LV dysfunction    EF 40-45%  . OSA (obstructive sleep apnea)    CPAP machine   . PVC's (premature ventricular contractions)     Past Surgical History:  Procedure Laterality Date  . CARDIOVASCULAR STRESS TEST  03/02/2010   EF 50%  . US ECHOCARDIOGRAPHY  11/15/2009   EF 40-45%    History  Smoking Status  . Former Smoker  . Packs/day: 1.00  . Years: 16.00  . Types: Cigarettes  . Quit date: 06/30/1977  Smokeless Tobacco  . Never Used    History  Alcohol Use No    Family History  Problem Relation Age of Onset  . Hypertension Father   . Heart attack Father     Age 66 (MI)  . Heart failure Father   . Colonic polyp Sister  and Father  . Stroke Mother   . Colon cancer Neg Hx   . Stomach cancer Neg Hx     Review of Systems: As noted in history of present illness  All other systems were reviewed and are negative.  Physical Exam: There were no vitals taken for this visit. The patient is alert and oriented x 3.   The skin is warm and dry.    The HEENT exam  is normal.  The carotids are 2+ without bruits.  There is no thyromegaly.  There is no JVD.  The lungs are clear.   The heart exam reveals  an irregular  rate with a normal S1 and S2.  There are no murmurs, gallops, or rubs.  The PMI is not displaced.   Abdominal exam reveals good bowel sounds. Exam of the legs reveals  1+pretibial edema L>R.   The distal pulses are intact.  Cranial nerves II - XII are intact.  Motor and sensory functions are intact.  The gait is normal.  LABORATORY DATA: Lab Results  Component Value Date   WBC 11.6  (H) 12/17/2013   HGB 14.2 12/17/2013   HCT 42.1 12/17/2013   PLT 290 12/17/2013   GLUCOSE 112 (H) 12/14/2015   CHOL 159 12/14/2015   TRIG 75.0 12/14/2015   HDL 54.10 12/14/2015   LDLCALC 90 12/14/2015   ALT 11 12/14/2015   AST 16 12/14/2015   NA 142 12/14/2015   K 3.7 12/14/2015   CL 104 12/14/2015   CREATININE 1.04 12/14/2015   BUN 13 12/14/2015   CO2 30 12/14/2015   TSH 1.30 06/13/2013   PSA 0.63 09/12/2007   HGBA1C 6.0 12/14/2015   MICROALBUR 1.8 06/13/2013   Ecg today shows Afib with rate 65. RBBB. Nonspecific TWA. I have personally reviewed and interpreted this study.  Assessment / Plan: 1. Permanent atrial fibrillation. Rate is well controlled.  Continue metoprolol.  He is on chronic anticoagulation with Pradaxa. Renal function has been normal.  2. Congestive heart failure with chronic systolic dysfunction. Ejection fraction is mildly reduced. Continue ACE inhibitor, beta blocker, and diuretic therapy.  Follow up in 6 months.  3. Hypertension, BP is well controlled according to home records. Some increased edema related to amlodipine. Will monitor for now.

## 2016-03-21 ENCOUNTER — Ambulatory Visit (INDEPENDENT_AMBULATORY_CARE_PROVIDER_SITE_OTHER): Payer: Medicare Other | Admitting: Cardiology

## 2016-03-21 ENCOUNTER — Encounter: Payer: Self-pay | Admitting: Cardiology

## 2016-03-21 ENCOUNTER — Ambulatory Visit: Payer: Medicare Other | Admitting: Cardiology

## 2016-03-21 VITALS — BP 150/80 | Ht 68.0 in | Wt 230.8 lb

## 2016-03-21 DIAGNOSIS — I482 Chronic atrial fibrillation, unspecified: Secondary | ICD-10-CM

## 2016-03-21 DIAGNOSIS — E7800 Pure hypercholesterolemia, unspecified: Secondary | ICD-10-CM | POA: Diagnosis not present

## 2016-03-21 DIAGNOSIS — I10 Essential (primary) hypertension: Secondary | ICD-10-CM

## 2016-03-21 MED ORDER — AMLODIPINE BESYLATE 5 MG PO TABS: 7.5000 mg | ORAL_TABLET | Freq: Every day | ORAL | 3 refills | Status: DC

## 2016-03-21 NOTE — Patient Instructions (Addendum)
Increase amlodipine to 7.5 mg daily  Continue your other therapy  Work on increasing your aerobic activity and lose weight.   I will see you in 6 months

## 2016-03-21 NOTE — Progress Notes (Signed)
Samuel Moyer Date of Birth: March 15, 1940   History of Present Illness: Samuel Moyer is seen today for followup of CHF and atrial fibrillation. He has a history of permanent atrial fibrillation. He also is a history of congestive heart failure with ejection fraction of 40-45%. Normal myoview in 2011 with EF of 50% at that time. He does have OSA on CPAP and is followed by pulmonary.  On followup today he reports he is doing well. He denies any SOB, chest pain, or palpitations. No dizziness or fatigue.Marland KitchenHe is unaware of his Afib. He does have some sinus drainage related to a recent head cold.  He is still on lasix 40 mg daily. He reports he has not been as active this winter and diet has not been as good. Edema is better.  Current Outpatient Prescriptions on File Prior to Visit  Medication Sig Dispense Refill  . amLODipine (NORVASC) 5 MG tablet Take 1 tablet (5 mg total) by mouth daily. 90 tablet 3  . atorvastatin (LIPITOR) 10 MG tablet TAKE 1 TABLET DAILY 90 tablet 2  . benazepril (LOTENSIN) 40 MG tablet TAKE 1 TABLET DAILY 90 tablet 2  . dabigatran (PRADAXA) 150 MG CAPS capsule Take 1 capsule by mouth every 12 hours.  180 capsule 0  . furosemide (LASIX) 40 MG tablet TAKE 1 TABLET DAILY 90 tablet 3  . glucose blood (ONE TOUCH ULTRA TEST) test strip Check blood sugar daily as directed 100 each 0  . metoprolol tartrate (LOPRESSOR) 25 MG tablet TAKE 2 TABLETS TWICE A DAY 360 tablet 3  . Multiple Vitamin (MULTIVITAMIN) tablet Take 1 tablet by mouth daily.      [name] DELICA LANCETS [id number] MISC 1 each by Other route daily. Check blood sugar daily as directed 100 each 0  . PARoxetine (PAXIL) 20 MG tablet TAKE ONE-HALF (1/2) TABLET DAILY 45 tablet 2   No current facility-administered medications on file prior to visit.     No Known Allergies  Past Medical History:  Diagnosis Date  . A-fib ([place])   . Anxiety   . Atrial fibrillation ([place])   . CHF (congestive heart failure) ([place])   . Claustrophobia     Occasionally when flying   . Colitis   . Diabetes mellitus, type 2 ([place])   . Gout   . Hemorrhoids   . Hyperlipidemia   . Hypertension   . LV dysfunction    EF 40-45%  . OSA (obstructive sleep apnea)    CPAP machine   . PVC's (premature ventricular contractions)     Past Surgical History:  Procedure Laterality Date  . CARDIOVASCULAR STRESS TEST  03/02/2010   EF 50%  . US ECHOCARDIOGRAPHY  11/15/2009   EF 40-45%    History  Smoking Status  . Former Smoker  . Packs/day: 1.00  . Years: 16.00  . Types: Cigarettes  . Quit date: 06/30/1977  Smokeless Tobacco  . Never Used    History  Alcohol Use No    Family History  Problem Relation Age of Onset  . Hypertension Father   . Heart attack Father     Age 32 (MI)  . Heart failure Father   . Colonic polyp Sister     and Father  . Stroke Mother   . Colon cancer Neg Hx   . Stomach cancer Neg Hx     Review of Systems: As noted in history of present illness  All other systems were reviewed and are negative.  Physical Exam: BP Marland Kitchen)  150/80 (BP Location: Right Arm, Patient Position: Sitting, Cuff Size: Normal)   Ht 5\' 8"  (1.727 m)   Wt 230 lb 12.8 oz (104.7 kg)   BMI 35.09 kg/m  The patient is alert and oriented x 3.   The skin is warm and dry.    The HEENT exam  is normal.  The carotids are 2+ without bruits.  There is no thyromegaly.  There is no JVD.  The lungs are clear.   The heart exam reveals  an irregular  rate with a normal S1 and S2.  There are no murmurs, gallops, or rubs.  The PMI is not displaced.   Abdominal exam reveals good bowel sounds. Exam of the legs reveals  No edema.    The distal pulses are intact.  Cranial nerves II - XII are intact.  Motor and sensory functions are intact.  The gait is normal.  LABORATORY DATA: Lab Results  Component Value Date   WBC 11.6 (H) 12/17/2013   HGB 14.2 12/17/2013   HCT 42.1 12/17/2013   PLT 290 12/17/2013   GLUCOSE 112 (H) 12/14/2015   CHOL 159 12/14/2015   TRIG  75.0 12/14/2015   HDL 54.10 12/14/2015   LDLCALC 90 12/14/2015   ALT 11 12/14/2015   AST 16 12/14/2015   NA 142 12/14/2015   K 3.7 12/14/2015   CL 104 12/14/2015   CREATININE 1.04 12/14/2015   BUN 13 12/14/2015   CO2 30 12/14/2015   TSH 1.30 06/13/2013   PSA 0.63 09/12/2007   HGBA1C 6.0 12/14/2015   MICROALBUR 1.8 06/13/2013     Assessment / Plan: 1. Permanent atrial fibrillation. Rate is well controlled.  Continue metoprolol.  He is on chronic anticoagulation with Pradaxa. Renal function has been normal.  2. Congestive heart failure with chronic systolic dysfunction. Ejection fraction is mildly reduced. Continue ACE inhibitor, beta blocker, and diuretic therapy.  Follow up in 6 months.  3. Hypertension, BP is poorly controlled. Will increase amlodipine to 7.5 mg daily. He apparently had increased edema on 10 mg. Stressed importance of working on lifestyle modification with increased aerobic activity and weight loss.  If additional BP medication needed I would use hydralazine.

## 2016-03-24 ENCOUNTER — Telehealth: Payer: Self-pay

## 2016-03-24 NOTE — Telephone Encounter (Signed)
Received clearance from Dr.Lane patient scheduled to have 3 dental extractions.Dr.Jordan advised ok to hold Pradaxa 48 hours prior to extractions.Form faxed back to fax # [phone].Patient was called and told of instructions.

## 2016-03-25 ENCOUNTER — Ambulatory Visit (INDEPENDENT_AMBULATORY_CARE_PROVIDER_SITE_OTHER): Payer: Medicare Other | Admitting: Physician Assistant

## 2016-03-25 ENCOUNTER — Ambulatory Visit (INDEPENDENT_AMBULATORY_CARE_PROVIDER_SITE_OTHER): Payer: Medicare Other

## 2016-03-25 VITALS — BP 148/78 | HR 88 | Temp 98.1°F | Ht 68.0 in | Wt 230.2 lb

## 2016-03-25 DIAGNOSIS — L03113 Cellulitis of right upper limb: Secondary | ICD-10-CM

## 2016-03-25 DIAGNOSIS — M25521 Pain in right elbow: Secondary | ICD-10-CM

## 2016-03-25 DIAGNOSIS — M25421 Effusion, right elbow: Secondary | ICD-10-CM | POA: Diagnosis not present

## 2016-03-25 DIAGNOSIS — M7031 Other bursitis of elbow, right elbow: Secondary | ICD-10-CM | POA: Diagnosis not present

## 2016-03-25 DIAGNOSIS — R05 Cough: Secondary | ICD-10-CM

## 2016-03-25 DIAGNOSIS — R059 Cough, unspecified: Secondary | ICD-10-CM

## 2016-03-25 LAB — POCT CBC
GRANULOCYTE PERCENT: 57.9 % (ref 37–80)
HEMATOCRIT: 39.5 % — AB (ref 43.5–53.7)
Hemoglobin: 13.6 g/dL — AB (ref 14.1–18.1)
Lymph, poc: 4.7 — AB (ref 0.6–3.4)
MCH, POC: 32.2 pg — AB (ref 27–31.2)
MCHC: 34.3 g/dL (ref 31.8–35.4)
MCV: 93.7 fL (ref 80–97)
MID (CBC): 1.6 — AB (ref 0–0.9)
MPV: 7.1 fL (ref 0–99.8)
PLATELET COUNT, POC: 237 10*3/uL (ref 142–424)
POC GRANULOCYTE: 8.6 — AB (ref 2–6.9)
POC LYMPH %: 31.5 % (ref 10–50)
POC MID %: 10.6 % (ref 0–12)
RBC: 4.22 M/uL — AB (ref 4.69–6.13)
RDW, POC: 16.6 %
WBC: 14.9 10*3/uL — AB (ref 4.6–10.2)

## 2016-03-25 MED ORDER — CEFTRIAXONE SODIUM 1 G IJ SOLR
1.0000 g | Freq: Once | INTRAMUSCULAR | Status: AC
  Administered 2016-03-25: 1 g via INTRAMUSCULAR

## 2016-03-25 MED ORDER — DOXYCYCLINE HYCLATE 100 MG PO CAPS: 100.0000 mg | ORAL_CAPSULE | Freq: Two times a day (BID) | ORAL | 0 refills | Status: DC

## 2016-03-25 MED ORDER — CEPHALEXIN 500 MG PO CAPS: 500.0000 mg | ORAL_CAPSULE | Freq: Three times a day (TID) | ORAL | 0 refills | Status: AC

## 2016-03-25 NOTE — Progress Notes (Signed)
MRN: [id number] DOB: Dec 22, 1940  Subjective:   Samuel Moyer is a 76 y.o. male presenting for chief complaint of Elbow Injury (X 3 days right elbow -with swelling) .  He was playing tug of war with his dog 5 days ago with his right arm. All was fine but the next day he woke up and had right elbow pain. Has associated swelling and redness. Denies fever, chills, numbness, and tingling. No hx of DVT or clots. He is on pradaxa for afib.   Pt also reports 10 day history of productive cough (no hemoptysis). Three weeks ago he was treated with tamiflu for flu and thought he was getting better but then he developed a worsening productive cough. Has tried cough drops with moderate relief. Denies fever, sinus congestion, ear pain, sore throat, shortness of breath, chest pain and myalgia, nausea, vomiting, abdominal pain and diarrhea. Has had sick contact with grandson. No hx of asthma or COPD. Smoked 1ppd x 17 years, quit 1979. He is followed closely by pulmonology for OSA.    Noahh has a current medication list which includes the following prescription(s): amlodipine, atorvastatin, benazepril, dabigatran, furosemide, glucose blood, metoprolol tartrate, multivitamin, onetouch delica lancets 0000000, paroxetine, and doxycycline, and the following Facility-Administered Medications: ceftriaxone. Also has No Known Allergies.  Payton  has a past medical history of A-fib ([place]); Anxiety; Atrial fibrillation ([place]); CHF (congestive heart failure) ([place]); Claustrophobia; Colitis; Diabetes mellitus, type 2 ([place]); Gout; Hemorrhoids; Hyperlipidemia; Hypertension; LV dysfunction; OSA (obstructive sleep apnea); and PVC's (premature ventricular contractions). Also  has a past surgical history that includes US ECHOCARDIOGRAPHY (11/15/2009) and Cardiovascular stress test (03/02/2010).   Objective:   Vitals: BP (!) 148/78 (BP Location: Right Arm, Patient Position: Sitting, Cuff Size: Large)   Pulse 88   Temp 98.1 F (36.7 C) (Oral)    Ht 5\' 8"  (1.727 m)   Wt 230 lb 3.2 oz (104.4 kg)   SpO2 93%   BMI 35.00 kg/m   Physical Exam  Constitutional: He is oriented to person, place, and time. He appears well-developed and well-nourished.  HENT:  Head: Normocephalic and atraumatic.  Eyes: Conjunctivae are normal.  Neck: Normal range of motion.  Cardiovascular:  Pulses:      Radial pulses are 2+ on the right side, and 2+ on the left side.  Pulmonary/Chest: Effort normal and breath sounds normal.  Pt is breathing with pursed lips.   Musculoskeletal:       Right elbow: He exhibits decreased range of motion (with flexion). Tenderness found.  Swelling, warmth, and erythema noted overlying right elbow. Erythema extends circumferentially around elbow. Swelling extends noted the medial aspect of the left forearm. There is an apparent effusion palpated over left elbow. There is a small skin abrasion noted over elbow and medial to antecubital fossa.   Neurological: He is alert and oriented to person, place, and time.  Skin: Skin is warm and dry.  Psychiatric: He has a normal mood and affect.  Vitals reviewed.  Dg Chest 2 View  Result Date: 03/25/2016 CLINICAL DATA:  Productive cough for 10 days. EXAM: CHEST  2 VIEW COMPARISON:  01/20/2013 FINDINGS: Mild cardiomegaly again noted as well as ectasia of the thoracic aorta. No evidence of pulmonary infiltrate or edema. No evidence of pleural effusion. IMPRESSION: Stable cardiomegaly.  No active lung disease. Electronically Signed   By: Earle Gell M.D.   On: 03/25/2016 16:11   Dg Elbow Complete Right (3+view)  Result Date: 03/25/2016 CLINICAL DATA:  Right elbow  pain and swelling.  No known injury. EXAM: RIGHT ELBOW - COMPLETE 3+ VIEW COMPARISON:  None. FINDINGS: There is extensive infiltration of fat in the medial soft tissues of the distal upper arm and proximal 15 cm of the forearm. No soft tissue gas collection or radiopaque foreign body is identified. No bony abnormality is seen.  Small elbow joint effusion is noted. IMPRESSION: Extensive infiltration of subcutaneous fat along the medial aspect of the right arm as above is nonspecific but likely due to cellulitis. Negative for soft tissue gas or foreign body. Small joint scratch the small elbow joint effusion. No bony abnormality. Electronically Signed   By: Inge Rise M.D.   On: 03/25/2016 16:11    Results for orders placed or performed in visit on 03/25/16 (from the past 24 hour(s))  POCT CBC     Status: Abnormal   Collection Time: 03/25/16  4:19 PM  Result Value Ref Range   WBC 14.9 (A) 4.6 - 10.2 K/uL   Lymph, poc 4.7 (A) 0.6 - 3.4   POC LYMPH PERCENT 31.5 10 - 50 %L   MID (cbc) 1.6 (A) 0 - 0.9   POC MID % 10.6 0 - 12 %M   POC Granulocyte 8.6 (A) 2 - 6.9   Granulocyte percent 57.9 37 - 80 %G   RBC 4.22 (A) 4.69 - 6.13 M/uL   Hemoglobin 13.6 (A) 14.1 - 18.1 g/dL   HCT, POC 39.5 (A) 43.5 - 53.7 %   MCV 93.7 80 - 97 fL   MCH, POC 32.2 (A) 27 - 31.2 pg   MCHC 34.3 31.8 - 35.4 g/dL   RDW, POC 16.6 %   Platelet Count, POC 237 142 - 424 K/uL   MPV 7.1 0 - 99.8 fL    Assessment and Plan :  1. Right elbow pain - DG ELBOW COMPLETE RIGHT (3+VIEW); Future - POCT CBC 2. Bursitis of right elbow, unspecified bursa 3. Cellulitis of right upper extremity - cefTRIAXone (ROCEPHIN) injection 1 g; Inject 1 g into the muscle once. - doxycycline (VIBRAMYCIN) 100 MG capsule; Take 1 capsule (100 mg total) by mouth 2 (two) times daily.  Dispense: 20 capsule; Refill: 0 - cephALEXin (KEFLEX) 500 MG capsule; Take 1 capsule (500 mg total) by mouth 3 (three) times daily.  Dispense: 30 capsule; Refill: 0 -History and physical exam findings suggest possible underlying elbow bursitis with overlying skin cellulitis. Will treat with antibiotics. Suspect that cellulitis should improve over the next 48 hours. Pt informed to return on Monday for reevaluation with Dr. Mitchel Honour. At this time, patient may benefit from elbow aspiration if  elbow bursitis still palpable.   4. Cough -Plain films reassuring however will still cover empirically for underlying pulmonary infectious etiology as patient has history consistent with a double sickening from influenza virus.  - DG Chest 2 View; Future - doxycycline (VIBRAMYCIN) 100 MG capsule; Take 1 capsule (100 mg total) by mouth 2 (two) times daily.  Dispense: 20 capsule; Refill: 0  Tenna Delaine, PA-C  [facility] 03/25/2016 4:23 PM

## 2016-03-25 NOTE — Patient Instructions (Addendum)
Take antibiotics as prescribed. Continue placing ice on the affected area.  You should have improvement in the next 24-48 hours. If you start having worsening pain, fever, chills, or inability to move arm, go to ED immediately.  Follow up in 48 hours with Dr. Mitchel Honour in clinic for reevaluation.    Cellulitis, Adult Introduction Cellulitis is a skin infection. The infected area is usually red and sore. This condition occurs most often in the arms and lower legs. It is very important to get treated for this condition. Follow these instructions at home:  Take over-the-counter and prescription medicines only as told by your doctor.  If you were prescribed an antibiotic medicine, take it as told by your doctor. Do not stop taking the antibiotic even if you start to feel better.  Drink enough fluid to keep your pee (urine) clear or pale yellow.  Do not touch or rub the infected area.  Raise (elevate) the infected area above the level of your heart while you are sitting or lying down.  Place warm or cold wet cloths (warm or cold compresses) on the infected area. Do this as told by your doctor.  Keep all follow-up visits as told by your doctor. This is important. These visits let your doctor make sure your infection is not getting worse. Contact a doctor if:  You have a fever.  Your symptoms do not get better after 1-2 days of treatment.  Your bone or joint under the infected area starts to hurt after the skin has healed.  Your infection comes back. This can happen in the same area or another area.  You have a swollen bump in the infected area.  You have new symptoms.  You feel ill and also have muscle aches and pains. Get help right away if:  Your symptoms get worse.  You feel very sleepy.  You throw up (vomit) or have watery poop (diarrhea) for a long time.  There are red streaks coming from the infected area.  Your red area gets larger.  Your red area turns darker. This  information is not intended to replace advice given to you by your health care provider. Make sure you discuss any questions you have with your health care provider. Document Released: 07/05/2007 Document Revised: 06/24/2015 Document Reviewed: 11/25/2014  2017 Elsevier   Bursitis Introduction Bursitis is when the fluid-filled sac (bursa) that covers and protects a joint is swollen (inflamed). Bursitis is most common near joints, especially the knees, elbows, hips, and shoulders. Follow these instructions at home:  Take medicines only as told by your doctor.  If you were prescribed an antibiotic medicine, finish it all even if you start to feel better.  Rest the affected area as told by your doctor.  Keep the area raised up.  Avoid doing things that make the pain worse.  Apply ice to the injured area:  Place ice in a plastic bag.  Place a towel between your skin and the bag.  Leave the ice on for 20 minutes, 2-3 times a day.  Use splints, braces, pads, or walking aids as told by your doctor.  Keep all follow-up visits as told by your doctor. This is important. Contact a doctor if:  You have more pain with home care.  You have a fever.  You have chills. This information is not intended to replace advice given to you by your health care provider. Make sure you discuss any questions you have with your health care provider. Document  Released: 07/06/2009 Document Revised: 06/24/2015 Document Reviewed: 04/07/2013  2017 Elsevier     IF you received an x-ray today, you will receive an invoice from [facility]. Please contact [facility] at [phone] with questions or concerns regarding your invoice.   IF you received labwork today, you will receive an invoice from [facility]. Please contact [facility] at [phone] with questions or concerns regarding your invoice.   Our billing staff will not be able to assist you with questions regarding bills from these  companies.  You will be contacted with the lab results as soon as they are available. The fastest way to get your results is to activate your My Chart account. Instructions are located on the last page of this paperwork. If you have not heard from [place] regarding the results in 2 weeks, please contact this office.

## 2016-03-27 ENCOUNTER — Ambulatory Visit (INDEPENDENT_AMBULATORY_CARE_PROVIDER_SITE_OTHER): Payer: Medicare Other | Admitting: Emergency Medicine

## 2016-03-27 ENCOUNTER — Telehealth: Payer: Self-pay | Admitting: *Deleted

## 2016-03-27 VITALS — BP 138/80 | HR 83 | Temp 97.4°F | Resp 18 | Ht 68.0 in | Wt 227.4 lb

## 2016-03-27 DIAGNOSIS — L03113 Cellulitis of right upper limb: Secondary | ICD-10-CM | POA: Diagnosis not present

## 2016-03-27 MED ORDER — CEFTRIAXONE SODIUM 1 G IJ SOLR
1.0000 g | Freq: Once | INTRAMUSCULAR | Status: AC
  Administered 2016-03-27: 1 g via INTRAMUSCULAR

## 2016-03-27 NOTE — Telephone Encounter (Signed)
Faxed signed Rx for sling to [facility], per Dr Mitchel Honour. Confirmation page received at 3:28pm.

## 2016-03-27 NOTE — Patient Instructions (Addendum)
GO TO [facility] 03/28/16 tomorrow 325PM [address] Continue antibiotics.   IF you received an x-ray today, you will receive an invoice from [facility]. Please contact [facility] at [phone] with questions or concerns regarding your invoice.   IF you received labwork today, you will receive an invoice from [facility]. Please contact [facility] at [phone] with questions or concerns regarding your invoice.   Our billing staff will not be able to assist you with questions regarding bills from these companies.  You will be contacted with the lab results as soon as they are available. The fastest way to get your results is to activate your My Chart account. Instructions are located on the last page of this paperwork. If you have not heard from [place] regarding the results in 2 weeks, please contact this office.     Cellulitis, Adult Introduction Cellulitis is a skin infection. The infected area is usually red and sore. This condition occurs most often in the arms and lower legs. It is very important to get treated for this condition. Follow these instructions at home:  Take over-the-counter and prescription medicines only as told by your doctor.  If you were prescribed an antibiotic medicine, take it as told by your doctor. Do not stop taking the antibiotic even if you start to feel better.  Drink enough fluid to keep your pee (urine) clear or pale yellow.  Do not touch or rub the infected area.  Raise (elevate) the infected area above the level of your heart while you are sitting or lying down.  Place warm or cold wet cloths (warm or cold compresses) on the infected area. Do this as told by your doctor.  Keep all follow-up visits as told by your doctor. This is important. These visits let your doctor make sure your infection is not getting worse. Contact a doctor if:  You have a fever.  Your symptoms do not get better after 1-2 days of  treatment.  Your bone or joint under the infected area starts to hurt after the skin has healed.  Your infection comes back. This can happen in the same area or another area.  You have a swollen bump in the infected area.  You have new symptoms.  You feel ill and also have muscle aches and pains. Get help right away if:  Your symptoms get worse.  You feel very sleepy.  You throw up (vomit) or have watery poop (diarrhea) for a long time.  There are red streaks coming from the infected area.  Your red area gets larger.  Your red area turns darker. This information is not intended to replace advice given to you by your health care provider. Make sure you discuss any questions you have with your health care provider. Document Released: 07/05/2007 Document Revised: 06/24/2015 Document Reviewed: 11/25/2014  2017 Elsevier

## 2016-03-27 NOTE — Progress Notes (Signed)
Samuel Moyer 76 y.o.   Chief Complaint  Patient presents with  . Follow-up    recheck right elbow     HISTORY OF PRESENT ILLNESS: This is a 76 y.o. male here for recheck of right arm cellulitis; feels about 20% better. Still c/o swelling and redness.   HPI   Prior to Admission medications   Medication Sig Start Date End Date Taking? Authorizing Provider  amLODipine (NORVASC) 5 MG tablet Take 1.5 tablets (7.5 mg total) by mouth daily. 03/21/16  Yes Peter M Martinique, MD  atorvastatin (LIPITOR) 10 MG tablet TAKE 1 TABLET DAILY 01/25/16  Yes Binnie Rail, MD  benazepril (LOTENSIN) 40 MG tablet TAKE 1 TABLET DAILY 10/26/15  Yes Binnie Rail, MD  cephALEXin (KEFLEX) 500 MG capsule Take 1 capsule (500 mg total) by mouth 3 (three) times daily. 03/25/16 04/04/16 Yes Leonie Douglas, PA-C  dabigatran (PRADAXA) 150 MG CAPS capsule Take 1 capsule by mouth every 12 hours.  03/14/16  Yes Peter M Martinique, MD  doxycycline (VIBRAMYCIN) 100 MG capsule Take 1 capsule (100 mg total) by mouth 2 (two) times daily. 03/25/16  Yes Leonie Douglas, PA-C  furosemide (LASIX) 40 MG tablet TAKE 1 TABLET DAILY 02/28/16  Yes Peter M Martinique, MD  glucose blood (ONE TOUCH ULTRA TEST) test strip Check blood sugar daily as directed 03/05/15  Yes Binnie Rail, MD  metoprolol tartrate (LOPRESSOR) 25 MG tablet TAKE 2 TABLETS TWICE A DAY 03/13/16  Yes Peter M Martinique, MD  Multiple Vitamin (MULTIVITAMIN) tablet Take 1 tablet by mouth daily.     Yes Historical Provider, MD  [facility] DELICA LANCETS 99991111 MISC 1 each by Other route daily. Check blood sugar daily as directed 03/05/15  Yes Binnie Rail, MD  PARoxetine (PAXIL) 20 MG tablet TAKE ONE-HALF (1/2) TABLET DAILY 09/20/15  Yes Binnie Rail, MD    No Known Allergies  Patient Active Problem List   Diagnosis Date Noted  . Viral upper respiratory tract infection 02/14/2016  . Ingrown toenail 12/03/2015  . Gout attack 06/03/2015  . Prediabetes 03/05/2015  . Obesity 12/28/2014  .  Edema 04/07/2014  . Venous (peripheral) insufficiency 02/16/2014  . Erysipelas of lower extremity 02/02/2014  . OSA (obstructive sleep apnea) 05/24/2010  . ATRIAL FIBRILLATION  01/26/2010  . Chronic systolic heart failure ([place]) 01/26/2010  . Asthma 01/14/2010  . Hypercholesterolemia 10/21/2007  . Anxiety 10/21/2007  . Essential hypertension 02/26/2006  . COLONIC POLYPS, HX OF 02/26/2006    Past Medical History:  Diagnosis Date  . A-fib ([place])   . Anxiety   . Atrial fibrillation ([place])   . CHF (congestive heart failure) ([place])   . Claustrophobia    Occasionally when flying   . Colitis   . Diabetes mellitus, type 2 ([place])   . Gout   . Hemorrhoids   . Hyperlipidemia   . Hypertension   . LV dysfunction    EF 40-45%  . OSA (obstructive sleep apnea)    CPAP machine   . PVC's (premature ventricular contractions)     Past Surgical History:  Procedure Laterality Date  . CARDIOVASCULAR STRESS TEST  03/02/2010   EF 50%  . US ECHOCARDIOGRAPHY  11/15/2009   EF 40-45%    Social History   Social History  . Marital status: Married    Spouse name: [name]  . Number of children: 3  . Years of education: [name]   Occupational History  . Retired Retired    [employer]  Social History Main Topics  . Smoking status: Former Smoker    Packs/day: 1.00    Years: 16.00    Types: Cigarettes    Quit date: 06/30/1977  . Smokeless tobacco: Never Used  . Alcohol use No  . Drug use: No  . Sexual activity: Not on file   Other Topics Concern  . Not on file   Social History Narrative   2 caffeine drinks daily     Family History  Problem Relation Age of Onset  . Hypertension Father   . Heart attack Father     Age 71 (MI)  . Heart failure Father   . Colonic polyp Sister     and Father  . Stroke Mother   . Colon cancer Neg Hx   . Stomach cancer Neg Hx      Review of Systems  Constitutional: Negative for chills and fever.  Respiratory: Negative for shortness of breath.     Cardiovascular: Positive for palpitations (has h/o Afib). Negative for chest pain and leg swelling.  Gastrointestinal: Negative for nausea and vomiting.  Musculoskeletal: Negative for joint pain and myalgias.  Skin:       Redness and swelling right arm  Neurological: Negative for dizziness and headaches.  Endo/Heme/Allergies: Bruises/bleeds easily (on Pradaxa).   Vitals:   03/27/16 1406  BP: 138/80  Pulse: 83  Resp: 18  Temp: 97.4 F (36.3 C)     Physical Exam  Constitutional: He is oriented to person, place, and time. He appears well-developed and well-nourished.  HENT:  Head: Normocephalic and atraumatic.  Eyes: EOM are normal. Pupils are equal, round, and reactive to light.  Neck: Normal range of motion. Neck supple.  Cardiovascular: An irregularly irregular rhythm present.  Pulmonary/Chest: Effort normal.  Musculoskeletal:  RUE: +erythema and swelling of elbow and forearm without signs or symptoms of compartment syndrome; has good ROM but elbow flexion is limited due to swelling.  Neurological: He is alert and oriented to person, place, and time. No sensory deficit. He exhibits normal muscle tone.  Skin: Capillary refill takes less than 2 seconds.  See above  Psychiatric: He has a normal mood and affect. His behavior is normal.  Vitals reviewed.    ASSESSMENT & PLAN: Samuel Moyer was seen today for follow-up.  Diagnoses and all orders for this visit:  Cellulitis of right upper extremity -     cefTRIAXone (ROCEPHIN) injection 1 g; Inject 1 g into the muscle once. -     VAS [place] UPPER EXTREMITY VENOUS DUPLEX; Future -     US Venous Img Upper Uni Right -     Sling    Patient Instructions   GO TO  IMAGING 03/28/16 tomorrow 325PM [address] Continue antibiotics.   IF you received an x-ray today, you will receive an invoice from [facility]. Please contact [facility] at [phone] with questions or concerns regarding your invoice.    IF you received labwork today, you will receive an invoice from [facility]. Please contact [facility] at [phone] with questions or concerns regarding your invoice.   Our billing staff will not be able to assist you with questions regarding bills from these companies.  You will be contacted with the lab results as soon as they are available. The fastest way to get your results is to activate your My Chart account. Instructions are located on the last page of this paperwork. If you have not heard from [place] regarding the results in 2 weeks, please contact  this office.     Cellulitis, Adult Introduction Cellulitis is a skin infection. The infected area is usually red and sore. This condition occurs most often in the arms and lower legs. It is very important to get treated for this condition. Follow these instructions at home:  Take over-the-counter and prescription medicines only as told by your doctor.  If you were prescribed an antibiotic medicine, take it as told by your doctor. Do not stop taking the antibiotic even if you start to feel better.  Drink enough fluid to keep your pee (urine) clear or pale yellow.  Do not touch or rub the infected area.  Raise (elevate) the infected area above the level of your heart while you are sitting or lying down.  Place warm or cold wet cloths (warm or cold compresses) on the infected area. Do this as told by your doctor.  Keep all follow-up visits as told by your doctor. This is important. These visits let your doctor make sure your infection is not getting worse. Contact a doctor if:  You have a fever.  Your symptoms do not get better after 1-2 days of treatment.  Your bone or joint under the infected area starts to hurt after the skin has healed.  Your infection comes back. This can happen in the same area or another area.  You have a swollen bump in the infected area.  You have new symptoms.  You feel ill and also have muscle aches  and pains. Get help right away if:  Your symptoms get worse.  You feel very sleepy.  You throw up (vomit) or have watery poop (diarrhea) for a long time.  There are red streaks coming from the infected area.  Your red area gets larger.  Your red area turns darker. This information is not intended to replace advice given to you by your health care provider. Make sure you discuss any questions you have with your health care provider. Document Released: 07/05/2007 Document Revised: 06/24/2015 Document Reviewed: 11/25/2014  2017 Elsevier      Agustina Caroli, MD [facility]

## 2016-03-28 ENCOUNTER — Ambulatory Visit
Admission: RE | Admit: 2016-03-28 | Discharge: 2016-03-28 | Disposition: A | Discharge status: Home / Self Care | Payer: Medicare Other | Source: Ambulatory Visit | Attending: Emergency Medicine | Admitting: Emergency Medicine

## 2016-03-28 DIAGNOSIS — L03113 Cellulitis of right upper limb: Secondary | ICD-10-CM | POA: Diagnosis not present

## 2016-04-06 ENCOUNTER — Ambulatory Visit (INDEPENDENT_AMBULATORY_CARE_PROVIDER_SITE_OTHER): Payer: Medicare Other | Admitting: Emergency Medicine

## 2016-04-06 VITALS — BP 138/78 | HR 55 | Temp 98.1°F | Resp 16 | Ht 67.0 in | Wt 228.8 lb

## 2016-04-06 DIAGNOSIS — M25521 Pain in right elbow: Secondary | ICD-10-CM

## 2016-04-06 DIAGNOSIS — L03113 Cellulitis of right upper limb: Secondary | ICD-10-CM | POA: Diagnosis not present

## 2016-04-06 DIAGNOSIS — M7031 Other bursitis of elbow, right elbow: Secondary | ICD-10-CM | POA: Diagnosis not present

## 2016-04-06 NOTE — Progress Notes (Signed)
Samuel Moyer. 76 y.o.   Chief Complaint  Patient presents with  . Follow-up    right arm, "better" , but still sore at the elbow    HISTORY OF PRESENT ILLNESS: This is a 76 y.o. male here for f/u of right elbow/arm cellulitis; had negative RUE U/S done; finished with antibiotics; almost 100% better; still "a little sore" in the elbow but recovered 100% ROM. No new complaints. HPI   Prior to Admission medications   Medication Sig Start Date End Date Taking? Authorizing Provider  amLODipine (NORVASC) 5 MG tablet Take 1.5 tablets (7.5 mg total) by mouth daily. 03/21/16  Yes Peter M Martinique, MD  atorvastatin (LIPITOR) 10 MG tablet TAKE 1 TABLET DAILY 01/25/16  Yes Binnie Rail, MD  benazepril (LOTENSIN) 40 MG tablet TAKE 1 TABLET DAILY 10/26/15  Yes Binnie Rail, MD  dabigatran (PRADAXA) 150 MG CAPS capsule Take 1 capsule by mouth every 12 hours.  03/14/16  Yes Peter M Martinique, MD  furosemide (LASIX) 40 MG tablet TAKE 1 TABLET DAILY 02/28/16  Yes Peter M Martinique, MD  glucose blood (ONE TOUCH ULTRA TEST) test strip Check blood sugar daily as directed 03/05/15  Yes Binnie Rail, MD  metoprolol tartrate (LOPRESSOR) 25 MG tablet TAKE 2 TABLETS TWICE A DAY 03/13/16  Yes Peter M Martinique, MD  Multiple Vitamin (MULTIVITAMIN) tablet Take 1 tablet by mouth daily.     Yes Historical Provider, MD  [facility] DELICA LANCETS 12X MISC 1 each by Other route daily. Check blood sugar daily as directed 03/05/15  Yes Binnie Rail, MD  PARoxetine (PAXIL) 20 MG tablet TAKE ONE-HALF (1/2) TABLET DAILY 09/20/15  Yes Binnie Rail, MD    No Known Allergies  Patient Active Problem List   Diagnosis Date Noted  . Cellulitis of right upper extremity 03/27/2016  . Viral upper respiratory tract infection 02/14/2016  . Ingrown toenail 12/03/2015  . Gout attack 06/03/2015  . Prediabetes 03/05/2015  . Obesity 12/28/2014  . Edema 04/07/2014  . Venous (peripheral) insufficiency 02/16/2014  . Erysipelas of lower extremity  02/02/2014  . OSA (obstructive sleep apnea) 05/24/2010  . ATRIAL FIBRILLATION  01/26/2010  . Chronic systolic heart failure ([place]) 01/26/2010  . Asthma 01/14/2010  . Hypercholesterolemia 10/21/2007  . Anxiety 10/21/2007  . Essential hypertension 02/26/2006  . COLONIC POLYPS, HX OF 02/26/2006    Past Medical History:  Diagnosis Date  . A-fib ([place])   . Anxiety   . Atrial fibrillation ([place])   . CHF (congestive heart failure) ([place])   . Claustrophobia    Occasionally when flying   . Colitis   . Diabetes mellitus, type 2 ([place])   . Gout   . Hemorrhoids   . Hyperlipidemia   . Hypertension   . LV dysfunction    EF 40-45%  . OSA (obstructive sleep apnea)    CPAP machine   . PVC's (premature ventricular contractions)     Past Surgical History:  Procedure Laterality Date  . CARDIOVASCULAR STRESS TEST  03/02/2010   EF 50%  . US ECHOCARDIOGRAPHY  11/15/2009   EF 40-45%    Social History   Social History  . Marital status: Married    Spouse name: [name]  . Number of children: 3  . Years of education: [name]   Occupational History  . Retired Retired    [employer]    Social History Main Topics  . Smoking status: Former Smoker    Packs/day: 1.00    Years:  16.00    Types: Cigarettes    Quit date: 06/30/1977  . Smokeless tobacco: Never Used  . Alcohol use No  . Drug use: No  . Sexual activity: Not on file   Other Topics Concern  . Not on file   Social History Narrative   2 caffeine drinks daily     Family History  Problem Relation Age of Onset  . Hypertension Father   . Heart attack Father     Age 49 (MI)  . Heart failure Father   . Colonic polyp Sister     and Father  . Stroke Mother   . Colon cancer Neg Hx   . Stomach cancer Neg Hx      Review of Systems  Constitutional: Negative for chills and fever.  Respiratory: Negative for shortness of breath.   Gastrointestinal: Negative for abdominal pain, nausea and vomiting.  Musculoskeletal: Negative for joint  pain and myalgias.  Skin: Negative for rash.  Neurological: Negative for dizziness and headaches.  All other systems reviewed and are negative.  Vitals:   04/06/16 0822  BP: 138/78  Pulse: (!) 55  Resp: 16  Temp: 98.1 F (36.7 C)    Physical Exam  Constitutional: He is oriented to person, place, and time. He appears well-developed.  HENT:  Head: Normocephalic and atraumatic.  Eyes: EOM are normal. Pupils are equal, round, and reactive to light.  Neck: Normal range of motion. Neck supple.  Cardiovascular: Normal rate.   Pulmonary/Chest: Effort normal.  Musculoskeletal:  Right UE : no erythema or swelling; FROM, NVI; rest of extr WNL.  Neurological: He is alert and oriented to person, place, and time. No sensory deficit. He exhibits normal muscle tone.  Skin: Skin is warm and dry. Capillary refill takes less than 2 seconds. No rash noted.  Psychiatric: He has a normal mood and affect. His behavior is normal.  Vitals reviewed.    ASSESSMENT & PLAN: Kennett was seen today for follow-up.  Diagnoses and all orders for this visit:  Cellulitis of right upper extremity Comments: resolved  Right elbow pain Comments: resolved  Bursitis of right elbow, unspecified bursa Comments: much improved      Agustina Caroli, MD [facility]

## 2016-04-06 NOTE — Patient Instructions (Signed)
     IF you received an x-ray today, you will receive an invoice from [facility]. Please contact [facility] at [phone] with questions or concerns regarding your invoice.   IF you received labwork today, you will receive an invoice from [facility]. Please contact [facility] at [phone] with questions or concerns regarding your invoice.   Our billing staff will not be able to assist you with questions regarding bills from these companies.  You will be contacted with the lab results as soon as they are available. The fastest way to get your results is to activate your My Chart account. Instructions are located on the last page of this paperwork. If you have not heard from us regarding the results in 2 weeks, please contact this office.     

## 2016-05-01 DIAGNOSIS — Z974 Presence of external hearing-aid: Secondary | ICD-10-CM | POA: Diagnosis not present

## 2016-05-01 DIAGNOSIS — H903 Sensorineural hearing loss, bilateral: Secondary | ICD-10-CM | POA: Diagnosis not present

## 2016-05-01 DIAGNOSIS — H6123 Impacted cerumen, bilateral: Secondary | ICD-10-CM | POA: Diagnosis not present

## 2016-05-01 DIAGNOSIS — Z87891 Personal history of nicotine dependence: Secondary | ICD-10-CM | POA: Diagnosis not present

## 2016-05-22 DIAGNOSIS — H903 Sensorineural hearing loss, bilateral: Secondary | ICD-10-CM | POA: Diagnosis not present

## 2016-05-31 ENCOUNTER — Encounter: Payer: Medicare Other | Admitting: Internal Medicine

## 2016-05-31 NOTE — Progress Notes (Signed)
Pre visit review using our clinic review tool, if applicable. No additional management support is needed unless otherwise documented below in the visit note. 

## 2016-05-31 NOTE — Progress Notes (Signed)
Subjective:   Samuel Moyer. is a 76 y.o. male who presents for an Initial Medicare Annual Wellness Visit.  Review of Systems  No ROS.  Medicare Wellness Visit. Cardiac Risk Factors include: dyslipidemia;diabetes mellitus;advanced age (>1men, >27 women);hypertension;male gender Sleep patterns: feels rested on waking, does not get up to void and sleeps 6-8 hours nightly.  Wears C-PAP nightly Home Safety/Smoke Alarms: Feels safe in home. Smoke alarms in place.    Living environment; residence and Firearm Safety: 2-story house, no firearms. Lives with wife, no needs for DME Seat Belt Safety/Bike Helmet: Wears seat belt.   Counseling:   Eye Exam- appointment yearly  Dental- appointment every 6 months  Male:   [facility]- Last 03/30/11, diverticulosis, hemorrhoids, recall 5 years, patient states he will make an appointment but first wants to call Medicare to see about coverage. Adding that his friend was charged by Medicare at the age of 46    PSA- Lab Results  Component Value Date   PSA 0.63 09/12/2007   PSA 0.74 08/27/2006      Objective:    Today's Vitals   06/01/16 0835 06/01/16 0916  BP: (!) 150/90 (!) 148/88  Pulse: 63   Resp: 16   Temp: 97.7 F (36.5 C)   TempSrc: Oral   SpO2: 96%   Weight: 231 lb (104.8 kg)    Body mass index is 36.18 kg/m.  Current Medications (verified) Outpatient Encounter Prescriptions as of 06/01/2016  Medication Sig  . amLODipine (NORVASC) 5 MG tablet Take 1.5 tablets (7.5 mg total) by mouth daily.  Marland Kitchen atorvastatin (LIPITOR) 10 MG tablet TAKE 1 TABLET DAILY  . benazepril (LOTENSIN) 40 MG tablet TAKE 1 TABLET DAILY  . dabigatran (PRADAXA) 150 MG CAPS capsule Take 1 capsule by mouth every 12 hours.   . furosemide (LASIX) 40 MG tablet TAKE 1 TABLET DAILY  . glucose blood (ONE TOUCH ULTRA TEST) test strip Check blood sugar daily prn as directed  . metoprolol tartrate (LOPRESSOR) 25 MG tablet TAKE 2 TABLETS TWICE A DAY  . Multiple Vitamin  (MULTIVITAMIN) tablet Take 1 tablet by mouth daily.    [name] DELICA LANCETS 93X MISC 1 each by Other route daily. Check blood sugar daily as directed  . PARoxetine (PAXIL) 20 MG tablet TAKE ONE-HALF (1/2) TABLET DAILY  . [DISCONTINUED] glucose blood (ONE TOUCH ULTRA TEST) test strip Check blood sugar daily as directed   No facility-administered encounter medications on file as of 06/01/2016.     Allergies (verified) Patient has no known allergies.   History: Past Medical History:  Diagnosis Date  . A-fib ([place])   . Anxiety   . Atrial fibrillation ([place])   . CHF (congestive heart failure) ([place])   . Claustrophobia    Occasionally when flying   . Colitis   . Diabetes mellitus, type 2 ([place])   . Gout   . Hemorrhoids   . Hyperlipidemia   . Hypertension   . LV dysfunction    EF 40-45%  . OSA (obstructive sleep apnea)    CPAP machine   . PVC's (premature ventricular contractions)    Past Surgical History:  Procedure Laterality Date  . CARDIOVASCULAR STRESS TEST  03/02/2010   EF 50%  . US ECHOCARDIOGRAPHY  11/15/2009   EF 40-45%   Family History  Problem Relation Age of Onset  . Hypertension Father   . Heart attack Father     Age 12 (MI)  . Heart failure Father   . Colonic polyp  Sister     and Father  . Stroke Mother   . Colon cancer Neg Hx   . Stomach cancer Neg Hx    Social History   Occupational History  . Retired Retired    [employer]    Social History Main Topics  . Smoking status: Former Smoker    Packs/day: 1.00    Years: 16.00    Types: Cigarettes    Quit date: 06/30/1977  . Smokeless tobacco: Never Used  . Alcohol use No  . Drug use: No  . Sexual activity: Not on file   Tobacco Counseling Counseling given: Not Answered   Activities of Daily Living In your present state of health, do you have any difficulty performing the following activities: 06/01/2016 03/25/2016  Hearing? N Y  Vision? N N  Difficulty concentrating or making decisions? N N    Walking or climbing stairs? N N  Dressing or bathing? N N  Doing errands, shopping? N N  Preparing Food and eating ? N -  Using the Toilet? N -  In the past six months, have you accidently leaked urine? N -  Do you have problems with loss of bowel control? N -  Managing your Medications? N -  Managing your Finances? N -  Housekeeping or managing your Housekeeping? N -  Some recent data might be hidden    Immunizations and Health Maintenance Immunization History  Administered Date(s) Administered  . Influenza Split 09/30/2013  . Influenza Whole 10/21/2007, 10/31/2010, 10/31/2011  . Influenza-Unspecified 09/30/2012, 11/24/2014, 11/18/2015  . Pneumococcal Conjugate-13 03/05/2015  . Pneumococcal-Unspecified 01/21/2010  . Tdap 12/03/2015  . Zoster 03/25/2007   There are no preventive care reminders to display for this patient.  Patient Care Team: Binnie Rail, MD as PCP - General (Internal Medicine) Peter M Martinique, MD as Referring Physician (Cardiology)  Indicate any recent Medical Services you may have received from other than [facility] providers in the past year (date may be approximate).    Assessment:   This is a routine wellness examination for Samuel Moyer.Physical assessment deferred to PCP.   Hearing/Vision screen Hearing Screening Comments: HOH, hearing aides, routine audiology appointments Vision Screening Comments: Wears glasses  Dietary issues and exercise activities discussed: Current Exercise Habits: The patient does not participate in regular exercise at present, Exercise limited by: None identified Discussed only using the steps at work, walking in the neighborhood, doing yard work, and going to [employer] with his wife.   Diet (meal preparation, eat out, water intake, caffeinated beverages, dairy products, fruits and vegetables): in general, a "healthy" diet  , well balanced Patient reports eating out around 50% of the time, drinks 5-6 diet cokes daily, 1-2 glasses of water  daily, eats vegetable salads several times weekly, attempts to eat a variety of vegetables and fruits daily.  Reviewed low salt, low fat/cholesterol, and diabetic diet. Encouraged patient to increase daily water intake and decrease daily diet coke intake. Discussed nutritional strategies for weight loss such as portion control, drinking a glass of water before meal, using smaller plates, eat slowly, good food choices when eating out, and reading labels. Diet education was provided to patient via handouts.  Goals    . lose 10 pounds          Start walk in the neighborhood and go to the [facility], cut down on starches, use the steps [place] the ballpark.       Depression Screen PHQ 2/9 Scores 06/01/2016 04/06/2016 03/27/2016 03/25/2016  PHQ -  2 Score 0 0 0 0  PHQ- 9 Score 0 - - -    Fall Risk Fall Risk  06/01/2016 04/06/2016 03/27/2016 03/25/2016 06/23/2015  Falls in the past year? No No No No No  Number falls in past yr: - - - - -  Injury with Fall? - - - - -    Cognitive Function:        Screening Tests Health Maintenance  Topic Date Due  . INFLUENZA VACCINE  08/30/2016  . TETANUS/TDAP  12/02/2025  . PNA vac Low Risk Adult  Completed        Plan:    Start to eat heart healthy diet (full of fruits, vegetables, whole grains, lean protein, water--limit salt, fat, and sugar intake) and increase physical activity as tolerated, increase daily water intake.  Continue doing brain stimulating activities (puzzles, reading, adult coloring books, staying active) to keep memory sharp.   Patient will begin to wear compression socks daily. He will elevate feet and legs at night and during sleeping hours as much as possible.  I have personally reviewed and noted the following in the patient's chart:   . Medical and social history . Use of alcohol, tobacco or illicit drugs  . Current medications and supplements . Functional ability and status . Nutritional status . Physical activity . Advanced  directives . List of other physicians . Hospitalizations, surgeries, and ER visits in previous 12 months . Vitals . Screenings to include cognitive, depression, and falls . Referrals and appointments  In addition, I have reviewed and discussed with patient certain preventive protocols, quality metrics, and best practice recommendations. A written personalized care plan for preventive services as well as general preventive health recommendations were provided to patient.     Michiel Cowboy, RN   06/01/2016      Medical screening examination/treatment/procedure(s) were performed by non-physician practitioner and as supervising physician I was immediately available for consultation/collaboration. I agree with above. Binnie Rail, MD

## 2016-06-01 ENCOUNTER — Other Ambulatory Visit (INDEPENDENT_AMBULATORY_CARE_PROVIDER_SITE_OTHER): Payer: Medicare Other

## 2016-06-01 ENCOUNTER — Encounter: Payer: Self-pay | Admitting: Internal Medicine

## 2016-06-01 ENCOUNTER — Ambulatory Visit (INDEPENDENT_AMBULATORY_CARE_PROVIDER_SITE_OTHER): Payer: Medicare Other | Admitting: Internal Medicine

## 2016-06-01 VITALS — BP 148/88 | HR 63 | Temp 97.7°F | Resp 16 | Wt 231.0 lb

## 2016-06-01 DIAGNOSIS — Z6836 Body mass index (BMI) 36.0-36.9, adult: Secondary | ICD-10-CM | POA: Diagnosis not present

## 2016-06-01 DIAGNOSIS — I482 Chronic atrial fibrillation, unspecified: Secondary | ICD-10-CM

## 2016-06-01 DIAGNOSIS — F419 Anxiety disorder, unspecified: Secondary | ICD-10-CM | POA: Diagnosis not present

## 2016-06-01 DIAGNOSIS — Z0000 Encounter for general adult medical examination without abnormal findings: Secondary | ICD-10-CM

## 2016-06-01 DIAGNOSIS — I10 Essential (primary) hypertension: Secondary | ICD-10-CM

## 2016-06-01 DIAGNOSIS — I5022 Chronic systolic (congestive) heart failure: Secondary | ICD-10-CM

## 2016-06-01 DIAGNOSIS — I872 Venous insufficiency (chronic) (peripheral): Secondary | ICD-10-CM

## 2016-06-01 DIAGNOSIS — E7800 Pure hypercholesterolemia, unspecified: Secondary | ICD-10-CM

## 2016-06-01 DIAGNOSIS — R7303 Prediabetes: Secondary | ICD-10-CM

## 2016-06-01 DIAGNOSIS — H401131 Primary open-angle glaucoma, bilateral, mild stage: Secondary | ICD-10-CM | POA: Diagnosis not present

## 2016-06-01 LAB — HEMOGLOBIN A1C: HEMOGLOBIN A1C: 6.2 % (ref 4.6–6.5)

## 2016-06-01 LAB — CBC WITH DIFFERENTIAL/PLATELET
BASOS ABS: 0.1 10*3/uL (ref 0.0–0.1)
BASOS PCT: 0.6 % (ref 0.0–3.0)
Eosinophils Absolute: 0.2 10*3/uL (ref 0.0–0.7)
Eosinophils Relative: 2.1 % (ref 0.0–5.0)
HCT: 43.8 % (ref 39.0–52.0)
Hemoglobin: 14.5 g/dL (ref 13.0–17.0)
LYMPHS ABS: 4 10*3/uL (ref 0.7–4.0)
Lymphocytes Relative: 45.5 % (ref 12.0–46.0)
MCHC: 33.1 g/dL (ref 30.0–36.0)
MCV: 95.6 fl (ref 78.0–100.0)
MONOS PCT: 10.3 % (ref 3.0–12.0)
Monocytes Absolute: 0.9 10*3/uL (ref 0.1–1.0)
NEUTROS ABS: 3.6 10*3/uL (ref 1.4–7.7)
Neutrophils Relative %: 41.5 % — ABNORMAL LOW (ref 43.0–77.0)
Platelets: 207 10*3/uL (ref 150.0–400.0)
RBC: 4.58 Mil/uL (ref 4.22–5.81)
RDW: 16.6 % — AB (ref 11.5–15.5)
WBC: 8.7 10*3/uL (ref 4.0–10.5)

## 2016-06-01 LAB — LIPID PANEL
CHOLESTEROL: 170 mg/dL (ref 0–200)
HDL: 60 mg/dL (ref >39.00)
LDL Cholesterol: 94 mg/dL (ref 0–99)
NonHDL: 110.01
Total CHOL/HDL Ratio: 3
Triglycerides: 79 mg/dL (ref 0.0–149.0)
VLDL: 15.8 mg/dL (ref 0.0–40.0)

## 2016-06-01 LAB — COMPREHENSIVE METABOLIC PANEL
ALBUMIN: 4.5 g/dL (ref 3.5–5.2)
ALT: 20 U/L (ref 0–53)
AST: 25 U/L (ref 0–37)
Alkaline Phosphatase: 64 U/L (ref 39–117)
BUN: 18 mg/dL (ref 6–23)
CALCIUM: 10 mg/dL (ref 8.4–10.5)
CHLORIDE: 103 meq/L (ref 96–112)
CO2: 30 mEq/L (ref 19–32)
CREATININE: 1.1 mg/dL (ref 0.40–1.50)
GFR: 69.03 mL/min (ref >60.00)
Glucose, Bld: 119 mg/dL — ABNORMAL HIGH (ref 70–99)
POTASSIUM: 4.3 meq/L (ref 3.5–5.1)
Sodium: 140 mEq/L (ref 135–145)
Total Bilirubin: 0.8 mg/dL (ref 0.2–1.2)
Total Protein: 7.4 g/dL (ref 6.0–8.3)

## 2016-06-01 LAB — TSH: TSH: 1.3 u[IU]/mL (ref 0.35–4.50)

## 2016-06-01 MED ORDER — GLUCOSE BLOOD VI STRP: ORAL_STRIP | 0 refills | Status: DC

## 2016-06-01 NOTE — Assessment & Plan Note (Signed)
Controlled, stable Continue current dose of medication  

## 2016-06-01 NOTE — Patient Instructions (Addendum)
  Begin to wear compression socks daily. Elevate your feet and legs at night and during sleeping hours as much as possible.  Start to eat heart healthy diet (full of fruits, vegetables, whole grains, lean protein, water--limit salt, fat, and sugar intake) and increase physical activity as tolerated, increase daily water intake.  Continue doing brain stimulating activities (puzzles, reading, adult coloring books, staying active) to keep memory sharp.   Samuel Moyer , Thank you for taking time to come for your Medicare Wellness Visit. I appreciate your ongoing commitment to your health goals. Please review the following plan we discussed and let me know if I can assist you in the future.   These are the goals we discussed: Goals    . lose 10 pounds          Start walk in the neighborhood and go to the [facility], cut down on starches, use the steps [place] the ballpark.        This is a list of the screening recommended for you and due dates:  Health Maintenance  Topic Date Due  . Flu Shot  08/30/2016  . Tetanus Vaccine  12/02/2025  . Pneumonia vaccines  Completed      Have blood work done today.   No change in medications.

## 2016-06-01 NOTE — Assessment & Plan Note (Signed)
Check a1c Low sugar / carb diet Stressed regular exercise, weight loss  

## 2016-06-01 NOTE — Progress Notes (Signed)
Subjective:    Patient ID: Samuel Mcisaac., male    DOB: [birth year], 76 y.o.   MRN: [id number]  HPI The patient is here for follow up.  Afib, , chronic systolic HF, Hypertension, venous insufficiency: He is taking his medication daily. He is not compliant with a low sodium diet.  He has had increased edema in his legs L> R.  He has SOB with yard work and stairs.  He denies chest pain, palpitations and regular headaches. He is not exercising regularly, outside of yard work and working at the [employer].  He does monitor his blood pressure at home on occasion, but he gets error messages due to being in Afib.     Prediabetes:  He is compliant with a low sugar/carbohydrate diet.  He is not exercising regularly.  He checks his sugars occasionally and it is aroudn 120.  Hyperlipidemia: He is taking his medication daily. He is compliant with a low fat/cholesterol diet. He is not exercising regularly. He denies myalgias.   Anxiety: He is taking his medication daily as prescribed. He denies any side effects from the medication. He feels his anxiety is well controlled and he is happy with his current dose of medication.    Medications and allergies reviewed with patient and updated if appropriate.  Patient Active Problem List   Diagnosis Date Noted  . Gout attack 06/03/2015  . Prediabetes 03/05/2015  . Obesity 12/28/2014  . Venous (peripheral) insufficiency 02/16/2014  . OSA (obstructive sleep apnea) 05/24/2010  . ATRIAL FIBRILLATION  01/26/2010  . Chronic systolic heart failure ([place]) 01/26/2010  . Asthma 01/14/2010  . Hypercholesterolemia 10/21/2007  . Anxiety 10/21/2007  . Essential hypertension 02/26/2006  . COLONIC POLYPS, HX OF 02/26/2006    Current Outpatient Prescriptions on File Prior to Visit  Medication Sig Dispense Refill  . amLODipine (NORVASC) 5 MG tablet Take 1.5 tablets (7.5 mg total) by mouth daily. 120 tablet 3  . atorvastatin (LIPITOR) 10 MG tablet TAKE 1 TABLET DAILY  90 tablet 2  . benazepril (LOTENSIN) 40 MG tablet TAKE 1 TABLET DAILY 90 tablet 2  . dabigatran (PRADAXA) 150 MG CAPS capsule Take 1 capsule by mouth every 12 hours.  180 capsule 0  . furosemide (LASIX) 40 MG tablet TAKE 1 TABLET DAILY 90 tablet 3  . metoprolol tartrate (LOPRESSOR) 25 MG tablet TAKE 2 TABLETS TWICE A DAY 360 tablet 3  . Multiple Vitamin (MULTIVITAMIN) tablet Take 1 tablet by mouth daily.      [name] DELICA LANCETS 62B MISC 1 each by Other route daily. Check blood sugar daily as directed 100 each 0  . PARoxetine (PAXIL) 20 MG tablet TAKE ONE-HALF (1/2) TABLET DAILY 45 tablet 2   No current facility-administered medications on file prior to visit.     Past Medical History:  Diagnosis Date  . A-fib ([place])   . Anxiety   . Atrial fibrillation ([place])   . CHF (congestive heart failure) ([place])   . Claustrophobia    Occasionally when flying   . Colitis   . Diabetes mellitus, type 2 ([place])   . Gout   . Hemorrhoids   . Hyperlipidemia   . Hypertension   . LV dysfunction    EF 40-45%  . OSA (obstructive sleep apnea)    CPAP machine   . PVC's (premature ventricular contractions)     Past Surgical History:  Procedure Laterality Date  . CARDIOVASCULAR STRESS TEST  03/02/2010   EF 50%  . US  ECHOCARDIOGRAPHY  11/15/2009   EF 40-45%    Social History   Social History  . Marital status: Married    Spouse name: [name]  . Number of children: 3  . Years of education: [name]   Occupational History  . Retired Retired    [employer]    Social History Main Topics  . Smoking status: Former Smoker    Packs/day: 1.00    Years: 16.00    Types: Cigarettes    Quit date: 06/30/1977  . Smokeless tobacco: Never Used  . Alcohol use No  . Drug use: No  . Sexual activity: Not Asked   Other Topics Concern  . None   Social History Narrative   2 caffeine drinks daily     Family History  Problem Relation Age of Onset  . Hypertension Father   . Heart attack Father     Age 69  (MI)  . Heart failure Father   . Colonic polyp Sister     and Father  . Stroke Mother   . Colon cancer Neg Hx   . Stomach cancer Neg Hx     Review of Systems  Constitutional: Negative for chills and fever.  Respiratory: Negative for cough and shortness of breath.   Cardiovascular: Positive for leg swelling. Negative for chest pain and palpitations.  Neurological: Negative for light-headedness and headaches.       Objective:   Vitals:   06/01/16 0835 06/01/16 0916  BP: (!) 150/90 (!) 148/88  Pulse: 63   Resp: 16   Temp: 97.7 F (36.5 C)    Wt Readings from Last 3 Encounters:  06/01/16 231 lb (104.8 kg)  04/06/16 228 lb 12.8 oz (103.8 kg)  03/27/16 227 lb 6.4 oz (103.1 kg)   Body mass index is 36.18 kg/m.   Physical Exam    Constitutional: Appears well-developed and well-nourished. No distress.  HENT:  Head: Normocephalic and atraumatic.  Neck: Neck supple. No tracheal deviation present. No thyromegaly present.  No cervical lymphadenopathy Cardiovascular: Normal rate, irregular rhythm and normal heart sounds.   No murmur heard. No carotid bruit .  2+ pitting edema LLE, 1+pitting RLE edema Pulmonary/Chest: Effort normal and breath sounds normal. No respiratory distress. No has no wheezes. No rales.  Skin: Skin is warm and dry. Not diaphoretic.  Psychiatric: Normal mood and affect. Behavior is normal.      Assessment & Plan:    See Problem List for Assessment and Plan of chronic medical problems.

## 2016-06-01 NOTE — Assessment & Plan Note (Signed)
Check lipid panel  Continue daily statin Regular exercise and healthy diet encouraged  

## 2016-06-01 NOTE — Assessment & Plan Note (Addendum)
Bp elevated here today Will recheck Will increase exercise and work on weight loss Monitor at home ideally - may need additional medication cmp

## 2016-06-01 NOTE — Progress Notes (Signed)
Pre visit review using our clinic review tool, if applicable. No additional management support is needed unless otherwise documented below in the visit note. 

## 2016-06-01 NOTE — Assessment & Plan Note (Signed)
Increased edema L> R Decrease sodium intake Work on weight loss Start regular exercise Start using compression socks when working at the [employer] off on changing lasix dose

## 2016-06-01 NOTE — Assessment & Plan Note (Signed)
Rate controlled On pradaxa, metoprolol

## 2016-06-01 NOTE — Assessment & Plan Note (Signed)
Stressed weight loss- increase exercise and decrease portions

## 2016-06-01 NOTE — Assessment & Plan Note (Signed)
Increased edema in legs, but not evidence of decompensated heart failure No change in lasix

## 2016-06-03 ENCOUNTER — Encounter: Payer: Self-pay | Admitting: Internal Medicine

## 2016-06-12 ENCOUNTER — Other Ambulatory Visit: Payer: Self-pay | Admitting: Cardiology

## 2016-06-16 ENCOUNTER — Other Ambulatory Visit: Payer: Self-pay | Admitting: Internal Medicine

## 2016-06-23 DIAGNOSIS — H401131 Primary open-angle glaucoma, bilateral, mild stage: Secondary | ICD-10-CM | POA: Diagnosis not present

## 2016-07-22 ENCOUNTER — Other Ambulatory Visit: Payer: Self-pay | Admitting: Internal Medicine

## 2016-10-20 ENCOUNTER — Other Ambulatory Visit: Payer: Self-pay | Admitting: Internal Medicine

## 2016-10-25 DIAGNOSIS — H401131 Primary open-angle glaucoma, bilateral, mild stage: Secondary | ICD-10-CM | POA: Diagnosis not present

## 2016-10-25 DIAGNOSIS — H2513 Age-related nuclear cataract, bilateral: Secondary | ICD-10-CM | POA: Diagnosis not present

## 2016-10-25 DIAGNOSIS — H25013 Cortical age-related cataract, bilateral: Secondary | ICD-10-CM | POA: Diagnosis not present

## 2016-11-23 DIAGNOSIS — Z23 Encounter for immunization: Secondary | ICD-10-CM | POA: Diagnosis not present

## 2016-12-03 NOTE — Patient Instructions (Addendum)

## 2016-12-03 NOTE — Progress Notes (Signed)
Subjective:    Patient ID: Samuel Moyer., male    DOB: 05-20-1940, 76 y.o.   MRN: [id number]  HPI The patient is here for follow up.  Afib, Hypertension: He is taking his medication daily. He is compliant with a low sodium diet.  He denies chest pain, palpitations, edema, shortness of breath and regular headaches. He is not exercising regularly.  He does monitor his blood pressure at home - 145-153/92.    Prediabetes:  He is compliant with a low sugar/carbohydrate diet.  He is not exercising regularly.  Hyperlipidemia: He is taking his medication daily. He is compliant with a low fat/cholesterol diet. He is not exercising regularly. He denies myalgias.   He states he is eating pretty good, but also states he eats out a lot.    Anxiety: He is taking his medication daily as prescribed. He denies any side effects from the medication. He feels his anxiety is well controlled and he is happy with his current dose of medication.   He has mild runny nose in the morning, which is normal.  Yesterday he has some blood in his mucus.  He had no blood today.     Medications and allergies reviewed with patient and updated if appropriate.  Patient Active Problem List   Diagnosis Date Noted  . Gout attack 06/03/2015  . Prediabetes 03/05/2015  . Obesity 12/28/2014  . Venous (peripheral) insufficiency 02/16/2014  . OSA (obstructive sleep apnea) 05/24/2010  . ATRIAL FIBRILLATION  01/26/2010  . Chronic systolic heart failure ([place]) 01/26/2010  . Asthma 01/14/2010  . Hypercholesterolemia 10/21/2007  . Anxiety 10/21/2007  . Essential hypertension 02/26/2006  . COLONIC POLYPS, HX OF 02/26/2006    Current Outpatient Medications on File Prior to Visit  Medication Sig Dispense Refill  . amLODipine (NORVASC) 5 MG tablet Take 1.5 tablets (7.5 mg total) by mouth daily. 120 tablet 3  . atorvastatin (LIPITOR) 10 MG tablet TAKE 1 TABLET DAILY 90 tablet 2  . benazepril (LOTENSIN) 40 MG tablet TAKE 1  TABLET DAILY 90 tablet 3  . furosemide (LASIX) 40 MG tablet TAKE 1 TABLET DAILY 90 tablet 3  . glucose blood (ONE TOUCH ULTRA TEST) test strip Check blood sugar daily prn as directed 100 each 0  . metoprolol tartrate (LOPRESSOR) 25 MG tablet TAKE 2 TABLETS TWICE A DAY 360 tablet 3  . Multiple Vitamin (MULTIVITAMIN) tablet Take 1 tablet by mouth daily.      [name] DELICA LANCETS 44Y MISC 1 each by Other route daily. Check blood sugar daily as directed 100 each 0  . PARoxetine (PAXIL) 20 MG tablet TAKE ONE-HALF (1/2) TABLET DAILY 45 tablet 1  . PRADAXA 150 MG CAPS capsule TAKE 1 CAPSULE EVERY 12 HOURS 180 capsule 1   No current facility-administered medications on file prior to visit.     Past Medical History:  Diagnosis Date  . A-fib ([place])   . Anxiety   . Atrial fibrillation ([place])   . CHF (congestive heart failure) ([place])   . Claustrophobia    Occasionally when flying   . Colitis   . Diabetes mellitus, type 2 ([place])   . Gout   . Hemorrhoids   . Hyperlipidemia   . Hypertension   . LV dysfunction    EF 40-45%  . OSA (obstructive sleep apnea)    CPAP machine   . PVC's (premature ventricular contractions)     Past Surgical History:  Procedure Laterality Date  . CARDIOVASCULAR STRESS TEST  03/02/2010   EF 50%  . US ECHOCARDIOGRAPHY  11/15/2009   EF 40-45%    Social History   Socioeconomic History  . Marital status: Married    Spouse name: None  . Number of children: 3  . Years of education: None  . Highest education level: None  Social Needs  . Financial resource strain: None  . Food insecurity - worry: None  . Food insecurity - inability: None  . Transportation needs - medical: None  . Transportation needs - non-medical: None  Occupational History  . Occupation: Retired    Fish farm manager: RETIRED    Comment: [employer]   Tobacco Use  . Smoking status: Former Smoker    Packs/day: 1.00    Years: 16.00    Pack years: 16.00    Types: Cigarettes    Last attempt to  quit: 06/30/1977    Years since quitting: 39.4  . Smokeless tobacco: Never Used  Substance and Sexual Activity  . Alcohol use: No  . Drug use: No  . Sexual activity: None  Other Topics Concern  . None  Social History Narrative   2 caffeine drinks daily     Family History  Problem Relation Age of Onset  . Hypertension Father   . Heart attack Father        Age 15 (MI)  . Heart failure Father   . Colonic polyp Sister        and Father  . Stroke Mother   . Colon cancer Neg Hx   . Stomach cancer Neg Hx     Review of Systems  Constitutional: Negative for chills and fever.  Respiratory: Positive for shortness of breath (sometimes with yard work). Negative for cough and wheezing.   Cardiovascular: Negative for chest pain, palpitations and leg swelling.  Neurological: Negative for light-headedness and headaches.       Objective:   Vitals:   12/04/16 1115  BP: (!) 154/90  Pulse: 62  Resp: 16  Temp: 97.8 F (36.6 C)  SpO2: 98%   Wt Readings from Last 3 Encounters:  12/04/16 227 lb (103 kg)  06/01/16 231 lb (104.8 kg)  04/06/16 228 lb 12.8 oz (103.8 kg)   Body mass index is 35.55 kg/m.   Physical Exam    Constitutional: Appears well-developed and well-nourished. No distress.  HENT:  Head: Normocephalic and atraumatic.  Neck: Neck supple. No tracheal deviation present. No thyromegaly present.  No cervical lymphadenopathy Cardiovascular: Normal rate, regular rhythm and normal heart sounds.   No murmur heard. No carotid bruit .  2 + ankle edema b/l Pulmonary/Chest: Effort normal and breath sounds normal. No respiratory distress. No has no wheezes. No rales.  Skin: Skin is warm and dry. Not diaphoretic.  Psychiatric: Normal mood and affect. Behavior is normal.      Assessment & Plan:    See Problem List for Assessment and Plan of chronic medical problems.

## 2016-12-04 ENCOUNTER — Encounter: Payer: Self-pay | Admitting: Internal Medicine

## 2016-12-04 ENCOUNTER — Other Ambulatory Visit (INDEPENDENT_AMBULATORY_CARE_PROVIDER_SITE_OTHER): Payer: Medicare Other

## 2016-12-04 ENCOUNTER — Ambulatory Visit (INDEPENDENT_AMBULATORY_CARE_PROVIDER_SITE_OTHER): Payer: Medicare Other | Admitting: Internal Medicine

## 2016-12-04 VITALS — BP 154/90 | HR 62 | Temp 97.8°F | Resp 16 | Wt 227.0 lb

## 2016-12-04 DIAGNOSIS — R7303 Prediabetes: Secondary | ICD-10-CM | POA: Diagnosis not present

## 2016-12-04 DIAGNOSIS — F419 Anxiety disorder, unspecified: Secondary | ICD-10-CM

## 2016-12-04 DIAGNOSIS — I10 Essential (primary) hypertension: Secondary | ICD-10-CM

## 2016-12-04 DIAGNOSIS — I482 Chronic atrial fibrillation, unspecified: Secondary | ICD-10-CM

## 2016-12-04 DIAGNOSIS — E7800 Pure hypercholesterolemia, unspecified: Secondary | ICD-10-CM

## 2016-12-04 LAB — COMPREHENSIVE METABOLIC PANEL
ALT: 16 U/L (ref 0–53)
AST: 21 U/L (ref 0–37)
Albumin: 4.4 g/dL (ref 3.5–5.2)
Alkaline Phosphatase: 64 U/L (ref 39–117)
BUN: 16 mg/dL (ref 6–23)
CO2: 31 meq/L (ref 19–32)
Calcium: 10 mg/dL (ref 8.4–10.5)
Chloride: 103 mEq/L (ref 96–112)
Creatinine, Ser: 1.15 mg/dL (ref 0.40–1.50)
GFR: 65.49 mL/min (ref >60.00)
GLUCOSE: 109 mg/dL — AB (ref 70–99)
POTASSIUM: 4.6 meq/L (ref 3.5–5.1)
SODIUM: 140 meq/L (ref 135–145)
Total Bilirubin: 0.8 mg/dL (ref 0.2–1.2)
Total Protein: 7.2 g/dL (ref 6.0–8.3)

## 2016-12-04 LAB — LIPID PANEL
CHOL/HDL RATIO: 3
Cholesterol: 165 mg/dL (ref 0–200)
HDL: 56.1 mg/dL (ref >39.00)
LDL CALC: 94 mg/dL (ref 0–99)
NONHDL: 109.22
Triglycerides: 74 mg/dL (ref 0.0–149.0)
VLDL: 14.8 mg/dL (ref 0.0–40.0)

## 2016-12-04 LAB — CBC WITH DIFFERENTIAL/PLATELET
BASOS ABS: 0.1 10*3/uL (ref 0.0–0.1)
BASOS PCT: 0.9 % (ref 0.0–3.0)
Eosinophils Absolute: 0.2 10*3/uL (ref 0.0–0.7)
Eosinophils Relative: 1.9 % (ref 0.0–5.0)
HEMATOCRIT: 46.4 % (ref 39.0–52.0)
HEMOGLOBIN: 15.4 g/dL (ref 13.0–17.0)
LYMPHS PCT: 45.1 % (ref 12.0–46.0)
Lymphs Abs: 3.7 10*3/uL (ref 0.7–4.0)
MCHC: 33.3 g/dL (ref 30.0–36.0)
MCV: 97.7 fl (ref 78.0–100.0)
MONOS PCT: 8.9 % (ref 3.0–12.0)
Monocytes Absolute: 0.7 10*3/uL (ref 0.1–1.0)
NEUTROS ABS: 3.5 10*3/uL (ref 1.4–7.7)
Neutrophils Relative %: 43.2 % (ref 43.0–77.0)
Platelets: 216 10*3/uL (ref 150.0–400.0)
RBC: 4.74 Mil/uL (ref 4.22–5.81)
RDW: 15.1 % (ref 11.5–15.5)
WBC: 8.2 10*3/uL (ref 4.0–10.5)

## 2016-12-04 LAB — HEMOGLOBIN A1C: HEMOGLOBIN A1C: 6 % (ref 4.6–6.5)

## 2016-12-04 NOTE — Assessment & Plan Note (Signed)
Check lipid panel  Continue daily statin Regular exercise and healthy diet encouraged  

## 2016-12-04 NOTE — Assessment & Plan Note (Signed)
Asymptomatic, rate controlled Sees cardiology Continue current medication, pradaxa Check cbc

## 2016-12-04 NOTE — Assessment & Plan Note (Signed)
Controlled, stable Continue current dose of medication  

## 2016-12-04 NOTE — Assessment & Plan Note (Signed)
Check a1c Low sugar / carb diet Stressed regular exercise, weight loss  

## 2016-12-04 NOTE — Assessment & Plan Note (Addendum)
BP Readings from Last 3 Encounters:  12/04/16 (!) 154/90  06/01/16 (!) 148/88  04/06/16 138/78   Not controlled Discussed adding hydralazine - he wants to work on lifestyle first Stressed starting regular exercise and working on weight loss Low sodium diet - he is not compliant with a low sodium diet if he is eating out a lot Cmp, cbc today

## 2016-12-06 ENCOUNTER — Encounter: Payer: Self-pay | Admitting: Internal Medicine

## 2016-12-13 ENCOUNTER — Other Ambulatory Visit: Payer: Self-pay | Admitting: Internal Medicine

## 2016-12-20 ENCOUNTER — Encounter: Payer: Self-pay | Admitting: Internal Medicine

## 2016-12-23 ENCOUNTER — Other Ambulatory Visit: Payer: Self-pay

## 2016-12-23 ENCOUNTER — Emergency Department (HOSPITAL_BASED_OUTPATIENT_CLINIC_OR_DEPARTMENT_OTHER)
Admission: EM | Admit: 2016-12-23 | Discharge: 2016-12-23 | Disposition: A | Discharge status: Home / Self Care | Payer: Medicare Other | Attending: Emergency Medicine | Admitting: Emergency Medicine

## 2016-12-23 ENCOUNTER — Encounter (HOSPITAL_BASED_OUTPATIENT_CLINIC_OR_DEPARTMENT_OTHER): Payer: Self-pay | Admitting: Emergency Medicine

## 2016-12-23 ENCOUNTER — Emergency Department (HOSPITAL_BASED_OUTPATIENT_CLINIC_OR_DEPARTMENT_OTHER): Payer: Medicare Other

## 2016-12-23 DIAGNOSIS — Z79899 Other long term (current) drug therapy: Secondary | ICD-10-CM | POA: Diagnosis not present

## 2016-12-23 DIAGNOSIS — Y929 Unspecified place or not applicable: Secondary | ICD-10-CM | POA: Insufficient documentation

## 2016-12-23 DIAGNOSIS — E119 Type 2 diabetes mellitus without complications: Secondary | ICD-10-CM | POA: Insufficient documentation

## 2016-12-23 DIAGNOSIS — Z23 Encounter for immunization: Secondary | ICD-10-CM | POA: Insufficient documentation

## 2016-12-23 DIAGNOSIS — Y999 Unspecified external cause status: Secondary | ICD-10-CM | POA: Diagnosis not present

## 2016-12-23 DIAGNOSIS — W19XXXA Unspecified fall, initial encounter: Secondary | ICD-10-CM

## 2016-12-23 DIAGNOSIS — J45909 Unspecified asthma, uncomplicated: Secondary | ICD-10-CM | POA: Insufficient documentation

## 2016-12-23 DIAGNOSIS — S279XXA Injury of unspecified intrathoracic organ, initial encounter: Secondary | ICD-10-CM | POA: Diagnosis not present

## 2016-12-23 DIAGNOSIS — Y9389 Activity, other specified: Secondary | ICD-10-CM | POA: Insufficient documentation

## 2016-12-23 DIAGNOSIS — I110 Hypertensive heart disease with heart failure: Secondary | ICD-10-CM | POA: Diagnosis not present

## 2016-12-23 DIAGNOSIS — S2242XA Multiple fractures of ribs, left side, initial encounter for closed fracture: Secondary | ICD-10-CM | POA: Diagnosis not present

## 2016-12-23 DIAGNOSIS — W01198A Fall on same level from slipping, tripping and stumbling with subsequent striking against other object, initial encounter: Secondary | ICD-10-CM | POA: Insufficient documentation

## 2016-12-23 DIAGNOSIS — Z87891 Personal history of nicotine dependence: Secondary | ICD-10-CM | POA: Diagnosis not present

## 2016-12-23 DIAGNOSIS — S29001A Unspecified injury of muscle and tendon of front wall of thorax, initial encounter: Secondary | ICD-10-CM | POA: Diagnosis present

## 2016-12-23 DIAGNOSIS — I4891 Unspecified atrial fibrillation: Secondary | ICD-10-CM | POA: Insufficient documentation

## 2016-12-23 DIAGNOSIS — S2232XA Fracture of one rib, left side, initial encounter for closed fracture: Secondary | ICD-10-CM | POA: Insufficient documentation

## 2016-12-23 DIAGNOSIS — I509 Heart failure, unspecified: Secondary | ICD-10-CM | POA: Diagnosis not present

## 2016-12-23 DIAGNOSIS — R0781 Pleurodynia: Secondary | ICD-10-CM | POA: Diagnosis not present

## 2016-12-23 MED ORDER — ONDANSETRON 4 MG PO TBDP
4.0000 mg | ORAL_TABLET | Freq: Once | ORAL | Status: AC
  Administered 2016-12-23: 4 mg via ORAL
  Filled 2016-12-23: qty 1

## 2016-12-23 MED ORDER — MORPHINE SULFATE (PF) 4 MG/ML IV SOLN
4.0000 mg | Freq: Once | INTRAVENOUS | Status: AC
  Administered 2016-12-23: 4 mg via INTRAMUSCULAR
  Filled 2016-12-23: qty 1

## 2016-12-23 MED ORDER — TRAMADOL HCL 50 MG PO TABS: 50.0000 mg | ORAL_TABLET | Freq: Four times a day (QID) | ORAL | 0 refills | Status: DC | PRN

## 2016-12-23 MED ORDER — TETANUS-DIPHTH-ACELL PERTUSSIS 5-2.5-18.5 LF-MCG/0.5 IM SUSP
0.5000 mL | Freq: Once | INTRAMUSCULAR | Status: AC
  Administered 2016-12-23: 0.5 mL via INTRAMUSCULAR
  Filled 2016-12-23: qty 0.5

## 2016-12-23 MED ORDER — BACITRACIN ZINC 500 UNIT/GM EX OINT: TOPICAL_OINTMENT | Freq: Two times a day (BID) | CUTANEOUS | Status: DC

## 2016-12-23 NOTE — Discharge Instructions (Signed)
Take Tylenol 1000 mg 4 times a day for 1 week. This is the maximum dose of Tylenol (acetaminophen) you can take from all sources. Please check other over-the-counter medications and prescriptions to ensure you are not taking other medications that contain acetaminophen.  Take tramadol as needed for breakthrough pain.  This medication can be addicting, sedating and cause constipation.  Consider taking a stool softener or miralax if you are taking this medication.

## 2016-12-23 NOTE — ED Triage Notes (Signed)
Presents via EMS, fall down several steps on Thursday. C/o L side rib pain with movement and inspiration.

## 2016-12-23 NOTE — ED Notes (Signed)
ED Provider at bedside. 

## 2016-12-24 NOTE — ED Provider Notes (Signed)
[facility] EMERGENCY DEPARTMENT Provider Note   CSN: [id number] Arrival date & time: 12/23/16  1824     History   Chief Complaint Chief Complaint  Patient presents with  . Fall    HPI Samuel Moyer. is a 76 y.o. male.  HPI 76 year old Caucasian male past medical history significant for atrial fibrillation, CHF, hypertension, sleep apnea presents to the emergency department today by EMS with complaints of left rib pain after a fall 2 days ago.  The patient states that 2 days ago over Thanksgiving he missed the last 2 steps and tripped falling onto his left ribs and hitting his left knee on the ground.  Patient has been ambulatory since the event.  States that the pain is been grossly worsening in his left side.  Reports pain with movement, palpation and breathing.  Patient able take deep breath due to the pain.  Patient has not tried taking the medications for his pain prior to arrival.  Nothing makes better.  Patient also reports scraping his left knee but has been ambulatory.  Denies any pain with range of motion the left knee.  Patient states that his tetanus shot is not up-to-date.  Patient denies any associated head injury or LOC.  Denies any abdominal pain, urinary sets, lightheadedness, dizziness, headache, vision changes.  Nocturia. Past Medical History:  Diagnosis Date  . A-fib ([place])   . Anxiety   . Atrial fibrillation ([employer])   . CHF (congestive heart failure) ([place])   . Claustrophobia    Occasionally when flying   . Colitis   . Diabetes mellitus, type 2 ([place])   . Gout   . Hemorrhoids   . Hyperlipidemia   . Hypertension   . LV dysfunction    EF 40-45%  . OSA (obstructive sleep apnea)    CPAP machine   . PVC's (premature ventricular contractions)     Patient Active Problem List   Diagnosis Date Noted  . Gout attack 06/03/2015  . Prediabetes 03/05/2015  . Obesity 12/28/2014  . Venous (peripheral) insufficiency 02/16/2014  . OSA (obstructive sleep  apnea) 05/24/2010  . ATRIAL FIBRILLATION  01/26/2010  . Chronic systolic heart failure ([place]) 01/26/2010  . Asthma 01/14/2010  . Hypercholesterolemia 10/21/2007  . Anxiety 10/21/2007  . Essential hypertension 02/26/2006  . COLONIC POLYPS, HX OF 02/26/2006    Past Surgical History:  Procedure Laterality Date  . CARDIOVASCULAR STRESS TEST  03/02/2010   EF 50%  . US ECHOCARDIOGRAPHY  11/15/2009   EF 40-45%       Home Medications    Prior to Admission medications   Medication Sig Start Date End Date Taking? Authorizing Provider  amLODipine (NORVASC) 5 MG tablet Take 1.5 tablets (7.5 mg total) by mouth daily. 03/21/16   Martinique, Peter M, MD  atorvastatin (LIPITOR) 10 MG tablet TAKE 1 TABLET DAILY 10/20/16   Binnie Rail, MD  benazepril (LOTENSIN) 40 MG tablet TAKE 1 TABLET DAILY 07/24/16   Binnie Rail, MD  furosemide (LASIX) 40 MG tablet TAKE 1 TABLET DAILY 02/28/16   [place], Peter M, MD  glucose blood (ONE TOUCH ULTRA TEST) test strip Check blood sugar daily prn as directed 06/01/16   Binnie Rail, MD  metoprolol tartrate (LOPRESSOR) 25 MG tablet TAKE 2 TABLETS TWICE A DAY 03/13/16   Martinique, Peter M, MD  Multiple Vitamin (MULTIVITAMIN) tablet Take 1 tablet by mouth daily.      [provider]  [facility] DELICA LANCETS 29J MISC 1 each  by Other route daily. Check blood sugar daily as directed 03/05/15   Binnie Rail, MD  PARoxetine (PAXIL) 20 MG tablet TAKE ONE-HALF (1/2) TABLET DAILY 12/13/16   Binnie Rail, MD  PRADAXA 150 MG CAPS capsule TAKE 1 CAPSULE EVERY 12 HOURS 06/13/16   Martinique, Peter M, MD  traMADol (ULTRAM) 50 MG tablet Take 1 tablet (50 mg total) by mouth every 6 (six) hours as needed. 12/23/16   Gareth Morgan, MD    Family History Family History  Problem Relation Age of Onset  . Hypertension Father   . Heart attack Father        Age 86 (MI)  . Heart failure Father   . Colonic polyp Sister        and Father  . Stroke Mother   . Colon cancer Neg Hx   .  Stomach cancer Neg Hx     Social History Social History   Tobacco Use  . Smoking status: Former Smoker    Packs/day: 1.00    Years: 16.00    Pack years: 16.00    Types: Cigarettes    Last attempt to quit: 06/30/1977    Years since quitting: 39.5  . Smokeless tobacco: Never Used  Substance Use Topics  . Alcohol use: No  . Drug use: No     Allergies   Patient has no known allergies.   Review of Systems Review of Systems  Constitutional: Negative for chills and fever.  HENT: Negative for congestion and sore throat.   Eyes: Negative for visual disturbance.  Respiratory: Negative for cough and shortness of breath.   Cardiovascular: Positive for chest pain (left rib cage). Negative for palpitations and leg swelling.  Gastrointestinal: Negative for abdominal pain, diarrhea, nausea and vomiting.  Genitourinary: Negative for dysuria, flank pain, frequency, hematuria, scrotal swelling, testicular pain and urgency.  Musculoskeletal: Negative for arthralgias and myalgias.  Skin: Positive for wound. Negative for rash.  Neurological: Negative for dizziness, syncope, weakness, light-headedness, numbness and headaches.  Psychiatric/Behavioral: Negative for sleep disturbance. The patient is not nervous/anxious.      Physical Exam Updated Vital Signs BP (!) 171/97   Pulse 88   Temp 98.8 F (37.1 C) (Oral)   Resp 17   Wt 103 kg (227 lb)   SpO2 95%   BMI 35.55 kg/m   Physical Exam  Constitutional: He is oriented to person, place, and time. He appears well-developed and well-nourished.  Non-toxic appearance. No distress.  HENT:  Head: Normocephalic and atraumatic.  Mouth/Throat: Oropharynx is clear and moist.  Eyes: Conjunctivae are normal. Pupils are equal, round, and reactive to light. Right eye exhibits no discharge. Left eye exhibits no discharge.  Neck: Normal range of motion. Neck supple.  Cardiovascular: Normal rate, regular rhythm, normal heart sounds and intact distal  pulses. Exam reveals no gallop and no friction rub.  No murmur heard. Pulmonary/Chest: Effort normal. No accessory muscle usage or stridor. No tachypnea. No respiratory distress. He has decreased breath sounds. He has no wheezes. He has no rhonchi. He has no rales. He exhibits bony tenderness. He exhibits no tenderness.  Patient taking shallow quick breaths due to the pain.  He has left-sided rib cage pain to palpation.  No obvious deformity, crepitus, ecchymosis, erythema noted.    Abdominal: Soft. Bowel sounds are normal. He exhibits no distension. There is no tenderness. There is no rigidity, no rebound, no guarding, no CVA tenderness, no tenderness at McBurney's point and negative Murphy's sign.  No  ecchymosis or pain with palpation over the abdomen.  Musculoskeletal: Normal range of motion. He exhibits no tenderness.  Patient has a skin abrasion to the left knee without any signs of overlying cellulitis or bleeding.  Patient has full range of motion of the left knee without pain.  No joint laxity noted.  DP pulses 2+.  Brisk cap refill.  Sensation intact.  No pain with range of motion of the left hip, left knee, left ankle.  Lymphadenopathy:    He has no cervical adenopathy.  Neurological: He is alert and oriented to person, place, and time.  Skin: Skin is warm and dry. Capillary refill takes less than 2 seconds. No rash noted.  Psychiatric: His behavior is normal. Judgment and thought content normal.  Nursing note and vitals reviewed.    ED Treatments / Results  Labs (all labs ordered are listed, but only abnormal results are displayed) Labs Reviewed - No data to display  EKG  EKG Interpretation None       Radiology Dg Ribs Unilateral W/chest Left  Result Date: 12/23/2016 CLINICAL DATA:  Fall 2 days ago. Left-sided pleuritic chest pain. Initial encounter. EXAM: LEFT RIBS AND CHEST - 3+ VIEW COMPARISON:  03/25/2016 FINDINGS: Mildly displaced fracture of the left anterior  seventh rib is seen. Mild atelectasis seen in the lateral left lung base. No evidence of pneumothorax or hemothorax. Stable mild cardiomegaly and ectasia of thoracic aorta. IMPRESSION: Mildly displaced fracture of left anterior seventh rib. Mild left basilar atelectasis. No evidence of pneumothorax or hemothorax. Electronically Signed   By: Earle Gell M.D.   On: 12/23/2016 19:22    Procedures Procedures (including critical care time)  Medications Ordered in ED Medications  morphine 4 MG/ML injection 4 mg (4 mg Intramuscular Given 12/23/16 2251)  ondansetron (ZOFRAN-ODT) disintegrating tablet 4 mg (4 mg Oral Given 12/23/16 2251)  Tdap (BOOSTRIX) injection 0.5 mL (0.5 mLs Intramuscular Given 12/23/16 2251)     Initial Impression / Assessment and Plan / ED Course  I have reviewed the triage vital signs and the nursing notes.  Pertinent labs & imaging results that were available during my care of the patient were reviewed by me and considered in my medical decision making (see chart for details).     Patient presents to the ED with complaints of left rib pain after mechanical fall 2 days ago.  Patient reports the pain is worse with inspiration and palpation.  He has not training for the pain prior to arrival.  Patient denies any head injury or LOC.  Does reports scraping his left knee but denies any pain of the left knee.  Patient's tetanus shot was updated in the ED.  Wound was cleaned and dressed.  Patient does not want imaging at this time and I have low suspicion for any bony pathology of the left knee.  Patient is neurovascularly intact in all extremities.  Patient is overall well-appearing and nontoxic.  Vital signs are reassuring.  Patient is not hypoxic, no tachypnea noted.  Lungs are clear to auscultation bilaterally.  X-ray reveals anterior seventh rib fracture that is mildly displaced.  Patient has no abdominal pain to palpation and doubt any intraabdominal pathology.  Patient will be  given incentive spirometer and pain medicine.  He is able to ambulate with normal gait and oxygenation is in the ED.  Have given patient very strict return precautions and follow-up with PCP within the next week.  Pt is hemodynamically stable, in NAD, & able to  ambulate in the ED. Evaluation does not show pathology that would require ongoing emergent intervention or inpatient treatment. I explained the diagnosis to the patient. Pain has been managed & has no complaints prior to dc. Pt is comfortable with above plan and is stable for discharge at this time. All questions were answered prior to disposition. Strict return precautions for f/u to the ED were discussed. Encouraged follow up with PCP.  Patient was also seen and evaluated by attending Dr. Billy Fischer who is agreed with the above plan.  Final Clinical Impressions(s) / ED Diagnoses   Final diagnoses:  Fall, initial encounter  Closed fracture of one rib of left side, initial encounter    ED Discharge Orders        Ordered    traMADol (ULTRAM) 50 MG tablet  Every 6 hours PRN     12/23/16 2324       Doristine Devoid, PA-C 12/24/16 1510    Gareth Morgan, MD 12/25/16 1453

## 2016-12-27 ENCOUNTER — Ambulatory Visit (INDEPENDENT_AMBULATORY_CARE_PROVIDER_SITE_OTHER): Payer: Medicare Other | Admitting: Internal Medicine

## 2016-12-27 ENCOUNTER — Encounter: Payer: Self-pay | Admitting: Internal Medicine

## 2016-12-27 VITALS — BP 122/80 | HR 75 | Temp 97.6°F | Resp 16 | Wt 229.0 lb

## 2016-12-27 DIAGNOSIS — S2232XA Fracture of one rib, left side, initial encounter for closed fracture: Secondary | ICD-10-CM

## 2016-12-27 DIAGNOSIS — S80212A Abrasion, left knee, initial encounter: Secondary | ICD-10-CM

## 2016-12-27 DIAGNOSIS — W19XXXA Unspecified fall, initial encounter: Secondary | ICD-10-CM

## 2016-12-27 HISTORY — DX: Fracture of one rib, left side, initial encounter for closed fracture: S22.32XA

## 2016-12-27 MED ORDER — TRAMADOL HCL 50 MG PO TABS: 50.0000 mg | ORAL_TABLET | Freq: Four times a day (QID) | ORAL | 0 refills | Status: DC | PRN

## 2016-12-27 NOTE — Patient Instructions (Addendum)
Continue to use the tramadol for pain.  This was sent to your pharmacy.  Take tylenol in addition to the tramadol.    Continue to use the incentive spirometer.   If you develop if you fever, cough or shortness of breath please let me know.    Call if no improvement

## 2016-12-27 NOTE — Progress Notes (Signed)
Subjective:    Patient ID: Samuel Moyer., male    DOB: [birth year], 76 y.o.   MRN: [id number]  HPI The patient is here for follow up from the ED.  He went to the emergency room 12/23/16 for left rib pain.  He had fallen 2 days prior at home.  He missed the last 2 steps and tripped and fell onto his left side hitting his ribs and left knee.  He was able to walk around after that.  The pain was getting worse on the left side, which is why he went to the emergency room.  He had pain with movement, palpation and breathing.  He did scrape his left knee, but denied pain with range of motion of the knee.  He denied head injury or loss of consciousness.  He is not experiencing any lightheadedness/dizziness, headaches, vision changes.  On exam he had decreased breath sounds and tenderness in the left ribs.  He was breathing shallow secondary to the pain in the left ribs.  He has had a skin abrasion on the left knee without signs of infection.  He had full range of motion of the knee.  He received a tetanus booster, Zofran and morphine in the emergency room.  His left knee wound was cleaned and dressed.  He did not want imaging of the knee and there is low suspicion for fracture.  X-ray of the ribs and chest reveal anterior seventh rib fracture that is mildly displaced.  He was given an incentive spirometer and discharged home with tramadol.  He is using the incentive spirometer.  He is taking tylenol and tramadol and it is controlling his pain.  His pain has improved over the past couple of days.  He denies any shortness of breath, coughing, wheezing or fevers.  Left knee abrasion: He still has a Band-Aid on the knee.  He denies any swelling or pain inside the knee.  He is able to walk without difficulty and has full range of motion.  Medications and allergies reviewed with patient and updated if appropriate.  Patient Active Problem List   Diagnosis Date Noted  . Fracture of one rib, left side, initial  encounter for closed fracture 12/27/2016  . Abrasion of left knee 12/27/2016  . Fall 12/27/2016  . Gout attack 06/03/2015  . Prediabetes 03/05/2015  . Obesity 12/28/2014  . Venous (peripheral) insufficiency 02/16/2014  . OSA (obstructive sleep apnea) 05/24/2010  . ATRIAL FIBRILLATION  01/26/2010  . Chronic systolic heart failure ([place]) 01/26/2010  . Asthma 01/14/2010  . Hypercholesterolemia 10/21/2007  . Anxiety 10/21/2007  . Essential hypertension 02/26/2006  . COLONIC POLYPS, HX OF 02/26/2006    Current Outpatient Medications on File Prior to Visit  Medication Sig Dispense Refill  . amLODipine (NORVASC) 5 MG tablet Take 1.5 tablets (7.5 mg total) by mouth daily. 120 tablet 3  . atorvastatin (LIPITOR) 10 MG tablet TAKE 1 TABLET DAILY 90 tablet 2  . benazepril (LOTENSIN) 40 MG tablet TAKE 1 TABLET DAILY 90 tablet 3  . furosemide (LASIX) 40 MG tablet TAKE 1 TABLET DAILY 90 tablet 3  . glucose blood (ONE TOUCH ULTRA TEST) test strip Check blood sugar daily prn as directed 100 each 0  . metoprolol tartrate (LOPRESSOR) 25 MG tablet TAKE 2 TABLETS TWICE A DAY 360 tablet 3  . Multiple Vitamin (MULTIVITAMIN) tablet Take 1 tablet by mouth daily.      [name] DELICA LANCETS 17G MISC 1 each by Other route daily.  Check blood sugar daily as directed 100 each 0  . PARoxetine (PAXIL) 20 MG tablet TAKE ONE-HALF (1/2) TABLET DAILY 45 tablet 1  . PRADAXA 150 MG CAPS capsule TAKE 1 CAPSULE EVERY 12 HOURS 180 capsule 1  . traMADol (ULTRAM) 50 MG tablet Take 1 tablet (50 mg total) by mouth every 6 (six) hours as needed. 15 tablet 0   No current facility-administered medications on file prior to visit.     Past Medical History:  Diagnosis Date  . A-fib ([place])   . Anxiety   . Atrial fibrillation ([place])   . CHF (congestive heart failure) ([place])   . Claustrophobia    Occasionally when flying   . Colitis   . Diabetes mellitus, type 2 ([place])   . Gout   . Hemorrhoids   . Hyperlipidemia   .  Hypertension   . LV dysfunction    EF 40-45%  . OSA (obstructive sleep apnea)    CPAP machine   . PVC's (premature ventricular contractions)     Past Surgical History:  Procedure Laterality Date  . CARDIOVASCULAR STRESS TEST  03/02/2010   EF 50%  . US ECHOCARDIOGRAPHY  11/15/2009   EF 40-45%    Social History   Socioeconomic History  . Marital status: Married    Spouse name: None  . Number of children: 3  . Years of education: None  . Highest education level: None  Social Needs  . Financial resource strain: None  . Food insecurity - worry: None  . Food insecurity - inability: None  . Transportation needs - medical: None  . Transportation needs - non-medical: None  Occupational History  . Occupation: Retired    Fish farm manager: RETIRED    Comment: [employer]   Tobacco Use  . Smoking status: Former Smoker    Packs/day: 1.00    Years: 16.00    Pack years: 16.00    Types: Cigarettes    Last attempt to quit: 06/30/1977    Years since quitting: 39.5  . Smokeless tobacco: Never Used  Substance and Sexual Activity  . Alcohol use: No  . Drug use: No  . Sexual activity: None  Other Topics Concern  . None  Social History Narrative   2 caffeine drinks daily     Family History  Problem Relation Age of Onset  . Hypertension Father   . Heart attack Father        Age 4 (MI)  . Heart failure Father   . Colonic polyp Sister        and Father  . Stroke Mother   . Colon cancer Neg Hx   . Stomach cancer Neg Hx     Review of Systems  Constitutional: Negative for chills and fever.  Respiratory: Negative for cough, shortness of breath and wheezing.   Cardiovascular: Positive for chest pain (left rib). Negative for palpitations and leg swelling.  Neurological: Negative for light-headedness and headaches.       Objective:   Vitals:   12/27/16 1310  BP: 122/80  Pulse: 75  Resp: 16  Temp: 97.6 F (36.4 C)  SpO2: 96%   Wt Readings from Last 3 Encounters:  12/27/16  229 lb (103.9 kg)  12/23/16 227 lb (103 kg)  12/04/16 227 lb (103 kg)   Body mass index is 35.87 kg/m.   Physical Exam    Constitutional: Appears well-developed and well-nourished. No distress.  HENT:  Head: Normocephalic and atraumatic.  Neck: Neck supple. No tracheal deviation present. No  thyromegaly present.  No cervical lymphadenopathy Cardiovascular: Normal rate, regular rhythm and normal heart sounds.   No murmur heard. No carotid bruit .  No edema Pulmonary/Chest: Effort normal and breath sounds normal. No respiratory distress. No has no wheezes. No rales. Left chest anteriorly tender. Msk: left knee without swelling or erythema.  Bandage in place and he states the abrasion is healing. Skin: Skin is warm and dry. Not diaphoretic.  Psychiatric: Normal mood and affect. Behavior is normal.      Assessment & Plan:    See Problem List for Assessment and Plan of chronic medical problems.

## 2016-12-27 NOTE — Assessment & Plan Note (Addendum)
Status post fall at home [facility] to emergency room for increasing pain in left side, worse with movement, breathing and palpation X-ray revealed left anterior seventh rib fracture mildly displaced Using incentive spirometer Taking tramadol for pain, along with Tylenol-continue Call if pain does not continue to improve Discussed symptoms of pneumonia-he will call if he experiences any

## 2016-12-27 NOTE — Assessment & Plan Note (Signed)
Fell at home 12/21/16-missed the last 2 steps Injured left knee and sustained left seventh anterior rib fracture, mildly displaced

## 2016-12-27 NOTE — Assessment & Plan Note (Addendum)
Secondary to fall 12/21/16 Evaluated in ED without evidence of infection Tetanus booster given Continue local wound care

## 2017-01-31 DIAGNOSIS — R739 Hyperglycemia, unspecified: Secondary | ICD-10-CM | POA: Diagnosis not present

## 2017-01-31 DIAGNOSIS — H353131 Nonexudative age-related macular degeneration, bilateral, early dry stage: Secondary | ICD-10-CM | POA: Diagnosis not present

## 2017-01-31 DIAGNOSIS — H2513 Age-related nuclear cataract, bilateral: Secondary | ICD-10-CM | POA: Diagnosis not present

## 2017-01-31 DIAGNOSIS — H401131 Primary open-angle glaucoma, bilateral, mild stage: Secondary | ICD-10-CM | POA: Diagnosis not present

## 2017-01-31 DIAGNOSIS — H25013 Cortical age-related cataract, bilateral: Secondary | ICD-10-CM | POA: Diagnosis not present

## 2017-01-31 DIAGNOSIS — H11153 Pinguecula, bilateral: Secondary | ICD-10-CM | POA: Diagnosis not present

## 2017-02-04 ENCOUNTER — Other Ambulatory Visit: Payer: Self-pay | Admitting: Cardiology

## 2017-02-22 ENCOUNTER — Other Ambulatory Visit: Payer: Self-pay | Admitting: Cardiology

## 2017-03-08 ENCOUNTER — Other Ambulatory Visit: Payer: Self-pay | Admitting: Cardiology

## 2017-03-08 NOTE — Telephone Encounter (Signed)
REFILL 

## 2017-04-05 ENCOUNTER — Encounter: Payer: Self-pay | Admitting: Pulmonary Disease

## 2017-04-05 ENCOUNTER — Ambulatory Visit (INDEPENDENT_AMBULATORY_CARE_PROVIDER_SITE_OTHER): Payer: Medicare Other | Admitting: Pulmonary Disease

## 2017-04-05 DIAGNOSIS — G4733 Obstructive sleep apnea (adult) (pediatric): Secondary | ICD-10-CM | POA: Diagnosis not present

## 2017-04-05 NOTE — Patient Instructions (Signed)
We will try to get you a new machine. Auto CPAP 10-18 cm, check download in 1 month

## 2017-04-05 NOTE — Assessment & Plan Note (Signed)
Worsening could be related to weight gain of 34 pounds since his sleep study. Weight loss encouraged

## 2017-04-05 NOTE — Assessment & Plan Note (Signed)
He has high residuals and 14 cm, in the past higher pressure has been associated with central apneas, and we will trial Auto CPAP 10-18 cm, check download in 1 month We will recheck download to's ensure that central apneas are not emerging  Weight loss encouraged, compliance with goal of at least 4-6 hrs every night is the expectation. Advised against medications with sedative side effects Cautioned against driving when sleepy - understanding that sleepiness will vary on a day to day basis

## 2017-04-05 NOTE — Addendum Note (Signed)
Addended by: Valerie Salts on: 04/05/2017 11:00 AM   Modules accepted: Orders

## 2017-04-05 NOTE — Progress Notes (Signed)
   Subjective:    Patient ID: Samuel Moyer., male    DOB: September 21, 1940, 77 y.o.   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 Again he is doing well on his machine and denies excessive daytime somnolence or fatigue.  He continues to work at [employer] at [employer]. In the past, all his downloads have shown residual AHI of around 10-15/hour.  He has gained about 35 pounds since his sleep study in 2012.  He has had the same machine since 2012. Download shows excellent compliance about 8 hours every night on 14 cm  He has  not seen his cardiologist in a year, occasionally has runs of atrial fibrillation  Significant tests/ events   EF 40-45%, failed cardioversion in dec'11.  PSG 04/06/10 (wt 200) showed severe obstructive sleep apnea with AHI 66/h, nadir desatn 75% corrrected by CPAP 10 cm to AHI 4.5/h. Higher pressures were associated with emergence of central events.     Review of Systems Patient denies significant dyspnea,cough, hemoptysis,  chest pain, palpitations, pedal edema, orthopnea, paroxysmal nocturnal dyspnea, lightheadedness, nausea, vomiting, abdominal or  leg pains      Objective:   Physical Exam   Gen. Pleasant, elderly, obese, in no distress ENT - no lesions, no post nasal drip Neck: No JVD, no thyromegaly, no carotid bruits Lungs: no use of accessory muscles, no dullness to percussion, decreased without rales or rhonchi  Cardiovascular: Rhythm regular, heart sounds  normal, no murmurs or gallops, no peripheral edema Musculoskeletal: No deformities, no cyanosis or clubbing , no tremors        Assessment & Plan:

## 2017-05-01 DIAGNOSIS — H903 Sensorineural hearing loss, bilateral: Secondary | ICD-10-CM | POA: Diagnosis not present

## 2017-05-03 DIAGNOSIS — H401131 Primary open-angle glaucoma, bilateral, mild stage: Secondary | ICD-10-CM | POA: Diagnosis not present

## 2017-05-07 NOTE — Progress Notes (Signed)
Samuel Moyer. Date of Birth: 1940-02-22   History of Present Illness: Mr. Peets is seen today for followup of CHF and atrial fibrillation. He has a history of permanent atrial fibrillation. He also is a history of congestive heart failure with ejection fraction of 40-45%. Normal myoview in 2011 with EF of 50% at that time. He does have OSA on CPAP and is followed by pulmonary.  On followup today he reports he is doing well. He did have a mechanical fall in November and fractured a rib.  He currently denies any SOB, chest pain, or palpitations. No dizziness or fatigue.He is unaware of his Afib. He has chronic edema L>R.  He is still on lasix 40 mg daily. 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  Current Outpatient Medications on File Prior to Visit  Medication Sig Dispense Refill  . amLODipine (NORVASC) 5 MG tablet TAKE ONE AND ONE-HALF TABLETS DAILY 120 tablet 3  . atorvastatin (LIPITOR) 10 MG tablet TAKE 1 TABLET DAILY 90 tablet 2  . benazepril (LOTENSIN) 40 MG tablet TAKE 1 TABLET DAILY 90 tablet 3  . furosemide (LASIX) 40 MG tablet TAKE 1 TABLET DAILY 90 tablet 3  . glucose blood (ONE TOUCH ULTRA TEST) test strip Check blood sugar daily prn as directed 100 each 0  . LUMIGAN 0.01 % SOLN     . metoprolol tartrate (LOPRESSOR) 25 MG tablet Take 2 tablets (50 mg total) by mouth 2 (two) times daily. NEED OV. 360 tablet 0  . Multiple Vitamin (MULTIVITAMIN) tablet Take 1 tablet by mouth daily.      [name] DELICA LANCETS 76B MISC 1 each by Other route daily. Check blood sugar daily as directed 100 each 0  . PARoxetine (PAXIL) 20 MG tablet TAKE ONE-HALF (1/2) TABLET DAILY 45 tablet 1   No current facility-administered medications on file prior to visit.     No Known Allergies  Past Medical History:  Diagnosis Date  . A-fib ([place])   . Anxiety   . Atrial fibrillation ([place])   . CHF (congestive heart failure) ([place])   . Claustrophobia    Occasionally  when flying   . Colitis   . Diabetes mellitus, type 2 ([place])   . Gout   . Hemorrhoids   . Hyperlipidemia   . Hypertension   . LV dysfunction    EF 40-45%  . OSA (obstructive sleep apnea)    CPAP machine   . PVC's (premature ventricular contractions)     Past Surgical History:  Procedure Laterality Date  . CARDIOVASCULAR STRESS TEST  03/02/2010   EF 50%  . US ECHOCARDIOGRAPHY  11/15/2009   EF 40-45%    Social History   Tobacco Use  Smoking Status Former Smoker  . Packs/day: 1.00  . Years: 16.00  . Pack years: 16.00  . Types: Cigarettes  . Last attempt to quit: 06/30/1977  . Years since quitting: 39.8  Smokeless Tobacco Never Used    Social History   Substance and Sexual Activity  Alcohol Use No    Family History  Problem Relation Age of Onset  . Hypertension Father   . Heart attack Father        Age 25 (MI)  . Heart failure Father   . Colonic polyp Sister        and Father  . Stroke Mother   . Colon cancer Neg Hx   . Stomach cancer Neg Hx  Review of Systems: As noted in history of present illness  All other systems were reviewed and are negative.  Physical Exam: BP 130/84 (BP Location: Right Arm, Cuff Size: Normal)   Pulse 63   Ht 5\' 7"  (1.702 m)   Wt 236 lb 6.4 oz (107.2 kg)   BMI 37.03 kg/m  GENERAL:  Well appearing obese WM in NAD HEENT:  PERRL, EOMI, sclera are clear. Oropharynx is clear. NECK:  No jugular venous distention, carotid upstroke brisk and symmetric, no bruits, no thyromegaly or adenopathy LUNGS:  Clear to auscultation bilaterally CHEST:  Unremarkable HEART:  IRRR,  PMI not displaced or sustained,S1 and S2 within normal limits, no S3, no S4: no clicks, no rubs, no murmurs ABD:  Soft, nontender. BS +, no masses or bruits. No hepatomegaly, no splenomegaly EXT:  2 + pulses throughout, 2+ left and 1+ right lower extremity edema, no cyanosis no clubbing SKIN:  Warm and dry.  No rashes NEURO:  Alert and oriented x 3. Cranial nerves II  through XII intact. PSYCH:  Cognitively intact    LABORATORY DATA: Lab Results  Component Value Date   WBC 8.2 12/04/2016   HGB 15.4 12/04/2016   HCT 46.4 12/04/2016   PLT 216.0 12/04/2016   GLUCOSE 109 (H) 12/04/2016   CHOL 165 12/04/2016   TRIG 74.0 12/04/2016   HDL 56.10 12/04/2016   LDLCALC 94 12/04/2016   ALT 16 12/04/2016   AST 21 12/04/2016   NA 140 12/04/2016   K 4.6 12/04/2016   CL 103 12/04/2016   CREATININE 1.15 12/04/2016   BUN 16 12/04/2016   CO2 31 12/04/2016   TSH 1.30 06/01/2016   PSA 0.63 09/12/2007   HGBA1C 6.0 12/04/2016   MICROALBUR 1.8 06/13/2013   Ecg today shows Afib rate 62. RBBB. I have personally reviewed and interpreted this study.   Assessment / Plan: 1. Permanent atrial fibrillation. Rate is well controlled.  Continue metoprolol.  He is on chronic anticoagulation with Pradaxa. Renal function and Hgb have been normal.  2. Congestive heart failure with chronic systolic dysfunction. Ejection fraction is mildly reduced. Continue ACE inhibitor, beta blocker, and diuretic therapy. Encourage sodium restriction. Wear support hose.    3. Hypertension, BP is  controlled.   I will follow up in one year.

## 2017-05-08 ENCOUNTER — Encounter: Payer: Self-pay | Admitting: Cardiology

## 2017-05-08 ENCOUNTER — Ambulatory Visit (INDEPENDENT_AMBULATORY_CARE_PROVIDER_SITE_OTHER): Payer: Medicare Other | Admitting: Cardiology

## 2017-05-08 VITALS — BP 130/84 | HR 63 | Ht 67.0 in | Wt 236.4 lb

## 2017-05-08 DIAGNOSIS — I10 Essential (primary) hypertension: Secondary | ICD-10-CM | POA: Diagnosis not present

## 2017-05-08 DIAGNOSIS — E7800 Pure hypercholesterolemia, unspecified: Secondary | ICD-10-CM | POA: Diagnosis not present

## 2017-05-08 DIAGNOSIS — I5022 Chronic systolic (congestive) heart failure: Secondary | ICD-10-CM | POA: Diagnosis not present

## 2017-05-08 DIAGNOSIS — I482 Chronic atrial fibrillation, unspecified: Secondary | ICD-10-CM

## 2017-05-08 MED ORDER — DABIGATRAN ETEXILATE MESYLATE 150 MG PO CAPS: ORAL_CAPSULE | ORAL | 3 refills | Status: DC

## 2017-05-08 NOTE — Patient Instructions (Addendum)
Continue your current therapy  Focus on losing weight.  I will see you in one year

## 2017-06-06 ENCOUNTER — Other Ambulatory Visit: Payer: Self-pay | Admitting: Cardiology

## 2017-06-07 ENCOUNTER — Ambulatory Visit: Payer: Medicare Other | Admitting: Internal Medicine

## 2017-06-08 ENCOUNTER — Ambulatory Visit: Payer: Medicare Other

## 2017-06-11 ENCOUNTER — Other Ambulatory Visit: Payer: Self-pay | Admitting: Internal Medicine

## 2017-07-17 ENCOUNTER — Other Ambulatory Visit: Payer: Self-pay | Admitting: Internal Medicine

## 2017-09-03 ENCOUNTER — Encounter: Payer: Self-pay | Admitting: Pulmonary Disease

## 2017-09-03 ENCOUNTER — Ambulatory Visit (INDEPENDENT_AMBULATORY_CARE_PROVIDER_SITE_OTHER): Payer: Medicare Other | Admitting: Pulmonary Disease

## 2017-09-03 VITALS — BP 114/72 | HR 47 | Ht 67.0 in | Wt 234.0 lb

## 2017-09-03 DIAGNOSIS — H401131 Primary open-angle glaucoma, bilateral, mild stage: Secondary | ICD-10-CM | POA: Diagnosis not present

## 2017-09-03 DIAGNOSIS — G4733 Obstructive sleep apnea (adult) (pediatric): Secondary | ICD-10-CM

## 2017-09-03 NOTE — Assessment & Plan Note (Signed)
Samuel Moyer presents with obstructive sleep apnea w/ some improvement on CPAP but still having too many apneic events on Auto CPAP 10-18cm (AHI 32.2/hr) with many leak events - Currently on nose/mouth mask, may need better size - Data shows significant amount of central apneic event - Schedule for titration sleep study

## 2017-09-03 NOTE — Progress Notes (Addendum)
Samuel Moyer    [id number]    Dec 23, 1940  Primary Care Physician:Moyer, Samuel Lick, MD  Referring Physician: Binnie Rail, MD [name], [place] [id number]  Chief complaint:  Obstructive Sleep Apnea  HPI: Samuel Moyer is a 77 yo M w/ T2DM, HLD, HTN, CHF, and GERD presenting to the clinic for management of his obstructive sleep apnea. He was started on CPAP auto 10-18 starting last week. He has been using his CPAP as instructed with the new machine. He states he has not had any complications with his new machines but he does hear 'air go by' when he wakes up through the sides of his mask. His wife states that his snoring has improved. He states he feels rested and not as tired as before using CPAP but he doesn't feel a significant difference with his old CPAP machine. He also states he knows that his diet has been bad with lot of french fries and potato chips and he knows that weight loss will improve his obstructive sleep apnea. He denies any night time awakening, cough, daytime somnolence. Denies any chest pain, palpitations, shortness of breath.  Data Reviewed:  CPAP data downloaded: Compliance with everyday use at min 10 max 18 pressure; Significant leaks >50% of days used; AHI 32.2 events/hr  Allergies as of 09/03/2017  . (No Known Allergies)    Past Medical History:  Diagnosis Date  . A-fib ([place])   . Anxiety   . Atrial fibrillation ([place])   . CHF (congestive heart failure) ([place])   . Claustrophobia    Occasionally when flying   . Colitis   . Diabetes mellitus, type 2 ([place])   . Gout   . Hemorrhoids   . Hyperlipidemia   . Hypertension   . LV dysfunction    EF 40-45%  . OSA (obstructive sleep apnea)    CPAP machine   . PVC's (premature ventricular contractions)     Past Surgical History:  Procedure Laterality Date  . CARDIOVASCULAR STRESS TEST  03/02/2010   EF 50%  . US ECHOCARDIOGRAPHY  11/15/2009   EF 40-45%     Review of systems: Review of  Systems  Constitutional: Negative for fever and chills.  HENT: Negative.   Eyes: Negative for blurred vision.  Respiratory: as per HPI  Cardiovascular: Negative for chest pain and palpitations.  Gastrointestinal: Negative for vomiting, diarrhea, blood per rectum. Genitourinary: Negative for dysuria, urgency, frequency and hematuria.  Musculoskeletal: Negative for myalgias, back pain and joint pain.  Skin: Negative for itching and rash.  Neurological: Negative for dizziness, tremors, focal weakness, seizures and loss of consciousness.  Endo/Heme/Allergies: Negative for environmental allergies.  Psychiatric/Behavioral: Negative for depression, suicidal ideas and hallucinations.  All other systems reviewed and are negative.  Physical Exam: Blood pressure 114/72, pulse (!) 47, height 5\' 7"  (1.702 m), weight 234 lb (106.1 kg), SpO2 96 %. Gen:      Obese Caucasian male in no acute distress HEENT:  EOMI, sclera anicteric Neck:     No masses; no thyromegaly Lungs:    Clear to auscultation bilaterally; normal respiratory effort; no wheezes, rales or rhonchi  CV:         Regular rate and rhythm; no murmurs Ext:    No edema; adequate peripheral perfusion Skin:      Warm and dry; no rash Neuro: alert and oriented x 3 Psych: normal mood and affect  A/P: Refer to Encounters tab for Problem  based charting  Samuel Moyer, PGY1 [facility] 09/03/2017, 4:16 PM  CC: Samuel Rail, MD  Independently examined pt, evaluated data & formulated above care plan with NP/resident   77 year old man with severe OSA.  He continues to have persistent events on his CPAP download.  I changed him to auto CPAP settings 10 to 18 cm on his last visit and on review of his download today he has residual AHI of 32/hour with with average pressure of 17 cm.  He continues to feel okay and denies excessive daytime somnolence or witnessed apneas and snoring is been controlled. I think this is consistent  with treatment emergent central apneas.  We will schedule a titrated attended study, will start with CPAP of 10 to 12 cm and see if events can be controlled, he very well may need bilevel if he has significant central or even ASV  Samuel V. Elsworth Soho MD

## 2017-09-03 NOTE — Patient Instructions (Signed)
We will set you up for a titration study and adjust machine settings as required

## 2017-09-04 ENCOUNTER — Other Ambulatory Visit: Payer: Self-pay | Admitting: Cardiology

## 2017-09-09 ENCOUNTER — Other Ambulatory Visit: Payer: Self-pay | Admitting: Internal Medicine

## 2017-09-20 NOTE — Progress Notes (Signed)
Subjective:    Patient ID: Samuel Guymon., male    DOB: 1940/12/04, 77 y.o.   MRN: [id number]  HPI The patient is here for follow up.  Afib, Hypertension: He is taking his medication daily. He is compliant with a low sodium diet.  He denies chest pain, palpitations and regular headaches. He has chronic leg edema that is stable.  He is not exercising regularly.  He does not monitor his blood pressure at home.    Prediabetes:  He is fairly compliant with a low sugar/carbohydrate diet.  He is not exercising regularly.  Hyperlipidemia: He is taking his medication daily. He is somewhat compliant with a low fat/cholesterol diet. He is not exercising regularly. He denies myalgias.   Anxiety: He is taking his medication daily as prescribed. He denies any side effects from the medication. He feels his anxiety is well controlled and he is happy with his current dose of medication.   He is interested in being tested for hepatitis C.  Medications and allergies reviewed with patient and updated if appropriate.  Patient Active Problem List   Diagnosis Date Noted  . Fall 12/27/2016  . Gout attack 06/03/2015  . Prediabetes 03/05/2015  . Obesity 12/28/2014  . Venous (peripheral) insufficiency 02/16/2014  . OSA (obstructive sleep apnea) 05/24/2010  . ATRIAL FIBRILLATION  01/26/2010  . Chronic systolic heart failure ([place]) 01/26/2010  . Asthma 01/14/2010  . Hypercholesterolemia 10/21/2007  . Anxiety 10/21/2007  . Essential hypertension 02/26/2006  . COLONIC POLYPS, HX OF 02/26/2006    Current Outpatient Medications on File Prior to Visit  Medication Sig Dispense Refill  . amLODipine (NORVASC) 5 MG tablet TAKE ONE AND ONE-HALF TABLETS DAILY 120 tablet 3  . dabigatran (PRADAXA) 150 MG CAPS capsule TAKE 1 CAPSULE EVERY 12 HOURS 180 capsule 3  . furosemide (LASIX) 40 MG tablet TAKE 1 TABLET DAILY 90 tablet 3  . glucose blood (ONE TOUCH ULTRA TEST) test strip Check blood sugar daily prn as  directed 100 each 0  . LUMIGAN 0.01 % SOLN     . metoprolol tartrate (LOPRESSOR) 25 MG tablet TAKE 2 TABLETS TWICE A DAY (NEED OFFICE VISIT) (SCHEDULE AN APPOINTMENT FOR FUTURE REFILLS) 360 tablet 0  . Multiple Vitamin (MULTIVITAMIN) tablet Take 1 tablet by mouth daily.      [name] DELICA LANCETS 45G MISC 1 each by Other route daily. Check blood sugar daily as directed 100 each 0   No current facility-administered medications on file prior to visit.     Past Medical History:  Diagnosis Date  . A-fib ([place])   . Anxiety   . Atrial fibrillation ([place])   . CHF (congestive heart failure) ([place])   . Claustrophobia    Occasionally when flying   . Colitis   . Diabetes mellitus, type 2 ([place])   . Fracture of one rib, left side, initial encounter for closed fracture 12/27/2016   Occurred 12/21/16 after a fall at home.  Left anterior seventh rib  . Gout   . Hemorrhoids   . Hyperlipidemia   . Hypertension   . LV dysfunction    EF 40-45%  . OSA (obstructive sleep apnea)    CPAP machine   . PVC's (premature ventricular contractions)     Past Surgical History:  Procedure Laterality Date  . CARDIOVASCULAR STRESS TEST  03/02/2010   EF 50%  . US ECHOCARDIOGRAPHY  11/15/2009   EF 40-45%    Social History   Socioeconomic History  .  Marital status: Married    Spouse name: Not on file  . Number of children: 3  . Years of education: Not on file  . Highest education level: Not on file  Occupational History  . Occupation: Retired    Fish farm manager: RETIRED    Comment: [employer]   Social Needs  . Financial resource strain: Not on file  . Food insecurity:    Worry: Not on file    Inability: Not on file  . Transportation needs:    Medical: Not on file    Non-medical: Not on file  Tobacco Use  . Smoking status: Former Smoker    Packs/day: 1.00    Years: 16.00    Pack years: 16.00    Types: Cigarettes    Last attempt to quit: 06/30/1977    Years since quitting: 40.2  . Smokeless  tobacco: Never Used  Substance and Sexual Activity  . Alcohol use: No  . Drug use: No  . Sexual activity: Not on file  Lifestyle  . Physical activity:    Days per week: Not on file    Minutes per session: Not on file  . Stress: Not on file  Relationships  . Social connections:    Talks on phone: Not on file    Gets together: Not on file    Attends religious service: Not on file    Active member of club or organization: Not on file    Attends meetings of clubs or organizations: Not on file    Relationship status: Not on file  Other Topics Concern  . Not on file  Social History Narrative   2 caffeine drinks daily     Family History  Problem Relation Age of Onset  . Hypertension Father   . Heart attack Father        Age 42 (MI)  . Heart failure Father   . Colonic polyp Sister        and Father  . Stroke Mother   . Colon cancer Neg Hx   . Stomach cancer Neg Hx     Review of Systems  Constitutional: Negative for chills and fever.  Respiratory: Positive for cough (just first thing in the morning) and shortness of breath (stairs). Negative for wheezing.   Cardiovascular: Positive for leg swelling (chronic ). Negative for chest pain and palpitations.  Neurological: Negative for light-headedness and headaches.       Objective:   Vitals:   09/21/17 0942  BP: (!) 154/92  Pulse: (!) 56  Resp: 18  Temp: 97.7 F (36.5 C)  SpO2: 96%   BP Readings from Last 3 Encounters:  09/21/17 (!) 154/92  09/03/17 114/72  05/08/17 130/84   Wt Readings from Last 3 Encounters:  09/21/17 229 lb (103.9 kg)  09/03/17 234 lb (106.1 kg)  05/08/17 236 lb 6.4 oz (107.2 kg)   Body mass index is 35.87 kg/m.   Physical Exam    Constitutional: Appears well-developed and well-nourished. No distress.  HENT:  Head: Normocephalic and atraumatic.  Neck: Neck supple. No tracheal deviation present. No thyromegaly present.  No cervical lymphadenopathy Cardiovascular: Normal rate, regular  rhythm and normal heart sounds.   No murmur heard. No carotid bruit .  Mild chronic edema with chronic skin changes of venous insufficiency Pulmonary/Chest: Effort normal and breath sounds normal. No respiratory distress. No has no wheezes. No rales.  Skin: Skin is warm and dry. Not diaphoretic.  Psychiatric: Normal mood and affect. Behavior is normal.  Assessment & Plan:    See Problem List for Assessment and Plan of chronic medical problems.

## 2017-09-20 NOTE — Patient Instructions (Addendum)
Work on increasing your exercise and work on [employer] loss.   Test(s) ordered today. Your results will be released to [place] (or called to you) after review, usually within 72hours after test completion. If any changes need to be made, you will be notified at that same time.   Medications reviewed and updated.  No changes recommended at this time.  Your prescription(s) have been submitted to your pharmacy. Please take as directed and contact our office if you believe you are having problem(s) with the medication(s).   Please followup in 6 months

## 2017-09-21 ENCOUNTER — Other Ambulatory Visit (INDEPENDENT_AMBULATORY_CARE_PROVIDER_SITE_OTHER): Payer: Medicare Other

## 2017-09-21 ENCOUNTER — Encounter: Payer: Self-pay | Admitting: Internal Medicine

## 2017-09-21 ENCOUNTER — Ambulatory Visit (INDEPENDENT_AMBULATORY_CARE_PROVIDER_SITE_OTHER): Payer: Medicare Other | Admitting: Internal Medicine

## 2017-09-21 VITALS — BP 154/92 | HR 56 | Temp 97.7°F | Resp 18 | Ht 67.0 in | Wt 229.0 lb

## 2017-09-21 DIAGNOSIS — Z1159 Encounter for screening for other viral diseases: Secondary | ICD-10-CM

## 2017-09-21 DIAGNOSIS — R7303 Prediabetes: Secondary | ICD-10-CM

## 2017-09-21 DIAGNOSIS — I10 Essential (primary) hypertension: Secondary | ICD-10-CM | POA: Diagnosis not present

## 2017-09-21 DIAGNOSIS — I482 Chronic atrial fibrillation, unspecified: Secondary | ICD-10-CM

## 2017-09-21 DIAGNOSIS — E7800 Pure hypercholesterolemia, unspecified: Secondary | ICD-10-CM

## 2017-09-21 DIAGNOSIS — F419 Anxiety disorder, unspecified: Secondary | ICD-10-CM | POA: Diagnosis not present

## 2017-09-21 LAB — CBC WITH DIFFERENTIAL/PLATELET
Basophils Absolute: 0.1 10*3/uL (ref 0.0–0.1)
Basophils Relative: 1.1 % (ref 0.0–3.0)
EOS PCT: 3.1 % (ref 0.0–5.0)
Eosinophils Absolute: 0.3 10*3/uL (ref 0.0–0.7)
HCT: 42.1 % (ref 39.0–52.0)
Hemoglobin: 14.3 g/dL (ref 13.0–17.0)
LYMPHS ABS: 2.2 10*3/uL (ref 0.7–4.0)
Lymphocytes Relative: 28.1 % (ref 12.0–46.0)
MCHC: 33.9 g/dL (ref 30.0–36.0)
MCV: 96.9 fl (ref 78.0–100.0)
MONO ABS: 0.7 10*3/uL (ref 0.1–1.0)
MONOS PCT: 8.6 % (ref 3.0–12.0)
NEUTROS ABS: 4.7 10*3/uL (ref 1.4–7.7)
NEUTROS PCT: 59.1 % (ref 43.0–77.0)
PLATELETS: 215 10*3/uL (ref 150.0–400.0)
RBC: 4.35 Mil/uL (ref 4.22–5.81)
RDW: 15.9 % — ABNORMAL HIGH (ref 11.5–15.5)
WBC: 8 10*3/uL (ref 4.0–10.5)

## 2017-09-21 LAB — COMPREHENSIVE METABOLIC PANEL
ALK PHOS: 65 U/L (ref 39–117)
ALT: 13 U/L (ref 0–53)
AST: 17 U/L (ref 0–37)
Albumin: 4.3 g/dL (ref 3.5–5.2)
BUN: 16 mg/dL (ref 6–23)
CO2: 30 meq/L (ref 19–32)
Calcium: 9.9 mg/dL (ref 8.4–10.5)
Chloride: 103 mEq/L (ref 96–112)
Creatinine, Ser: 1.13 mg/dL (ref 0.40–1.50)
GFR: 66.69 mL/min (ref >60.00)
GLUCOSE: 122 mg/dL — AB (ref 70–99)
POTASSIUM: 4.5 meq/L (ref 3.5–5.1)
Sodium: 140 mEq/L (ref 135–145)
TOTAL PROTEIN: 7.4 g/dL (ref 6.0–8.3)
Total Bilirubin: 0.9 mg/dL (ref 0.2–1.2)

## 2017-09-21 LAB — LIPID PANEL
CHOLESTEROL: 158 mg/dL (ref 0–200)
HDL: 54.9 mg/dL (ref >39.00)
LDL CALC: 90 mg/dL (ref 0–99)
NONHDL: 102.89
Total CHOL/HDL Ratio: 3
Triglycerides: 66 mg/dL (ref 0.0–149.0)
VLDL: 13.2 mg/dL (ref 0.0–40.0)

## 2017-09-21 LAB — HEMOGLOBIN A1C: HEMOGLOBIN A1C: 6.1 % (ref 4.6–6.5)

## 2017-09-21 MED ORDER — PAROXETINE HCL 20 MG PO TABS: 10.0000 mg | ORAL_TABLET | Freq: Every day | ORAL | 1 refills | Status: DC

## 2017-09-21 MED ORDER — ATORVASTATIN CALCIUM 10 MG PO TABS: 10.0000 mg | ORAL_TABLET | Freq: Every day | ORAL | 1 refills | Status: DC

## 2017-09-21 MED ORDER — BENAZEPRIL HCL 40 MG PO TABS: 40.0000 mg | ORAL_TABLET | Freq: Every day | ORAL | 1 refills | Status: DC

## 2017-09-21 NOTE — Assessment & Plan Note (Signed)
Controlled, stable Continue current dose of Paxil

## 2017-09-21 NOTE — Assessment & Plan Note (Signed)
BP elevated here today - he feels that is from walking back from the waiting room - was better the past couple of times checked Stressed the need for more regular exercise and weight loss Encouraged to monitor blood pressure No change of medications today CMP

## 2017-09-21 NOTE — Assessment & Plan Note (Signed)
Check a1c Low sugar / carb diet Stressed regular exercise, weight loss  

## 2017-09-21 NOTE — Assessment & Plan Note (Signed)
Asymptomatic  In sinus here On pradaxa, lopressor Cbc, cmp

## 2017-09-21 NOTE — Assessment & Plan Note (Signed)
Check lipid panel  Continue daily statin Regular exercise and healthy diet encouraged  

## 2017-09-22 LAB — HEPATITIS C ANTIBODY
HEP C AB: NONREACTIVE
SIGNAL TO CUT-OFF: 0.01 (ref <1.00)

## 2017-09-23 ENCOUNTER — Encounter: Payer: Self-pay | Admitting: Internal Medicine

## 2017-11-08 DIAGNOSIS — Z23 Encounter for immunization: Secondary | ICD-10-CM | POA: Diagnosis not present

## 2017-11-13 ENCOUNTER — Encounter: Payer: Self-pay | Admitting: Internal Medicine

## 2017-12-03 ENCOUNTER — Other Ambulatory Visit: Payer: Self-pay | Admitting: Cardiology

## 2017-12-24 ENCOUNTER — Ambulatory Visit (INDEPENDENT_AMBULATORY_CARE_PROVIDER_SITE_OTHER): Payer: Medicare Other | Admitting: Family Medicine

## 2017-12-24 ENCOUNTER — Encounter: Payer: Self-pay | Admitting: Family Medicine

## 2017-12-24 ENCOUNTER — Ambulatory Visit: Payer: Self-pay | Admitting: *Deleted

## 2017-12-24 ENCOUNTER — Ambulatory Visit (INDEPENDENT_AMBULATORY_CARE_PROVIDER_SITE_OTHER): Payer: Medicare Other

## 2017-12-24 VITALS — BP 150/120 | HR 98 | Temp 99.0°F | Ht 67.0 in | Wt 228.4 lb

## 2017-12-24 DIAGNOSIS — M25561 Pain in right knee: Secondary | ICD-10-CM

## 2017-12-24 MED ORDER — CEFTRIAXONE SODIUM 500 MG IJ SOLR
500.0000 mg | Freq: Once | INTRAMUSCULAR | Status: AC
  Administered 2017-12-24: 500 mg via INTRAMUSCULAR

## 2017-12-24 MED ORDER — CEFTRIAXONE SODIUM 1 G IJ SOLR: 1.0000 g | Freq: Once | INTRAMUSCULAR | Status: DC

## 2017-12-24 MED ORDER — CEPHALEXIN 500 MG PO CAPS: 500.0000 mg | ORAL_CAPSULE | Freq: Two times a day (BID) | ORAL | 0 refills | Status: DC

## 2017-12-24 NOTE — Progress Notes (Signed)
Samuel Moyer. - 77 y.o. male MRN [id number]  Date of birth: 1940/10/23  SUBJECTIVE:  Including CC & ROS.  Chief Complaint  Patient presents with  . Knee Pain    right knee for two days    Samuel Moyer. is a 77 y.o. male that is presenting with right knee pain.  Patient's pain has been present for 2 days.  The pain is severe in nature.  Denies any inciting event.  The pain is constant.  He is having redness over the anterior aspect of the kneecap.  He denies any injury.  He denies any fevers or chills.  The pain is present with any movement or sitting still.  He reports a history of gout.  His last attack was 3 to 4 years ago.  He has not had any improvement with modalities.    Review of Systems  Constitutional: Negative for fever.  HENT: Negative for congestion.   Respiratory: Negative for cough.   Cardiovascular: Negative for chest pain.  Gastrointestinal: Negative for abdominal pain.  Musculoskeletal: Positive for arthralgias and gait problem.  Skin: Positive for color change.  Neurological: Negative for weakness.  Hematological: Negative for adenopathy.  Psychiatric/Behavioral: Negative for agitation.    HISTORY: Past Medical, Surgical, Social, and Family History Reviewed & Updated per EMR.   Pertinent Historical Findings include:  Past Medical History:  Diagnosis Date  . A-fib ([place])   . Anxiety   . Atrial fibrillation ([place])   . CHF (congestive heart failure) ([place])   . Claustrophobia    Occasionally when flying   . Colitis   . Diabetes mellitus, type 2 ([place])   . Fracture of one rib, left side, initial encounter for closed fracture 12/27/2016   Occurred 12/21/16 after a fall at home.  Left anterior seventh rib  . Gout   . Hemorrhoids   . Hyperlipidemia   . Hypertension   . LV dysfunction    EF 40-45%  . OSA (obstructive sleep apnea)    CPAP machine   . PVC's (premature ventricular contractions)     Past Surgical History:  Procedure Laterality Date  .  CARDIOVASCULAR STRESS TEST  03/02/2010   EF 50%  . US ECHOCARDIOGRAPHY  11/15/2009   EF 40-45%    No Known Allergies  Family History  Problem Relation Age of Onset  . Hypertension Father   . Heart attack Father        Age 38 (MI)  . Heart failure Father   . Colonic polyp Sister        and Father  . Stroke Mother   . Colon cancer Neg Hx   . Stomach cancer Neg Hx      Social History   Socioeconomic History  . Marital status: Married    Spouse name: Not on file  . Number of children: 3  . Years of education: Not on file  . Highest education level: Not on file  Occupational History  . Occupation: Retired    Fish farm manager: RETIRED    Comment: [employer]   Social Needs  . Financial resource strain: Not on file  . Food insecurity:    Worry: Not on file    Inability: Not on file  . Transportation needs:    Medical: Not on file    Non-medical: Not on file  Tobacco Use  . Smoking status: Former Smoker    Packs/day: 1.00    Years: 16.00    Pack years: 16.00    Types:  Cigarettes    Last attempt to quit: 06/30/1977    Years since quitting: 40.5  . Smokeless tobacco: Never Used  Substance and Sexual Activity  . Alcohol use: No  . Drug use: No  . Sexual activity: Not on file  Lifestyle  . Physical activity:    Days per week: Not on file    Minutes per session: Not on file  . Stress: Not on file  Relationships  . Social connections:    Talks on phone: Not on file    Gets together: Not on file    Attends religious service: Not on file    Active member of club or organization: Not on file    Attends meetings of clubs or organizations: Not on file    Relationship status: Not on file  . Intimate partner violence:    Fear of current or ex partner: Not on file    Emotionally abused: Not on file    Physically abused: Not on file    Forced sexual activity: Not on file  Other Topics Concern  . Not on file  Social History Narrative   2 caffeine drinks daily       PHYSICAL EXAM:  VS: BP (!) 150/120 (BP Location: Right Arm, Patient Position: Sitting, Cuff Size: Normal)   Pulse 98   Temp 99 F (37.2 C) (Oral)   Ht 5\' 7"  (1.702 m)   Wt 228 lb 6.4 oz (103.6 kg)   SpO2 98%   BMI 35.77 kg/m  Physical Exam Gen: NAD, alert, cooperative with exam, well-appearing ENT: normal lips, normal nasal mucosa,  Eye: normal EOM, normal conjunctiva and lids CV:  no edema, +2 pedal pulses   Resp: no accessory muscle use, non-labored,   Skin: no rashes, no areas of induration  Neuro: normal tone, normal sensation to touch Psych:  normal insight, alert and oriented MSK:  Right knee: Effusion appears to be present over the anterior aspect of the knee.  Redness and warmth are present. No tenderness palpation of the medial or lateral joint line. Mild instability with valgus stress testing. Normal range of motion. Normal strength resistance. Negative McMurray's test. Neurovascular intact  Limited ultrasound: Right knee:  Soft tissue swelling is superficial to the quadricep tendon and kneecap and patellar tendon.  There is no joint effusion. The quadricep and patellar tendon appear to be normal.  Summary: Findings are suggestive of an overlying cellulitis.  Does not appear to be a septic joint or gouty flare  Ultrasound and interpretation by Clearance Coots, MD      ASSESSMENT & PLAN:   Acute pain of right knee Pain seems to be consistent with an overlying cellulitis.  Does not appear to be in the joint.  Less likely for septic arthritis or gouty flare. -IM ceftriaxone. -Keflex. -Given indications to follow-up and let us know how he is doing.  Given indications to seek immediate care.

## 2017-12-24 NOTE — Telephone Encounter (Signed)
Patient is calling with knee pain and swelling. He reports redness in the knee as well. It is difficult to walk. Patient does not recall hurting it or twisting it in any way. Reason for Disposition . [1] Redness AND [2] painful when touched AND [3] no fever  Answer Assessment - Initial Assessment Questions 1. LOCATION: "Where is the swelling located?"  (e.g., left, right, both knees)     Right knee 2. SIZE and DESCRIPTION: "What does the swelling look like?"  (e.g., entire knee, localized)     1 1/2 size normal size 3. ONSET: "When did the swelling start?" "Does it come and go, or is it there all the time?"     2 days ago- swelling got worse yesterday 4. PAIN: "Is there any pain?" If so, ask: "How bad is it?" (Scale 1-10; or mild, moderate, severe)     Yes- 7 5. SETTING: "Has there been any recent work, exercise or other activity that involved that part of the body?"      no 6. AGGRAVATING FACTORS: "What makes the knee swelling worse?" (e.g., walking, climbing stairs, running)     Just came on- ? cause 7. ASSOCIATED SYMPTOMS: "Is there any pain or redness?"     Redness- couple inches above the knee cap 8. OTHER SYMPTOMS: "Do you have any other symptoms?" (e.g., chest pain, difficulty breathing, fever, calf pain)     Patient feels warm 9. PREGNANCY: "Is there any chance you are pregnant?" "When was your last menstrual period?"     n/a  Protocols used: [facility]

## 2017-12-24 NOTE — Patient Instructions (Signed)
Nice to meet you  Please start the antibiotic tomorrow  Please let me know if you are not getting any better in the next 1-2 days.  Please go to the emergency room if your symptoms worsen.

## 2017-12-24 NOTE — Telephone Encounter (Signed)
Samuel Moyer, see if he would be willing to see Dr. Raeford Razor today; he might need an injection.

## 2017-12-25 DIAGNOSIS — M25561 Pain in right knee: Secondary | ICD-10-CM | POA: Insufficient documentation

## 2017-12-25 NOTE — Assessment & Plan Note (Signed)
Pain seems to be consistent with an overlying cellulitis.  Does not appear to be in the joint.  Less likely for septic arthritis or gouty flare. -IM ceftriaxone. -Keflex. -Given indications to follow-up and let us know how he is doing.  Given indications to seek immediate care.

## 2017-12-29 ENCOUNTER — Other Ambulatory Visit: Payer: Self-pay | Admitting: Cardiology

## 2018-01-03 NOTE — Progress Notes (Signed)
Subjective:    Patient ID: Samuel Moyer., male    DOB: Mar 12, 1940, 77 y.o.   MRN: [id number]  HPI The patient is here for follow up.  He was seen 11/25 by Dr. Raeford Razor and was diagnosed with cellulitis of the right knee.  He received an injection of ceftriaxone and he was started on Keflex and completed it.  His knee has improved significantly-when he was first seen he was very swollen, red and warm to touch.  He still has some swelling, achiness and warmth to the knee and he wonders if the infection is completely gone.  He has been off the antibiotic for 3 days.  He has not had any fevers or chills.  He has better range of motion of the knee and is able to walk on it.   Medications and allergies reviewed with patient and updated if appropriate.  Patient Active Problem List   Diagnosis Date Noted  . Acute pain of right knee 12/25/2017  . Fall 12/27/2016  . Gout attack 06/03/2015  . Prediabetes 03/05/2015  . Obesity 12/28/2014  . Venous (peripheral) insufficiency 02/16/2014  . OSA (obstructive sleep apnea) 05/24/2010  . ATRIAL FIBRILLATION  01/26/2010  . Chronic systolic heart failure ([place]) 01/26/2010  . Asthma 01/14/2010  . Hypercholesterolemia 10/21/2007  . Anxiety 10/21/2007  . Essential hypertension 02/26/2006  . COLONIC POLYPS, HX OF 02/26/2006    Current Outpatient Medications on File Prior to Visit  Medication Sig Dispense Refill  . amLODipine (NORVASC) 5 MG tablet TAKE ONE AND ONE-HALF TABLETS DAILY 135 tablet 0  . atorvastatin (LIPITOR) 10 MG tablet Take 1 tablet (10 mg total) by mouth daily. 90 tablet 1  . benazepril (LOTENSIN) 40 MG tablet Take 1 tablet (40 mg total) by mouth daily. 90 tablet 1  . dabigatran (PRADAXA) 150 MG CAPS capsule TAKE 1 CAPSULE EVERY 12 HOURS 180 capsule 3  . furosemide (LASIX) 40 MG tablet TAKE 1 TABLET DAILY 90 tablet 3  . glucose blood (ONE TOUCH ULTRA TEST) test strip Check blood sugar daily prn as directed 100 each 0  . LUMIGAN 0.01 %  SOLN     . metoprolol tartrate (LOPRESSOR) 25 MG tablet TAKE 2 TABLETS TWICE A DAY (NEED OFFICE VISIT) (SCHEDULE AN APPOINTMENT FOR FUTURE REFILLS) 360 tablet 2  . Multiple Vitamin (MULTIVITAMIN) tablet Take 1 tablet by mouth daily.      [name] DELICA LANCETS 65K MISC 1 each by Other route daily. Check blood sugar daily as directed 100 each 0  . PARoxetine (PAXIL) 20 MG tablet Take 0.5 tablets (10 mg total) by mouth daily. 45 tablet 1   No current facility-administered medications on file prior to visit.     Past Medical History:  Diagnosis Date  . A-fib ([place])   . Anxiety   . Atrial fibrillation ([place])   . CHF (congestive heart failure) ([place])   . Claustrophobia    Occasionally when flying   . Colitis   . Diabetes mellitus, type 2 ([place])   . Fracture of one rib, left side, initial encounter for closed fracture 12/27/2016   Occurred 12/21/16 after a fall at home.  Left anterior seventh rib  . Gout   . Hemorrhoids   . Hyperlipidemia   . Hypertension   . LV dysfunction    EF 40-45%  . OSA (obstructive sleep apnea)    CPAP machine   . PVC's (premature ventricular contractions)     Past Surgical History:  Procedure Laterality  Date  . CARDIOVASCULAR STRESS TEST  03/02/2010   EF 50%  . US ECHOCARDIOGRAPHY  11/15/2009   EF 40-45%    Social History   Socioeconomic History  . Marital status: Married    Spouse name: Not on file  . Number of children: 3  . Years of education: Not on file  . Highest education level: Not on file  Occupational History  . Occupation: Retired    Fish farm manager: RETIRED    Comment: [employer]   Social Needs  . Financial resource strain: Not on file  . Food insecurity:    Worry: Not on file    Inability: Not on file  . Transportation needs:    Medical: Not on file    Non-medical: Not on file  Tobacco Use  . Smoking status: Former Smoker    Packs/day: 1.00    Years: 16.00    Pack years: 16.00    Types: Cigarettes    Last attempt to quit:  06/30/1977    Years since quitting: 40.5  . Smokeless tobacco: Never Used  Substance and Sexual Activity  . Alcohol use: No  . Drug use: No  . Sexual activity: Not on file  Lifestyle  . Physical activity:    Days per week: Not on file    Minutes per session: Not on file  . Stress: Not on file  Relationships  . Social connections:    Talks on phone: Not on file    Gets together: Not on file    Attends religious service: Not on file    Active member of club or organization: Not on file    Attends meetings of clubs or organizations: Not on file    Relationship status: Not on file  Other Topics Concern  . Not on file  Social History Narrative   2 caffeine drinks daily     Family History  Problem Relation Age of Onset  . Hypertension Father   . Heart attack Father        Age 59 (MI)  . Heart failure Father   . Colonic polyp Sister        and Father  . Stroke Mother   . Colon cancer Neg Hx   . Stomach cancer Neg Hx     Review of Systems  Constitutional: Negative for chills and fever.  Musculoskeletal: Positive for arthralgias and joint swelling.  Skin: Positive for color change.  Neurological: Negative for light-headedness and headaches.       Objective:   Vitals:   01/04/18 0958  BP: (!) 160/80  Pulse: 94  Resp: 16  Temp: 98.1 F (36.7 C)  SpO2: 97%   BP Readings from Last 3 Encounters:  01/04/18 (!) 160/80  12/24/17 (!) 150/120  09/21/17 (!) 154/92   Wt Readings from Last 3 Encounters:  01/04/18 229 lb (103.9 kg)  12/24/17 228 lb 6.4 oz (103.6 kg)  09/21/17 229 lb (103.9 kg)   Body mass index is 35.87 kg/m.   Physical Exam    Constitutional: Appears well-developed and well-nourished. No distress.  Right knee: Mild effusion, minimal tenderness with palpation, minimal erythema-generalized, warmth throughout the knee.  Good range of motion of knee without pain Lower extremities: No edema     Assessment & Plan:    See Problem List for  Assessment and Plan of chronic medical problems.

## 2018-01-04 ENCOUNTER — Ambulatory Visit (INDEPENDENT_AMBULATORY_CARE_PROVIDER_SITE_OTHER): Payer: Medicare Other | Admitting: Internal Medicine

## 2018-01-04 ENCOUNTER — Encounter: Payer: Self-pay | Admitting: Internal Medicine

## 2018-01-04 DIAGNOSIS — L03115 Cellulitis of right lower limb: Secondary | ICD-10-CM | POA: Insufficient documentation

## 2018-01-04 MED ORDER — CEPHALEXIN 500 MG PO CAPS: 500.0000 mg | ORAL_CAPSULE | Freq: Two times a day (BID) | ORAL | 0 refills | Status: DC

## 2018-01-04 NOTE — Assessment & Plan Note (Signed)
Concern regarding still mild persistent cellulitis Possibility of gout is still there We will treat with an additional 5 days of Keflex-twice daily He will monitor symptoms closely and if they do not resolve he will let me know

## 2018-01-04 NOTE — Patient Instructions (Addendum)
Take the antibiotic for an additional 5 days to make sure your infection is completely gone.    If you are still having symptoms we may need to treat you for gout.

## 2018-01-14 NOTE — Progress Notes (Signed)
Subjective:    Patient ID: Samuel Moyer., male    DOB: [birth year], 77 y.o.   MRN: [id number]  HPI He is here for an acute visit for cold symptoms.  His symptoms started around 12/6  He is experiencing productive cough with light green sputum, wheeze, SOB, nasal congestion, fever, chills  He denies ear pain, sinus pain, sore throat, headaches, lightlessness, nausea and myalgias.    He has tried taking delsym   Medications and allergies reviewed with patient and updated if appropriate.  Patient Active Problem List   Diagnosis Date Noted  . Cellulitis of right knee 01/04/2018  . Acute pain of right knee 12/25/2017  . Fall 12/27/2016  . Gout attack 06/03/2015  . Prediabetes 03/05/2015  . Obesity 12/28/2014  . Venous (peripheral) insufficiency 02/16/2014  . OSA (obstructive sleep apnea) 05/24/2010  . ATRIAL FIBRILLATION  01/26/2010  . Chronic systolic heart failure ([place]) 01/26/2010  . Asthma 01/14/2010  . Hypercholesterolemia 10/21/2007  . Anxiety 10/21/2007  . Essential hypertension 02/26/2006  . COLONIC POLYPS, HX OF 02/26/2006    Current Outpatient Medications on File Prior to Visit  Medication Sig Dispense Refill  . amLODipine (NORVASC) 5 MG tablet TAKE ONE AND ONE-HALF TABLETS DAILY 135 tablet 0  . atorvastatin (LIPITOR) 10 MG tablet Take 1 tablet (10 mg total) by mouth daily. 90 tablet 1  . benazepril (LOTENSIN) 40 MG tablet Take 1 tablet (40 mg total) by mouth daily. 90 tablet 1  . dabigatran (PRADAXA) 150 MG CAPS capsule TAKE 1 CAPSULE EVERY 12 HOURS 180 capsule 3  . furosemide (LASIX) 40 MG tablet TAKE 1 TABLET DAILY 90 tablet 3  . glucose blood (ONE TOUCH ULTRA TEST) test strip Check blood sugar daily prn as directed 100 each 0  . LUMIGAN 0.01 % SOLN     . metoprolol tartrate (LOPRESSOR) 25 MG tablet TAKE 2 TABLETS TWICE A DAY (NEED OFFICE VISIT) (SCHEDULE AN APPOINTMENT FOR FUTURE REFILLS) 360 tablet 2  . Multiple Vitamin (MULTIVITAMIN) tablet Take 1 tablet  by mouth daily.      [name] DELICA LANCETS 15V MISC 1 each by Other route daily. Check blood sugar daily as directed 100 each 0  . PARoxetine (PAXIL) 20 MG tablet Take 0.5 tablets (10 mg total) by mouth daily. 45 tablet 1   No current facility-administered medications on file prior to visit.     Past Medical History:  Diagnosis Date  . A-fib ([place])   . Anxiety   . Atrial fibrillation ([place])   . CHF (congestive heart failure) ([place])   . Claustrophobia    Occasionally when flying   . Colitis   . Diabetes mellitus, type 2 ([place])   . Fracture of one rib, left side, initial encounter for closed fracture 12/27/2016   Occurred 12/21/16 after a fall at home.  Left anterior seventh rib  . Gout   . Hemorrhoids   . Hyperlipidemia   . Hypertension   . LV dysfunction    EF 40-45%  . OSA (obstructive sleep apnea)    CPAP machine   . PVC's (premature ventricular contractions)     Past Surgical History:  Procedure Laterality Date  . CARDIOVASCULAR STRESS TEST  03/02/2010   EF 50%  . US ECHOCARDIOGRAPHY  11/15/2009   EF 40-45%    Social History   Socioeconomic History  . Marital status: Married    Spouse name: Not on file  . Number of children: 3  . Years of education:  Not on file  . Highest education level: Not on file  Occupational History  . Occupation: Retired    Fish farm manager: RETIRED    Comment: [employer]   Social Needs  . Financial resource strain: Not on file  . Food insecurity:    Worry: Not on file    Inability: Not on file  . Transportation needs:    Medical: Not on file    Non-medical: Not on file  Tobacco Use  . Smoking status: Former Smoker    Packs/day: 1.00    Years: 16.00    Pack years: 16.00    Types: Cigarettes    Last attempt to quit: 06/30/1977    Years since quitting: 40.5  . Smokeless tobacco: Never Used  Substance and Sexual Activity  . Alcohol use: No  . Drug use: No  . Sexual activity: Not on file  Lifestyle  . Physical activity:    Days  per week: Not on file    Minutes per session: Not on file  . Stress: Not on file  Relationships  . Social connections:    Talks on phone: Not on file    Gets together: Not on file    Attends religious service: Not on file    Active member of club or organization: Not on file    Attends meetings of clubs or organizations: Not on file    Relationship status: Not on file  Other Topics Concern  . Not on file  Social History Narrative   2 caffeine drinks daily     Family History  Problem Relation Age of Onset  . Hypertension Father   . Heart attack Father        Age 51 (MI)  . Heart failure Father   . Colonic polyp Sister        and Father  . Stroke Mother   . Colon cancer Neg Hx   . Stomach cancer Neg Hx     Review of Systems  Constitutional: Positive for chills and fever (Tmax 101).  HENT: Positive for congestion. Negative for ear pain, sinus pain and sore throat.   Respiratory: Positive for cough, shortness of breath and wheezing.   Gastrointestinal: Negative for diarrhea and nausea.  Musculoskeletal: Negative for myalgias.  Neurological: Negative for light-headedness and headaches.       Objective:   Vitals:   01/15/18 1308  BP: (!) 154/82  Pulse: 78  Resp: 18  Temp: 99.9 F (37.7 C)  SpO2: 96%   Filed Weights   01/15/18 1308  Weight: 223 lb (101.2 kg)   Body mass index is 34.93 kg/m.  Wt Readings from Last 3 Encounters:  01/15/18 223 lb (101.2 kg)  01/04/18 229 lb (103.9 kg)  12/24/17 228 lb 6.4 oz (103.6 kg)     Physical Exam GENERAL APPEARANCE: Appears stated age, well appearing, NAD EYES: conjunctiva clear, no icterus HEENT: bilateral tympanic membranes and ear canals normal, oropharynx with mild erythema, no thyromegaly, trachea midline, no cervical or supraclavicular lymphadenopathy LUNGS: unlabored breathing, good air entry, mild exp wheeze on right more than left, no crackles CARDIOVASCULAR: Normal S1,S2 without murmurs, no edema SKIN:  warm, dry        Assessment & Plan:   See Problem List for Assessment and Plan of chronic medical problems.

## 2018-01-15 ENCOUNTER — Other Ambulatory Visit: Payer: Medicare Other

## 2018-01-15 ENCOUNTER — Ambulatory Visit (INDEPENDENT_AMBULATORY_CARE_PROVIDER_SITE_OTHER)
Admission: RE | Admit: 2018-01-15 | Discharge: 2018-01-15 | Disposition: A | Discharge status: Home / Self Care | Payer: Medicare Other | Source: Ambulatory Visit | Attending: Internal Medicine | Admitting: Internal Medicine

## 2018-01-15 ENCOUNTER — Encounter: Payer: Self-pay | Admitting: Internal Medicine

## 2018-01-15 ENCOUNTER — Ambulatory Visit (INDEPENDENT_AMBULATORY_CARE_PROVIDER_SITE_OTHER): Payer: Medicare Other | Admitting: Internal Medicine

## 2018-01-15 VITALS — BP 154/82 | HR 78 | Temp 99.9°F | Resp 18 | Ht 67.0 in | Wt 223.0 lb

## 2018-01-15 DIAGNOSIS — R0602 Shortness of breath: Secondary | ICD-10-CM | POA: Diagnosis not present

## 2018-01-15 DIAGNOSIS — J209 Acute bronchitis, unspecified: Secondary | ICD-10-CM | POA: Diagnosis not present

## 2018-01-15 DIAGNOSIS — R05 Cough: Secondary | ICD-10-CM | POA: Diagnosis not present

## 2018-01-15 MED ORDER — DOXYCYCLINE HYCLATE 100 MG PO TABS: 100.0000 mg | ORAL_TABLET | Freq: Two times a day (BID) | ORAL | 0 refills | Status: DC

## 2018-01-15 NOTE — Patient Instructions (Signed)
Have a chest xray today and we will call you with the results.    Take the antibiotic as prescribed - complete the entire course.  Use the delsym cough syrup as needed.  Continue over the counter cold medication and tylenol.  Increase your fluids and rest.    Call if no improvement

## 2018-01-15 NOTE — Assessment & Plan Note (Signed)
CXR today to r/o PNA Likely bacterial  Start doxycycline Delsym  otc cold medications, tylenol Rest, fluid Call if no improvement

## 2018-02-04 DIAGNOSIS — H401131 Primary open-angle glaucoma, bilateral, mild stage: Secondary | ICD-10-CM | POA: Diagnosis not present

## 2018-02-15 ENCOUNTER — Encounter: Payer: Self-pay | Admitting: Internal Medicine

## 2018-02-15 ENCOUNTER — Ambulatory Visit (INDEPENDENT_AMBULATORY_CARE_PROVIDER_SITE_OTHER): Payer: Medicare Other | Admitting: Internal Medicine

## 2018-02-15 ENCOUNTER — Ambulatory Visit (INDEPENDENT_AMBULATORY_CARE_PROVIDER_SITE_OTHER)
Admission: RE | Admit: 2018-02-15 | Discharge: 2018-02-15 | Disposition: A | Discharge status: Home / Self Care | Payer: Medicare Other | Source: Ambulatory Visit | Attending: Internal Medicine | Admitting: Internal Medicine

## 2018-02-15 VITALS — BP 132/80 | HR 51 | Temp 98.0°F | Resp 18 | Ht 67.0 in | Wt 229.0 lb

## 2018-02-15 DIAGNOSIS — M25562 Pain in left knee: Secondary | ICD-10-CM | POA: Diagnosis not present

## 2018-02-15 DIAGNOSIS — S8992XA Unspecified injury of left lower leg, initial encounter: Secondary | ICD-10-CM | POA: Diagnosis not present

## 2018-02-15 DIAGNOSIS — M25062 Hemarthrosis, left knee: Secondary | ICD-10-CM | POA: Diagnosis not present

## 2018-02-15 NOTE — Patient Instructions (Addendum)
Have an x-ray today.    We will wrap the leg - keep it wrapped to help the swelling go down.  Elevate the leg when sitting.    Take tylenol as needed.     Follow up with sports medicine next week.

## 2018-02-15 NOTE — Progress Notes (Signed)
Subjective:    Patient ID: Samuel Baer., male    DOB: Aug 17, 1940, 78 y.o.   MRN: [id number]  HPI The patient is here for an acute visit.  He fell yesterday at 2:30 - he tripped coming out of the bathroom.  He hit his left knee on the door jam.  The left knee is swollen, bruised and painful.  He did walk on his knee last night.  The soreness and swelling peaked last night.   He still has swelling, bruising and pain.  His soreness extends from above the knee, in the knee and below the knee.  Bending the knee is difficult.     He is on pradaxa.    Medications and allergies reviewed with patient and updated if appropriate.  Patient Active Problem List   Diagnosis Date Noted  . Acute bronchitis 01/15/2018  . Cellulitis of right knee 01/04/2018  . Acute pain of right knee 12/25/2017  . Fall 12/27/2016  . Gout attack 06/03/2015  . Prediabetes 03/05/2015  . Obesity 12/28/2014  . Venous (peripheral) insufficiency 02/16/2014  . OSA (obstructive sleep apnea) 05/24/2010  . ATRIAL FIBRILLATION  01/26/2010  . Chronic systolic heart failure ([place]) 01/26/2010  . Asthma 01/14/2010  . Hypercholesterolemia 10/21/2007  . Anxiety 10/21/2007  . Essential hypertension 02/26/2006  . COLONIC POLYPS, HX OF 02/26/2006    Current Outpatient Medications on File Prior to Visit  Medication Sig Dispense Refill  . amLODipine (NORVASC) 5 MG tablet TAKE ONE AND ONE-HALF TABLETS DAILY 135 tablet 0  . atorvastatin (LIPITOR) 10 MG tablet Take 1 tablet (10 mg total) by mouth daily. 90 tablet 1  . benazepril (LOTENSIN) 40 MG tablet Take 1 tablet (40 mg total) by mouth daily. 90 tablet 1  . dabigatran (PRADAXA) 150 MG CAPS capsule TAKE 1 CAPSULE EVERY 12 HOURS 180 capsule 3  . furosemide (LASIX) 40 MG tablet TAKE 1 TABLET DAILY 90 tablet 3  . glucose blood (ONE TOUCH ULTRA TEST) test strip Check blood sugar daily prn as directed 100 each 0  . LUMIGAN 0.01 % SOLN     . metoprolol tartrate (LOPRESSOR) 25 MG  tablet TAKE 2 TABLETS TWICE A DAY (NEED OFFICE VISIT) (SCHEDULE AN APPOINTMENT FOR FUTURE REFILLS) 360 tablet 2  . Multiple Vitamin (MULTIVITAMIN) tablet Take 1 tablet by mouth daily.      [name] DELICA LANCETS 25W MISC 1 each by Other route daily. Check blood sugar daily as directed 100 each 0  . PARoxetine (PAXIL) 20 MG tablet Take 0.5 tablets (10 mg total) by mouth daily. 45 tablet 1   No current facility-administered medications on file prior to visit.     Past Medical History:  Diagnosis Date  . A-fib ([place])   . Anxiety   . Atrial fibrillation ([place])   . CHF (congestive heart failure) ([place])   . Claustrophobia    Occasionally when flying   . Colitis   . Diabetes mellitus, type 2 ([place])   . Fracture of one rib, left side, initial encounter for closed fracture 12/27/2016   Occurred 12/21/16 after a fall at home.  Left anterior seventh rib  . Gout   . Hemorrhoids   . Hyperlipidemia   . Hypertension   . LV dysfunction    EF 40-45%  . OSA (obstructive sleep apnea)    CPAP machine   . PVC's (premature ventricular contractions)     Past Surgical History:  Procedure Laterality Date  . CARDIOVASCULAR STRESS TEST  03/02/2010   EF 50%  . US ECHOCARDIOGRAPHY  11/15/2009   EF 40-45%    Social History   Socioeconomic History  . Marital status: Married    Spouse name: Not on file  . Number of children: 3  . Years of education: Not on file  . Highest education level: Not on file  Occupational History  . Occupation: Retired    Fish farm manager: RETIRED    Comment: [employer]   Social Needs  . Financial resource strain: Not on file  . Food insecurity:    Worry: Not on file    Inability: Not on file  . Transportation needs:    Medical: Not on file    Non-medical: Not on file  Tobacco Use  . Smoking status: Former Smoker    Packs/day: 1.00    Years: 16.00    Pack years: 16.00    Types: Cigarettes    Last attempt to quit: 06/30/1977    Years since quitting: 40.6  .  Smokeless tobacco: Never Used  Substance and Sexual Activity  . Alcohol use: No  . Drug use: No  . Sexual activity: Not on file  Lifestyle  . Physical activity:    Days per week: Not on file    Minutes per session: Not on file  . Stress: Not on file  Relationships  . Social connections:    Talks on phone: Not on file    Gets together: Not on file    Attends religious service: Not on file    Active member of club or organization: Not on file    Attends meetings of clubs or organizations: Not on file    Relationship status: Not on file  Other Topics Concern  . Not on file  Social History Narrative   2 caffeine drinks daily     Family History  Problem Relation Age of Onset  . Hypertension Father   . Heart attack Father        Age 55 (MI)  . Heart failure Father   . Colonic polyp Sister        and Father  . Stroke Mother   . Colon cancer Neg Hx   . Stomach cancer Neg Hx     Review of Systems  Constitutional: Negative for chills and fever.  Musculoskeletal: Positive for arthralgias and joint swelling.  Skin: Positive for color change. Negative for wound.  Neurological: Negative for numbness.       Objective:   Vitals:   02/15/18 1438  BP: 132/80  Pulse: (!) 51  Resp: 18  Temp: 98 F (36.7 C)  SpO2: 99%   BP Readings from Last 3 Encounters:  02/15/18 132/80  01/15/18 (!) 154/82  01/04/18 (!) 160/80   Wt Readings from Last 3 Encounters:  02/15/18 229 lb (103.9 kg)  01/15/18 223 lb (101.2 kg)  01/04/18 229 lb (103.9 kg)   Body mass index is 35.87 kg/m.   Physical Exam    A left knee exam was performed.   SKIN: intact, mild abrasion anterior knee from where he hit the door jam;  Bruising throughout knee and above knee  SWELLING: severe  EFFUSION: yes  WARMTH: mild warmth, toes normal temp TENDERNESS: moderate tenderness on lateral aspect of knee  ROM: dec extension, significantly decreased flexion  GAIT: walks with a limp NEUROLOGICAL EXAM:  normal sensation  CALF TENDERNESS: no  PULSES:  2+ DP pulse    Assessment & Plan:    See Problem List for  Assessment and Plan of chronic medical problems.

## 2018-02-16 DIAGNOSIS — M25062 Hemarthrosis, left knee: Secondary | ICD-10-CM | POA: Insufficient documentation

## 2018-02-16 NOTE — Assessment & Plan Note (Signed)
Hemarthrosis of left knee after fall yesterday - hit knee on door jam, on pradaxa Swelling/bruising above and below knee, significant knee effusion with dec ROM Discussed with Dr Lendell Caprice today to r/o fx Wrap leg F/u early next week with sports med Call if symptoms worsen over the weekend - foot feet cold, swelling worsens

## 2018-02-17 ENCOUNTER — Other Ambulatory Visit: Payer: Self-pay | Admitting: Cardiology

## 2018-02-18 ENCOUNTER — Ambulatory Visit (INDEPENDENT_AMBULATORY_CARE_PROVIDER_SITE_OTHER): Payer: Medicare Other | Admitting: Family Medicine

## 2018-02-18 ENCOUNTER — Other Ambulatory Visit: Payer: Medicare Other

## 2018-02-18 ENCOUNTER — Ambulatory Visit: Payer: Self-pay

## 2018-02-18 ENCOUNTER — Encounter: Payer: Self-pay | Admitting: Family Medicine

## 2018-02-18 ENCOUNTER — Ambulatory Visit
Admission: RE | Admit: 2018-02-18 | Discharge: 2018-02-18 | Disposition: A | Discharge status: Home / Self Care | Payer: Medicare Other | Source: Ambulatory Visit | Attending: Family Medicine | Admitting: Family Medicine

## 2018-02-18 VITALS — BP 146/90 | HR 70 | Ht 67.0 in | Wt 230.0 lb

## 2018-02-18 DIAGNOSIS — M25062 Hemarthrosis, left knee: Secondary | ICD-10-CM | POA: Diagnosis not present

## 2018-02-18 DIAGNOSIS — M25562 Pain in left knee: Secondary | ICD-10-CM

## 2018-02-18 NOTE — Telephone Encounter (Signed)
Rx has been sent to the pharmacy electronically. ° °

## 2018-02-18 NOTE — Patient Instructions (Addendum)
Good to see you  Drained the knee today  Wear the brace daily for next week or 2 Do not need to sleep in it but consider sleeping in a compressoin sleeve  Ice 20 minutes 2 times a day at least  Hold the blood thinner next 48 hours.  See me again 1030 am on Friday (ok to double book)

## 2018-02-18 NOTE — Progress Notes (Signed)
[facility] [place], [place] [address] Phone: [phone] Subjective:    I Kandace Blitz am serving as a Education administrator for Dr. Hulan Saas.   I'm seeing this patient by the request  of:  Binnie Rail, MD   CC: Knee pain  [id number]  Ephriam Turman. is a 78 y.o. male coming in with complaint of left knee pain. Fell on last Thursday. Saw Dr. Quay Burow on last Friday. Swollen. Knee is wrapped. Quad muscle is sore. He feels no pain with non weight bearing.   Onset- Last thursday Location Duration-  Character- sore Aggravating factors- Walking  Reliving factors-  Therapies tried- ice, Dr. Quay Burow told him to start using heat instead of ice. Severity-     Past Medical History:  Diagnosis Date  . A-fib ([place])   . Anxiety   . Atrial fibrillation ([place])   . CHF (congestive heart failure) ([place])   . Claustrophobia    Occasionally when flying   . Colitis   . Diabetes mellitus, type 2 ([place] [place])   . Fracture of one rib, left side, initial encounter for closed fracture 12/27/2016   Occurred 12/21/16 after a fall at home.  Left anterior seventh rib  . Gout   . Hemorrhoids   . Hyperlipidemia   . Hypertension   . LV dysfunction    EF 40-45%  . OSA (obstructive sleep apnea)    CPAP machine   . PVC's (premature ventricular contractions)    Past Surgical History:  Procedure Laterality Date  . CARDIOVASCULAR STRESS TEST  03/02/2010   EF 50%  . US ECHOCARDIOGRAPHY  11/15/2009   EF 40-45%   Social History   Socioeconomic History  . Marital status: Married    Spouse name: Not on file  . Number of children: 3  . Years of education: Not on file  . Highest education level: Not on file  Occupational History  . Occupation: Retired    Fish farm manager: RETIRED    Comment: [employer]   Social Needs  . Financial resource strain: Not on file  . Food insecurity:    Worry: Not on file    Inability: Not on file  . Transportation needs:    Medical: Not  on file    Non-medical: Not on file  Tobacco Use  . Smoking status: Former Smoker    Packs/day: 1.00    Years: 16.00    Pack years: 16.00    Types: Cigarettes    Last attempt to quit: 06/30/1977    Years since quitting: 40.6  . Smokeless tobacco: Never Used  Substance and Sexual Activity  . Alcohol use: No  . Drug use: No  . Sexual activity: Not on file  Lifestyle  . Physical activity:    Days per week: Not on file    Minutes per session: Not on file  . Stress: Not on file  Relationships  . Social connections:    Talks on phone: Not on file    Gets together: Not on file    Attends religious service: Not on file    Active member of club or organization: Not on file    Attends meetings of clubs or organizations: Not on file    Relationship status: Not on file  Other Topics Concern  . Not on file  Social History Narrative   2 caffeine drinks daily    No Known Allergies Family History  Problem Relation Age of Onset  . Hypertension Father   .  Heart attack Father        Age 38 (MI)  . Heart failure Father   . Colonic polyp Sister        and Father  . Stroke Mother   . Colon cancer Neg Hx   . Stomach cancer Neg Hx      Current Outpatient Medications (Cardiovascular):  .  amLODipine (NORVASC) 5 MG tablet, TAKE ONE AND ONE-HALF TABLETS DAILY .  atorvastatin (LIPITOR) 10 MG tablet, Take 1 tablet (10 mg total) by mouth daily. .  benazepril (LOTENSIN) 40 MG tablet, Take 1 tablet (40 mg total) by mouth daily. .  furosemide (LASIX) 40 MG tablet, TAKE 1 TABLET DAILY .  metoprolol tartrate (LOPRESSOR) 25 MG tablet, TAKE 2 TABLETS TWICE A DAY (NEED OFFICE VISIT) (SCHEDULE AN APPOINTMENT FOR FUTURE REFILLS)    Current Outpatient Medications (Hematological):  .  dabigatran (PRADAXA) 150 MG CAPS capsule, TAKE 1 CAPSULE EVERY 12 HOURS  Current Outpatient Medications (Other):  .  glucose blood (ONE TOUCH ULTRA TEST) test strip, Check blood sugar daily prn as directed (Patient  taking differently: Check blood sugar as directed) .  LUMIGAN 0.01 % SOLN,  .  Multiple Vitamin (MULTIVITAMIN) tablet, Take 1 tablet by mouth daily.   [name] DELICA LANCETS 81E MISC, 1 each by Other route daily. Check blood sugar daily as directed (Patient taking differently: 1 each by Other route daily. Check blood sugar PRN as directed) .  PARoxetine (PAXIL) 20 MG tablet, Take 0.5 tablets (10 mg total) by mouth daily.    Past medical history, social, surgical and family history all reviewed in electronic medical record.  No pertanent information unless stated regarding to the chief complaint.   Review of Systems:  No headache, visual changes, nausea, vomiting, diarrhea, constipation, dizziness, abdominal pain, skin rash, fevers, chills, night sweats, weight loss, swollen lymph nodes, body aches, j, chest pain, shortness of breath, mood changes.  Positive muscle aches and joint swelling  Objective  Blood pressure (!) 146/90, pulse 70, height 5\' 7"  (1.702 m), weight 230 lb (104.3 kg), SpO2 91 %.    General: No apparent distress alert and oriented x3 mood and affect normal, dressed appropriately.  HEENT: Pupils equal, extraocular movements intact  Respiratory: Patient's speak in full sentences and does not appear short of breath  Cardiovascular: 2+lower extremity edema with hemosiderin deposits.  Does have swelling more on the left leg than the right, non tender, no erythema  Skin: Warm dry intact with no signs of infection or rash on extremities or on axial skeleton.  Abdomen: Soft nontender  Neuro: Cranial nerves II through XII are intact, neurovascularly intact in all extremities with 2+ DTRs and 2+ pulses.  Lymph: No lymphadenopathy of posterior or anterior cervical chain or axillae bilaterally.  Gait severely antalgic walking with the aid of a cane MSK:  tender with limited range of motion and good stability and symmetric strength and tone of shoulders, elbows, wrist, hip, and  ankles bilaterally.  Knee: Left valgus deformity noted. Large thigh to calf ratio.  Large effusion noted.  Warm to touch. Tender to palpation over medial and PF joint line.  Tender proximally to the knee as well Decreased range of motion of the knee lacking last 10 degrees of extension in the last 20 degrees of flexion painful patellar compression. Patellar glide with moderate crepitus. Patellar and quadriceps tendons unremarkable. Hamstring and quadriceps strength is normal. Contralateral knee shows arthritic changes with mild instability  MSK US performed  of: Left knee This study was ordered, performed, and interpreted by Charlann Boxer D.O.  Knee: Moderate arthritic changes but the patient does have a very large cysts effusion versus possible hemarthrosis.  IMPRESSION: Arthritis status post fall with effusion  Procedure: Real-time Ultrasound Guided Injection of left knee Device: GE Logiq Q7 Ultrasound guided injection is preferred based studies that show increased duration, increased effect, greater accuracy, decreased procedural pain, increased response rate, and decreased cost with ultrasound guided versus blind injection.  Verbal informed consent obtained.  Time-out conducted.  Noted no overlying erythema, induration, or other signs of local infection.  Skin prepped in a sterile fashion.  Local anesthesia: Topical Ethyl chloride.  With sterile technique and under real time ultrasound guidance: With a 22-gauge 2 inch needle patient was injected with 4 cc of 0.5% Marcaine and aspirated 75 cc of straw-colored fluid that did have a cloudy appearance then injected 1 cc of Kenalog 40 mg/dL. This was from a superior lateral approach.  Completed without difficulty  Pain immediately resolved suggesting accurate placement of the medication.  Advised to call if fevers/chills, erythema, induration, drainage, or persistent bleeding.  Images permanently stored and available for review in the  ultrasound unit.  Impression: Technically successful ultrasound guided injection.   Impression and Recommendations:     This case required medical decision making of moderate complexity. The above documentation has been reviewed and is accurate and complete Lyndal Pulley, DO       Note: This dictation was prepared with Dragon dictation along with smaller phrase technology. Any transcriptional errors that result from this process are unintentional.

## 2018-02-18 NOTE — Assessment & Plan Note (Signed)
Patient some presently had more of a synovitis with significant knee effusion then a hemarthrosis.  Aspiration done today and patient did have improvement.  Repeat x-rays in 1 week to rule out any type of delayed tibial plateau that could be contributing.  Patient does have many different comorbidities that will contribute to keep him from healing.  Discussed continuing icing regimen as well as compression.  Patient will follow-up with me again in 4 weeks.

## 2018-02-19 ENCOUNTER — Other Ambulatory Visit: Payer: Self-pay

## 2018-02-19 LAB — SYNOVIAL CELL COUNT + DIFF, W/ CRYSTALS
Basophils, %: 0 %
Eosinophils-Synovial: 0 % (ref 0–2)
Lymphocytes-Synovial Fld: 3 % (ref 0–74)
Monocyte/Macrophage: 12 % (ref 0–69)
Neutrophil, Synovial: 85 % — ABNORMAL HIGH (ref 0–24)
Synoviocytes, %: 0 % (ref 0–15)
WBC, Synovial: 37640 cells/uL — ABNORMAL HIGH (ref <150)

## 2018-02-19 MED ORDER — DOXYCYCLINE HYCLATE 100 MG PO TABS: 100.0000 mg | ORAL_TABLET | Freq: Two times a day (BID) | ORAL | 0 refills | Status: DC

## 2018-02-21 NOTE — Progress Notes (Signed)
[facility] [address] [place], [place] [address] Phone: [phone] Subjective:   [name] No, am serving as a scribe for Dr. Hulan Saas.  I'm seeing this patient by the request  of:    CC: Knee pain follow-up  [id number]  Gibson Lad. is a 78 y.o. male coming in with complaint of left knee pain. He said that his swelling is about the same as last visit as it came back slowly over the week. Continues to have constant pain. Pain around patella has improved.  Patient has noticed that the swelling in the leg is a little bit worse.  Patient did restart the blood thinner.  Has noticed that there is bruising of the thigh as well.  No pain in the groin area at the moment.  Walking better overall.       Past Medical History:  Diagnosis Date  . A-fib ([place])   . Anxiety   . Atrial fibrillation ([place])   . CHF (congestive heart failure) ([place])   . Claustrophobia    Occasionally when flying   . Colitis   . Diabetes mellitus, type 2 ([place])   . Fracture of one rib, left side, initial encounter for closed fracture 12/27/2016   Occurred 12/21/16 after a fall at home.  Left anterior seventh rib  . Gout   . Hemorrhoids   . Hyperlipidemia   . Hypertension   . LV dysfunction    EF 40-45%  . OSA (obstructive sleep apnea)    CPAP machine   . PVC's (premature ventricular contractions)    Past Surgical History:  Procedure Laterality Date  . CARDIOVASCULAR STRESS TEST  03/02/2010   EF 50%  . US ECHOCARDIOGRAPHY  11/15/2009   EF 40-45%   Social History   Socioeconomic History  . Marital status: Married    Spouse name: Not on file  . Number of children: 3  . Years of education: Not on file  . Highest education level: Not on file  Occupational History  . Occupation: Retired    Fish farm manager: RETIRED    Comment: [employer]   Social Needs  . Financial resource strain: Not on file  . Food insecurity:    Worry: Not on file    Inability: Not on  file  . Transportation needs:    Medical: Not on file    Non-medical: Not on file  Tobacco Use  . Smoking status: Former Smoker    Packs/day: 1.00    Years: 16.00    Pack years: 16.00    Types: Cigarettes    Last attempt to quit: 06/30/1977    Years since quitting: 40.6  . Smokeless tobacco: Never Used  Substance and Sexual Activity  . Alcohol use: No  . Drug use: No  . Sexual activity: Not on file  Lifestyle  . Physical activity:    Days per week: Not on file    Minutes per session: Not on file  . Stress: Not on file  Relationships  . Social connections:    Talks on phone: Not on file    Gets together: Not on file    Attends religious service: Not on file    Active member of club or organization: Not on file    Attends meetings of clubs or organizations: Not on file    Relationship status: Not on file  Other Topics Concern  . Not on file  Social History Narrative   2 caffeine drinks daily  No Known Allergies Family History  Problem Relation Age of Onset  . Hypertension Father   . Heart attack Father        Age 20 (MI)  . Heart failure Father   . Colonic polyp Sister        and Father  . Stroke Mother   . Colon cancer Neg Hx   . Stomach cancer Neg Hx      Current Outpatient Medications (Cardiovascular):  .  amLODipine (NORVASC) 5 MG tablet, TAKE ONE AND ONE-HALF TABLETS DAILY .  atorvastatin (LIPITOR) 10 MG tablet, Take 1 tablet (10 mg total) by mouth daily. .  benazepril (LOTENSIN) 40 MG tablet, Take 1 tablet (40 mg total) by mouth daily. .  furosemide (LASIX) 40 MG tablet, TAKE 1 TABLET DAILY .  metoprolol tartrate (LOPRESSOR) 25 MG tablet, TAKE 2 TABLETS TWICE A DAY (NEED OFFICE VISIT) (SCHEDULE AN APPOINTMENT FOR FUTURE REFILLS)    Current Outpatient Medications (Hematological):  .  dabigatran (PRADAXA) 150 MG CAPS capsule, TAKE 1 CAPSULE EVERY 12 HOURS  Current Outpatient Medications (Other):  .  doxycycline (VIBRA-TABS) 100 MG tablet, Take 1 tablet  (100 mg total) by mouth 2 (two) times daily for 14 days. For 2 weeks .  glucose blood (ONE TOUCH ULTRA TEST) test strip, Check blood sugar daily prn as directed (Patient taking differently: Check blood sugar as directed) .  LUMIGAN 0.01 % SOLN,  .  Multiple Vitamin (MULTIVITAMIN) tablet, Take 1 tablet by mouth daily.   [name] DELICA LANCETS 19J MISC, 1 each by Other route daily. Check blood sugar daily as directed (Patient taking differently: 1 each by Other route daily. Check blood sugar PRN as directed) .  PARoxetine (PAXIL) 20 MG tablet, Take 0.5 tablets (10 mg total) by mouth daily.    Past medical history, social, surgical and family history all reviewed in electronic medical record.  No pertanent information unless stated regarding to the chief complaint.   Review of Systems:  No headache, visual changes, nausea, vomiting, diarrhea, constipation, dizziness, abdominal pain, skin rash, fevers, chills, night sweats, weight loss, swollen lymph nodes, body aches,chest pain, shortness of breath, mood changes.  Positive muscle aches and joint swelling  Objective  Blood pressure (!) 144/102, pulse 71, height 5\' 7"  (1.702 m), weight 230 lb (104.3 kg), SpO2 97 %.   General: No apparent distress alert and oriented x3 mood and affect normal, dressed appropriately.  HEENT: Pupils equal, extraocular movements intact  Respiratory: Patient's speak in full sentences and does not appear short of breath  Cardiovascular: No lower extremity edema, non tender, no erythema  Skin: Warm dry intact with no signs of infection or rash on extremities or on axial skeleton.  Abdomen: Soft nontender  Neuro: Cranial nerves II through XII are intact, neurovascularly intact in all extremities with 2+ DTRs and 2+ pulses.  Lymph: No lymphadenopathy of posterior or anterior cervical chain or axillae bilaterally.  Gait severely antalgic MSK: Left leg is swollen compared to the contralateral side.  Patient is tender to  palpation from the calf on up to the knee.  Patient does have some improvement in range of motion of the knee by 5 degrees in each plane.  Patient does have some resolving bruising noted.  Patient does have new bruising that is noted going up the thigh.  There is a defect noted of the quadricep but patient's extensor mechanism of the knee is intact.  New x-rays do not show any significant bony abnormality  other than mild osteoarthritic changes  Limited musculoskeletal ultrasound was performed and interpreted by Lyndal Pulley  Attempted aspiration was done on the medial aspect of the knee where there seemed to be a significant amount of effusion.  Unfortunately was hematoma.  Aspirated of approximately 10 cc of frank blood and then with pressure removed another serosanguineous fluid.  Band-Aid placed wrap placed.  Postinjection instructions given    Impression and Recommendations:     This case required medical decision making of moderate complexity. The above documentation has been reviewed and is accurate and complete Lyndal Pulley, DO       Note: This dictation was prepared with Dragon dictation along with smaller phrase technology. Any transcriptional errors that result from this process are unintentional.

## 2018-02-22 ENCOUNTER — Ambulatory Visit: Payer: Self-pay

## 2018-02-22 ENCOUNTER — Ambulatory Visit (INDEPENDENT_AMBULATORY_CARE_PROVIDER_SITE_OTHER)
Admission: RE | Admit: 2018-02-22 | Discharge: 2018-02-22 | Disposition: A | Discharge status: Home / Self Care | Payer: Medicare Other | Source: Ambulatory Visit | Attending: Family Medicine | Admitting: Family Medicine

## 2018-02-22 ENCOUNTER — Ambulatory Visit (INDEPENDENT_AMBULATORY_CARE_PROVIDER_SITE_OTHER): Payer: Medicare Other | Admitting: Family Medicine

## 2018-02-22 VITALS — BP 144/102 | HR 71 | Ht 67.0 in | Wt 230.0 lb

## 2018-02-22 DIAGNOSIS — M25562 Pain in left knee: Secondary | ICD-10-CM | POA: Diagnosis not present

## 2018-02-22 DIAGNOSIS — G8929 Other chronic pain: Secondary | ICD-10-CM

## 2018-02-22 DIAGNOSIS — S76112A Strain of left quadriceps muscle, fascia and tendon, initial encounter: Secondary | ICD-10-CM | POA: Diagnosis not present

## 2018-02-22 DIAGNOSIS — M25462 Effusion, left knee: Secondary | ICD-10-CM | POA: Diagnosis not present

## 2018-02-22 DIAGNOSIS — M25062 Hemarthrosis, left knee: Secondary | ICD-10-CM

## 2018-02-22 MED ORDER — DOXYCYCLINE HYCLATE 100 MG PO TABS: 100.0000 mg | ORAL_TABLET | Freq: Two times a day (BID) | ORAL | 0 refills | Status: AC

## 2018-02-22 NOTE — Assessment & Plan Note (Signed)
Patient initially had swelling in the knee and did respond well to the aspiration previously.  Less swelling of the knee and more of the soft tissues in the surrounding area.  My feeling is that there was a significant quadricep tear.  Patient though does have the extensor mechanism intact and does not seem to be in significant amount of pain.  I do not feel that advanced imaging would change medical management at this time.  Patient wants to continue with conservative therapy including elevation, compression, icing.  Patient feels that the pain is well controlled.  Follow-up again 4 to 6 weeks

## 2018-02-22 NOTE — Patient Instructions (Signed)
Good to see you  [facility] is your friend Stay active as you can but when sitting please get leg above heart  Take off one more day from the blood thinner then restart  Refilled the doxycycline  See me again in 10 days

## 2018-02-27 ENCOUNTER — Other Ambulatory Visit: Payer: Self-pay | Admitting: Internal Medicine

## 2018-03-06 ENCOUNTER — Ambulatory Visit (INDEPENDENT_AMBULATORY_CARE_PROVIDER_SITE_OTHER): Payer: Medicare Other | Admitting: Family Medicine

## 2018-03-06 ENCOUNTER — Encounter: Payer: Self-pay | Admitting: Family Medicine

## 2018-03-06 ENCOUNTER — Ambulatory Visit: Payer: Self-pay

## 2018-03-06 VITALS — BP 140/98 | HR 76 | Ht 67.0 in | Wt 222.0 lb

## 2018-03-06 DIAGNOSIS — S8002XD Contusion of left knee, subsequent encounter: Secondary | ICD-10-CM | POA: Diagnosis not present

## 2018-03-06 DIAGNOSIS — M79605 Pain in left leg: Secondary | ICD-10-CM | POA: Diagnosis not present

## 2018-03-06 DIAGNOSIS — M25062 Hemarthrosis, left knee: Secondary | ICD-10-CM

## 2018-03-06 NOTE — Patient Instructions (Addendum)
Good to see you  Ice is your friend I would wrap more  I would like you to see [facility] just to make sure we are good without surgery but nervous this may take many aspirations compared to one quick surgery  I am here if you have questions and if they want to continue to follow it then I am happy to do that for you  Please have your wife write me with questions

## 2018-03-06 NOTE — Assessment & Plan Note (Signed)
Patient initially had more of a hemarthrosis of the knee and did have aspiration.  I feel now that is more of a hematoma of the soft tissue surrounding the area and likely a quadricep tear.  Due to patient's age and current comorbidities I do not think that surgical intervention for repair is necessary but we may need possible evacuation of the hematoma.  We discussed the possibility of repeating aspiration but last time was for somewhat difficult.  I think it would also be very temporary with patient continuing to be on a blood thinner.  Patient has been doing the wrapping occasionally.  Patient will be referred to orthopedic surgery to discuss intervention.  All the patient's questions were answered.  Spent  25 minutes with patient face-to-face and had greater than 50% of counseling including as described above in assessment and plan.

## 2018-03-06 NOTE — Progress Notes (Signed)
[facility] [address] [place], [place] [address] Phone: [phone] Subjective:   [name] No, am serving as a scribe for Dr. Hulan Saas.     CC: Left knee pain follow-up  WNU:[id number]   02/22/2018: Patient initially had swelling in the knee and did respond well to the aspiration previously.  Less swelling of the knee and more of the soft tissues in the surrounding area.  My feeling is that there was a significant quadricep tear.  Patient though does have the extensor mechanism intact and does not seem to be in significant amount of pain.    Update 03/06/5018: Samuel Moyer. is a 78 y.o. male coming in with complaint of left knee pain. Patient states he is feeling the same since last visit. Has been having tenderness in the knee over the past couple of days. Patient has been using ace wrap on occasion. Has tried to elevate his leg as much as possible.  Patient states if anything seems to be worsening a little bit.  Wondering if another aspiration would be beneficial.  Has noticed more bruising on the thigh itself which is new.  Patient is on a blood thinner      Past Medical History:  Diagnosis Date  . A-fib ([place])   . Anxiety   . Atrial fibrillation ([place])   . CHF (congestive heart failure) ([place])   . Claustrophobia    Occasionally when flying   . Colitis   . Diabetes mellitus, type 2 ([place])   . Fracture of one rib, left side, initial encounter for closed fracture 12/27/2016   Occurred 12/21/16 after a fall at home.  Left anterior seventh rib  . Gout   . Hemorrhoids   . Hyperlipidemia   . Hypertension   . LV dysfunction    EF 40-45%  . OSA (obstructive sleep apnea)    CPAP machine   . PVC's (premature ventricular contractions)    Past Surgical History:  Procedure Laterality Date  . CARDIOVASCULAR STRESS TEST  03/02/2010   EF 50%  . US ECHOCARDIOGRAPHY  11/15/2009   EF 40-45%   Social History   Socioeconomic History  . Marital  status: Married    Spouse name: Not on file  . Number of children: 3  . Years of education: Not on file  . Highest education level: Not on file  Occupational History  . Occupation: Retired    Fish farm manager: RETIRED    Comment: [employer]   Social Needs  . Financial resource strain: Not on file  . Food insecurity:    Worry: Not on file    Inability: Not on file  . Transportation needs:    Medical: Not on file    Non-medical: Not on file  Tobacco Use  . Smoking status: Former Smoker    Packs/day: 1.00    Years: 16.00    Pack years: 16.00    Types: Cigarettes    Last attempt to quit: 06/30/1977    Years since quitting: 40.7  . Smokeless tobacco: Never Used  Substance and Sexual Activity  . Alcohol use: No  . Drug use: No  . Sexual activity: Not on file  Lifestyle  . Physical activity:    Days per week: Not on file    Minutes per session: Not on file  . Stress: Not on file  Relationships  . Social connections:    Talks on phone: Not on file    Gets together: Not on  file    Attends religious service: Not on file    Active member of club or organization: Not on file    Attends meetings of clubs or organizations: Not on file    Relationship status: Not on file  Other Topics Concern  . Not on file  Social History Narrative   2 caffeine drinks daily    No Known Allergies Family History  Problem Relation Age of Onset  . Hypertension Father   . Heart attack Father        Age 77 (MI)  . Heart failure Father   . Colonic polyp Sister        and Father  . Stroke Mother   . Colon cancer Neg Hx   . Stomach cancer Neg Hx      Current Outpatient Medications (Cardiovascular):  .  amLODipine (NORVASC) 5 MG tablet, TAKE ONE AND ONE-HALF TABLETS DAILY .  atorvastatin (LIPITOR) 10 MG tablet, TAKE 1 TABLET DAILY .  benazepril (LOTENSIN) 40 MG tablet, Take 1 tablet (40 mg total) by mouth daily. .  furosemide (LASIX) 40 MG tablet, TAKE 1 TABLET DAILY .  metoprolol tartrate  (LOPRESSOR) 25 MG tablet, TAKE 2 TABLETS TWICE A DAY (NEED OFFICE VISIT) (SCHEDULE AN APPOINTMENT FOR FUTURE REFILLS)    Current Outpatient Medications (Hematological):  .  dabigatran (PRADAXA) 150 MG CAPS capsule, TAKE 1 CAPSULE EVERY 12 HOURS  Current Outpatient Medications (Other):  .  doxycycline (VIBRA-TABS) 100 MG tablet, Take 1 tablet (100 mg total) by mouth 2 (two) times daily for 14 days. For 2 weeks .  glucose blood (ONE TOUCH ULTRA TEST) test strip, Check blood sugar daily prn as directed (Patient taking differently: Check blood sugar as directed) .  LUMIGAN 0.01 % SOLN,  .  Multiple Vitamin (MULTIVITAMIN) tablet, Take 1 tablet by mouth daily.   [name] DELICA LANCETS 82U MISC, 1 each by Other route daily. Check blood sugar daily as directed (Patient taking differently: 1 each by Other route daily. Check blood sugar PRN as directed) .  PARoxetine (PAXIL) 20 MG tablet, Take 0.5 tablets (10 mg total) by mouth daily.    Past medical history, social, surgical and family history all reviewed in electronic medical record.  No pertanent information unless stated regarding to the chief complaint.   Review of Systems:  No headache, visual changes, nausea, vomiting, diarrhea, constipation, dizziness, abdominal pain, skin rash, fevers, chills, night sweats, weight loss, swollen lymph nodes, body aches, joint swelling, , chest pain, shortness of breath, mood changes.  Positive muscle aches  Objective  Blood pressure (!) 140/98, pulse 76, height 5\' 7"  (1.702 m), weight 222 lb (100.7 kg), SpO2 98 %.   General: No apparent distress alert and oriented x3 mood and affect normal, dressed appropriately.  HEENT: Pupils equal, extraocular movements intact  Respiratory: Patient's speak in full sentences and does not appear short of breath   Abdomen: Soft nontender  Neuro: Cranial nerves II through XII are intact, neurovascularly intact in all extremities with 2+ DTRs and 2+ pulses.  Lymph: No  lymphadenopathy of posterior or anterior cervical chain or axillae bilaterally.  Gait severely antalgic walking with the aid of a cane MSK: Left knee exam is significantly swollen.  Patient has new swelling around the quadricep on the lateral aspect.  Was having more on the medial.  Patient is knee is more swollen than previous exam.  Lacks last 15 degrees of extension and only 95 degrees of flexion.  Patient has  fairly good stability but difficult to assess secondary to patient's pain..      Impression and Recommendations:     This case required medical decision making of moderate complexity. The above documentation has been reviewed and is accurate and complete Lyndal Pulley, DO       Note: This dictation was prepared with Dragon dictation along with smaller phrase technology. Any transcriptional errors that result from this process are unintentional.

## 2018-03-12 DIAGNOSIS — S8002XA Contusion of left knee, initial encounter: Secondary | ICD-10-CM | POA: Diagnosis not present

## 2018-03-12 DIAGNOSIS — M25562 Pain in left knee: Secondary | ICD-10-CM | POA: Diagnosis not present

## 2018-03-15 DIAGNOSIS — M25562 Pain in left knee: Secondary | ICD-10-CM | POA: Diagnosis not present

## 2018-03-19 DIAGNOSIS — Z9889 Other specified postprocedural states: Secondary | ICD-10-CM | POA: Diagnosis not present

## 2018-03-19 DIAGNOSIS — M25562 Pain in left knee: Secondary | ICD-10-CM | POA: Diagnosis not present

## 2018-03-20 ENCOUNTER — Other Ambulatory Visit: Payer: Self-pay | Admitting: Internal Medicine

## 2018-03-26 ENCOUNTER — Telehealth: Payer: Self-pay | Admitting: *Deleted

## 2018-03-26 NOTE — Progress Notes (Addendum)
Subjective:   Samuel Herbst. is a 78 y.o. male who presents for Medicare Annual/Subsequent preventive examination.  Review of Systems:  No ROS.  Medicare Wellness Visit. Additional risk factors are reflected in the social history.   Sleep patterns: feels rested on waking, gets up 1-2 times nightly to void and sleeps 7 hours nightly.    Home Safety/Smoke Alarms: Feels safe in home. Smoke alarms in place.  Living environment; residence and Firearm Safety: 1-story house/ trailer. Lives with wife, no needs for DME, good support system Seat Belt Safety/Bike Helmet: Wears seat belt.   PSA-  Lab Results  Component Value Date   PSA 0.63 09/12/2007   PSA 0.74 08/27/2006       Objective:    Vitals: BP (!) 144/72   Pulse 62   Resp 16   Ht 5\' 7"  (1.702 m)   Wt 221 lb (100.2 kg)   SpO2 96%   BMI 34.61 kg/m   Body mass index is 34.61 kg/m.  Advanced Directives 03/27/2018 12/23/2016 06/01/2016 12/28/2014  Does Patient Have a Medical Advance Directive? Yes No Yes Yes  Type of Paramedic of [place];Living will - [place];Living will Living will;Healthcare Power of [place] in Chart? No - copy requested - No - copy requested -    Tobacco Social History   Tobacco Use  Smoking Status Former Smoker  . Packs/day: 1.00  . Years: 16.00  . Pack years: 16.00  . Types: Cigarettes  . Last attempt to quit: 06/30/1977  . Years since quitting: 40.7  Smokeless Tobacco Never Used     Counseling given: Not Answered  Past Medical History:  Diagnosis Date  . A-fib ([place])   . Anxiety   . Atrial fibrillation ([place])   . CHF (congestive heart failure) ([place])   . Claustrophobia    Occasionally when flying   . Colitis   . Diabetes mellitus, type 2 ([place])   . Fracture of one rib, left side, initial encounter for closed fracture 12/27/2016   Occurred 12/21/16 after a fall at home.  Left anterior seventh rib  . Gout     . Hemorrhoids   . Hyperlipidemia   . Hypertension   . LV dysfunction    EF 40-45%  . OSA (obstructive sleep apnea)    CPAP machine   . PVC's (premature ventricular contractions)    Past Surgical History:  Procedure Laterality Date  . CARDIOVASCULAR STRESS TEST  03/02/2010   EF 50%  . US ECHOCARDIOGRAPHY  11/15/2009   EF 40-45%   Family History  Problem Relation Age of Onset  . Hypertension Father   . Heart attack Father        Age 44 (MI)  . Heart failure Father   . Colonic polyp Sister        and Father  . Stroke Mother   . Colon cancer Neg Hx   . Stomach cancer Neg Hx    Social History   Socioeconomic History  . Marital status: Married    Spouse name: Not on file  . Number of children: 3  . Years of education: Not on file  . Highest education level: Not on file  Occupational History  . Occupation: Retired    Fish farm manager: RETIRED    Comment: [employer]   Social Needs  . Financial resource strain: Not hard at all  . Food insecurity:    Worry: Never true  Inability: Never true  . Transportation needs:    Medical: No    Non-medical: No  Tobacco Use  . Smoking status: Former Smoker    Packs/day: 1.00    Years: 16.00    Pack years: 16.00    Types: Cigarettes    Last attempt to quit: 06/30/1977    Years since quitting: 40.7  . Smokeless tobacco: Never Used  Substance and Sexual Activity  . Alcohol use: No  . Drug use: No  . Sexual activity: Not on file  Lifestyle  . Physical activity:    Days per week: 0 days    Minutes per session: 0 min  . Stress: Not at all  Relationships  . Social connections:    Talks on phone: More than three times a week    Gets together: More than three times a week    Attends religious service: More than 4 times per year    Active member of club or organization: Yes    Attends meetings of clubs or organizations: More than 4 times per year    Relationship status: Married  Other Topics Concern  . Not on file  Social  History Narrative   2 caffeine drinks daily     Outpatient Encounter Medications as of 03/27/2018  Medication Sig  . amLODipine (NORVASC) 5 MG tablet TAKE ONE AND ONE-HALF TABLETS DAILY  . atorvastatin (LIPITOR) 10 MG tablet TAKE 1 TABLET DAILY  . benazepril (LOTENSIN) 40 MG tablet Take 1 tablet (40 mg total) by mouth daily.  . cephALEXin (KEFLEX) 500 MG capsule   . dabigatran (PRADAXA) 150 MG CAPS capsule TAKE 1 CAPSULE EVERY 12 HOURS  . furosemide (LASIX) 40 MG tablet TAKE 1 TABLET DAILY  . glucose blood (ONE TOUCH ULTRA TEST) test strip Check blood sugar daily prn as directed (Patient taking differently: Check blood sugar as directed)  . LUMIGAN 0.01 % SOLN   . metoprolol tartrate (LOPRESSOR) 25 MG tablet TAKE 2 TABLETS TWICE A DAY (NEED OFFICE VISIT) (SCHEDULE AN APPOINTMENT FOR FUTURE REFILLS)  . Multiple Vitamin (MULTIVITAMIN) tablet Take 1 tablet by mouth daily.    [name] DELICA LANCETS 32T MISC 1 each by Other route daily. Check blood sugar daily as directed (Patient taking differently: 1 each by Other route daily. Check blood sugar PRN as directed)  . PARoxetine (PAXIL) 20 MG tablet TAKE ONE-HALF (1/2) TABLET DAILY  . [DISCONTINUED] benazepril (LOTENSIN) 40 MG tablet Take 1 tablet (40 mg total) by mouth daily.   No facility-administered encounter medications on file as of 03/27/2018.     Activities of Daily Living In your present state of health, do you have any difficulty performing the following activities: 03/27/2018  Hearing? N  Vision? N  Difficulty concentrating or making decisions? N  Walking or climbing stairs? N  Dressing or bathing? N  Doing errands, shopping? N  Preparing Food and eating ? N  Using the Toilet? N  In the past six months, have you accidently leaked urine? N  Do you have problems with loss of bowel control? N  Managing your Medications? N  Managing your Finances? N  Housekeeping or managing your Housekeeping? N  Some recent data might be hidden     Patient Care Team: Binnie Rail, MD as PCP - General (Internal Medicine) [place], Peter M, MD as Referring Physician (Cardiology) Newt Lukes, AUD (Audiology) Lyndal Pulley, DO as Consulting Physician (Family Medicine)   Assessment:   This is a routine wellness examination  for Samuel Moyer. Physical assessment deferred to PCP.  Exercise Activities and Dietary recommendations   Diet (meal preparation, eat out, water intake, caffeinated beverages, dairy products, fruits and vegetables): in general, a "healthy" diet  , well balanced   Reviewed heart healthy diet. Encouraged patient to increase daily water and healthy fluid intake.  Goals    . lose 10 pounds     Start walk in the neighborhood and go to the [facility], cut down on starches, use the steps [place] the ball[place].     . Patient Stated     Travel more within the [place]. Try to eat as healthy as possible. Stay active by working at the [employer].       Fall Risk Fall Risk  03/27/2018 03/27/2018 12/04/2016 06/01/2016 04/06/2016  Falls in the past year? 1 0 No No No  Number falls in past yr: 0 - - - -  Injury with Fall? 1 - - - -  Follow up Falls prevention discussed;Education provided - - - -    Depression Screen PHQ 2/9 Scores 03/27/2018 03/27/2018 12/04/2016 06/01/2016  PHQ - 2 Score 0 0 0 0  PHQ- 9 Score - - - 0    Cognitive Function       Ad8 score reviewed for issues:  Issues making decisions: no  Less interest in hobbies / activities: no  Repeats questions, stories (family complaining): no  Trouble using ordinary gadgets (microwave, computer, phone):no  Forgets the month or year: no  Mismanaging finances: no  Remembering appts: no  Daily problems with thinking and/or memory: no Ad8 score is= 0  Immunization History  Administered Date(s) Administered  . Influenza Split 09/30/2013  . Influenza Whole 10/21/2007, 10/31/2010, 10/31/2011  . Influenza, High Dose Seasonal PF 11/12/2013, 11/24/2014, 11/18/2015,  11/23/2016  . Influenza-Unspecified 09/30/2012, 11/24/2014, 11/18/2015, 11/08/2017  . Pneumococcal Conjugate-13 11/12/2013, 03/05/2015  . Pneumococcal Polysaccharide-23 11/23/2016  . Pneumococcal-Unspecified 01/21/2010  . Tdap 12/03/2015, 12/23/2016  . Zoster 03/25/2007   Screening Tests Health Maintenance  Topic Date Due  . TETANUS/TDAP  12/24/2026  . INFLUENZA VACCINE  Completed  . PNA vac Low Risk Adult  Completed      Plan:    Reviewed health maintenance screenings with patient today and relevant education, vaccines, and/or referrals were provided.   Continue doing brain stimulating activities (puzzles, reading, adult coloring books, staying active) to keep memory sharp.   Continue to eat heart healthy diet (full of fruits, vegetables, whole grains, lean protein, water--limit salt, fat, and sugar intake) and increase physical activity as tolerated.  I have personally reviewed and noted the following in the patient's chart:   . Medical and social history . Use of alcohol, tobacco or illicit drugs  . Current medications and supplements . Functional ability and status . Nutritional status . Physical activity . Advanced directives . List of other physicians . Vitals . Screenings to include cognitive, depression, and falls . Referrals and appointments  In addition, I have reviewed and discussed with patient certain preventive protocols, quality metrics, and best practice recommendations. A written personalized care plan for preventive services as well as general preventive health recommendations were provided to patient.     Michiel Cowboy, RN  03/27/2018    Medical screening examination/treatment/procedure(s) were performed by non-physician practitioner and as supervising physician I was immediately available for consultation/collaboration. I agree with above. Binnie Rail, MD

## 2018-03-26 NOTE — Telephone Encounter (Signed)
Called patient to schedule AWV, patient scheduled on 03/27/18.

## 2018-03-26 NOTE — Progress Notes (Signed)
Subjective:    Patient ID: Samuel Moyer., male    DOB: 17-Jun-1940, 77 y.o.   MRN: [id number]  HPI The patient is here for follow up.  Afib, Hypertension: He is taking his medication daily. He is compliant with a low sodium diet.  He denies chest pain, palpitations, edema, shortness of breath and regular headaches. He is not exercising regularly.  He does not monitor his blood pressure at home.    Prediabetes:  He is compliant with a low sugar/carbohydrate diet.  He is not exercising regularly.  Hyperlipidemia: He is taking his medication daily. He is compliant with a low fat/cholesterol diet. He is not exercising regularly. He denies myalgias.   Anxiety: He is taking his medication daily as prescribed. He denies any side effects from the medication. He feels his anxiety is well controlled and he is happy with his current dose of medication.    Medications and allergies reviewed with patient and updated if appropriate.  Patient Active Problem List   Diagnosis Date Noted  . Strain of left quadriceps 02/22/2018  . 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 Acute bronchitis 01/15/2018  . Cellulitis of right knee 01/04/2018  . Acute pain of right knee 12/25/2017  . Fall 12/27/2016  . Gout attack 06/03/2015  . Prediabetes 03/05/2015  . Obesity 12/28/2014  . Venous (peripheral) insufficiency 02/16/2014  . OSA (obstructive sleep apnea) 05/24/2010  . ATRIAL FIBRILLATION  01/26/2010  . Chronic systolic heart failure ([place]) 01/26/2010  . Asthma 01/14/2010  . Hypercholesterolemia 10/21/2007  . Anxiety 10/21/2007  . Essential hypertension 02/26/2006  . COLONIC POLYPS, HX OF 02/26/2006    Current Outpatient Medications on File Prior to Visit  Medication Sig Dispense Refill  . amLODipine (NORVASC) 5 MG tablet TAKE ONE AND ONE-HALF TABLETS DAILY 135 tablet 0  . atorvastatin (LIPITOR) 10 MG tablet TAKE 1 TABLET DAILY 90 tablet 1  . dabigatran (PRADAXA) 150 MG CAPS capsule TAKE 1 CAPSULE  EVERY 12 HOURS 180 capsule 3  . furosemide (LASIX) 40 MG tablet TAKE 1 TABLET DAILY 90 tablet 0  . glucose blood (ONE TOUCH ULTRA TEST) test strip Check blood sugar daily prn as directed (Patient taking differently: Check blood sugar as directed) 100 each 0  . LUMIGAN 0.01 % SOLN     . metoprolol tartrate (LOPRESSOR) 25 MG tablet TAKE 2 TABLETS TWICE A DAY (NEED OFFICE VISIT) (SCHEDULE AN APPOINTMENT FOR FUTURE REFILLS) 360 tablet 2  . Multiple Vitamin (MULTIVITAMIN) tablet Take 1 tablet by mouth daily.      [name] DELICA LANCETS 67Y MISC 1 each by Other route daily. Check blood sugar daily as directed (Patient taking differently: 1 each by Other route daily. Check blood sugar PRN as directed) 100 each 0  . PARoxetine (PAXIL) 20 MG tablet TAKE ONE-HALF (1/2) TABLET DAILY 45 tablet 1  . cephALEXin (KEFLEX) 500 MG capsule      No current facility-administered medications on file prior to visit.     Past Medical History:  Diagnosis Date  . A-fib ([place])   . Anxiety   . Atrial fibrillation ([place])   . CHF (congestive heart failure) ([place])   . Claustrophobia    Occasionally when flying   . Colitis   . Diabetes mellitus, type 2 ([place])   . Fracture of one rib, left side, initial encounter for closed fracture 12/27/2016   Occurred 12/21/16 after a fall at home.  Left anterior seventh rib  . Gout   .  Hemorrhoids   . Hyperlipidemia   . Hypertension   . LV dysfunction    EF 40-45%  . OSA (obstructive sleep apnea)    CPAP machine   . PVC's (premature ventricular contractions)     Past Surgical History:  Procedure Laterality Date  . CARDIOVASCULAR STRESS TEST  03/02/2010   EF 50%  . US ECHOCARDIOGRAPHY  11/15/2009   EF 40-45%    Social History   Socioeconomic History  . Marital status: Married    Spouse name: Not on file  . Number of children: 3  . Years of education: Not on file  . Highest education level: Not on file  Occupational History  . Occupation: Retired    Fish farm manager:  RETIRED    Comment: [employer]   Social Needs  . Financial resource strain: Not on file  . Food insecurity:    Worry: Not on file    Inability: Not on file  . Transportation needs:    Medical: Not on file    Non-medical: Not on file  Tobacco Use  . Smoking status: Former Smoker    Packs/day: 1.00    Years: 16.00    Pack years: 16.00    Types: Cigarettes    Last attempt to quit: 06/30/1977    Years since quitting: 40.7  . Smokeless tobacco: Never Used  Substance and Sexual Activity  . Alcohol use: No  . Drug use: No  . Sexual activity: Not on file  Lifestyle  . Physical activity:    Days per week: Not on file    Minutes per session: Not on file  . Stress: Not on file  Relationships  . Social connections:    Talks on phone: Not on file    Gets together: Not on file    Attends religious service: Not on file    Active member of club or organization: Not on file    Attends meetings of clubs or organizations: Not on file    Relationship status: Not on file  Other Topics Concern  . Not on file  Social History Narrative   2 caffeine drinks daily     Family History  Problem Relation Age of Onset  . Hypertension Father   . Heart attack Father        Age 45 (MI)  . Heart failure Father   . Colonic polyp Sister        and Father  . Stroke Mother   . Colon cancer Neg Hx   . Stomach cancer Neg Hx     Review of Systems  Constitutional: Negative for chills and fever.  HENT: Positive for rhinorrhea.   Respiratory: Negative for cough, shortness of breath and wheezing.   Cardiovascular: Negative for chest pain, palpitations and leg swelling.  Musculoskeletal: Positive for joint swelling (left knee).  Neurological: Negative for dizziness, light-headedness and headaches.       Objective:   Vitals:   03/27/18 0939  BP: (!) 144/72  Pulse: 62  Resp: 16  Temp: 98.6 F (37 C)  SpO2: 96%   BP Readings from Last 3 Encounters:  03/27/18 (!) 144/72  03/06/18 (!)  140/98  02/22/18 (!) 144/102   Wt Readings from Last 3 Encounters:  03/27/18 221 lb 12.8 oz (100.6 kg)  03/06/18 222 lb (100.7 kg)  02/22/18 230 lb (104.3 kg)   Body mass index is 34.74 kg/m.   Physical Exam    Constitutional: Appears well-developed and well-nourished. No distress.  HENT:  Head:  Normocephalic and atraumatic.  Neck: Neck supple. No tracheal deviation present. No thyromegaly present.  No cervical lymphadenopathy Cardiovascular: Normal rate, regular rhythm and normal heart sounds.   No murmur heard. No carotid bruit .  LLE > RLE edema Pulmonary/Chest: Effort normal and breath sounds normal. No respiratory distress. No has no wheezes. No rales.  Skin: Skin is warm and dry. Not diaphoretic.  Ecchymosis left lower extremity Psychiatric: Normal mood and affect. Behavior is normal.      Assessment & Plan:    See Problem List for Assessment and Plan of chronic medical problems.

## 2018-03-27 ENCOUNTER — Ambulatory Visit (INDEPENDENT_AMBULATORY_CARE_PROVIDER_SITE_OTHER): Payer: Medicare Other | Admitting: *Deleted

## 2018-03-27 ENCOUNTER — Ambulatory Visit (INDEPENDENT_AMBULATORY_CARE_PROVIDER_SITE_OTHER): Payer: Medicare Other | Admitting: Internal Medicine

## 2018-03-27 ENCOUNTER — Other Ambulatory Visit (INDEPENDENT_AMBULATORY_CARE_PROVIDER_SITE_OTHER): Payer: Medicare Other

## 2018-03-27 ENCOUNTER — Encounter: Payer: Self-pay | Admitting: Internal Medicine

## 2018-03-27 VITALS — BP 144/72 | HR 62 | Resp 16 | Ht 67.0 in | Wt 221.0 lb

## 2018-03-27 VITALS — BP 144/72 | HR 62 | Temp 98.6°F | Resp 16 | Ht 67.0 in | Wt 221.8 lb

## 2018-03-27 DIAGNOSIS — R7303 Prediabetes: Secondary | ICD-10-CM

## 2018-03-27 DIAGNOSIS — I4891 Unspecified atrial fibrillation: Secondary | ICD-10-CM | POA: Diagnosis not present

## 2018-03-27 DIAGNOSIS — F419 Anxiety disorder, unspecified: Secondary | ICD-10-CM

## 2018-03-27 DIAGNOSIS — I10 Essential (primary) hypertension: Secondary | ICD-10-CM | POA: Diagnosis not present

## 2018-03-27 DIAGNOSIS — E7800 Pure hypercholesterolemia, unspecified: Secondary | ICD-10-CM | POA: Diagnosis not present

## 2018-03-27 DIAGNOSIS — Z0000 Encounter for general adult medical examination without abnormal findings: Secondary | ICD-10-CM | POA: Diagnosis not present

## 2018-03-27 LAB — CBC WITH DIFFERENTIAL/PLATELET
BASOS PCT: 1.1 % (ref 0.0–3.0)
Basophils Absolute: 0.1 10*3/uL (ref 0.0–0.1)
Eosinophils Absolute: 0.1 10*3/uL (ref 0.0–0.7)
Eosinophils Relative: 1.6 % (ref 0.0–5.0)
HCT: 39 % (ref 39.0–52.0)
Hemoglobin: 13 g/dL (ref 13.0–17.0)
Lymphocytes Relative: 25.4 % (ref 12.0–46.0)
Lymphs Abs: 1.9 10*3/uL (ref 0.7–4.0)
MCHC: 33.2 g/dL (ref 30.0–36.0)
MCV: 98.2 fl (ref 78.0–100.0)
Monocytes Absolute: 0.8 10*3/uL (ref 0.1–1.0)
Monocytes Relative: 11.4 % (ref 3.0–12.0)
Neutro Abs: 4.5 10*3/uL (ref 1.4–7.7)
Neutrophils Relative %: 60.5 % (ref 43.0–77.0)
Platelets: 232 10*3/uL (ref 150.0–400.0)
RBC: 3.98 Mil/uL — ABNORMAL LOW (ref 4.22–5.81)
RDW: 17.7 % — ABNORMAL HIGH (ref 11.5–15.5)
WBC: 7.4 10*3/uL (ref 4.0–10.5)

## 2018-03-27 LAB — COMPREHENSIVE METABOLIC PANEL
ALT: 12 U/L (ref 0–53)
AST: 16 U/L (ref 0–37)
Albumin: 4.1 g/dL (ref 3.5–5.2)
Alkaline Phosphatase: 62 U/L (ref 39–117)
BUN: 18 mg/dL (ref 6–23)
CO2: 29 mEq/L (ref 19–32)
Calcium: 9.2 mg/dL (ref 8.4–10.5)
Chloride: 103 mEq/L (ref 96–112)
Creatinine, Ser: 1.11 mg/dL (ref 0.40–1.50)
GFR: 63.97 mL/min (ref >60.00)
GLUCOSE: 105 mg/dL — AB (ref 70–99)
Potassium: 4.2 mEq/L (ref 3.5–5.1)
Sodium: 140 mEq/L (ref 135–145)
Total Bilirubin: 0.5 mg/dL (ref 0.2–1.2)
Total Protein: 7.1 g/dL (ref 6.0–8.3)

## 2018-03-27 LAB — LIPID PANEL
Cholesterol: 154 mg/dL (ref 0–200)
HDL: 64 mg/dL (ref >39.00)
LDL Cholesterol: 80 mg/dL (ref 0–99)
NONHDL: 89.61
Total CHOL/HDL Ratio: 2
Triglycerides: 50 mg/dL (ref 0.0–149.0)
VLDL: 10 mg/dL (ref 0.0–40.0)

## 2018-03-27 LAB — HEMOGLOBIN A1C: HEMOGLOBIN A1C: 5.3 % (ref 4.6–6.5)

## 2018-03-27 MED ORDER — BENAZEPRIL HCL 40 MG PO TABS: 40.0000 mg | ORAL_TABLET | Freq: Every day | ORAL | 1 refills | Status: DC

## 2018-03-27 NOTE — Assessment & Plan Note (Signed)
Check lipid panel  Continue daily statin Regular exercise and healthy diet encouraged  

## 2018-03-27 NOTE — Patient Instructions (Addendum)

## 2018-03-27 NOTE — Assessment & Plan Note (Signed)
Following with cardiology Rate controlled On Pradaxa, metoprolol CBC, CMP

## 2018-03-27 NOTE — Patient Instructions (Addendum)
Continue doing brain stimulating activities (puzzles, reading, adult coloring books, staying active) to keep memory sharp.   Continue to eat heart healthy diet (full of fruits, vegetables, whole grains, lean protein, water--limit salt, fat, and sugar intake) and increase physical activity as tolerated.   Mr. Borner , Thank you for taking time to come for your Medicare Wellness Visit. I appreciate your ongoing commitment to your health goals. Please review the following plan we discussed and let me know if I can assist you in the future.   These are the goals we discussed: Goals    . lose 10 pounds     Start walk in the neighborhood and go to the [facility], cut down on starches, [place].     . Patient Stated     Travel more within the [place]. Try to eat as healthy as possible. Stay active by working at the [employer].       This is a list of the screening recommended for you and due dates:  Health Maintenance  Topic Date Due  . Tetanus Vaccine  12/24/2026  . Flu Shot  Completed  . Pneumonia vaccines  Completed     Preventive Care 79 Years and Older, Male Preventive care refers to lifestyle choices and visits with your health care provider that can promote health and wellness. What does preventive care include?   A yearly physical exam. This is also called an annual well check.  Dental exams once or twice a year.  Routine eye exams. Ask your health care provider how often you should have your eyes checked.  Personal lifestyle choices, including: ? Daily care of your teeth and gums. ? Regular physical activity. ? Eating a healthy diet. ? Avoiding tobacco and drug use. ? Limiting alcohol use. ? Practicing safe sex. ? Taking low doses of aspirin every day. ? Taking vitamin and mineral supplements as recommended by your health care provider. What happens during an annual well check? The services and screenings done by your health care provider during  your annual well check will depend on your age, overall health, lifestyle risk factors, and family history of disease. Counseling Your health care provider may ask you questions about your:  Alcohol use.  Tobacco use.  Drug use.  Emotional well-being.  Home and relationship well-being.  Sexual activity.  Eating habits.  History of falls.  Memory and ability to understand (cognition).  Work and work Statistician. Screening You may have the following tests or measurements:  Height, weight, and BMI.  Blood pressure.  Lipid and cholesterol levels. These may be checked every 5 years, or more frequently if you are over 59 years old.  Skin check.  Lung cancer screening. You may have this screening every year starting at age 60 if you have a 30-pack-year history of smoking and currently smoke or have quit within the past 15 years.  Colorectal cancer screening. All adults should have this screening starting at age 86 and continuing until age 19. You will have tests every 1-10 years, depending on your results and the type of screening test. People at increased risk should start screening at an earlier age. Screening tests may include: ? Guaiac-based fecal occult blood testing. ? Fecal immunochemical test (FIT). ? Stool DNA test. ? Virtual colonoscopy. ? Sigmoidoscopy. During this test, a flexible tube with a tiny camera (sigmoidoscope) is used to examine your rectum and lower colon. The sigmoidoscope is inserted through your anus into your  rectum and lower colon. ? Colonoscopy. During this test, a long, thin, flexible tube with a tiny camera (colonoscope) is used to examine your entire colon and rectum.  Prostate cancer screening. Recommendations will vary depending on your family history and other risks.  Hepatitis C blood test.  Hepatitis B blood test.  Sexually transmitted disease (STD) testing.  Diabetes screening. This is done by checking your blood sugar (glucose) after  you have not eaten for a while (fasting). You may have this done every 1-3 years.  Abdominal aortic aneurysm (AAA) screening. You may need this if you are a current or former smoker.  Osteoporosis. You may be screened starting at age 36 if you are at high risk. Talk with your health care provider about your test results, treatment options, and if necessary, the need for more tests. Vaccines Your health care provider may recommend certain vaccines, such as:  Influenza vaccine. This is recommended every year.  Tetanus, diphtheria, and acellular pertussis (Tdap, Td) vaccine. You may need a Td booster every 10 years.  Varicella vaccine. You may need this if you have not been vaccinated.  Zoster vaccine. You may need this after age 35.  Measles, mumps, and rubella (MMR) vaccine. You may need at least one dose of MMR if you were born in 1957 or later. You may also need a second dose.  Pneumococcal 13-valent conjugate (PCV13) vaccine. One dose is recommended after age 61.  Pneumococcal polysaccharide (PPSV23) vaccine. One dose is recommended after age 36.  Meningococcal vaccine. You may need this if you have certain conditions.  Hepatitis A vaccine. You may need this if you have certain conditions or if you travel or work in places where you may be exposed to hepatitis A.  Hepatitis B vaccine. You may need this if you have certain conditions or if you travel or work in places where you may be exposed to hepatitis B.  Haemophilus influenzae type b (Hib) vaccine. You may need this if you have certain risk factors. Talk to your health care provider about which screenings and vaccines you need and how often you need them. This information is not intended to replace advice given to you by your health care provider. Make sure you discuss any questions you have with your health care provider. Document Released: 02/12/2015 Document Revised: 03/08/2017 Document Reviewed: 11/17/2014 Elsevier  Interactive Patient Education  [employer].      It is important to avoid accidents which may result in broken bones.  Here are a few ideas on how to make your home safer so you will be less likely to trip or fall.  1. Use nonskid mats or non slip strips in your shower or tub, on your bathroom floor and around sinks.  If you know that you have spilled water, wipe it up! 2. In the bathroom, it is important to have properly installed grab bars on the walls or on the edge of the tub.  Towel racks are NOT strong enough for you to hold onto or to pull on for support. 3. Stairs and hallways should have enough light.  Add lamps or night lights if you need ore light. 4. It is good to have handrails on both sides of the stairs if possible.  Always fix broken handrails right away. 5. It is important to see the edges of steps.  Paint the edges of outdoor steps white so you can see them better.  Put colored tape on the edge of inside  steps. 6. Throw-rugs are dangerous because they can slide.  Removing the rugs is the best idea, but if they must stay, add adhesive carpet tape to prevent slipping. 7. Do not keep things on stairs or in the halls.  Remove small furniture that blocks the halls as it may cause you to trip.  Keep telephone and electrical cords out of the way where you walk. 8. Always were sturdy, rubber-soled shoes for good support.  Never wear just socks, especially on the stairs.  Socks may cause you to slip or fall.  Do not wear full-length housecoats as you can easily trip on the bottom.  9. Place the things you use the most on the shelves that are the easiest to reach.  If you use a stepstool, make sure it is in good condition.  If you feel unsteady, DO NOT climb, ask for help. 10. If a health professional advises you to use a cane or walker, do not be ashamed.  These items can keep you from falling and breaking your bones.

## 2018-03-27 NOTE — Assessment & Plan Note (Signed)
Controlled, stable Continue current dose of medication  

## 2018-03-27 NOTE — Assessment & Plan Note (Signed)
BP well controlled Current regimen effective and well tolerated Continue current medications at current doses cmp  

## 2018-03-27 NOTE — Assessment & Plan Note (Signed)
Check a1c Low sugar / carb diet Stressed regular exercise   

## 2018-03-28 ENCOUNTER — Encounter: Payer: Self-pay | Admitting: Internal Medicine

## 2018-03-28 DIAGNOSIS — Z9889 Other specified postprocedural states: Secondary | ICD-10-CM | POA: Diagnosis not present

## 2018-04-11 DIAGNOSIS — Z9889 Other specified postprocedural states: Secondary | ICD-10-CM | POA: Diagnosis not present

## 2018-05-12 ENCOUNTER — Other Ambulatory Visit: Payer: Self-pay | Admitting: Cardiology

## 2018-05-19 ENCOUNTER — Other Ambulatory Visit: Payer: Self-pay | Admitting: Cardiology

## 2018-05-20 NOTE — Telephone Encounter (Signed)
Furosemide  40 mg refilled 

## 2018-07-08 ENCOUNTER — Telehealth: Payer: Self-pay | Admitting: Cardiology

## 2018-07-08 NOTE — Telephone Encounter (Signed)
call home phone/ consent/ my chart active/ pre reg completed  °

## 2018-07-09 NOTE — Progress Notes (Signed)
Virtual Visit via Telephone Note   This visit type was conducted due to national recommendations for restrictions regarding the COVID-19 Pandemic (e.g. social distancing) in an effort to limit this patient's exposure and mitigate transmission in our community.  Due to his co-morbid illnesses, this patient is at least at moderate risk for complications without adequate follow up.  This format is felt to be most appropriate for this patient at this time.  The patient did not have access to video technology/had technical difficulties with video requiring transitioning to audio format only (telephone).  All issues noted in this document were discussed and addressed.  No physical exam could be performed with this format.  Please refer to the patient's chart for his  consent to telehealth for [facility].   Date:  07/12/2018   ID:  Samuel Dalton., DOB 22-Jan-1941, MRN [id number]  Patient Location: Home Provider Location: Home  PCP:  Binnie Rail, MD  Cardiologist:  Ramesh Moan [place] MD Electrophysiologist:  None   Evaluation Performed:  Follow-Up Visit  Chief Complaint:  Follow up AFib and CHF  History of Present Illness:    Samuel Moyer. is a 78 y.o. male with a history of permanent atrial fibrillation. He also is a history of congestive heart failure with ejection fraction of 40-45%. Normal myoview in 2011 with EF of 50% at that time. He does have OSA on CPAP and is followed by pulmonary.   He is doing well from a cardiac standpoint. He denies any chest pain or SOB. No palpitations or dizziness. Did have a mechanical fall in January with a hematoma on his knee that was treated by ortho. He has some chronic discoloration/hyperpigmentation of his left leg. Some runny nose in the am. Using his CPAP.   The patient does not have symptoms concerning for COVID-19 infection (fever, chills, cough, or new shortness of breath).    Past Medical History:  Diagnosis Date  . A-fib ([place])   . Anxiety    . Atrial fibrillation ([place])   . CHF (congestive heart failure) ([place])   . Claustrophobia    Occasionally when flying   . Colitis   . Diabetes mellitus, type 2 ([place])   . Fracture of one rib, left side, initial encounter for closed fracture 12/27/2016   Occurred 12/21/16 after a fall at home.  Left anterior seventh rib  . Gout   . Hemorrhoids   . Hyperlipidemia   . Hypertension   . LV dysfunction    EF 40-45%  . OSA (obstructive sleep apnea)    CPAP machine   . PVC's (premature ventricular contractions)    Past Surgical History:  Procedure Laterality Date  . CARDIOVASCULAR STRESS TEST  03/02/2010   EF 50%  . US ECHOCARDIOGRAPHY  11/15/2009   EF 40-45%     Current Meds  Medication Sig  . amLODipine (NORVASC) 5 MG tablet TAKE ONE AND ONE-HALF TABLETS DAILY (NEED TO SCHEDULE APPOINTMENT)  . atorvastatin (LIPITOR) 10 MG tablet TAKE 1 TABLET DAILY  . benazepril (LOTENSIN) 40 MG tablet Take 1 tablet (40 mg total) by mouth daily.  . cephALEXin (KEFLEX) 500 MG capsule   . dabigatran (PRADAXA) 150 MG CAPS capsule TAKE 1 CAPSULE EVERY 12 HOURS  . furosemide (LASIX) 40 MG tablet Take 1 tablet (40 mg total) by mouth daily.  Marland Kitchen glucose blood (ONE TOUCH ULTRA TEST) test strip Check blood sugar daily prn as directed (Patient taking differently: Check blood sugar as directed)  . LUMIGAN  0.01 % SOLN Place 1 drop into both eyes at bedtime.   . metoprolol tartrate (LOPRESSOR) 25 MG tablet TAKE 2 TABLETS TWICE A DAY (NEED OFFICE VISIT) (SCHEDULE AN APPOINTMENT FOR FUTURE REFILLS)  . Multiple Vitamin (MULTIVITAMIN) tablet Take 1 tablet by mouth daily.    [name] DELICA LANCETS 25W MISC 1 each by Other route daily. Check blood sugar daily as directed (Patient taking differently: 1 each by Other route daily. Check blood sugar PRN as directed)  . PARoxetine (PAXIL) 20 MG tablet TAKE ONE-HALF (1/2) TABLET DAILY  . [DISCONTINUED] furosemide (LASIX) 40 MG tablet TAKE 1 TABLET DAILY  . [DISCONTINUED]  metoprolol tartrate (LOPRESSOR) 25 MG tablet TAKE 2 TABLETS TWICE A DAY (NEED OFFICE VISIT) (SCHEDULE AN APPOINTMENT FOR FUTURE REFILLS)     Allergies:   Patient has no known allergies.   Social History   Tobacco Use  . Smoking status: Former Smoker    Packs/day: 1.00    Years: 16.00    Pack years: 16.00    Types: Cigarettes    Quit date: 06/30/1977    Years since quitting: 41.0  . Smokeless tobacco: Never Used  Substance Use Topics  . Alcohol use: No  . Drug use: No     Family Hx: The patient's family history includes Colonic polyp in his sister; Heart attack in his father; Heart failure in his father; Hypertension in his father; Stroke in his mother. There is no history of Colon cancer or Stomach cancer.  ROS:   Please see the history of present illness.    All other systems reviewed and are negative.   Prior CV studies:   The following studies were reviewed today:  none  Labs/Other Tests and Data Reviewed:    EKG:  No ECG reviewed.  Recent Labs: 03/27/2018: ALT 12; BUN 18; Creatinine, Ser 1.11; Hemoglobin 13.0; Platelets 232.0; Potassium 4.2; Sodium 140   Recent Lipid Panel Lab Results  Component Value Date/Time   CHOL 154 03/27/2018 11:08 AM   TRIG 50.0 03/27/2018 11:08 AM   HDL 64.00 03/27/2018 11:08 AM   CHOLHDL 2 03/27/2018 11:08 AM   LDLCALC 80 03/27/2018 11:08 AM    Wt Readings from Last 3 Encounters:  07/12/18 215 lb (97.5 kg)  03/27/18 221 lb (100.2 kg)  03/27/18 221 lb 12.8 oz (100.6 kg)     Objective:    Vital Signs:  BP (!) 151/86   Pulse (!) 48   Temp 97.9 F (36.6 C)   Ht 5\' 7"  (1.702 m)   Wt 215 lb (97.5 kg)   BMI 33.67 kg/m   Repeat BP 137/78  VITAL SIGNS:  reviewed  ASSESSMENT & PLAN:    1. Permanent atrial fibrillation. Rate is well controlled. He is asymptomatic.   Continue metoprolol.  He is on chronic anticoagulation with Pradaxa. Renal function and Hgb have been stable  2. Congestive heart failure with chronic systolic  dysfunction. Ejection fraction is mildly reduced. Continue ACE inhibitor, beta blocker, and diuretic therapy. Encourage sodium restriction. Weight is stable.   3. Hypertension, BP is  controlled.   Medication refilled today   COVID-19 Education: The signs and symptoms of COVID-19 were discussed with the patient and how to seek care for testing (follow up with PCP or arrange E-visit).  The importance of social distancing was discussed today.  Time:   Today, I have spent 20 minutes with the patient with telehealth technology discussing the above problems.     Medication Adjustments/Labs and Tests  Ordered: Current medicines are reviewed at length with the patient today.  Concerns regarding medicines are outlined above.   Tests Ordered: No orders of the defined types were placed in this encounter.   Medication Changes: Meds ordered this encounter  Medications  . metoprolol tartrate (LOPRESSOR) 25 MG tablet    Sig: TAKE 2 TABLETS TWICE A DAY (NEED OFFICE VISIT) (SCHEDULE AN APPOINTMENT FOR FUTURE REFILLS)    Dispense:  360 tablet    Refill:  3  . furosemide (LASIX) 40 MG tablet    Sig: Take 1 tablet (40 mg total) by mouth daily.    Dispense:  90 tablet    Refill:  3    Disposition:  Follow up in 1 year(s)  Signed, Aamiyah Derrick [place], MD  07/12/2018 9:24 AM    [facility]

## 2018-07-12 ENCOUNTER — Telehealth (INDEPENDENT_AMBULATORY_CARE_PROVIDER_SITE_OTHER): Payer: Medicare Other | Admitting: Cardiology

## 2018-07-12 ENCOUNTER — Encounter: Payer: Self-pay | Admitting: Cardiology

## 2018-07-12 VITALS — BP 137/78 | HR 48 | Temp 97.9°F | Ht 67.0 in | Wt 215.0 lb

## 2018-07-12 DIAGNOSIS — I5022 Chronic systolic (congestive) heart failure: Secondary | ICD-10-CM

## 2018-07-12 DIAGNOSIS — I10 Essential (primary) hypertension: Secondary | ICD-10-CM

## 2018-07-12 DIAGNOSIS — I482 Chronic atrial fibrillation, unspecified: Secondary | ICD-10-CM | POA: Diagnosis not present

## 2018-07-12 DIAGNOSIS — E7800 Pure hypercholesterolemia, unspecified: Secondary | ICD-10-CM

## 2018-07-12 MED ORDER — FUROSEMIDE 40 MG PO TABS: 40.0000 mg | ORAL_TABLET | Freq: Every day | ORAL | 3 refills | Status: DC

## 2018-07-12 MED ORDER — METOPROLOL TARTRATE 25 MG PO TABS: ORAL_TABLET | ORAL | 3 refills | Status: DC

## 2018-07-12 NOTE — Patient Instructions (Signed)
Medication Instructions:  Continue same medications If you need a refill on your cardiac medications before your next appointment, please call your pharmacy.   Lab work: None ordered   Testing/Procedures: None ordered  Follow-Up: At [facility], you and your health needs are our priority.  As part of our continuing mission to provide you with exceptional heart care, we have created designated Provider Care Teams.  These Care Teams include your primary Cardiologist (physician) and Advanced Practice Providers (APPs -  Physician Assistants and Nurse Practitioners) who all work together to provide you with the care you need, when you need it. . Schedule follow up appointment in 12 months    Call 3 months before to shedule

## 2018-08-23 DIAGNOSIS — H401131 Primary open-angle glaucoma, bilateral, mild stage: Secondary | ICD-10-CM | POA: Diagnosis not present

## 2018-08-31 ENCOUNTER — Other Ambulatory Visit: Payer: Self-pay | Admitting: Internal Medicine

## 2018-09-16 ENCOUNTER — Other Ambulatory Visit: Payer: Self-pay | Admitting: Internal Medicine

## 2018-09-16 ENCOUNTER — Telehealth: Payer: Self-pay | Admitting: Pulmonary Disease

## 2018-09-16 DIAGNOSIS — G4733 Obstructive sleep apnea (adult) (pediatric): Secondary | ICD-10-CM

## 2018-09-16 NOTE — Telephone Encounter (Signed)
Pt last seen by Dr. Elsworth Soho 09/03/17. I see that there was a notation from pt's last OV that we would set pt up for a titration study and adjust machine settings as required. Order for CPAP titration was placed after pt's OV 09/03/17.  Checked pt's chart and it does not look like pt has had the cpap titration performed yet.  Called and spoke with pt in regards to the titration study and pt stated that he figured due to covid, the titration study got put on hold. Pt does want to go forward and see if he can get scheduled for the cpap titration study and then will need to be scheduled for a follow up OV with Dr. Elsworth Soho as he is coming close to needing supplies and due to it being over a year since he has been seen, he needs an OV after the titration study.  Routing to [facility] to see if they can get pt scheduled for cpap titration study.

## 2018-09-16 NOTE — Telephone Encounter (Signed)
We will need a new Cpap Titration order because the order from 08/2017 has been cancelled

## 2018-09-16 NOTE — Telephone Encounter (Signed)
Cpap titration has been reordered. Patient is aware that someone will reach out to him to get this scheduled. Once we have a date for the titration study, we will be able to get him scheduled to see RA. Patient verbalized understanding. Nothing further needed at time of call.

## 2018-09-23 ENCOUNTER — Other Ambulatory Visit (HOSPITAL_COMMUNITY)
Admission: RE | Admit: 2018-09-23 | Discharge: 2018-09-23 | Disposition: A | Discharge status: Home / Self Care | Payer: Medicare Other | Source: Ambulatory Visit | Attending: Pulmonary Disease | Admitting: Pulmonary Disease

## 2018-09-23 DIAGNOSIS — Z20828 Contact with and (suspected) exposure to other viral communicable diseases: Secondary | ICD-10-CM | POA: Diagnosis not present

## 2018-09-23 DIAGNOSIS — Z01812 Encounter for preprocedural laboratory examination: Secondary | ICD-10-CM | POA: Insufficient documentation

## 2018-09-23 LAB — SARS CORONAVIRUS 2 (TAT 6-24 HRS): SARS Coronavirus 2: NEGATIVE

## 2018-09-25 ENCOUNTER — Other Ambulatory Visit: Payer: Self-pay

## 2018-09-25 ENCOUNTER — Ambulatory Visit (HOSPITAL_BASED_OUTPATIENT_CLINIC_OR_DEPARTMENT_OTHER): Payer: Medicare Other | Attending: Pulmonary Disease | Admitting: Pulmonary Disease

## 2018-09-25 DIAGNOSIS — G4733 Obstructive sleep apnea (adult) (pediatric): Secondary | ICD-10-CM | POA: Diagnosis not present

## 2018-09-27 ENCOUNTER — Telehealth: Payer: Self-pay | Admitting: Pulmonary Disease

## 2018-09-27 DIAGNOSIS — G4733 Obstructive sleep apnea (adult) (pediatric): Secondary | ICD-10-CM

## 2018-09-27 NOTE — Telephone Encounter (Signed)
The order placed today states to change Bipap settings he only has a Cpap not Bipap so the order should be for Auto Bipap

## 2018-09-27 NOTE — Telephone Encounter (Signed)
Per our records order was placed today at 10:01 to change bipap settings.  Rodena Piety, please advise if anything further is needed.

## 2018-09-27 NOTE — Telephone Encounter (Signed)
Per titration study, change him to  Auto bipap EPAP min 12 , IPAP max 26, PS +4 cm OV in 4 weeks

## 2018-09-27 NOTE — Telephone Encounter (Signed)
Samuel Moyer, Samuel Moyer        We would need a whole new order for a BIPAP since the patient is only on CPAP. This is not a pressure change as the patient does not have a BIPAP. Also...a home BIPAP only goes up to 25.    Order needs to be on New Cpap template but for Bipap she Anderson Malta response above

## 2018-09-27 NOTE — Telephone Encounter (Signed)
Spoke with pt and notified of results per Dr.Alva . Pt verbalized understanding and denied any questions. Order to [facility] and appt scheduled with Beth in 4 wks

## 2018-09-27 NOTE — Addendum Note (Signed)
Addended by: Rosana Berger on: 09/27/2018 04:16 PM   Modules accepted: Orders

## 2018-09-27 NOTE — Telephone Encounter (Signed)
New order placed

## 2018-09-27 NOTE — Telephone Encounter (Signed)
Done

## 2018-09-27 NOTE — Procedures (Signed)
Patient Name: Samuel Moyer, Samuel Moyer Date: 09/25/2018 Gender: Male D.O.B: 11/13/1940 Age (years): 33 Referring Provider: Kara Mead MD, ABSM Height (inches): 68 Interpreting Physician: Kara Mead MD, ABSM Weight (lbs): 220 RPSGT: Zadie Rhine BMI: 33 MRN: [id number] Neck Size: 20.00 <br> <br> CLINICAL INFORMATION The patient is referred for a BiPAP titration to treat sleep apnea.    Date of NPSG:  04/06/10 (wt 200) showed severe obstructive sleep apnea with AHI 66/h, nadir desatn 75% corrrected by CPAP 10 cm to AHI 4.5/h. Higher pressures were associated with emergence of central events. He had persistent central on download reports on CPAP upto 18cm   SLEEP STUDY TECHNIQUE As per the AASM Manual for the Scoring of Sleep and Associated Events v2.3 (April 2016) with a hypopnea requiring 4% desaturations.  The channels recorded and monitored were frontal, central and occipital EEG, electrooculogram (EOG), submentalis EMG (chin), nasal and oral airflow, thoracic and abdominal wall motion, anterior tibialis EMG, snore microphone, electrocardiogram, and pulse oximetry. Bilevel positive airway pressure (BPAP) was initiated at the beginning of the study and titrated to treat sleep-disordered breathing.  MEDICATIONS Medications self-administered by patient taken the night of the study : [name]  RESPIRATORY PARAMETERS Optimal IPAP Pressure (cm): 26 AHI at Optimal Pressure (/hr) 0.0 Optimal EPAP Pressure (cm): 22   Overall Minimal O2 (%): 85.0 Minimal O2 at Optimal Pressure (%): 94.0 SLEEP ARCHITECTURE Start Time: 9:34:21 PM Stop Time: 4:16:56 AM Total Time (min): 402.6 Total Sleep Time (min): 340 Sleep Latency (min): 11.5 Sleep Efficiency (%): 84.5% REM Latency (min): [name] WASO (min): 51.1 Stage N1 (%): 8.5% Stage N2 (%): 89.0% Stage N3 (%): 2.5% Stage R (%): 0 Supine (%): 79.57 Arousal Index (/hr): 31.1     CARDIAC DATA The 2 lead EKG demonstrated sinus rhythm. The mean heart rate was 59.5  beats per minute. Other EKG findings include: PVCs.   LEG MOVEMENT DATA The total Periodic Limb Movements of Sleep (PLMS) were 0. The PLMS index was 0.0. A PLMS index of <15 is considered normal in adults.  IMPRESSIONS - An optimal biPAP pressure was selected for this patient ( 26 /22 cm of water) - Central sleep apnea was  noted during this titration (CAI = 4.6/h). - Moderete oxygen desaturations were observed during this titration (min O2 = 85.0%). - No snoring was audible during this study. - 2-lead EKG demonstrated: PVCs - Clinically significant periodic limb movements were not noted during this study. Arousals associated with PLMs were rare.   DIAGNOSIS - Obstructive Sleep Apnea (327.23 [G47.33 ICD-10])   RECOMMENDATIONS - Trial of BiPAP therapy on 26/22 cm H2O with a Large size Resmed Full Face Mask AirFit F20 mask and heated humidification.Alternatively autoBipap can be tried - Avoid alcohol, sedatives and other CNS depressants that may worsen sleep apnea and disrupt normal sleep architecture. - Sleep hygiene should be reviewed to assess factors that may improve sleep quality. - Weight management and regular exercise should be initiated or continued. - Return to [facility] Center for re-evaluation after 4 weeks of therapy   Kara Mead MD Board Certified in [place]

## 2018-09-27 NOTE — Telephone Encounter (Signed)
Order needs to be to change to Bipap see Dr. Bari Mantis note Samuel Noel, MD     09/27/18 8:53 AM Note   Per titration study, change him to  Auto bipap EPAP min 12 , IPAP max 26, PS +4 cm OV in 4 weeks     He does currently have Cpap see [facility] note Samuel Moyer, Samuel Moyer, Samuel Moyer; Samuel Moyer, Samuel Moyer, Samuel Moyer, this patient does not have a BIPAP. He only has a CPAP that he received from Samuel Moyer 04/20/2017.   If he needs to be switched to BIPAP, there will need to be clinical documentation (new titration) showing failed CPAP and movement to BIPAP

## 2018-10-16 NOTE — Progress Notes (Signed)
Subjective:    Patient ID: Samuel Posa., male    DOB: Jun 27, 1940, 78 y.o.   MRN: [id number]  HPI The patient is here for follow up.  He is not exercising regularly.    Afib, Hypertension: He is taking his medication daily. He is compliant with a low sodium diet.  He denies chest pain, palpitations, shortness of breath and regular headaches.  Prediabetes:  He is compliant with a low sugar/carbohydrate diet.  He is not exercising regularly.  Hyperlipidemia: He is taking his medication daily. He is compliant with a low fat/cholesterol diet. He denies myalgias.   Anxiety: He is taking his medication daily as prescribed. He denies any side effects from the medication.  He started taking them for anxiety and panic attacks related to claustrophobia.  He feels his anxiety is well controlled and he is happy with his current dose of medication.  For the past 6 months or so he has had occasional hallucinations.  It does not happen often, but when it does happen it typically occurs when he first wakes up.  The other night he had fallen asleep watching a baseball game and when he woke up he thought he saw his granddaughter trying out the light and then run across the room.  He knew it did not make sense because she would have been at their house, especially at that time at night.  He did go over turn the light on and no one was there.  The other things he has seen is typically a cat or dog and he thinks he sees something but nothing is there.  It has not occurred during the day.  He denies hearing anything that is not there.  He denies any other confusion, depression, anxiety.  He was wondering if this was related to the Paxil.    Medications and allergies reviewed with patient and updated if appropriate.  Patient Active Problem List   Diagnosis Date Noted  . Strain of left quadriceps 02/22/2018  . 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 Acute bronchitis 01/15/2018  . Cellulitis of right knee  01/04/2018  . Acute pain of right knee 12/25/2017  . Fall 12/27/2016  . History of gout 06/03/2015  . Prediabetes 03/05/2015  . Obesity 12/28/2014  . Venous (peripheral) insufficiency 02/16/2014  . OSA (obstructive sleep apnea) 05/24/2010  . ATRIAL FIBRILLATION  01/26/2010  . Chronic systolic heart failure ([place]) 01/26/2010  . Asthma 01/14/2010  . Hypercholesterolemia 10/21/2007  . Anxiety 10/21/2007  . Essential hypertension 02/26/2006  . COLONIC POLYPS, HX OF 02/26/2006    Current Outpatient Medications on File Prior to Visit  Medication Sig Dispense Refill  . amLODipine (NORVASC) 5 MG tablet TAKE ONE AND ONE-HALF TABLETS DAILY (NEED TO SCHEDULE APPOINTMENT) 135 tablet 3  . atorvastatin (LIPITOR) 10 MG tablet TAKE 1 TABLET DAILY 90 tablet 1  . benazepril (LOTENSIN) 40 MG tablet TAKE 1 TABLET DAILY 90 tablet 1  . dabigatran (PRADAXA) 150 MG CAPS capsule TAKE 1 CAPSULE EVERY 12 HOURS 180 capsule 3  . furosemide (LASIX) 40 MG tablet Take 1 tablet (40 mg total) by mouth daily. 90 tablet 3  . glucose blood (ONE TOUCH ULTRA TEST) test strip Check blood sugar daily prn as directed (Patient taking differently: Check blood sugar as directed) 100 each 0  . LUMIGAN 0.01 % SOLN Place 1 drop into both eyes at bedtime.     . metoprolol tartrate (LOPRESSOR) 25 MG tablet TAKE 2 TABLETS TWICE A  DAY (NEED OFFICE VISIT) (SCHEDULE AN APPOINTMENT FOR FUTURE REFILLS) 360 tablet 3  . Multiple Vitamin (MULTIVITAMIN) tablet Take 1 tablet by mouth daily.      [name] DELICA LANCETS [id number] MISC 1 each by Other route daily. Check blood sugar daily as directed (Patient taking differently: 1 each by Other route daily. Check blood sugar PRN as directed) 100 each 0  . PARoxetine (PAXIL) 20 MG tablet TAKE ONE-HALF (1/2) TABLET DAILY 45 tablet 0  . cephALEXin (KEFLEX) 500 MG capsule      No current facility-administered medications on file prior to visit.     Past Medical History:  Diagnosis Date  . A-fib ([place])    . Anxiety   . Atrial fibrillation ([place])   . CHF (congestive heart failure) ([place])   . Claustrophobia    Occasionally when flying   . Colitis   . Diabetes mellitus, type 2 ([place])   . Fracture of one rib, left side, initial encounter for closed fracture 12/27/2016   Occurred 12/21/16 after a fall at home.  Left anterior seventh rib  . Gout   . Hemorrhoids   . Hyperlipidemia   . Hypertension   . LV dysfunction    EF 40-45%  . OSA (obstructive sleep apnea)    CPAP machine   . PVC's (premature ventricular contractions)     Past Surgical History:  Procedure Laterality Date  . CARDIOVASCULAR STRESS TEST  03/02/2010   EF 50%  . US ECHOCARDIOGRAPHY  11/15/2009   EF 40-45%    Social History   Socioeconomic History  . Marital status: Married    Spouse name: Not on file  . Number of children: 3  . Years of education: Not on file  . Highest education level: Not on file  Occupational History  . Occupation: Retired    Fish farm manager: RETIRED    Comment: [employer]   Social Needs  . Financial resource strain: Not hard at all  . Food insecurity    Worry: Never true    Inability: Never true  . Transportation needs    Medical: No    Non-medical: No  Tobacco Use  . Smoking status: Former Smoker    Packs/day: 1.00    Years: 16.00    Pack years: 16.00    Types: Cigarettes    Quit date: 06/30/1977    Years since quitting: 41.3  . Smokeless tobacco: Never Used  Substance and Sexual Activity  . Alcohol use: No  . Drug use: No  . Sexual activity: Not on file  Lifestyle  . Physical activity    Days per week: 0 days    Minutes per session: 0 min  . Stress: Not at all  Relationships  . Social connections    Talks on phone: More than three times a week    Gets together: More than three times a week    Attends religious service: More than 4 times per year    Active member of club or organization: Yes    Attends meetings of clubs or organizations: More than 4 times per year     Relationship status: Married  Other Topics Concern  . Not on file  Social History Narrative   2 caffeine drinks daily     Family History  Problem Relation Age of Onset  . Hypertension Father   . Heart attack Father        Age 23 (MI)  . Heart failure Father   . Colonic polyp Sister  and Father  . Stroke Mother   . Colon cancer Neg Hx   . Stomach cancer Neg Hx     Review of Systems  Constitutional: Negative for chills and fever.  Respiratory: Negative for cough, shortness of breath and wheezing.   Cardiovascular: Positive for leg swelling (mild). Negative for chest pain and palpitations.  Neurological: Negative for dizziness and headaches.  Psychiatric/Behavioral: Positive for hallucinations (sees things that are not there - once a month  - only when waking up). Negative for confusion, decreased concentration and dysphoric mood. The patient is not nervous/anxious.        Objective:   Vitals:   10/17/18 1032  BP: 132/78  Pulse: 61  Temp: 97.8 F (36.6 C)  SpO2: 97%   BP Readings from Last 3 Encounters:  10/17/18 132/78  07/12/18 137/78  03/27/18 (!) 144/72   Wt Readings from Last 3 Encounters:  10/17/18 227 lb (103 kg)  09/25/18 220 lb (99.8 kg)  07/12/18 215 lb (97.5 kg)   Body mass index is 35.55 kg/m.   Physical Exam    Constitutional: Appears well-developed and well-nourished. No distress.  HENT:  Head: Normocephalic and atraumatic.  Neck: Neck supple. No tracheal deviation present. No thyromegaly present.  No cervical lymphadenopathy Cardiovascular: Normal rate, regular rhythm and normal heart sounds.  No murmur heard. No carotid bruit .  1+ pitting bl ankle edema L > R Pulmonary/Chest: Effort normal and breath sounds normal. No respiratory distress. No has no wheezes. No rales.  Skin: Skin is warm and dry. Not diaphoretic.  Psychiatric: Normal mood and affect. Behavior is normal.      Assessment & Plan:    See Problem List for Assessment  and Plan of chronic medical problems.

## 2018-10-17 ENCOUNTER — Other Ambulatory Visit: Payer: Self-pay

## 2018-10-17 ENCOUNTER — Ambulatory Visit (INDEPENDENT_AMBULATORY_CARE_PROVIDER_SITE_OTHER): Payer: Medicare Other | Admitting: Internal Medicine

## 2018-10-17 ENCOUNTER — Encounter: Payer: Self-pay | Admitting: Internal Medicine

## 2018-10-17 ENCOUNTER — Other Ambulatory Visit (INDEPENDENT_AMBULATORY_CARE_PROVIDER_SITE_OTHER): Payer: Medicare Other

## 2018-10-17 ENCOUNTER — Other Ambulatory Visit: Payer: Medicare Other

## 2018-10-17 VITALS — BP 132/78 | HR 61 | Temp 97.8°F | Ht 67.0 in | Wt 227.0 lb

## 2018-10-17 DIAGNOSIS — Z8739 Personal history of other diseases of the musculoskeletal system and connective tissue: Secondary | ICD-10-CM | POA: Diagnosis not present

## 2018-10-17 DIAGNOSIS — I4891 Unspecified atrial fibrillation: Secondary | ICD-10-CM

## 2018-10-17 DIAGNOSIS — R7303 Prediabetes: Secondary | ICD-10-CM

## 2018-10-17 DIAGNOSIS — E7800 Pure hypercholesterolemia, unspecified: Secondary | ICD-10-CM

## 2018-10-17 DIAGNOSIS — Z23 Encounter for immunization: Secondary | ICD-10-CM | POA: Diagnosis not present

## 2018-10-17 DIAGNOSIS — F419 Anxiety disorder, unspecified: Secondary | ICD-10-CM

## 2018-10-17 DIAGNOSIS — I10 Essential (primary) hypertension: Secondary | ICD-10-CM

## 2018-10-17 LAB — COMPREHENSIVE METABOLIC PANEL
ALT: 14 U/L (ref 0–53)
AST: 20 U/L (ref 0–37)
Albumin: 4.3 g/dL (ref 3.5–5.2)
Alkaline Phosphatase: 73 U/L (ref 39–117)
BUN: 20 mg/dL (ref 6–23)
CO2: 28 mEq/L (ref 19–32)
Calcium: 9.8 mg/dL (ref 8.4–10.5)
Chloride: 105 mEq/L (ref 96–112)
Creatinine, Ser: 1.25 mg/dL (ref 0.40–1.50)
GFR: 55.7 mL/min — ABNORMAL LOW (ref >60.00)
Glucose, Bld: 105 mg/dL — ABNORMAL HIGH (ref 70–99)
Potassium: 4.1 mEq/L (ref 3.5–5.1)
Sodium: 140 mEq/L (ref 135–145)
Total Bilirubin: 0.9 mg/dL (ref 0.2–1.2)
Total Protein: 7.1 g/dL (ref 6.0–8.3)

## 2018-10-17 LAB — CBC WITH DIFFERENTIAL/PLATELET
Basophils Absolute: 0 10*3/uL (ref 0.0–0.1)
Basophils Relative: 0.7 % (ref 0.0–3.0)
Eosinophils Absolute: 0.3 10*3/uL (ref 0.0–0.7)
Eosinophils Relative: 3.8 % (ref 0.0–5.0)
HCT: 43.1 % (ref 39.0–52.0)
Hemoglobin: 14.5 g/dL (ref 13.0–17.0)
Lymphocytes Relative: 33.4 % (ref 12.0–46.0)
Lymphs Abs: 2.2 10*3/uL (ref 0.7–4.0)
MCHC: 33.5 g/dL (ref 30.0–36.0)
MCV: 98 fl (ref 78.0–100.0)
Monocytes Absolute: 0.7 10*3/uL (ref 0.1–1.0)
Monocytes Relative: 9.8 % (ref 3.0–12.0)
Neutro Abs: 3.5 10*3/uL (ref 1.4–7.7)
Neutrophils Relative %: 52.3 % (ref 43.0–77.0)
Platelets: 189 10*3/uL (ref 150.0–400.0)
RBC: 4.4 Mil/uL (ref 4.22–5.81)
RDW: 15.3 % (ref 11.5–15.5)
WBC: 6.7 10*3/uL (ref 4.0–10.5)

## 2018-10-17 LAB — LIPID PANEL
Cholesterol: 148 mg/dL (ref 0–200)
HDL: 56.7 mg/dL (ref >39.00)
LDL Cholesterol: 79 mg/dL (ref 0–99)
NonHDL: 91.74
Total CHOL/HDL Ratio: 3
Triglycerides: 65 mg/dL (ref 0.0–149.0)
VLDL: 13 mg/dL (ref 0.0–40.0)

## 2018-10-17 LAB — URIC ACID: Uric Acid, Serum: 9.3 mg/dL — ABNORMAL HIGH (ref 4.0–7.8)

## 2018-10-17 LAB — HEMOGLOBIN A1C: Hgb A1c MFr Bld: 6 % (ref 4.6–6.5)

## 2018-10-17 NOTE — Assessment & Plan Note (Signed)
History of anxiety and panic attacks, especially related to claustrophobia Taking Paxil 10 mg daily, but questionable hallucinations from the medication No other concerning symptoms and hallucinations are fairly benign, but discussed options and he will go ahead and stop the Paxil We will see how he does without this-if he has further hallucinations he will need further evaluation of this If there are no further hallucinations, but he feels like he needs medication for his anxiety and we will consider trying a different medication

## 2018-10-17 NOTE — Assessment & Plan Note (Signed)
Check a1c Low sugar / carb diet Stressed regular exercise   

## 2018-10-17 NOTE — Assessment & Plan Note (Signed)
Check lipid panel  Continue daily statin Regular exercise and healthy diet encouraged  

## 2018-10-17 NOTE — Assessment & Plan Note (Signed)
History of gout Will check uric acid level

## 2018-10-17 NOTE — Assessment & Plan Note (Signed)
BP well controlled Current regimen effective and well tolerated Continue current medications at current doses CMP 

## 2018-10-17 NOTE — Assessment & Plan Note (Signed)
Rate controlled, asymptomatic Continue Pradaxa, metoprolol Following with cardiology CMP, CBC

## 2018-10-17 NOTE — Patient Instructions (Addendum)
  Tests ordered today. Your results will be released to [place] (or called to you) after review.  If any changes need to be made, you will be notified at that same time.  Flu immunization administered today.     Medications reviewed and updated.  Changes include :   Stop the paxil for now.  If the hallucinations persist let me know so we can evaluate further.    Please followup in 6 months

## 2018-10-25 ENCOUNTER — Encounter (HOSPITAL_COMMUNITY): Payer: Self-pay | Admitting: Emergency Medicine

## 2018-10-25 ENCOUNTER — Ambulatory Visit: Payer: Self-pay | Admitting: *Deleted

## 2018-10-25 ENCOUNTER — Other Ambulatory Visit: Payer: Self-pay

## 2018-10-25 ENCOUNTER — Emergency Department (HOSPITAL_COMMUNITY)
Admission: EM | Admit: 2018-10-25 | Discharge: 2018-10-26 | Disposition: A | Discharge status: Home / Self Care | Payer: Medicare Other | Attending: Emergency Medicine | Admitting: Emergency Medicine

## 2018-10-25 ENCOUNTER — Emergency Department (HOSPITAL_COMMUNITY): Payer: Medicare Other

## 2018-10-25 DIAGNOSIS — J45909 Unspecified asthma, uncomplicated: Secondary | ICD-10-CM | POA: Diagnosis not present

## 2018-10-25 DIAGNOSIS — I5022 Chronic systolic (congestive) heart failure: Secondary | ICD-10-CM | POA: Insufficient documentation

## 2018-10-25 DIAGNOSIS — I110 Hypertensive heart disease with heart failure: Secondary | ICD-10-CM | POA: Diagnosis not present

## 2018-10-25 DIAGNOSIS — E119 Type 2 diabetes mellitus without complications: Secondary | ICD-10-CM | POA: Diagnosis not present

## 2018-10-25 DIAGNOSIS — Z79899 Other long term (current) drug therapy: Secondary | ICD-10-CM | POA: Diagnosis not present

## 2018-10-25 DIAGNOSIS — Z87891 Personal history of nicotine dependence: Secondary | ICD-10-CM | POA: Insufficient documentation

## 2018-10-25 DIAGNOSIS — I4891 Unspecified atrial fibrillation: Secondary | ICD-10-CM | POA: Diagnosis not present

## 2018-10-25 DIAGNOSIS — Z20828 Contact with and (suspected) exposure to other viral communicable diseases: Secondary | ICD-10-CM | POA: Diagnosis not present

## 2018-10-25 DIAGNOSIS — R0602 Shortness of breath: Secondary | ICD-10-CM | POA: Insufficient documentation

## 2018-10-25 LAB — CBC WITH DIFFERENTIAL/PLATELET
Abs Immature Granulocytes: 0.02 10*3/uL (ref 0.00–0.07)
Basophils Absolute: 0 10*3/uL (ref 0.0–0.1)
Basophils Relative: 1 %
Eosinophils Absolute: 0.1 10*3/uL (ref 0.0–0.5)
Eosinophils Relative: 1 %
HCT: 45.6 % (ref 39.0–52.0)
Hemoglobin: 14.8 g/dL (ref 13.0–17.0)
Immature Granulocytes: 0 %
Lymphocytes Relative: 26 %
Lymphs Abs: 2.1 10*3/uL (ref 0.7–4.0)
MCH: 32.4 pg (ref 26.0–34.0)
MCHC: 32.5 g/dL (ref 30.0–36.0)
MCV: 99.8 fL (ref 80.0–100.0)
Monocytes Absolute: 0.6 10*3/uL (ref 0.1–1.0)
Monocytes Relative: 7 %
Neutro Abs: 5.2 10*3/uL (ref 1.7–7.7)
Neutrophils Relative %: 65 %
Platelets: 185 10*3/uL (ref 150–400)
RBC: 4.57 MIL/uL (ref 4.22–5.81)
RDW: 15.1 % (ref 11.5–15.5)
WBC: 8 10*3/uL (ref 4.0–10.5)
nRBC: 0 % (ref 0.0–0.2)

## 2018-10-25 LAB — BASIC METABOLIC PANEL
Anion gap: 10 (ref 5–15)
BUN: 21 mg/dL (ref 8–23)
CO2: 24 mmol/L (ref 22–32)
Calcium: 9.2 mg/dL (ref 8.9–10.3)
Chloride: 106 mmol/L (ref 98–111)
Creatinine, Ser: 1.14 mg/dL (ref 0.61–1.24)
GFR calc Af Amer: >60 mL/min (ref >60)
GFR calc non Af Amer: >60 mL/min (ref >60)
Glucose, Bld: 109 mg/dL — ABNORMAL HIGH (ref 70–99)
Potassium: 4 mmol/L (ref 3.5–5.1)
Sodium: 140 mmol/L (ref 135–145)

## 2018-10-25 LAB — BRAIN NATRIURETIC PEPTIDE: B Natriuretic Peptide: 476.3 pg/mL — ABNORMAL HIGH (ref 0.0–100.0)

## 2018-10-25 LAB — TROPONIN I (HIGH SENSITIVITY): Troponin I (High Sensitivity): 16 ng/L (ref <18)

## 2018-10-25 NOTE — Telephone Encounter (Signed)
   Reason for Disposition . [1] MODERATE difficulty breathing (e.g., speaks in phrases, SOB even at rest, pulse 100-120) AND [2] NEW-onset or WORSE than normal  Answer Assessment - Initial Assessment Questions 1. RESPIRATORY STATUS: "Describe your breathing?" (e.g., wheezing, shortness of breath, unable to speak, severe coughing)      Shortness of breath and wheezing  2. ONSET: "When did this breathing problem begin?"      30 minutes ago 3. PATTERN "Does the difficult breathing come and go, or has it been constant since it started?"      Constant  4. SEVERITY: "How bad is your breathing?" (e.g., mild, moderate, severe)    - MILD: No SOB at rest, mild SOB with walking, speaks normally in sentences, can lay down, no retractions, pulse < 100.    - MODERATE: SOB at rest, SOB with minimal exertion and prefers to sit, cannot lie down flat, speaks in phrases, mild retractions, audible wheezing, pulse 100-120.    - SEVERE: Very SOB at rest, speaks in single words, struggling to breathe, sitting hunched forward, retractions, pulse > 120      Moderate  5. RECURRENT SYMPTOM: "Have you had difficulty breathing before?" If so, ask: "When was the last time?" and "What happened that time?"    Has not experienced before 6. CARDIAC HISTORY: "Do you have any history of heart disease?" (e.g., heart attack, angina, bypass surgery, angioplasty)      Atrial fibrilation 7. LUNG HISTORY: "Do you have any history of lung disease?"  (e.g., pulmonary embolus, asthma, emphysema)     Denies any lung disease  8. CAUSE: "What do you think is causing the breathing problem?"     Possibly anxiety 9. OTHER SYMPTOMS: "Do you have any other symptoms? (e.g., dizziness, runny nose, cough, chest pain, fever)     Runny nose and cough  10. PREGNANCY: "Is there any chance you are pregnant?" "When was your last menstrual period?"       [name] 11. TRAVEL: "Have you traveled out of the country in the last month?" (e.g., travel history,  exposures)       No travel  Protocols used: BREATHING DIFFICULTY-A-AH  Patient states he started experiencing shortness of breath about 30 minutes ago.  He describes as fast breathing.  He sounds moderately short of breath talking on the phone.  He states he has had runny nose and cough in the mornings which he attributes to wearing a CPAP and weather change.  Advised patient he should go to ER for evaluation.  His wife can take him now.  Patient is agreeable.

## 2018-10-25 NOTE — ED Provider Notes (Signed)
[facility] DEPT Provider Note   CSN: [id number] Arrival date & time: 10/25/18  1727     History   Chief Complaint Chief Complaint  Patient presents with  . Shortness of Breath    HPI Samuel Moyer. is a 78 y.o. male.     78 year old male with possible history of A. Fib (baseline A. fib), CHF, hypertension, diabetes, OSA presents with complaint of rapid breathing.  Patient states that he missed his medications yesterday, stayed up late last night watching a ball game and slept until noon today, missing his morning medications.  Patient took his morning meds around 3:00 and at 330 began to feel like he was breathing fast.  Patient denies feeling short of breath, states he could breathe faster to avoid feeling short of breath.  Patient's wife noticed increased work of breathing which prompted patient to call his PCP who advised him to come to the emergency room.  Symptoms resolved after about an hour and a half when he was in triage in the ER.  Patient has remained symptom-free for several hours while awaiting room in the ER tonight.  Patient denies ever having chest pain, denies chest tightness, fevers, chills, nausea, vomiting.  Reports baseline lower extremity swelling.  Patient states that sometimes he begins to feel very anxious about his health which will escalate similar symptoms as experienced today.  Patient is prescribed Paxil, recently stopped taking the medication due to hallucinations, discontinuing medication has helped.  No other complaints or concerns.      Past Medical History:  Diagnosis Date  . A-fib ([place])   . Anxiety   . Atrial fibrillation ([place])   . CHF (congestive heart failure) ([place])   . Claustrophobia    Occasionally when flying   . Colitis   . Diabetes mellitus, type 2 ([place])   . Fracture of one rib, left side, initial encounter for closed fracture 12/27/2016   Occurred 12/21/16 after a fall at home.  Left anterior seventh rib   . Gout   . Hemorrhoids   . Hyperlipidemia   . Hypertension   . LV dysfunction    EF 40-45%  . OSA (obstructive sleep apnea)    CPAP machine   . PVC's (premature ventricular contractions)     Patient Active Problem List   Diagnosis Date Noted  . Strain of left quadriceps 02/22/2018  . 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 Cellulitis of right knee 01/04/2018  . Acute pain of right knee 12/25/2017  . Fall 12/27/2016  . History of gout 06/03/2015  . Prediabetes 03/05/2015  . Obesity 12/28/2014  . Venous (peripheral) insufficiency 02/16/2014  . OSA (obstructive sleep apnea) 05/24/2010  . ATRIAL FIBRILLATION  01/26/2010  . Chronic systolic heart failure ([place]) 01/26/2010  . Asthma 01/14/2010  . Hypercholesterolemia 10/21/2007  . Anxiety 10/21/2007  . Essential hypertension 02/26/2006  . COLONIC POLYPS, HX OF 02/26/2006    Past Surgical History:  Procedure Laterality Date  . CARDIOVASCULAR STRESS TEST  03/02/2010   EF 50%  . US ECHOCARDIOGRAPHY  11/15/2009   EF 40-45%        Home Medications    Prior to Admission medications   Medication Sig Start Date End Date Taking? Authorizing Provider  amLODipine (NORVASC) 5 MG tablet TAKE ONE AND ONE-HALF TABLETS DAILY (NEED TO SCHEDULE APPOINTMENT) Patient taking differently: Take 5 mg by mouth daily.  05/13/18  Yes Martinique, Peter M, MD  atorvastatin (LIPITOR) 10 MG tablet TAKE 1 TABLET  DAILY Patient taking differently: Take 10 mg by mouth daily.  02/27/18  Yes Burns, Claudina Lick, MD  benazepril (LOTENSIN) 40 MG tablet TAKE 1 TABLET DAILY Patient taking differently: Take 40 mg by mouth daily.  09/02/18  Yes Burns, Claudina Lick, MD  dabigatran (PRADAXA) 150 MG CAPS capsule TAKE 1 CAPSULE EVERY 12 HOURS Patient taking differently: Take 150 mg by mouth every 12 (twelve) hours.  05/13/18  Yes Martinique, Peter M, MD  furosemide (LASIX) 40 MG tablet Take 1 tablet (40 mg total) by mouth daily. 07/12/18  Yes Martinique, Peter M, MD  glucose blood (ONE TOUCH  ULTRA TEST) test strip Check blood sugar daily prn as directed Patient taking differently: Check blood sugar as directed 06/01/16  Yes Burns, Claudina Lick, MD  LUMIGAN 0.01 % SOLN Place 1 drop into both eyes at bedtime.  03/08/17  Yes [provider]  metoprolol tartrate (LOPRESSOR) 25 MG tablet TAKE 2 TABLETS TWICE A DAY (NEED OFFICE VISIT) (SCHEDULE AN APPOINTMENT FOR FUTURE REFILLS) Patient taking differently: Take 50 mg by mouth 2 (two) times daily. TAKE 2 TABLETS TWICE A DAY (NEED OFFICE VISIT) (SCHEDULE AN APPOINTMENT FOR FUTURE REFILLS) 07/12/18  Yes Martinique, Peter M, MD  Multiple Vitamin (MULTIVITAMIN) tablet Take 1 tablet by mouth daily.     Yes [provider]  [facility] DELICA LANCETS [id number] MISC 1 each by Other route daily. Check blood sugar daily as directed Patient taking differently: 1 each by Other route daily. Check blood sugar PRN as directed 03/05/15  Yes Burns, Claudina Lick, MD  PARoxetine (PAXIL) 20 MG tablet TAKE ONE-HALF (1/2) TABLET DAILY Patient not taking: Reported on 10/25/2018 09/16/18   Binnie Rail, MD    Family History Family History  Problem Relation Age of Onset  . Hypertension Father   . Heart attack Father        Age 66 (MI)  . Heart failure Father   . Colonic polyp Sister        and Father  . Stroke Mother   . Colon cancer Neg Hx   . Stomach cancer Neg Hx     Social History Social History   Tobacco Use  . Smoking status: Former Smoker    Packs/day: 1.00    Years: 16.00    Pack years: 16.00    Types: Cigarettes    Quit date: 06/30/1977    Years since quitting: 41.3  . Smokeless tobacco: Never Used  Substance Use Topics  . Alcohol use: No  . Drug use: No     Allergies   Patient has no known allergies.   Review of Systems Review of Systems  Constitutional: Negative for chills, diaphoresis and fever.  Respiratory: Positive for shortness of breath. Negative for chest tightness.   Cardiovascular: Negative for chest pain and palpitations.   Gastrointestinal: Negative for abdominal pain, constipation, diarrhea, nausea and vomiting.  Musculoskeletal: Negative for arthralgias and myalgias.  Skin: Negative for rash and wound.  Allergic/Immunologic: Positive for immunocompromised state.  Neurological: Negative for dizziness, weakness and light-headedness.  Psychiatric/Behavioral: Negative for confusion.  All other systems reviewed and are negative.    Physical Exam Updated Vital Signs BP (!) 182/96 (BP Location: Right Arm)   Pulse 76   Temp 98.3 F (36.8 C) (Oral)   Resp (!) 26   SpO2 97%   Physical Exam Vitals signs and nursing note reviewed.  Constitutional:      General: He is not in acute distress.    Appearance: He  is well-developed. He is not diaphoretic.  HENT:     Head: Normocephalic and atraumatic.  Cardiovascular:     Rate and Rhythm: Normal rate. Rhythm irregular.     Pulses: Normal pulses.  Pulmonary:     Effort: Pulmonary effort is normal.     Breath sounds: Examination of the right-lower field reveals decreased breath sounds. Examination of the left-lower field reveals decreased breath sounds. Decreased breath sounds present. No wheezing, rhonchi or rales.  Abdominal:     Palpations: Abdomen is soft.     Tenderness: There is no abdominal tenderness.  Musculoskeletal:     Right lower leg: Edema present.     Left lower leg: Edema present.     Comments: Mild bilateral lower leg pitting edema  Skin:    General: Skin is warm and dry.     Findings: No erythema.     Nails: There is no clubbing.   Neurological:     Mental Status: He is alert and oriented to person, place, and time.  Psychiatric:        Behavior: Behavior normal.      ED Treatments / Results  Labs (all labs ordered are listed, but only abnormal results are displayed) Labs Reviewed  BASIC METABOLIC PANEL - Abnormal; Notable for the following components:      Result Value   Glucose, Bld 109 (*)    All other components within  normal limits  BRAIN NATRIURETIC PEPTIDE - Abnormal; Notable for the following components:   B Natriuretic Peptide 476.3 (*)    All other components within normal limits  CBC WITH DIFFERENTIAL/PLATELET  TROPONIN I (HIGH SENSITIVITY)  TROPONIN I (HIGH SENSITIVITY)  TROPONIN I (HIGH SENSITIVITY)    EKG EKG Interpretation  Date/Time:  Friday October 25 2018 23:17:59 EDT Ventricular Rate:  74 PR Interval:    QRS Duration: 142 QT Interval:  432 QTC Calculation: 480 R Axis:   -11 Text Interpretation:  Atrial fibrillation Premature ventricular complexes Right bundle branch block When compared with ECG of EARLIER SAME DATE No significant change was found Confirmed by Delora Fuel ([id number]) on 10/25/2018 11:37:30 PM   Radiology Dg Chest 2 View  Result Date: 10/25/2018 CLINICAL DATA:  Shortness of breath beginning today. EXAM: CHEST - 2 VIEW COMPARISON:  01/15/2018 FINDINGS: Chronic cardiomegaly. Aortic atherosclerosis. It has tissue pulmonary edema. No consolidation, collapse or effusion. IMPRESSION: Cardiomegaly. Aortic atherosclerosis. Interstitial pulmonary edema. Findings consistent with congestive heart failure. Electronically Signed   By: Nelson Chimes M.D.   On: 10/25/2018 18:27    Procedures Procedures (including critical care time)  Medications Ordered in ED Medications - No data to display   Initial Impression / Assessment and Plan / ED Course  I have reviewed the triage vital signs and the nursing notes.  Pertinent labs & imaging results that were available during my care of the patient were reviewed by me and considered in my medical decision making (see chart for details).  Clinical Course as of Oct 26 55  Sat Oct 26, 2018  0013 78yo male presents with complaint of breathing fast, onset 3:30PM today, resolved around 5PM while in triage in the ER. Patient was first seen 5 hours later and had remained symptom free for his 5 hour wait, feeling well at time of exam. Patient  states he forgot to take his medicines yesterday, took his morning meds at [facility] today just prior to onset of symptoms. Patient has a history of anxiety and a-fib, patient wasn't sure  if his symptoms were due to his a-fib or due to his anxiety. Denied ever having CP, states he was never [facility] because he was able to breathe fast enough that he was not short of breath.  On exam, patient is well appearing, mild pitting edema to bilateral lower legs which he and his wife state is his baseline.  Lung sounds slightly diminished in the bases otherwise unremarkable. CBC is within normal limits, BMP unremarkable, BNP elevated 476, no history of CHF.  Initial troponin is 16.  Chest x-ray shows CHF.   [LM]    Clinical Course User Index [LM] Tacy Learn, PA-C       Final Clinical Impressions(s) / ED Diagnoses   Final diagnoses:  SOB (shortness of breath)    ED Discharge Orders    None       Tacy Learn, PA-C 10/26/18 0058    Lucrezia Starch, MD 10/26/18 [phone]

## 2018-10-25 NOTE — ED Triage Notes (Signed)
Pt c/o SOB that started this afternoon, reports uses Cpap machine at night and has runny nose and cough when first wakes up. Hx a fib

## 2018-10-25 NOTE — ED Notes (Signed)
Pt ambulated to the bathroom without assistance. Gait steady  

## 2018-10-26 DIAGNOSIS — R0602 Shortness of breath: Secondary | ICD-10-CM | POA: Diagnosis not present

## 2018-10-26 LAB — TROPONIN I (HIGH SENSITIVITY): Troponin I (High Sensitivity): 17 ng/L (ref <18)

## 2018-10-26 NOTE — ED Notes (Signed)
Pt verbalized discharge instructions and follow up care. Alert and ambulatory. No iv. Wife is driving pt home

## 2018-10-26 NOTE — Discharge Instructions (Addendum)
Work-up today was reassuring.  COVID test should rest in the next 18-24 hours.  If positive you will be notified and will need to quarantine for up to 2 weeks.  See formal CDC guidelines below. Follow-up with your primary care doctor and/or your cardiologist. Return here for any new or acute changes.     Person Under Monitoring Name: Samuel Moyer.  Location: [address] [place] [address]   Infection Prevention Recommendations for Individuals Confirmed to have, or Being Evaluated for, 2019 Novel Coronavirus (COVID-19) Infection Who Receive Care at Home  Individuals who are confirmed to have, or are being evaluated for, COVID-19 should follow the prevention steps below until a healthcare provider or local or state health department says they can return to normal activities.  Stay home except to get medical care You should restrict activities outside your home, except for getting medical care. Do not go to work, school, or public areas, and do not use public transportation or taxis.  Call ahead before visiting your doctor Before your medical appointment, call the healthcare provider and tell them that you have, or are being evaluated for, COVID-19 infection. This will help the healthcare providers office take steps to keep other people from getting infected. Ask your healthcare provider to call the local or state health department.  Monitor your symptoms Seek prompt medical attention if your illness is worsening (e.g., difficulty breathing). Before going to your medical appointment, call the healthcare provider and tell them that you have, or are being evaluated for, COVID-19 infection. Ask your healthcare provider to call the local or state health department.  Wear a facemask You should wear a facemask that covers your nose and mouth when you are in the same room with other people and when you visit a healthcare provider. People who live with or visit you should also wear a  facemask while they are in the same room with you.  Separate yourself from other people in your home As much as possible, you should stay in a different room from other people in your home. Also, you should use a separate bathroom, if available.  Avoid sharing household items You should not share dishes, drinking glasses, cups, eating utensils, towels, bedding, or other items with other people in your home. After using these items, you should wash them thoroughly with soap and water.  Cover your coughs and sneezes Cover your mouth and nose with a tissue when you cough or sneeze, or you can cough or sneeze into your sleeve. Throw used tissues in a lined trash can, and immediately wash your hands with soap and water for at least 20 seconds or use an alcohol-based hand rub.  Wash your [facility] your hands often and thoroughly with soap and water for at least 20 seconds. You can use an alcohol-based hand sanitizer if soap and water are not available and if your hands are not visibly dirty. Avoid touching your eyes, nose, and mouth with unwashed hands.   Prevention Steps for Caregivers and Household Members of Individuals Confirmed to have, or Being Evaluated for, COVID-19 Infection Being Cared for in the Home  If you live with, or provide care at home for, a person confirmed to have, or being evaluated for, COVID-19 infection please follow these guidelines to prevent infection:  Follow healthcare providers instructions Make sure that you understand and can help the patient follow any healthcare provider instructions for all care.  Provide for the patients basic needs You should help  the patient with basic needs in the home and provide support for getting groceries, prescriptions, and other personal needs.  Monitor the patients symptoms If they are getting sicker, call his or her medical provider and tell them that the patient has, or is being evaluated for, COVID-19 infection.  This will help the healthcare providers office take steps to keep other people from getting infected. Ask the healthcare provider to call the local or state health department.  Limit the number of people who have contact with the patient If possible, have only one caregiver for the patient. Other household members should stay in another home or place of residence. If this is not possible, they should stay in another room, or be separated from the patient as much as possible. Use a separate bathroom, if available. Restrict visitors who do not have an essential need to be in the home.  Keep older adults, very young children, and other sick people away from the patient Keep older adults, very young children, and those who have compromised immune systems or chronic health conditions away from the patient. This includes people with chronic heart, lung, or kidney conditions, diabetes, and cancer.  Ensure good ventilation Make sure that shared spaces in the home have good air flow, such as from an air conditioner or an opened window, weather permitting.  Wash your hands often Wash your hands often and thoroughly with soap and water for at least 20 seconds. You can use an alcohol based hand sanitizer if soap and water are not available and if your hands are not visibly dirty. Avoid touching your eyes, nose, and mouth with unwashed hands. Use disposable paper towels to dry your hands. If not available, use dedicated cloth towels and replace them when they become wet.  Wear a facemask and gloves Wear a disposable facemask at all times in the room and gloves when you touch or have contact with the patients blood, body fluids, and/or secretions or excretions, such as sweat, saliva, sputum, nasal mucus, vomit, urine, or feces.  Ensure the mask fits over your nose and mouth tightly, and do not touch it during use. Throw out disposable facemasks and gloves after using them. Do not reuse. Wash your hands  immediately after removing your facemask and gloves. If your personal clothing becomes contaminated, carefully remove clothing and launder. Wash your hands after handling contaminated clothing. Place all used disposable facemasks, gloves, and other waste in a lined container before disposing them with other household waste. Remove gloves and wash your hands immediately after handling these items.  Do not share dishes, glasses, or other household items with the patient Avoid sharing household items. You should not share dishes, drinking glasses, cups, eating utensils, towels, bedding, or other items with a patient who is confirmed to have, or being evaluated for, COVID-19 infection. After the person uses these items, you should wash them thoroughly with soap and water.  Wash laundry thoroughly Immediately remove and wash clothes or bedding that have blood, body fluids, and/or secretions or excretions, such as sweat, saliva, sputum, nasal mucus, vomit, urine, or feces, on them. Wear gloves when handling laundry from the patient. Read and follow directions on labels of laundry or clothing items and detergent. In general, wash and dry with the warmest temperatures recommended on the label.  Clean all areas the individual has used often Clean all touchable surfaces, such as counters, tabletops, doorknobs, bathroom fixtures, toilets, phones, keyboards, tablets, and bedside tables, every day. Also, clean any surfaces  that may have blood, body fluids, and/or secretions or excretions on them. Wear gloves when cleaning surfaces the patient has come in contact with. Use a diluted bleach solution (e.g., dilute bleach with 1 part bleach and 10 parts water) or a household disinfectant with a label that says EPA-registered for coronaviruses. To make a bleach solution at home, add 1 tablespoon of bleach to 1 quart (4 cups) of water. For a larger supply, add  cup of bleach to 1 gallon (16 cups) of water. Read  labels of cleaning products and follow recommendations provided on product labels. Labels contain instructions for safe and effective use of the cleaning product including precautions you should take when applying the product, such as wearing gloves or eye protection and making sure you have good ventilation during use of the product. Remove gloves and wash hands immediately after cleaning.  Monitor yourself for signs and symptoms of illness Caregivers and household members are considered close contacts, should monitor their health, and will be asked to limit movement outside of the home to the extent possible. Follow the monitoring steps for close contacts listed on the symptom monitoring form.   ? If you have additional questions, contact your local health department or call the epidemiologist on call at [phone] (available 24/7). ? This guidance is subject to change. For the most up-to-date guidance from [facility], please refer to their website: [email or web address]

## 2018-10-26 NOTE — ED Provider Notes (Signed)
Assumed care from [facility] at shift change.  See prior note for full H&P.  Briefly 78 year old male came in today after an episode of shortness of breath.  Has been largely asymptomatic since that time.  Has history of anxiety and A. fib.  He is in A. fib but this is rate controlled, this seems to be a chronic issue.  Work-up thus far is reassuring.  It does seem that patient has been having little bit of anxiety about possible exposure to COVID from family member.  Plan: Delta troponin is pending.  If negative, patient can have outpatient COVID test and be discharged to follow-up with his cardiologist.   Results for orders placed or performed during the hospital encounter of 10/25/18  CBC with Differential  Result Value Ref Range   WBC 8.0 4.0 - 10.5 K/uL   RBC 4.57 4.22 - 5.81 MIL/uL   Hemoglobin 14.8 13.0 - 17.0 g/dL   HCT 45.6 39.0 - 52.0 %   MCV 99.8 80.0 - 100.0 fL   MCH 32.4 26.0 - 34.0 pg   MCHC 32.5 30.0 - 36.0 g/dL   RDW 15.1 11.5 - 15.5 %   Platelets 185 150 - 400 K/uL   nRBC 0.0 0.0 - 0.2 %   Neutrophils Relative % 65 %   Neutro Abs 5.2 1.7 - 7.7 K/uL   Lymphocytes Relative 26 %   Lymphs Abs 2.1 0.7 - 4.0 K/uL   Monocytes Relative 7 %   Monocytes Absolute 0.6 0.1 - 1.0 K/uL   Eosinophils Relative 1 %   Eosinophils Absolute 0.1 0.0 - 0.5 K/uL   Basophils Relative 1 %   Basophils Absolute 0.0 0.0 - 0.1 K/uL   Immature Granulocytes 0 %   Abs Immature Granulocytes 0.02 0.00 - 0.07 K/uL  Basic metabolic panel  Result Value Ref Range   Sodium 140 135 - 145 mmol/L   Potassium 4.0 3.5 - 5.1 mmol/L   Chloride 106 98 - 111 mmol/L   CO2 24 22 - 32 mmol/L   Glucose, Bld 109 (H) 70 - 99 mg/dL   BUN 21 8 - 23 mg/dL   Creatinine, Ser 1.14 0.61 - 1.24 mg/dL   Calcium 9.2 8.9 - 10.3 mg/dL   GFR calc non Af Amer >60 >60 mL/min   GFR calc Af Amer >60 >60 mL/min   Anion gap 10 5 - 15  Brain natriuretic peptide  Result Value Ref Range   B Natriuretic Peptide 476.3 (H) 0.0 -  100.0 pg/mL  Troponin I (High Sensitivity)  Result Value Ref Range   Troponin I (High Sensitivity) 16 <18 ng/L  Troponin I (High Sensitivity)  Result Value Ref Range   Troponin I (High Sensitivity) 17 <18 ng/L   Dg Chest 2 View  Result Date: 10/25/2018 CLINICAL DATA:  Shortness of breath beginning today. EXAM: CHEST - 2 VIEW COMPARISON:  01/15/2018 FINDINGS: Chronic cardiomegaly. Aortic atherosclerosis. It has tissue pulmonary edema. No consolidation, collapse or effusion. IMPRESSION: Cardiomegaly. Aortic atherosclerosis. Interstitial pulmonary edema. Findings consistent with congestive heart failure. Electronically Signed   By: Nelson Chimes M.D.   On: 10/25/2018 18:27   Delta trop is negative.  Patient's BP has come down.  Still slightly elevated but is due for second dose of BP meds since he got off schedule today.  He will take this once he returns home.  He remains asymptomatic. OP COVID swab was sent.  He can follow-up with cardiology/PCP as an OP.  Return here for  any new/acute changes.   Larene Pickett, PA-C [id number]    Delora Fuel, [place] 123456 [phone]

## 2018-10-27 LAB — NOVEL CORONAVIRUS, NAA (HOSP ORDER, SEND-OUT TO REF LAB; TAT 18-24 HRS): SARS-CoV-2, NAA: NOT DETECTED

## 2018-10-28 ENCOUNTER — Ambulatory Visit: Payer: Medicare Other | Admitting: Primary Care

## 2018-10-31 NOTE — Progress Notes (Signed)
Subjective:    Patient ID: Samuel Moyer., male    DOB: May 28, 1940, 78 y.o.   MRN: [id number]  HPI The patient is here for follow up.  ED 9/25 for SOB.  He missed his medications the day prior, stayed up late the night before watching a ball game and slept until noon causing him to miss his morning medications the day he went to the ED.   He took his morning medications around 3pm and at 3:30 pm he started to feel like he was breathing fast.  He denied feeling SOB - he could breathe faster to avoid feeling SOB.  His symptoms resolved after about 1.5 hrs being in triage in the ED.  He denied ever having CP, fever/chills, nausea/vomiting.  There was no change in his lower extremity swelling.  He does have anxiety regarding his health and that sometimes escalates his symptoms.  He recently stopped paxil due to having hallucinations.    In ED.  EGK showed afib w/o acute changes.  Initial trop 16 w/o change with second toponin.  BNP 476.  CBC, BMP unremarkable.  CXR showed CHF.  COVID negative.   He feels good.  He thinks it was an anxiety attack.  He restarted the paxil 10 mg daily.  He denies any hallucinations since restarting it.    Besides going up the stairs carrying something he denies SOB since being in the ED.  He denies palpitations, chest pain or lightheadedness.  His leg swelling is stable.  He is taking all his medications daily.     Medications and allergies reviewed with patient and updated if appropriate.  Patient Active Problem List   Diagnosis Date Noted   Strain of left quadriceps 02/22/2018   Hemarthrosis of left knee 02/16/2018   Cellulitis of right knee 01/04/2018   Acute pain of right knee 12/25/2017   Fall 12/27/2016   History of gout 06/03/2015   Prediabetes 03/05/2015   Obesity 12/28/2014   Venous (peripheral) insufficiency 02/16/2014   OSA (obstructive sleep apnea) 05/24/2010   ATRIAL FIBRILLATION  0000000   Chronic systolic heart failure  ([place]) 01/26/2010   Asthma 01/14/2010   Hypercholesterolemia 10/21/2007   Anxiety 10/21/2007   Essential hypertension 02/26/2006   COLONIC POLYPS, HX OF 02/26/2006    Current Outpatient Medications on File Prior to Visit  Medication Sig Dispense Refill   amLODipine (NORVASC) 5 MG tablet TAKE ONE AND ONE-HALF TABLETS DAILY (NEED TO SCHEDULE APPOINTMENT) (Patient taking differently: Take 5 mg by mouth daily. ) 135 tablet 3   atorvastatin (LIPITOR) 10 MG tablet TAKE 1 TABLET DAILY (Patient taking differently: Take 10 mg by mouth daily. ) 90 tablet 1   benazepril (LOTENSIN) 40 MG tablet TAKE 1 TABLET DAILY (Patient taking differently: Take 40 mg by mouth daily. ) 90 tablet 1   dabigatran (PRADAXA) 150 MG CAPS capsule TAKE 1 CAPSULE EVERY 12 HOURS (Patient taking differently: Take 150 mg by mouth every 12 (twelve) hours. ) 180 capsule 3   furosemide (LASIX) 40 MG tablet Take 1 tablet (40 mg total) by mouth daily. 90 tablet 3   glucose blood (ONE TOUCH ULTRA TEST) test strip Check blood sugar daily prn as directed (Patient taking differently: Check blood sugar as directed) 100 each 0   LUMIGAN 0.01 % SOLN Place 1 drop into both eyes at bedtime.      metoprolol tartrate (LOPRESSOR) 25 MG tablet TAKE 2 TABLETS TWICE A DAY (NEED OFFICE VISIT) (SCHEDULE AN APPOINTMENT FOR  FUTURE REFILLS) (Patient taking differently: Take 50 mg by mouth 2 (two) times daily. TAKE 2 TABLETS TWICE A DAY (NEED OFFICE VISIT) (SCHEDULE AN APPOINTMENT FOR FUTURE REFILLS)) 360 tablet 3   Multiple Vitamin (MULTIVITAMIN) tablet Take 1 tablet by mouth daily.       ONETOUCH DELICA LANCETS 99991111 MISC 1 each by Other route daily. Check blood sugar daily as directed (Patient taking differently: 1 each by Other route daily. Check blood sugar PRN as directed) 100 each 0   PARoxetine (PAXIL) 20 MG tablet TAKE ONE-HALF (1/2) TABLET DAILY 45 tablet 0   No current facility-administered medications on file prior to visit.      Past Medical History:  Diagnosis Date   A-fib [facility])    Anxiety    Atrial fibrillation (H[facility])    CHF (congestive heart failure) ([place])    Claustrophobia    Occasionally when flying    Colitis    Diabetes mellitus, type 2 ([place])    Fracture of one rib, left side, initial encounter for closed fracture 12/27/2016   Occurred 12/21/16 after a fall at home.  Left anterior seventh rib   Gout    Hemorrhoids    Hyperlipidemia    Hypertension    LV dysfunction    EF 40-45%   OSA (obstructive sleep apnea)    CPAP machine    PVC's (premature ventricular contractions)     Past Surgical History:  Procedure Laterality Date   CARDIOVASCULAR STRESS TEST  03/02/2010   EF 50%   US ECHOCARDIOGRAPHY  11/15/2009   EF 40-45%    Social History   Socioeconomic History   Marital status: Married    Spouse name: Not on file   Number of children: 3   Years of education: Not on file   Highest education level: Not on file  Occupational History   Occupation: Retired    Fish farm manager: RETIRED    Comment: [employer]   Social Designer, fashion/clothing strain: Not hard at all   Food insecurity    Worry: Never true    Inability: Never true   Transportation needs    Medical: No    Non-medical: No  Tobacco Use   Smoking status: Former Smoker    Packs/day: 1.00    Years: 16.00    Pack years: 16.00    Types: Cigarettes    Quit date: 06/30/1977    Years since quitting: 41.3   Smokeless tobacco: Never Used  Substance and Sexual Activity   Alcohol use: No   Drug use: No   Sexual activity: Not on file  Lifestyle   Physical activity    Days per week: 0 days    Minutes per session: 0 min   Stress: Not at all  Relationships   Social connections    Talks on phone: More than three times a week    Gets together: More than three times a week    Attends religious service: More than 4 times per year    Active member of club or organization: Yes    Attends meetings  of clubs or organizations: More than 4 times per year    Relationship status: Married  Other Topics Concern   Not on file  Social History Narrative   2 caffeine drinks daily     Family History  Problem Relation Age of Onset   Hypertension Father    Heart attack Father        Age 81 (MI)   Heart  failure Father    Colonic polyp Sister        and Father   Stroke Mother    Colon cancer Neg Hx    Stomach cancer Neg Hx     Review of Systems  Constitutional: Negative for fever.  HENT: Positive for rhinorrhea (in morning).   Respiratory: Negative for cough, shortness of breath and wheezing.   Cardiovascular: Negative for chest pain, palpitations and leg swelling.  Neurological: Negative for light-headedness and headaches.       Objective:   Vitals:   11/01/18 1300  BP: 140/80  Pulse: (!) 57  Temp: 97.6 F (36.4 C)  SpO2: 96%   BP Readings from Last 3 Encounters:  11/01/18 140/80  10/26/18 (!) 177/96  10/17/18 132/78   Wt Readings from Last 3 Encounters:  11/01/18 209 lb (94.8 kg)  10/17/18 227 lb (103 kg)  09/25/18 220 lb (99.8 kg)   Body mass index is 32.73 kg/m.   Physical Exam    Constitutional: Appears well-developed and well-nourished. No distress.  HENT:  Head: Normocephalic and atraumatic.  Neck: Neck supple. No tracheal deviation present. No thyromegaly present.  No cervical lymphadenopathy Cardiovascular: Normal rate, regular rhythm and normal heart sounds.  No murmur heard. No carotid bruit .  1 + RLE edema and mild LLE edema Pulmonary/Chest: Effort normal and breath sounds normal. No respiratory distress. No has no wheezes. No rales.  Skin: Skin is warm and dry. Not diaphoretic.  Psychiatric: Normal mood and affect. Behavior is normal.      Assessment & Plan:    See Problem List for Assessment and Plan of chronic medical problems.

## 2018-11-01 ENCOUNTER — Other Ambulatory Visit: Payer: Self-pay

## 2018-11-01 ENCOUNTER — Other Ambulatory Visit (INDEPENDENT_AMBULATORY_CARE_PROVIDER_SITE_OTHER): Payer: Medicare Other

## 2018-11-01 ENCOUNTER — Ambulatory Visit (INDEPENDENT_AMBULATORY_CARE_PROVIDER_SITE_OTHER): Payer: Medicare Other | Admitting: Internal Medicine

## 2018-11-01 ENCOUNTER — Encounter: Payer: Self-pay | Admitting: Internal Medicine

## 2018-11-01 DIAGNOSIS — I10 Essential (primary) hypertension: Secondary | ICD-10-CM

## 2018-11-01 DIAGNOSIS — R0602 Shortness of breath: Secondary | ICD-10-CM

## 2018-11-01 DIAGNOSIS — F419 Anxiety disorder, unspecified: Secondary | ICD-10-CM | POA: Diagnosis not present

## 2018-11-01 DIAGNOSIS — R0609 Other forms of dyspnea: Secondary | ICD-10-CM | POA: Insufficient documentation

## 2018-11-01 LAB — BRAIN NATRIURETIC PEPTIDE: Pro B Natriuretic peptide (BNP): 599 pg/mL — ABNORMAL HIGH (ref 0.0–100.0)

## 2018-11-01 NOTE — Assessment & Plan Note (Signed)
Feels the SOB was related to anxiety He restarted the paxil 10 mg daily - continue

## 2018-11-01 NOTE — Patient Instructions (Addendum)
Have blood work today.    Medications reviewed and updated.  Changes include :   none

## 2018-11-01 NOTE — Assessment & Plan Note (Signed)
BP slightly high today Will continue current medications  Stressed weight loss, stressed regular exercise

## 2018-11-01 NOTE — Assessment & Plan Note (Addendum)
Not true SOB per patient - he feels it was anxiety He did miss his am meds but took them later in the day Had slightly elevated BNP and pulm edema on CXR - ? Some fluid overload from taking lasix later Check BNP - clinically mild fluid overload today

## 2018-11-02 ENCOUNTER — Encounter: Payer: Self-pay | Admitting: Internal Medicine

## 2018-11-04 ENCOUNTER — Telehealth: Payer: Self-pay

## 2018-11-04 NOTE — Telephone Encounter (Signed)
Copied from [place] [id number]. Topic: General - Other >> Nov 04, 2018 10:21 AM Samuel Moyer wrote: Reason for CRM: Patient is requesting PCP nurse to call back regarding his abnormal blood test. Call back [phone]

## 2018-11-04 NOTE — Telephone Encounter (Signed)
Pt aware of results 

## 2018-11-25 ENCOUNTER — Encounter: Payer: Self-pay | Admitting: Pulmonary Disease

## 2018-11-25 ENCOUNTER — Ambulatory Visit (INDEPENDENT_AMBULATORY_CARE_PROVIDER_SITE_OTHER): Payer: Medicare Other | Admitting: Pulmonary Disease

## 2018-11-25 ENCOUNTER — Ambulatory Visit: Payer: Medicare Other | Admitting: Primary Care

## 2018-11-25 ENCOUNTER — Other Ambulatory Visit: Payer: Self-pay

## 2018-11-25 VITALS — BP 130/80 | HR 55 | Temp 97.4°F | Ht 67.0 in | Wt 225.4 lb

## 2018-11-25 DIAGNOSIS — G4733 Obstructive sleep apnea (adult) (pediatric): Secondary | ICD-10-CM

## 2018-11-25 NOTE — Assessment & Plan Note (Signed)
Uncontrolled AHI Increased central apneas Persistent leaks with mask  Plan: Continue BiPAP therapy at this time Present to your DME company for a mask fitting Will adjust IPAP pressures from 25 >> 17 to hopefully help with central apneas as well as AHI Follow-up with our office in 4 to 6 weeks

## 2018-11-25 NOTE — Progress Notes (Signed)
@Patient  ID: Samuel Moyer., male    DOB: [birth year], 78 y.o.   MRN: [id number]  Chief Complaint  Patient presents with  . Follow-up    67yr f/u for OSA. States his breathing has been ok since last visit. Was in the ED in September 2020 for SOB but believes it was a panic attack.     Referring provider: Binnie Rail, MD  HPI:  78 year old male former smoker followed in our office for severe obstructive sleep apnea  PMH: Hyperlipidemia, anxiety, hypertension, A. fib, obesity, prediabetes, chronic systolic heart failure Smoker/ Smoking History: Former smoker.  Quit 1979.  16-pack-year smoking history Maintenance: None Pt of: Dr. Elsworth Soho  11/25/2018  - Visit   78 year old male former smoker followed in our office for severe obstructive sleep apnea.  Patient completed a CPAP titration study in August/2020.  Patient was transitioned to a BiPAP at that time.  Patient is completing a follow-up visit to see how he is tolerating the BiPAP.  Patient reports he continues to have difficulties with his mask.  He is currently using the mask that was provided for him at the CPAP titration.  He feels that he still having a persistent amount of leaks.  This is also confirmed on the BiPAP compliance report.  See compliance report listed below:  10/26/2018-11/24/2018-30 had a last 30 days use, 29 of those days greater than 4 hours, average usage 9 hours and 12 minutes, IPAP 25, EPAP 12, pressure support 4, AHI 36.3, central apneas 16.7, obstructive 9.3, unknown 10.1 >>> Persistent leaks seen on compliance report     Tests:   EF 40-45%, failed cardioversion in dec'11   PSG 04/06/10 (wt 200) showed severe obstructive sleep apnea with AHI 66/h, nadir desatn 75% corrrected by CPAP 10 cm to AHI 4.5/h. Higher pressures were associated with emergence of central events.   09/25/2018 - CPAP titration IMPRESSIONS  - An optimal biPAP pressure was selected for this patient ( 26  /22 cm of water)  - Central  sleep apnea was noted during this titration (CAI =  4.6/h).  - Moderete oxygen desaturations were observed during this  titration (min O2 = 85.0%).  - No snoring was audible during this study.  - 2-lead EKG demonstrated: PVCs  - Clinically significant periodic limb movements were not noted  during this study. Arousals associated with PLMs were rare.   Per titration study, change him to  Auto bipap EPAP min 12 , IPAP max 26, PS +4 cm  FENO:  No results found for: NITRICOXIDE  PFT: No flowsheet data found.  WALK:  No flowsheet data found.  Imaging: No results found.  Lab Results:  CBC    Component Value Date/Time   WBC 8.0 10/25/2018 2149   RBC 4.57 10/25/2018 2149   HGB 14.8 10/25/2018 2149   HCT 45.6 10/25/2018 2149   PLT 185 10/25/2018 2149   MCV 99.8 10/25/2018 2149   MCV 93.7 03/25/2016 1619   MCH 32.4 10/25/2018 2149   MCHC 32.5 10/25/2018 2149   RDW 15.1 10/25/2018 2149   LYMPHSABS 2.1 10/25/2018 2149   MONOABS 0.6 10/25/2018 2149   EOSABS 0.1 10/25/2018 2149   BASOSABS 0.0 10/25/2018 2149    BMET    Component Value Date/Time   NA 140 10/25/2018 2149   K 4.0 10/25/2018 2149   CL 106 10/25/2018 2149   CO2 24 10/25/2018 2149   GLUCOSE 109 (H) 10/25/2018 2149   BUN 21 10/25/2018 2149  CREATININE 1.14 10/25/2018 2149   CALCIUM 9.2 10/25/2018 2149   GFRNONAA >60 10/25/2018 2149   GFRAA >60 10/25/2018 2149    BNP    Component Value Date/Time   BNP 476.3 (H) 10/25/2018 2149    ProBNP    Component Value Date/Time   PROBNP 599.0 (H) 11/01/2018 1338    Specialty Problems      Pulmonary Problems   Asthma           OSA (obstructive sleep apnea)    PSG 04/06/10 (wt 200) showed severe obstructive sleep apnea with AHI 66/h, nadir desatn 75% corrrected by CPAP 10 cm to AHI 4.5/h. Higher pressures were associated with emergence of central events. Download on 10 cm 3/14 - residual AHi 15/h  09/25/2018 - CPAP titration IMPRESSIONS  - An optimal  biPAP pressure was selected for this patient ( 26  /22 cm of water)  - Central sleep apnea was noted during this titration (CAI =  4.6/h).  - Moderete oxygen desaturations were observed during this  titration (min O2 = 85.0%).  - No snoring was audible during this study.  - 2-lead EKG demonstrated: PVCs  - Clinically significant periodic limb movements were not noted  during this study. Arousals associated with PLMs were rare.   Per titration study, change him to  Auto bipap EPAP min 12 , IPAP max 26, PS +4 cm       SOB (shortness of breath)      No Known Allergies  Immunization History  Administered Date(s) Administered  . Fluad Quad(high Dose 65+) 10/17/2018  . Influenza Split 09/30/2013  . Influenza Whole 10/21/2007, 10/31/2010, 10/31/2011  . Influenza, High Dose Seasonal PF 11/12/2013, 11/24/2014, 11/18/2015, 11/23/2016  . Influenza-Unspecified 09/30/2012, 11/24/2014, 11/18/2015, 11/08/2017  . Pneumococcal Conjugate-13 11/12/2013, 03/05/2015  . Pneumococcal Polysaccharide-23 11/23/2016  . Pneumococcal-Unspecified 01/21/2010  . Tdap 12/03/2015, 12/23/2016  . Zoster 03/25/2007    Past Medical History:  Diagnosis Date  . A-fib ([place])   . Anxiety   . Atrial fibrillation ([place])   . CHF (congestive heart failure) ([place])   . Claustrophobia    Occasionally when flying   . Colitis   . Diabetes mellitus, type 2 ([place])   . Fracture of one rib, left side, initial encounter for closed fracture 12/27/2016   Occurred 12/21/16 after a fall at home.  Left anterior seventh rib  . Gout   . Hemorrhoids   . Hyperlipidemia   . Hypertension   . LV dysfunction    EF 40-45%  . OSA (obstructive sleep apnea)    CPAP machine   . PVC's (premature ventricular contractions)     Tobacco History: Social History   Tobacco Use  Smoking Status Former Smoker  . Packs/day: 1.00  . Years: 16.00  . Pack years: 16.00  . Types: Cigarettes  . Quit date: 06/30/1977  . Years since quitting:  41.4  Smokeless Tobacco Never Used   Counseling given: Yes   Continue to not smoke  Outpatient Encounter Medications as of 11/25/2018  Medication Sig  . amLODipine (NORVASC) 5 MG tablet TAKE ONE AND ONE-HALF TABLETS DAILY (NEED TO SCHEDULE APPOINTMENT) (Patient taking differently: Take 5 mg by mouth daily. )  . atorvastatin (LIPITOR) 10 MG tablet TAKE 1 TABLET DAILY (Patient taking differently: Take 10 mg by mouth daily. )  . benazepril (LOTENSIN) 40 MG tablet TAKE 1 TABLET DAILY (Patient taking differently: Take 40 mg by mouth daily. )  . dabigatran (PRADAXA) 150 MG CAPS capsule TAKE  1 CAPSULE EVERY 12 HOURS (Patient taking differently: Take 150 mg by mouth every 12 (twelve) hours. )  . furosemide (LASIX) 40 MG tablet Take 1 tablet (40 mg total) by mouth daily.  Marland Kitchen glucose blood (ONE TOUCH ULTRA TEST) test strip Check blood sugar daily prn as directed (Patient taking differently: Check blood sugar as directed)  . LUMIGAN 0.01 % SOLN Place 1 drop into both eyes at bedtime.   . metoprolol tartrate (LOPRESSOR) 25 MG tablet TAKE 2 TABLETS TWICE A DAY (NEED OFFICE VISIT) (SCHEDULE AN APPOINTMENT FOR FUTURE REFILLS) (Patient taking differently: Take 50 mg by mouth 2 (two) times daily. TAKE 2 TABLETS TWICE A DAY (NEED OFFICE VISIT) (SCHEDULE AN APPOINTMENT FOR FUTURE REFILLS))  . Multiple Vitamin (MULTIVITAMIN) tablet Take 1 tablet by mouth daily.    [name] DELICA LANCETS [id number] MISC 1 each by Other route daily. Check blood sugar daily as directed (Patient taking differently: 1 each by Other route daily. Check blood sugar PRN as directed)  . PARoxetine (PAXIL) 20 MG tablet TAKE ONE-HALF (1/2) TABLET DAILY   No facility-administered encounter medications on file as of 11/25/2018.      Review of Systems  Review of Systems  Constitutional: Negative for activity change, chills, fatigue, fever and unexpected weight change.  HENT: Negative for congestion, postnasal drip, rhinorrhea, sinus pressure,  sinus pain and sore throat.   Eyes: Negative.   Respiratory: Negative for cough, shortness of breath and wheezing.   Cardiovascular: Negative for chest pain and palpitations.  Endocrine: Negative.   Genitourinary: Negative.   Musculoskeletal: Negative.   Skin: Negative.   Neurological: Negative for dizziness and headaches.  Psychiatric/Behavioral: Negative.  Negative for dysphoric mood. The patient is not nervous/anxious.   All other systems reviewed and are negative.    Physical Exam  BP 130/80 (BP Location: Left Arm, Patient Position: Sitting, Cuff Size: Normal)   Pulse (!) 55   Temp (!) 97.4 F (36.3 C) (Temporal)   Ht 5\' 7"  (1.702 m)   Wt 225 lb 6.4 oz (102.2 kg)   SpO2 97%   BMI 35.30 kg/m   Wt Readings from Last 5 Encounters:  11/25/18 225 lb 6.4 oz (102.2 kg)  11/01/18 209 lb (94.8 kg)  10/17/18 227 lb (103 kg)  09/25/18 220 lb (99.8 kg)  07/12/18 215 lb (97.5 kg)    BMI Readings from Last 5 Encounters:  11/25/18 35.30 kg/m  11/01/18 32.73 kg/m  10/17/18 35.55 kg/m  09/25/18 33.45 kg/m  07/12/18 33.67 kg/m     Physical Exam Vitals signs and nursing note reviewed.  Constitutional:      General: He is not in acute distress.    Appearance: Normal appearance. He is obese.  HENT:     Head: Normocephalic and atraumatic.     Right Ear: Hearing, tympanic membrane, ear canal and external ear normal. There is no impacted cerumen.     Left Ear: Hearing, tympanic membrane, ear canal and external ear normal. There is no impacted cerumen.     Nose: Nose normal. No mucosal edema.     Right Turbinates: Not enlarged.     Left Turbinates: Not enlarged.     Mouth/Throat:     Mouth: Mucous membranes are dry.     Pharynx: Oropharynx is clear. No oropharyngeal exudate.  Eyes:     Pupils: Pupils are equal, round, and reactive to light.  Neck:     Musculoskeletal: Normal range of motion.  Cardiovascular:     Rate  and Rhythm: Normal rate. Rhythm regularly irregular.      Pulses: Normal pulses.          Radial pulses are 2+ on the right side and 2+ on the left side.     Heart sounds: Normal heart sounds. No murmur.  Pulmonary:     Effort: Pulmonary effort is normal.     Breath sounds: Normal breath sounds. No decreased breath sounds, wheezing or rales.  Musculoskeletal:     Right lower leg: No edema.     Left lower leg: No edema.  Lymphadenopathy:     Cervical: No cervical adenopathy.  Skin:    General: Skin is warm and dry.     Capillary Refill: Capillary refill takes less than 2 seconds.     Findings: No erythema or rash.  Neurological:     General: No focal deficit present.     Mental Status: He is alert and oriented to person, place, and time.     Motor: No weakness.     Coordination: Coordination normal.     Gait: Gait is intact. Gait normal.  Psychiatric:        Mood and Affect: Mood normal.        Behavior: Behavior normal. Behavior is cooperative.        Thought Content: Thought content normal.        Judgment: Judgment normal.      Assessment & Plan:   OSA (obstructive sleep apnea) Uncontrolled AHI Increased central apneas Persistent leaks with mask  Plan: Continue BiPAP therapy at this time Present to your DME company for a mask fitting Will adjust IPAP pressures from 25 >> 17 to hopefully help with central apneas as well as AHI Follow-up with our office in 4 to 6 weeks    Return in about 6 weeks (around 01/06/2019), or if symptoms worsen or fail to improve, for Follow up with Dr. Elsworth Soho.   Lauraine Rinne, NP 11/25/2018   This appointment was 28 minutes long with over 50% of the time in direct face-to-face patient care, assessment, plan of care, and follow-up.

## 2018-11-25 NOTE — Patient Instructions (Addendum)
You were seen today by Lauraine Rinne, NP  for:   1. OSA (obstructive sleep apnea)  New BiPAP setting orders: Max IPAP 18 Minimum EPAP 12 Pressure support 4 4-week download to our office Patient needs a mask fitting  We recommend that you continue using your BIPAP daily >>>Keep up the hard work using your device >>> Goal should be wearing this for the entire night that you are sleeping, at least 4 to 6 hours  Remember:  . Do not drive or operate heavy machinery if tired or drowsy.  . Please notify the supply company and office if you are unable to use your device regularly due to missing supplies or machine being broken.  . Work on maintaining a healthy weight and following your recommended nutrition plan  . Maintain proper daily exercise and movement  . Maintaining proper use of your device can also help improve management of other chronic illnesses such as: Blood pressure, blood sugars, and weight management.   BiPAP/ CPAP Cleaning:  >>>Clean weekly, with Dawn soap, and bottle brush.  Set up to air dry.   Follow Up:    Return in about 6 weeks (around 01/06/2019), or if symptoms worsen or fail to improve, for Follow up with Dr. Elsworth Soho.   Please do your part to reduce the spread of COVID-19:      Reduce your risk of any infection  and COVID19 by using the similar precautions used for avoiding the common cold or flu:  Marland Kitchen Wash your hands often with soap and warm water for at least 20 seconds.  If soap and water are not readily available, use an alcohol-based hand sanitizer with at least 60% alcohol.  . If coughing or sneezing, cover your mouth and nose by coughing or sneezing into the elbow areas of your shirt or coat, into a tissue or into your sleeve (not your hands). Langley Gauss A MASK when in public  . Avoid shaking hands with others and consider head nods or verbal greetings only. . Avoid touching your eyes, nose, or mouth with unwashed hands.  . Avoid close contact with people who  are sick. . Avoid places or events with large numbers of people in one location, like concerts or sporting events. . If you have some symptoms but not all symptoms, continue to monitor at home and seek medical attention if your symptoms worsen. . If you are having a medical emergency, call 911.   [employer] / e-Visit: [email or web address]         [facility]: [facility]: [id number]                   [facility]: [id number]     It is flu season:   >>> Best ways to protect herself from the flu: Receive the yearly flu vaccine, practice good hand hygiene washing with soap and also using hand sanitizer when available, eat a nutritious meals, get adequate rest, hydrate appropriately   Please contact the office if your symptoms worsen or you have concerns that you are not improving.   Thank you for choosing [facility] for your healthcare, and for allowing [place] to partner with you on your healthcare journey. I am thankful to be able to provide care to you today.   Wyn Quaker FNP-C

## 2019-01-06 ENCOUNTER — Encounter: Payer: Self-pay | Admitting: Pulmonary Disease

## 2019-01-06 ENCOUNTER — Other Ambulatory Visit: Payer: Self-pay | Admitting: General Surgery

## 2019-01-06 ENCOUNTER — Other Ambulatory Visit: Payer: Self-pay

## 2019-01-06 ENCOUNTER — Ambulatory Visit (INDEPENDENT_AMBULATORY_CARE_PROVIDER_SITE_OTHER): Payer: Medicare Other | Admitting: Pulmonary Disease

## 2019-01-06 VITALS — BP 144/82 | HR 66 | Temp 97.1°F | Ht 67.0 in | Wt 229.8 lb

## 2019-01-06 DIAGNOSIS — I5022 Chronic systolic (congestive) heart failure: Secondary | ICD-10-CM | POA: Diagnosis not present

## 2019-01-06 DIAGNOSIS — G4733 Obstructive sleep apnea (adult) (pediatric): Secondary | ICD-10-CM | POA: Diagnosis not present

## 2019-01-06 NOTE — Progress Notes (Signed)
   Subjective:    Patient ID: Samuel Moyer., male    DOB: [birth year], 78 y.o.   MRN: [id number]  HPI  78 year old man with severe OSA and treatment emergent central apneas.    PMH -atrial fibrillation and chronic systolic heart failure  On auto CPAP settings 10 to 18 cm,  residual AHI of 32/hour  After repeating CPAP titration study we found that he needed high levels of auto BiPAP.  He was started on auto BiPAP but seem to have significant residual AHI including centrals on his last download on his last visit. On his follow-up visit today, he again denies any somnolence.  He has mild pedal edema and is compliant with Lasix.  He denies proximal nocturnal dyspnea or orthopnea.  He missed his cardiology appointment with Dr. Martinique earlier in the year and has not seen them for a while. I reviewed his echo from 2011 .  His atrial fibrillation is rate controlled and remains on Pradaxa  He has settled down with a full facemask.  Denies any problems with pressure. Auto BiPAP download report was reviewed which shows residual AHI of 39/hour with centrals 20/hour and remainder obstructive events with large leak, average pressure of 22/18 centimeters  Flu shot is up-to-date  Significant tests/ events reviewed  12/2009 EF 40-45%, failed cardioversion in dec'11   PSG 04/06/10 (wt 200) showed severe obstructive sleep apnea with AHI 66/h, nadir desatn 75% corrrected by CPAP 10 cm to AHI 4.5/h. Higher pressures were associated with emergence of central events.   09/25/2018 - CPAP titration >>  26/22 cm auto bipap _Auto bipap EPAP min 12 , IPAP max 26, PS +4 cm   Past Medical History:  Diagnosis Date  . A-fib ([place])   . Anxiety   . Atrial fibrillation ([place])   . CHF (congestive heart failure) ([place])   . Claustrophobia    Occasionally when flying   . Colitis   . Diabetes mellitus, type 2 ([place])   . Fracture of one rib, left side, initial encounter for closed fracture 12/27/2016   Occurred  12/21/16 after a fall at home.  Left anterior seventh rib  . Gout   . Hemorrhoids   . Hyperlipidemia   . Hypertension   . LV dysfunction    EF 40-45%  . OSA (obstructive sleep apnea)    CPAP machine   . PVC's (premature ventricular contractions)     Review of Systems neg for any significant sore throat, dysphagia, itching, sneezing, nasal congestion or excess/ purulent secretions, fever, chills, sweats, unintended wt loss, pleuritic or exertional cp, hempoptysis, orthopnea pnd or change in chronic leg swelling. Also denies presyncope, palpitations, heartburn, abdominal pain, nausea, vomiting, diarrhea or change in bowel or urinary habits, dysuria,hematuria, rash, arthralgias, visual complaints, headache, numbness weakness or ataxia.     Objective:   Physical Exam   Gen. Pleasant, obese, in no distress, normal affect ENT - no pallor,icterus, no post nasal drip, class 2-3 airway Neck: No JVD, no thyromegaly, no carotid bruits Lungs: no use of accessory muscles, no dullness to percussion, decreased without rales or rhonchi  Cardiovascular: Rhythm irregular, heart sounds  normal, no murmurs or gallops, 1+ peripheral edema Abdomen: soft and non-tender, no hepatosplenomegaly, BS normal. Musculoskeletal: No deformities, no cyanosis or clubbing Neuro:  alert, non focal, no tremors        Assessment & Plan:

## 2019-01-06 NOTE — Assessment & Plan Note (Signed)
He has severe treatment emergent central apneas, we have now tried him on BiPAP for 3 months and these have not corrected.  He will need ASV therapy I would like to start him on auto ASV but will need to check EF prior to that He is willing to proceed

## 2019-01-06 NOTE — Assessment & Plan Note (Signed)
He was noted to have EF of 40 to 45% in 2011.  Overall his heart failure symptoms seem to be well controlled he has mild pedal edema denies orthopnea or paroxysmal nocturnal dyspnea. But prior to starting ASV ventilation, will need to check EF again. We will schedule echocardiogram and have him connect with cardiology again

## 2019-01-06 NOTE — Patient Instructions (Signed)
  Schedule echocardiogram If pump function is good then we will go ahead and write you a prescription for ASV machine to replace your BiPAP

## 2019-01-06 NOTE — Addendum Note (Signed)
Addended by: Nena Polio on: 01/06/2019 10:44 AM   Modules accepted: Orders

## 2019-01-09 ENCOUNTER — Other Ambulatory Visit: Payer: Self-pay

## 2019-01-09 ENCOUNTER — Ambulatory Visit (HOSPITAL_COMMUNITY): Payer: Medicare Other | Attending: Cardiology

## 2019-01-09 DIAGNOSIS — I5022 Chronic systolic (congestive) heart failure: Secondary | ICD-10-CM | POA: Insufficient documentation

## 2019-02-19 ENCOUNTER — Ambulatory Visit: Payer: Medicare Other | Attending: Internal Medicine

## 2019-02-19 DIAGNOSIS — Z23 Encounter for immunization: Secondary | ICD-10-CM | POA: Insufficient documentation

## 2019-02-19 NOTE — Progress Notes (Signed)
   [id number]  Name:  Samuel Moyer.    MRN: [id number] DOB: 07-Sep-1940  02/19/2019  [name] was observed post Covid-19 immunization for 15 minutes without incidence. He was provided with Vaccine Information Sheet and instruction to access the V-Safe system.   [name] was instructed to call 911 with any severe reactions post vaccine: Marland Kitchen Difficulty breathing  . Swelling of your face and throat  . A fast heartbeat  . A bad rash all over your body  . Dizziness and weakness    Immunizations Administered    Name Date Dose VIS Date Route   Pfizer COVID-19 Vaccine 02/19/2019  9:07 AM 0.3 mL 01/10/2019 Intramuscular   Manufacturer: [place]   Lot: [id number]   [place]: [id number]

## 2019-02-24 DIAGNOSIS — H401131 Primary open-angle glaucoma, bilateral, mild stage: Secondary | ICD-10-CM | POA: Diagnosis not present

## 2019-02-27 ENCOUNTER — Other Ambulatory Visit: Payer: Self-pay | Admitting: Internal Medicine

## 2019-03-12 ENCOUNTER — Ambulatory Visit: Payer: Medicare Other | Attending: Internal Medicine

## 2019-03-12 DIAGNOSIS — Z23 Encounter for immunization: Secondary | ICD-10-CM | POA: Insufficient documentation

## 2019-03-12 NOTE — Progress Notes (Signed)
   [id number]  Name:  [name].    MRN: [id number] DOB: 12/27/1940  03/12/2019  [name] was observed post Covid-19 immunization for 15 minutes without incidence. He was provided with Vaccine Information Sheet and instruction to access the V-Safe system.   [name] was instructed to call 911 with any severe reactions post vaccine: Marland Kitchen Difficulty breathing  . Swelling of your face and throat  . A fast heartbeat  . A bad rash all over your body  . Dizziness and weakness    Immunizations Administered    Name Date Dose VIS Date Route   Pfizer COVID-19 Vaccine 03/12/2019 11:18 AM 0.3 mL 01/10/2019 Intramuscular   Manufacturer: [employer]   Lot: [id number]   [place]: [id number]

## 2019-03-18 ENCOUNTER — Other Ambulatory Visit: Payer: Self-pay | Admitting: Internal Medicine

## 2019-04-02 ENCOUNTER — Other Ambulatory Visit: Payer: Self-pay

## 2019-04-02 ENCOUNTER — Encounter: Payer: Self-pay | Admitting: Internal Medicine

## 2019-04-02 ENCOUNTER — Ambulatory Visit (INDEPENDENT_AMBULATORY_CARE_PROVIDER_SITE_OTHER): Payer: Medicare Other

## 2019-04-02 ENCOUNTER — Telehealth: Payer: Self-pay | Admitting: Cardiology

## 2019-04-02 ENCOUNTER — Ambulatory Visit (INDEPENDENT_AMBULATORY_CARE_PROVIDER_SITE_OTHER): Payer: Medicare Other | Admitting: Internal Medicine

## 2019-04-02 VITALS — BP 152/86 | HR 58 | Temp 98.3°F | Ht 67.0 in | Wt 225.1 lb

## 2019-04-02 DIAGNOSIS — R0602 Shortness of breath: Secondary | ICD-10-CM

## 2019-04-02 DIAGNOSIS — R0600 Dyspnea, unspecified: Secondary | ICD-10-CM

## 2019-04-02 DIAGNOSIS — R7303 Prediabetes: Secondary | ICD-10-CM | POA: Diagnosis not present

## 2019-04-02 DIAGNOSIS — R0609 Other forms of dyspnea: Secondary | ICD-10-CM

## 2019-04-02 DIAGNOSIS — R059 Cough, unspecified: Secondary | ICD-10-CM | POA: Insufficient documentation

## 2019-04-02 DIAGNOSIS — R05 Cough: Secondary | ICD-10-CM

## 2019-04-02 DIAGNOSIS — I10 Essential (primary) hypertension: Secondary | ICD-10-CM

## 2019-04-02 LAB — COMPREHENSIVE METABOLIC PANEL
ALT: 20 U/L (ref 0–53)
AST: 23 U/L (ref 0–37)
Albumin: 4 g/dL (ref 3.5–5.2)
Alkaline Phosphatase: 71 U/L (ref 39–117)
BUN: 19 mg/dL (ref 6–23)
CO2: 27 mEq/L (ref 19–32)
Calcium: 9.4 mg/dL (ref 8.4–10.5)
Chloride: 105 mEq/L (ref 96–112)
Creatinine, Ser: 1.17 mg/dL (ref 0.40–1.50)
GFR: 60.04 mL/min (ref >60.00)
Glucose, Bld: 115 mg/dL — ABNORMAL HIGH (ref 70–99)
Potassium: 4 mEq/L (ref 3.5–5.1)
Sodium: 139 mEq/L (ref 135–145)
Total Bilirubin: 1.4 mg/dL — ABNORMAL HIGH (ref 0.2–1.2)
Total Protein: 7 g/dL (ref 6.0–8.3)

## 2019-04-02 LAB — CBC WITH DIFFERENTIAL/PLATELET
Basophils Absolute: 0 10*3/uL (ref 0.0–0.1)
Basophils Relative: 0.6 % (ref 0.0–3.0)
Eosinophils Absolute: 0.1 10*3/uL (ref 0.0–0.7)
Eosinophils Relative: 0.8 % (ref 0.0–5.0)
HCT: 39.2 % (ref 39.0–52.0)
Hemoglobin: 13.2 g/dL (ref 13.0–17.0)
Lymphocytes Relative: 12.9 % (ref 12.0–46.0)
Lymphs Abs: 1 10*3/uL (ref 0.7–4.0)
MCHC: 33.6 g/dL (ref 30.0–36.0)
MCV: 98.3 fl (ref 78.0–100.0)
Monocytes Absolute: 0.5 10*3/uL (ref 0.1–1.0)
Monocytes Relative: 6 % (ref 3.0–12.0)
Neutro Abs: 6.2 10*3/uL (ref 1.4–7.7)
Neutrophils Relative %: 79.7 % — ABNORMAL HIGH (ref 43.0–77.0)
Platelets: 207 10*3/uL (ref 150.0–400.0)
RBC: 3.99 Mil/uL — ABNORMAL LOW (ref 4.22–5.81)
RDW: 16.4 % — ABNORMAL HIGH (ref 11.5–15.5)
WBC: 7.7 10*3/uL (ref 4.0–10.5)

## 2019-04-02 LAB — BRAIN NATRIURETIC PEPTIDE: Pro B Natriuretic peptide (BNP): 772 pg/mL — ABNORMAL HIGH (ref 0.0–100.0)

## 2019-04-02 LAB — HEMOGLOBIN A1C: Hgb A1c MFr Bld: 5.7 % (ref 4.6–6.5)

## 2019-04-02 MED ORDER — OMEPRAZOLE 20 MG PO CPDR: 20.0000 mg | DELAYED_RELEASE_CAPSULE | Freq: Every day | ORAL | 1 refills | Status: DC

## 2019-04-02 NOTE — Assessment & Plan Note (Signed)
Acute on chronic He has had some shortness of breath for a while, but seems to be having episodes of shortness of breath that are worse EKG today shows atrial fibrillation with premature ventricular complexes at 69 bpm, right bundle branch block.  Significant change compared to prior EKG Difficult to tell whether his shortness of breath is cardiac, pulmonary, less likely anxiety or GERD related Has follow-up with Dr. Elsworth Soho next week.  Advised that he needs to schedule an appointment with Dr. Martinique CBC, CMP, BNP, chest x-ray today May need extra dose of Lasix-for possible decompensation of heart failure, although his most recent echo improved Further treatment depending on above results

## 2019-04-02 NOTE — Progress Notes (Signed)
Subjective:    Patient ID: Samuel Moyer., male    DOB: 03/30/1940, 79 y.o.   MRN: [id number]  HPI The patient is here for an acute visit.  He is here with is wife.    For the past 3 weeks he has been experiencing intermittent dyspnea on exertion.  His most recent episode was this morning, which occurred when he woke up.  He woke up and he did not feel quite right and he walked to the bathroom and came back to bed.  He woke up his wife because he was feeling short of breath.  Other times it is occurred when he is bringing the garbage cans up the driveway.  His wife states he gets short of breath when he gets stressed or takes a shower.  He wonders if the shortness of breath is anxiety driven.  He does have generalized anxiety.  He states if he stops and concentrates on his breathing-degrees into the nose, Holter 3 seconds, and months ago the symptoms improved.  He has had some episodes that have lasted a couple of days.  He does have episodic coughing that tends to happen mostly around drinking and eating.  The coughing can be prolonged and sometimes he brings up clear sputum.  His family is much more worried about this than he is.  He has occasional heartburn.   He has leg swelling, but it is mild.  He has wheezing at times.  He has not had any tightness in his chest, chest pain or palpitations.  On occasion he will have visual hallucinations.  This is thought to be secondary to his paroxetine, because they did resolve when he stopped the medication.  Unfortunately, this is what works best for his anxiety and we have decided to continue it.  His last hallucination was 2 weeks ago.    Medications and allergies reviewed with patient and updated if appropriate.  Patient Active Problem List   Diagnosis Date Noted  . SOB (shortness of breath) 11/01/2018  . Strain of left quadriceps 02/22/2018  . 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 Acute pain of right knee 12/25/2017  . Fall  12/27/2016  . History of gout 06/03/2015  . Prediabetes 03/05/2015  . Obesity 12/28/2014  . Venous (peripheral) insufficiency 02/16/2014  . OSA (obstructive sleep apnea) 05/24/2010  . ATRIAL FIBRILLATION  01/26/2010  . Chronic systolic heart failure ([place]) 01/26/2010  . Asthma 01/14/2010  . Hypercholesterolemia 10/21/2007  . Anxiety 10/21/2007  . Essential hypertension 02/26/2006  . COLONIC POLYPS, HX OF 02/26/2006    Current Outpatient Medications on File Prior to Visit  Medication Sig Dispense Refill  . amLODipine (NORVASC) 5 MG tablet TAKE ONE AND ONE-HALF TABLETS DAILY (NEED TO SCHEDULE APPOINTMENT) (Patient taking differently: Take 5 mg by mouth daily. ) 135 tablet 3  . atorvastatin (LIPITOR) 10 MG tablet TAKE 1 TABLET DAILY 90 tablet 0  . benazepril (LOTENSIN) 40 MG tablet TAKE 1 TABLET DAILY 90 tablet 0  . dabigatran (PRADAXA) 150 MG CAPS capsule TAKE 1 CAPSULE EVERY 12 HOURS (Patient taking differently: Take 150 mg by mouth every 12 (twelve) hours. ) 180 capsule 3  . furosemide (LASIX) 40 MG tablet Take 1 tablet (40 mg total) by mouth daily. 90 tablet 3  . glucose blood (ONE TOUCH ULTRA TEST) test strip Check blood sugar daily prn as directed (Patient taking differently: Check blood sugar as directed) 100 each 0  . LUMIGAN 0.01 % SOLN Place 1 drop  into both eyes at bedtime.     . metoprolol tartrate (LOPRESSOR) 25 MG tablet TAKE 2 TABLETS TWICE A DAY (NEED OFFICE VISIT) (SCHEDULE AN APPOINTMENT FOR FUTURE REFILLS) (Patient taking differently: Take 50 mg by mouth 2 (two) times daily. TAKE 2 TABLETS TWICE A DAY (NEED OFFICE VISIT) (SCHEDULE AN APPOINTMENT FOR FUTURE REFILLS)) 360 tablet 3  . Multiple Vitamin (MULTIVITAMIN) tablet Take 1 tablet by mouth daily.      [name] DELICA LANCETS [id number] MISC 1 each by Other route daily. Check blood sugar daily as directed (Patient taking differently: 1 each by Other route daily. Check blood sugar PRN as directed) 100 each 0  . PARoxetine  (PAXIL) 20 MG tablet TAKE ONE-HALF (1/2) TABLET DAILY 45 tablet 0   No current facility-administered medications on file prior to visit.    Past Medical History:  Diagnosis Date  . A-fib ([place])   . Anxiety   . Atrial fibrillation ([place])   . CHF (congestive heart failure) ([place])   . Claustrophobia    Occasionally when flying   . Colitis   . Diabetes mellitus, type 2 ([place])   . Fracture of one rib, left side, initial encounter for closed fracture 12/27/2016   Occurred 12/21/16 after a fall at home.  Left anterior seventh rib  . Gout   . Hemorrhoids   . Hyperlipidemia   . Hypertension   . LV dysfunction    EF 40-45%  . OSA (obstructive sleep apnea)    CPAP machine   . PVC's (premature ventricular contractions)     Past Surgical History:  Procedure Laterality Date  . CARDIOVASCULAR STRESS TEST  03/02/2010   EF 50%  . US ECHOCARDIOGRAPHY  11/15/2009   EF 40-45%    Social History   Socioeconomic History  . Marital status: Married    Spouse name: Not on file  . Number of children: 3  . Years of education: Not on file  . Highest education level: Not on file  Occupational History  . Occupation: Retired    Fish farm manager: RETIRED    Comment: [employer]   Tobacco Use  . Smoking status: Former Smoker    Packs/day: 1.00    Years: 16.00    Pack years: 16.00    Types: Cigarettes    Quit date: 06/30/1977    Years since quitting: 41.7  . Smokeless tobacco: Never Used  Substance and Sexual Activity  . Alcohol use: No  . Drug use: No  . Sexual activity: Not on file  Other Topics Concern  . Not on file  Social History Narrative   2 caffeine drinks daily    Social Determinants of Health   Financial Resource Strain:   . Difficulty of Paying Living Expenses: Not on file  Food Insecurity:   . Worried About Charity fundraiser in the Last Year: Not on file  . Ran Out of Food in the Last Year: Not on file  Transportation Needs:   . Lack of Transportation (Medical): Not on file    . Lack of Transportation (Non-Medical): Not on file  Physical Activity:   . Days of Exercise per Week: Not on file  . Minutes of Exercise per Session: Not on file  Stress:   . Feeling of Stress : Not on file  Social Connections:   . Frequency of Communication with Friends and Family: Not on file  . Frequency of Social Gatherings with Friends and Family: Not on file  . Attends Religious Services: Not  on file  . Active Member of Clubs or Organizations: Not on file  . Attends Archivist Meetings: Not on file  . Marital Status: Not on file    Family History  Problem Relation Age of Onset  . Hypertension Father   . Heart attack Father        Age 7 (MI)  . Heart failure Father   . Colonic polyp Sister        and Father  . Stroke Mother   . Colon cancer Neg Hx   . Stomach cancer Neg Hx     Review of Systems  Constitutional: Negative for chills and fever.  HENT: Negative for trouble swallowing.   Respiratory: Positive for cough (with drinking or eating - episodic, coughs up clear phlegm), shortness of breath and wheezing. Negative for chest tightness.   Cardiovascular: Positive for leg swelling (mild). Negative for chest pain and palpitations.  Gastrointestinal:       Occ GERD  Neurological: Negative for light-headedness and headaches.       Objective:   Vitals:   04/02/19 1113  BP: (!) 152/86  Pulse: (!) 58  Temp: 98.3 F (36.8 C)  SpO2: 93%   BP Readings from Last 3 Encounters:  04/02/19 (!) 152/86  01/06/19 (!) 144/82  11/25/18 130/80   Wt Readings from Last 3 Encounters:  04/02/19 225 lb 2 oz (102.1 kg)  01/06/19 229 lb 12.8 oz (104.2 kg)  11/25/18 225 lb 6.4 oz (102.2 kg)   Body mass index is 35.26 kg/m.   Physical Exam    Constitutional: Appears well-developed and well-nourished. No distress.  Head: Normocephalic and atraumatic.  Neck: Neck supple. No tracheal deviation present. No thyromegaly present.  No cervical  lymphadenopathy Cardiovascular: Normal rate, irregular rhythm and normal heart sounds.  No murmur heard. No carotid bruit .  Mild bilateral lower extremity pitting edema Pulmonary/Chest: Effort normal and breath sounds normal. No respiratory distress. No has no wheezes. No rales.  Skin: Skin is warm and dry. Not diaphoretic.  Psychiatric: Normal mood and affect. Behavior is normal.       Assessment & Plan:    See Problem List for Assessment and Plan of chronic medical problems.    This visit occurred during the SARS-CoV-2 public health emergency.  Safety protocols were in place, including screening questions prior to the visit, additional usage of staff PPE, and extensive cleaning of exam room while observing appropriate contact time as indicated for disinfecting solutions.

## 2019-04-02 NOTE — Telephone Encounter (Signed)
Returned call to patient he stated he has been sob for the past several weeks.No chest pain. Stated he saw PCP this morning.He wanted Dr.Jordan to look at EKG and lab work that was done.Stated PCP advised him to see Dr.Jordan Appointment scheduled with Dr.Jordan 3/25 at 4:30 pm.Appointment offered sooner with a PA.Patient only wants to see Dr.Jordan.Advised to call sooner if sob worsens.Advised Dr.Jordan is out of office the rest of week.I will show him EKG and lab work from PCP.

## 2019-04-02 NOTE — Assessment & Plan Note (Signed)
Chronic Check A1c 

## 2019-04-02 NOTE — Patient Instructions (Addendum)
  Blood work was ordered.  Chest xray today.    Medications reviewed and updated.  Changes include :   Start omeprazole 20 mg daily for heartburn  Your prescription(s) have been submitted to your pharmacy. Please take as directed and contact our office if you believe you are having problem(s) with the medication(s).  Schedule a follow up with Dr Martinique.    Please followup as scheduled.

## 2019-04-02 NOTE — Assessment & Plan Note (Signed)
Chronic Blood pressure elevated here today, but given his current symptoms I will not adjust his medication at this time We will check blood work, chest x-ray He has a follow-up on the 18th and we can recheck then and adjust medications if needed CMP

## 2019-04-02 NOTE — Assessment & Plan Note (Signed)
Episodic, often related to eating or drinking Possibly reflux He does have a hiatal hernia We will start omeprazole 20 mg daily to see if that helps Chest x-ray Follow-up 3/18

## 2019-04-02 NOTE — Telephone Encounter (Signed)
Patient calling stating he saw his PCP who told him to make an appointment with Dr. Martinique. He says they would like his medications and EKG to be reviewed by Dr. Martinique. Patient would also like to know if he should be seen sooner than his appointment 4/15. Please advise.

## 2019-04-03 IMAGING — DX DG KNEE COMPLETE 4+V*L*
4 series · 4 of 4 positions shown · non-contrast
Comparison: 02/15/2018

CLINICAL DATA: Fall, pain and swelling

EXAM:
LEFT KNEE - COMPLETE 4+ VIEW

[knee ap]
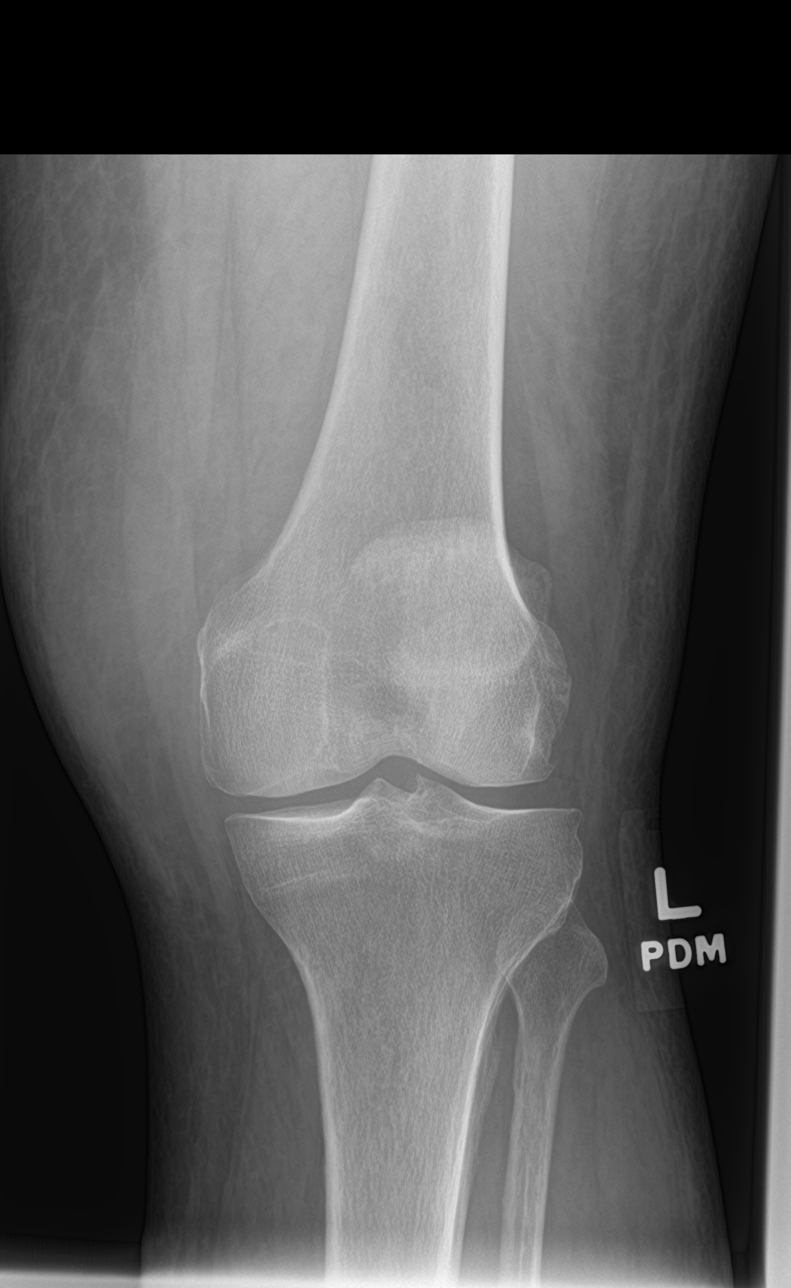

[knee tunnel]
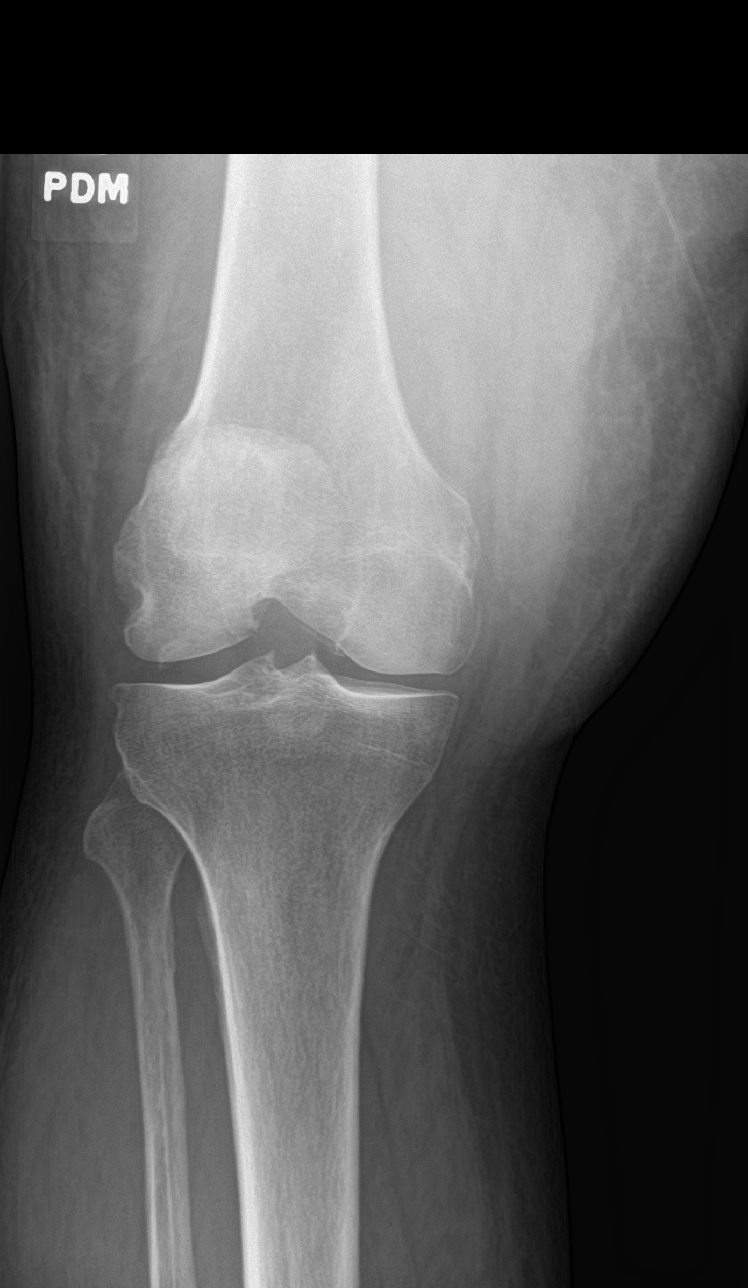

[knee lat]
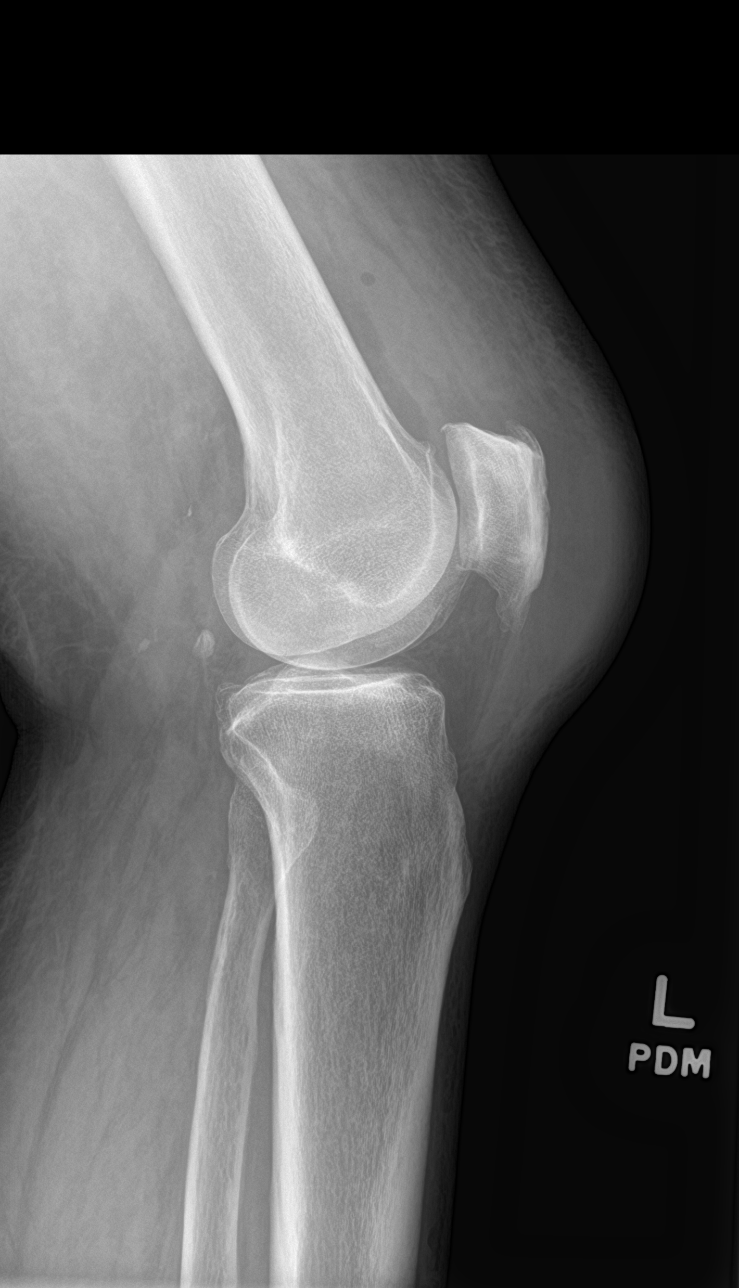

[sunrise]
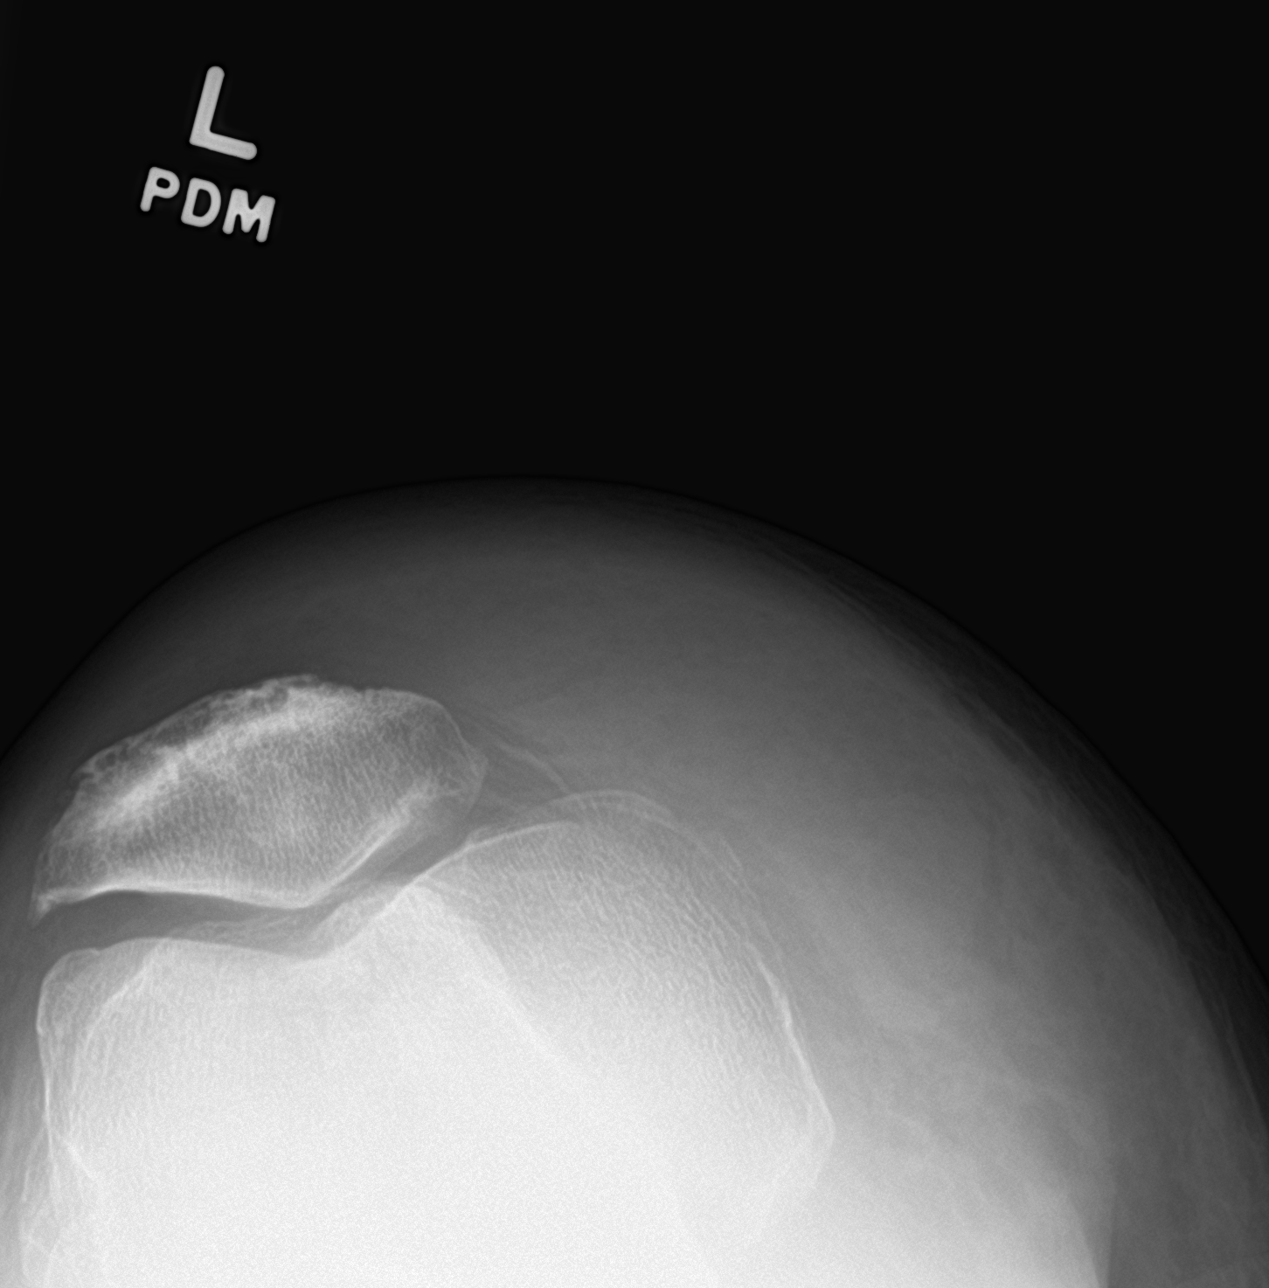

[4 of 4 positions shown; findings below may reference images not displayed]

FINDINGS: Normal alignment and minor left knee degenerative osteoarthritis. No
acute osseous finding or fracture. Anterior patella soft tissue
swelling again noted, similar to the prior study. Small joint
effusion on the lateral view. Patella is located.
IMPRESSION: Anterior knee patellar region soft tissue swelling/injury

Small left knee joint effusion

Minor left knee degenerative osteoarthritis

## 2019-04-07 NOTE — Telephone Encounter (Signed)
Spoke to patient.Dr.Jordan reviewed lab done with PCP.BNP elevated.He advised to increase Lasix and take twice a day for 4 days then return to normal dose.Advised to keep appointment with Dr.Jordan 04/24/19 at 4:30 pm.

## 2019-04-08 ENCOUNTER — Other Ambulatory Visit: Payer: Self-pay

## 2019-04-08 ENCOUNTER — Ambulatory Visit (INDEPENDENT_AMBULATORY_CARE_PROVIDER_SITE_OTHER): Payer: Medicare Other | Admitting: Pulmonary Disease

## 2019-04-08 ENCOUNTER — Encounter: Payer: Self-pay | Admitting: Pulmonary Disease

## 2019-04-08 ENCOUNTER — Telehealth: Payer: Self-pay | Admitting: Pulmonary Disease

## 2019-04-08 DIAGNOSIS — G4731 Primary central sleep apnea: Secondary | ICD-10-CM

## 2019-04-08 DIAGNOSIS — I5022 Chronic systolic (congestive) heart failure: Secondary | ICD-10-CM | POA: Diagnosis not present

## 2019-04-08 NOTE — Assessment & Plan Note (Signed)
Lasix has just been increased his pedal edema has decreased. Repeat echo shows improved LV function to 50 to 55%

## 2019-04-08 NOTE — Progress Notes (Signed)
   Subjective:    Patient ID: Samuel Wielenga., male    DOB: Feb 22, 1940, 79 y.o.   MRN: [id number]  HPI  79 year old man with severe OSA and treatment emergent central apneas.   PMH -atrial fibrillation and chronic systolic heart failure  He had persistent treatment emergent central apneas on his BiPAP downloads .  He reports increasing pedal edema, his Lasix was just increased, reviewed blood work which shows stable creatinine 29/1.2.  He reports NYHA class II symptoms, dyspnea on taking out the trash can. Echo from 01/09/2019 was reviewed which shows improved LVEF to 50 to 55%  He continues to have a large leak on his BiPAP machine, confirmed on review of report.  He also has residual AHI 40/hour with centrals 16/hour and obstructive's 8/hour, great compliance but large leak  He has received Covid vaccination  Significant tests/ events reviewed  12/2009 EF 40-45%, failed cardioversion in dec'11   PSG 04/06/10 (wt 200) showed severe obstructive sleep apnea with AHI 66/h, nadir desatn 75% corrrected by CPAP 10 cm to AHI 4.5/h. Higher pressures were associated with emergence of central events.   09/25/2018 - CPAPtitration >>  26/22 cm auto bipap _Auto bipap EPAP min 12 , IPAP max 26, PS +4 cm  Review of Systems neg for any significant sore throat, dysphagia, itching, sneezing, nasal congestion or excess/ purulent secretions, fever, chills, sweats, unintended wt loss, pleuritic or exertional cp, hempoptysis, orthopnea pnd or change in chronic leg swelling. Also denies presyncope, palpitations, heartburn, abdominal pain, nausea, vomiting, diarrhea or change in bowel or urinary habits, dysuria,hematuria, rash, arthralgias, visual complaints, headache, numbness weakness or ataxia.     Objective:   Physical Exam   Gen. Pleasant, well-nourished, in no distress ENT - no thrush, no pallor/icterus,no post nasal drip Neck: No JVD, no thyromegaly, no carotid bruits Lungs: no use of  accessory muscles, no dullness to percussion, clear without rales or rhonchi  Cardiovascular: Rhythm regular, heart sounds  normal, no murmurs or gallops, 1+ peripheral edema Musculoskeletal: No deformities, no cyanosis or clubbing         Assessment & Plan:

## 2019-04-08 NOTE — Telephone Encounter (Signed)
It looks like he had a titration done in August of 2020 and I can see a report in the chart. Not sure if I totally understand what they are looking for.  Dr. Elsworth Soho, Please advise on the requirements that they are asking for and what we need to do to proceed.

## 2019-04-08 NOTE — Assessment & Plan Note (Signed)
He continues to have significant treatment emergent central apneas with residual AHI of 40/hour about 16/hour of centrals.  He also has a large leak in spite of changing his full facemask. This is causing some residual somnolence and fatigue.  His heart failure seems reasonably well controlled his Lasix has been increased.  This has not been controlled in spite of 6 months of BiPAP trial with high pressure, average BiPAP pressure is 22/18 centimeters with IPAP maximum set at 25 cm. We will change him to ASV machine with auto settings, we checked his echocardiogram and his EF has improved so there are no cardiac risks to using this therapy.  He does have cardiovascular risk in general of untreated OSA  He is very compliant so that is not a problem  advised against medications with sedative side effects Cautioned against driving when sleepy - understanding that sleepiness will vary on a day to day basis

## 2019-04-08 NOTE — Telephone Encounter (Signed)
Response from [employer]:   Elon Alas sent to Harland Dingwall, Melissa  DOB: October 26, 1940   Earnest Bailey, actually this order is to pick up the BIPAP and provide an ASV. Unfortunately, the Rx does not have proper ASV pressures. If Dr. Elsworth Soho wants an Auto ASV, the pressures need to be EPAP Min EPAP Max and PS Min and PS Max.  We will also need to get the raw data documentation for the baseline and titration study done 08/2018 to be sure that the patients central events are >50% of the total respiratory events, which is an insurance requirement. I could not find these in Epic.  Medicare also requires that the patient is titrated to the equipment they will be using, so he will have to have a new titration study done starting with BIPAP, failing and moving to ASV with central events being >50% of the respiratory events.   Melissa, please put a Triage message in.

## 2019-04-08 NOTE — Patient Instructions (Signed)
You continue to have central apneas on your BiPAP machine There is also a large leak  Change your machine to auto ASV

## 2019-04-09 NOTE — Telephone Encounter (Signed)
Spoke with the pt and notified of recs per Dr Elsworth Soho and he agreed to having the BIPAP titration done so study was ordered.

## 2019-04-09 NOTE — Telephone Encounter (Signed)
He seems to have developed new congestive heart failure which is causing increased central events on BiPAP.  Central events are not controlled in spite of high pressures on BiPAP hence the need for ASV.  Please let him know that DME is not willing to provide him with new ASV machine without a repeat sleep study. If he is willing then okay to proceed with BiPAP titration study, add comments-change to ASV if central events are not controlled with BiPAP

## 2019-04-15 ENCOUNTER — Other Ambulatory Visit: Payer: Self-pay | Admitting: Cardiology

## 2019-04-16 NOTE — Progress Notes (Signed)
Subjective:    Patient ID: Samuel Bogard., male    DOB: July 15, 1940, 79 y.o.   MRN: [id number]  HPI The patient is here for follow up of their chronic medical problems, including coughing - ? GERD, DOE, hypertension, CHF  He saw Dr Elsworth Soho and his review of his bipap shows a large leak and emergent central apneas.  He will be changed to ASV machine with auto settings.  He does need to have another sleep study for this to happen.  Dr Martinique increased his lasix after his BNP-40 mg twice daily for 4 days and then return to normal.  He is taking 40 mg daily now.  He did not notice an improvement in his shortness of breath.  His leg swelling did improve a little.  He does not feel that overall his shortness of breath has changed and he actually thinks it is a little worse   This morning he has dry mouth.  He wakes up in the morning and has a lot of mucus and drainage and has to clear all that out.  His mouth tends to be very dry.  He continues to struggle with anxiety.  He is on the Paxil and that has worked well for him, but there is also concern that it does cause hallucinations.  When he went off of this anxiety increased and he had to go back on it.  His last hallucination was a couple of weeks ago.  He had just woken up and told his wife he saw a bumblebee overhead carrying a baby Cardinal.  He looked away look back and it was gone.  His shortness of breath is the most bothersome concern he has.  He will experience shortness of breath with going up stairs.  Typically walking around the house he does okay.  He will experience at rest and he thinks that is often related to his anxiety.  He did not see much improvement in the coughing with the omeprazole.     Medications and allergies reviewed with patient and updated if appropriate.  Patient Active Problem List   Diagnosis Date Noted  . Coughing 04/02/2019  . Dyspnea on exertion 11/01/2018  . Strain of left quadriceps 02/22/2018  .  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 Acute pain of right knee 12/25/2017  . Fall 12/27/2016  . History of gout 06/03/2015  . Prediabetes 03/05/2015  . Obesity 12/28/2014  . Venous (peripheral) insufficiency 02/16/2014  . 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 ATRIAL FIBRILLATION  01/26/2010  . Chronic systolic heart failure ([place]) 01/26/2010  . Asthma 01/14/2010  . Hypercholesterolemia 10/21/2007  . Anxiety 10/21/2007  . Essential hypertension 02/26/2006  . COLONIC POLYPS, HX OF 02/26/2006    Current Outpatient Medications on File Prior to Visit  Medication Sig Dispense Refill  . amLODipine (NORVASC) 5 MG tablet TAKE ONE AND ONE-HALF TABLETS DAILY (NEED TO SCHEDULE APPOINTMENT) 135 tablet 3  . atorvastatin (LIPITOR) 10 MG tablet TAKE 1 TABLET DAILY 90 tablet 0  . benazepril (LOTENSIN) 40 MG tablet TAKE 1 TABLET DAILY 90 tablet 0  . dabigatran (PRADAXA) 150 MG CAPS capsule TAKE 1 CAPSULE EVERY 12 HOURS 180 capsule 3  . furosemide (LASIX) 40 MG tablet Take 1 tablet (40 mg total) by mouth daily. 90 tablet 3  . LUMIGAN 0.01 % SOLN Place 1 drop into both eyes at bedtime.     . metoprolol tartrate (LOPRESSOR) 25 MG tablet TAKE 2 TABLETS TWICE A DAY (  NEED OFFICE VISIT) (SCHEDULE AN APPOINTMENT FOR FUTURE REFILLS) (Patient taking differently: Take 50 mg by mouth 2 (two) times daily. TAKE 2 TABLETS TWICE A DAY (NEED OFFICE VISIT) (SCHEDULE AN APPOINTMENT FOR FUTURE REFILLS)) 360 tablet 3  . Multiple Vitamin (MULTIVITAMIN) tablet Take 1 tablet by mouth daily.      Marland Kitchen omeprazole (PRILOSEC) 20 MG capsule Take 1 capsule (20 mg total) by mouth daily. Take 30 minutes prior to a meal 90 capsule 1  . ONETOUCH DELICA LANCETS 99991111 MISC 1 each by Other route daily. Check blood sugar daily as directed (Patient taking differently: 1 each by Other route daily. Check blood sugar PRN as directed) 100 each 0  . PARoxetine (PAXIL) 20 MG tablet TAKE ONE-HALF (1/2) TABLET DAILY 45 tablet 0   No current  facility-administered medications on file prior to visit.    Past Medical History:  Diagnosis Date  . A-fib ([place])   . Anxiety   . Atrial fibrillation ([place])   . CHF (congestive heart failure) ([place])   . Claustrophobia    Occasionally when flying   . Colitis   . Diabetes mellitus, type 2 ([place])   . Fracture of one rib, left side, initial encounter for closed fracture 12/27/2016   Occurred 12/21/16 after a fall at home.  Left anterior seventh rib  . Gout   . Hemorrhoids   . Hyperlipidemia   . Hypertension   . LV dysfunction    EF 40-45%  . OSA (obstructive sleep apnea)    CPAP machine   . PVC's (premature ventricular contractions)     Past Surgical History:  Procedure Laterality Date  . CARDIOVASCULAR STRESS TEST  03/02/2010   EF 50%  . US ECHOCARDIOGRAPHY  11/15/2009   EF 40-45%    Social History   Socioeconomic History  . Marital status: Married    Spouse name: Not on file  . Number of children: 3  . Years of education: Not on file  . Highest education level: Not on file  Occupational History  . Occupation: Retired    Fish farm manager: RETIRED    Comment: [employer]   Tobacco Use  . Smoking status: Former Smoker    Packs/day: 1.00    Years: 16.00    Pack years: 16.00    Types: Cigarettes    Quit date: 06/30/1977    Years since quitting: 41.8  . Smokeless tobacco: Never Used  Substance and Sexual Activity  . Alcohol use: No  . Drug use: No  . Sexual activity: Not on file  Other Topics Concern  . Not on file  Social History Narrative   2 caffeine drinks daily    Social Determinants of Health   Financial Resource Strain:   . Difficulty of Paying Living Expenses:   Food Insecurity:   . Worried About Charity fundraiser in the Last Year:   . Arboriculturist in the Last Year:   Transportation Needs:   . Film/video editor (Medical):   Marland Kitchen Lack of Transportation (Non-Medical):   Physical Activity:   . Days of Exercise per Week:   . Minutes of Exercise per  Session:   Stress:   . Feeling of Stress :   Social Connections:   . Frequency of Communication with Friends and Family:   . Frequency of Social Gatherings with Friends and Family:   . Attends Religious Services:   . Active Member of Clubs or Organizations:   . Attends Archivist Meetings:   .  Marital Status:     Family History  Problem Relation Age of Onset  . Hypertension Father   . Heart attack Father        Age 28 (MI)  . Heart failure Father   . Colonic polyp Sister        and Father  . Stroke Mother   . Colon cancer Neg Hx   . Stomach cancer Neg Hx     Review of Systems  Constitutional: Negative for fever.  Respiratory: Positive for cough, shortness of breath and wheezing.   Cardiovascular: Positive for leg swelling. Negative for chest pain and palpitations.  Psychiatric/Behavioral: The patient is nervous/anxious.        Objective:   Vitals:   04/17/19 1012  BP: (!) 164/80  Pulse: 61  Resp: 20  Temp: 98.1 F (36.7 C)  SpO2: 98%   BP Readings from Last 3 Encounters:  04/17/19 (!) 164/80  04/08/19 138/70  04/02/19 (!) 152/86   Wt Readings from Last 3 Encounters:  04/17/19 225 lb (102.1 kg)  04/08/19 221 lb 9.6 oz (100.5 kg)  04/02/19 225 lb 2 oz (102.1 kg)   Body mass index is 35.24 kg/m.   Physical Exam    Constitutional: Appears well-developed and well-nourished. No distress.  HENT:  Head: Normocephalic and atraumatic.  Neck: Neck supple. No tracheal deviation present. No thyromegaly present.  No cervical lymphadenopathy Cardiovascular: Normal rate, regular rhythm and normal heart sounds.   No murmur heard. No carotid bruit .  Mild bilateral lower extremity edema Pulmonary/Chest: Effort normal and breath sounds normal. No respiratory distress. No has no wheezes. No rales.  Skin: Skin is warm and dry. Not diaphoretic.  Psychiatric: Anxious mood and affect. Behavior is normal.      Assessment & Plan:    See Problem List for  Assessment and Plan of chronic medical problems.    This visit occurred during the SARS-CoV-2 public health emergency.  Safety protocols were in place, including screening questions prior to the visit, additional usage of staff PPE, and extensive cleaning of exam room while observing appropriate contact time as indicated for disinfecting solutions.

## 2019-04-16 NOTE — Patient Instructions (Addendum)
   Medications reviewed and updated.  Changes include :   Start clonazepam twice daily  Your prescription(s) have been submitted to your pharmacy. Please take as directed and contact our office if you believe you are having problem(s) with the medication(s).      Please followup in 6 months

## 2019-04-17 ENCOUNTER — Ambulatory Visit: Payer: Medicare Other

## 2019-04-17 ENCOUNTER — Ambulatory Visit (INDEPENDENT_AMBULATORY_CARE_PROVIDER_SITE_OTHER): Payer: Medicare Other | Admitting: Internal Medicine

## 2019-04-17 ENCOUNTER — Encounter: Payer: Self-pay | Admitting: Internal Medicine

## 2019-04-17 ENCOUNTER — Other Ambulatory Visit: Payer: Self-pay

## 2019-04-17 VITALS — BP 164/80 | HR 61 | Temp 98.1°F | Resp 20 | Ht 67.0 in | Wt 225.0 lb

## 2019-04-17 DIAGNOSIS — R0600 Dyspnea, unspecified: Secondary | ICD-10-CM | POA: Diagnosis not present

## 2019-04-17 DIAGNOSIS — R059 Cough, unspecified: Secondary | ICD-10-CM

## 2019-04-17 DIAGNOSIS — I5022 Chronic systolic (congestive) heart failure: Secondary | ICD-10-CM

## 2019-04-17 DIAGNOSIS — R0609 Other forms of dyspnea: Secondary | ICD-10-CM

## 2019-04-17 DIAGNOSIS — R05 Cough: Secondary | ICD-10-CM

## 2019-04-17 DIAGNOSIS — F419 Anxiety disorder, unspecified: Secondary | ICD-10-CM

## 2019-04-17 DIAGNOSIS — I10 Essential (primary) hypertension: Secondary | ICD-10-CM | POA: Diagnosis not present

## 2019-04-17 MED ORDER — CLONAZEPAM 0.5 MG PO TABS: 0.5000 mg | ORAL_TABLET | Freq: Two times a day (BID) | ORAL | 1 refills | Status: DC

## 2019-04-17 NOTE — Assessment & Plan Note (Signed)
Chronic Blood pressure is elevated here today, but some of that may be related to anxiety He does see cardiology next week so I will not make any changes today except for trying to improve his anxiety

## 2019-04-17 NOTE — Assessment & Plan Note (Signed)
Elevated BNP last month and Lasix was increased to twice daily x4 days-he states his leg edema did improve, but there is no improvement in his shortness of breath and he feels it may be worse Has follow-up with Dr. Martinique next week

## 2019-04-17 NOTE — Assessment & Plan Note (Addendum)
Chronic Anxiety not currently controlled and anxiety level is high This is likely contributing to some of his shortness of breath Continue Paxil, but discussed with him we should revisit this since we think it could be causing some of the hallucinations.  The hallucinations are not bothersome so we will hold off for now Start clonazepam 0.5 mg twice daily-I believe the benefits of this medication outweigh the small risks Him or his wife will update me via MyChart and how the medication helps

## 2019-04-17 NOTE — Assessment & Plan Note (Signed)
Chronic States no improvement after increasing Lasix to twice daily x4 days Question how much of this is heart failure versus pulmonary cause Has appointment with Dr. Martinique next week and upcoming appointment with Dr. Karl Bales His anxiety may be contributing to some degree-we will start clonazepam for the anxiety No change in medications

## 2019-04-17 NOTE — Assessment & Plan Note (Signed)
Does not seem to be a lot of improvement with the omeprazole-advised to continue for now ?  Cardiac or pulmonary cause Has scheduled appointment with cardiology and pulmonary

## 2019-04-21 NOTE — Progress Notes (Signed)
Samuel Moyer. Date of Birth: 02-Feb-1940   History of Present Illness: Samuel Moyer is seen today for followup of CHF and atrial fibrillation. He has a history of permanent atrial fibrillation. He also is a history of congestive heart failure with ejection fraction of 40-45%. Normal myoview in 2011 with EF of 50% at that time. He does have OSA on CPAP and is followed by pulmonary.   In late February he noted increased SOB. Was seen by Dr Quay Burow and BNP was elevated at 772 which was above his baseline of 600. His lasix was increased for a few days. He states he really didn't notice much change in his breathing. Echo showed good EF 50-55%, severe biatrial enlargement in setting of chronic AFib, and moderate pulmonary HTN. The RV was noted to be moderately enlarged with normal systolic function.  He reports he is still SOB with activity such as taking out the trash or clearing brush. Sometimes this makes him feel anxious. Denies increase in edema. No orthopnea or PND. No cough. He is scheduled for repeat sleep study in April. Recent CXR showed NAD.    Current Outpatient Medications on File Prior to Visit  Medication Sig Dispense Refill  . amLODipine (NORVASC) 5 MG tablet TAKE ONE AND ONE-HALF TABLETS DAILY (NEED TO SCHEDULE APPOINTMENT) 135 tablet 3  . atorvastatin (LIPITOR) 10 MG tablet TAKE 1 TABLET DAILY 90 tablet 0  . benazepril (LOTENSIN) 40 MG tablet TAKE 1 TABLET DAILY 90 tablet 0  . clonazePAM (KLONOPIN) 0.5 MG tablet Take 1 tablet (0.5 mg total) by mouth 2 (two) times daily. 60 tablet 1  . dabigatran (PRADAXA) 150 MG CAPS capsule TAKE 1 CAPSULE EVERY 12 HOURS 180 capsule 3  . LUMIGAN 0.01 % SOLN Place 1 drop into both eyes at bedtime.     . metoprolol tartrate (LOPRESSOR) 25 MG tablet TAKE 2 TABLETS TWICE A DAY (NEED OFFICE VISIT) (SCHEDULE AN APPOINTMENT FOR FUTURE REFILLS) (Patient taking differently: Take 50 mg by mouth 2 (two) times daily. TAKE 2 TABLETS TWICE A DAY (NEED OFFICE  VISIT) (SCHEDULE AN APPOINTMENT FOR FUTURE REFILLS)) 360 tablet 3  . Multiple Vitamin (MULTIVITAMIN) tablet Take 1 tablet by mouth daily.      Marland Kitchen omeprazole (PRILOSEC) 20 MG capsule Take 1 capsule (20 mg total) by mouth daily. Take 30 minutes prior to a meal 90 capsule 1  . ONETOUCH DELICA LANCETS 99991111 MISC 1 each by Other route daily. Check blood sugar daily as directed (Patient taking differently: 1 each by Other route daily. Check blood sugar PRN as directed) 100 each 0  . PARoxetine (PAXIL) 20 MG tablet TAKE ONE-HALF (1/2) TABLET DAILY 45 tablet 0   No current facility-administered medications on file prior to visit.    No Known Allergies  Past Medical History:  Diagnosis Date  . A-fib ([place])   . Anxiety   . Atrial fibrillation ([place])   . CHF (congestive heart failure) ([place])   . Claustrophobia    Occasionally when flying   . Colitis   . Diabetes mellitus, type 2 ([place])   . Fracture of one rib, left side, initial encounter for closed fracture 12/27/2016   Occurred 12/21/16 after a fall at home.  Left anterior seventh rib  . Gout   . Hemorrhoids   . Hyperlipidemia   . Hypertension   . LV dysfunction    EF 40-45%  . OSA (obstructive sleep apnea)    CPAP machine   . PVC's (premature ventricular contractions)  Past Surgical History:  Procedure Laterality Date  . CARDIOVASCULAR STRESS TEST  03/02/2010   EF 50%  . US ECHOCARDIOGRAPHY  11/15/2009   EF 40-45%    Social History   Tobacco Use  Smoking Status Former Smoker  . Packs/day: 1.00  . Years: 16.00  . Pack years: 16.00  . Types: Cigarettes  . Quit date: 06/30/1977  . Years since quitting: 41.8  Smokeless Tobacco Never Used    Social History   Substance and Sexual Activity  Alcohol Use No    Family History  Problem Relation Age of Onset  . Hypertension Father   . Heart attack Father        Age 35 (MI)  . Heart failure Father   . Colonic polyp Sister        and Father  . Stroke Mother   . Colon cancer  Neg Hx   . Stomach cancer Neg Hx     Review of Systems: As noted in history of present illness  All other systems were reviewed and are negative.  Physical Exam: BP 140/60   Pulse (!) 56   Temp (!) 96.9 F (36.1 C)   Ht 5\' 7"  (1.702 m)   Wt 223 lb (101.2 kg)   SpO2 96%   BMI 34.93 kg/m  GENERAL:  Well appearing obese WM in NAD HEENT:  PERRL, EOMI, sclera are clear. Oropharynx is clear. NECK:  No jugular venous distention, carotid upstroke brisk and symmetric, no bruits, no thyromegaly or adenopathy LUNGS:  Clear to auscultation bilaterally CHEST:  Unremarkable HEART:  IRRR,  PMI not displaced or sustained,S1 and S2 within normal limits, no S3, no S4: no clicks, no rubs, no murmurs ABD:  Soft, nontender. BS +, no masses or bruits. No hepatomegaly, no splenomegaly EXT:  2 + pulses throughout, 1+ lower extremity edema, no cyanosis no clubbing SKIN:  Warm and dry.  No rashes NEURO:  Alert and oriented x 3. Cranial nerves II through XII intact. PSYCH:  Cognitively intact    LABORATORY DATA: Lab Results  Component Value Date   WBC 7.7 04/02/2019   HGB 13.2 04/02/2019   HCT 39.2 04/02/2019   PLT 207.0 04/02/2019   GLUCOSE 115 (H) 04/02/2019   CHOL 148 10/17/2018   TRIG 65.0 10/17/2018   HDL 56.70 10/17/2018   LDLCALC 79 10/17/2018   ALT 20 04/02/2019   AST 23 04/02/2019   NA 139 04/02/2019   K 4.0 04/02/2019   CL 105 04/02/2019   CREATININE 1.17 04/02/2019   BUN 19 04/02/2019   CO2 27 04/02/2019   TSH 1.30 06/01/2016   PSA 0.63 09/12/2007   HGBA1C 5.7 04/02/2019   MICROALBUR 1.8 06/13/2013    Echo 01/09/19: IMPRESSIONS    1. Technically difficult study. Left ventricular ejection fraction, by  visual estimation, is 50 to 55%. The left ventricle has low normal  function. There is no left ventricular hypertrophy.  2. Left ventricular diastolic parameters are indeterminate.  3. Global right ventricle has normal systolic function.The right  ventricular size is  moderately enlarged.  4. There is mild dilatation of the ascending aorta measuring 36 mm.  5. Right atrial size was severely dilated.  6. Left atrial size was severely dilated.  7. The aortic valve was not well visualized. Aortic valve regurgitation  is mild. No evidence of aortic valve stenosis.  8. The mitral valve is normal in structure. Mild mitral valve  regurgitation.  9. The tricuspid valve is normal in structure.  Tricuspid valve  regurgitation is trivial.  10. The pulmonic valve was not well visualized. Pulmonic valve  regurgitation is not visualized.  11. The inferior vena cava is normal in size with greater than 50%  respiratory variability, suggesting right atrial pressure of 3 mmHg.  12. The tricuspid regurgitant velocity is 3.28 m/s, and with an assumed  right atrial pressure of 3 mmHg, the estimated right ventricular systolic  pressure is moderately elevated at 46.0 mmHg.    Assessment / Plan: 1. Permanent atrial fibrillation. Rate is well controlled.  Continue metoprolol.  He is on chronic anticoagulation with Pradaxa. Renal function and Hgb have been normal.  2. 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 Recent Echo in  December showed EF 50-55% there was some evidence of pulmonary HTN.  Continue ACE inhibitor, beta blocker, and diuretic therapy. I have recommended increasing lasix to 40 mg in the am and 20 mg in the pm.  Encourage sodium restriction. Wear support hose. I suspect his dyspnea is multifactorial with CHF, AFib, OSA, and deconditioning. Follow up with pulmonary for repeat sleep study. I have recommended daily walking to build up conditioning. I will follow up in 3 months- consider repeat BNP then.  3. Hypertension, BP is  controlled.   4. OSA.

## 2019-04-24 ENCOUNTER — Encounter: Payer: Self-pay | Admitting: Cardiology

## 2019-04-24 ENCOUNTER — Ambulatory Visit (INDEPENDENT_AMBULATORY_CARE_PROVIDER_SITE_OTHER): Payer: Medicare Other | Admitting: Cardiology

## 2019-04-24 ENCOUNTER — Other Ambulatory Visit: Payer: Self-pay

## 2019-04-24 VITALS — BP 140/60 | HR 56 | Temp 96.9°F | Ht 67.0 in | Wt 223.0 lb

## 2019-04-24 DIAGNOSIS — I10 Essential (primary) hypertension: Secondary | ICD-10-CM | POA: Diagnosis not present

## 2019-04-24 DIAGNOSIS — I482 Chronic atrial fibrillation, unspecified: Secondary | ICD-10-CM | POA: Diagnosis not present

## 2019-04-24 DIAGNOSIS — G4733 Obstructive sleep apnea (adult) (pediatric): Secondary | ICD-10-CM | POA: Diagnosis not present

## 2019-04-24 DIAGNOSIS — I5042 Chronic combined systolic (congestive) and diastolic (congestive) heart failure: Secondary | ICD-10-CM | POA: Diagnosis not present

## 2019-04-24 MED ORDER — FUROSEMIDE 40 MG PO TABS: ORAL_TABLET | ORAL | 3 refills | Status: DC

## 2019-04-24 NOTE — Patient Instructions (Addendum)
Increase lasix to 40 mg in the morning and 20 mg in the afternoon.   Try to walk daily to build up your conditioning.   We will follow up in 3 months

## 2019-04-28 ENCOUNTER — Other Ambulatory Visit (HOSPITAL_COMMUNITY)
Admission: RE | Admit: 2019-04-28 | Discharge: 2019-04-28 | Disposition: A | Discharge status: Home / Self Care | Payer: Medicare Other | Source: Ambulatory Visit | Attending: Pulmonary Disease | Admitting: Pulmonary Disease

## 2019-04-28 DIAGNOSIS — Z01812 Encounter for preprocedural laboratory examination: Secondary | ICD-10-CM | POA: Diagnosis not present

## 2019-04-28 DIAGNOSIS — Z20822 Contact with and (suspected) exposure to covid-19: Secondary | ICD-10-CM | POA: Insufficient documentation

## 2019-04-28 LAB — SARS CORONAVIRUS 2 (TAT 6-24 HRS): SARS Coronavirus 2: NEGATIVE

## 2019-04-28 NOTE — Progress Notes (Signed)
Subjective:    Patient ID: Samuel Miya., male    DOB: Feb 09, 1940, 79 y.o.   MRN: [id number]  HPI The patient is here for an acute visit.  ? Cellulitis on right elbow and knee.    About 2 weeks ago he noticed that his right elbow started to become sore.  He denies any injuries.  He is unsure if it started during the day or at night.  The elbow became swollen.  He did not have any redness.  His symptoms got worse, but are now getting a little bit better.  Yesterday or the day prior he started to having soreness and swelling in his left knee.  He denies any redness.  He thinks the pain is slightly better today.  He rates the pain as mild-moderate.  He has taken Tylenol.  He denies any injuries.  He denies fevers or chills.  He was concerned about possible infection in the joints.  He does have a history of gout.       Medications and allergies reviewed with patient and updated if appropriate.  Patient Active Problem List   Diagnosis Date Noted  . Coughing 04/02/2019  . Dyspnea on exertion 11/01/2018  . Strain of left quadriceps 02/22/2018  . 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 Acute pain of right knee 12/25/2017  . Fall 12/27/2016  . History of gout 06/03/2015  . Prediabetes 03/05/2015  . Obesity 12/28/2014  . Venous (peripheral) insufficiency 02/16/2014  . 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 ATRIAL FIBRILLATION  01/26/2010  . Chronic systolic heart failure ([place]) 01/26/2010  . Asthma 01/14/2010  . Hypercholesterolemia 10/21/2007  . Anxiety 10/21/2007  . Essential hypertension 02/26/2006  . COLONIC POLYPS, HX OF 02/26/2006    Current Outpatient Medications on File Prior to Visit  Medication Sig Dispense Refill  . amLODipine (NORVASC) 5 MG tablet TAKE ONE AND ONE-HALF TABLETS DAILY (NEED TO SCHEDULE APPOINTMENT) 135 tablet 3  . atorvastatin (LIPITOR) 10 MG tablet TAKE 1 TABLET DAILY 90 tablet 0  . benazepril (LOTENSIN) 40 MG tablet TAKE 1 TABLET DAILY 90  tablet 0  . clonazePAM (KLONOPIN) 0.5 MG tablet Take 1 tablet (0.5 mg total) by mouth 2 (two) times daily. 60 tablet 1  . dabigatran (PRADAXA) 150 MG CAPS capsule TAKE 1 CAPSULE EVERY 12 HOURS 180 capsule 3  . furosemide (LASIX) 40 MG tablet Take 40 mg in the morning and 20 mg in the afternoon 145 tablet 3  . LUMIGAN 0.01 % SOLN Place 1 drop into both eyes at bedtime.     . metoprolol tartrate (LOPRESSOR) 25 MG tablet TAKE 2 TABLETS TWICE A DAY (NEED OFFICE VISIT) (SCHEDULE AN APPOINTMENT FOR FUTURE REFILLS) (Patient taking differently: Take 50 mg by mouth 2 (two) times daily. TAKE 2 TABLETS TWICE A DAY (NEED OFFICE VISIT) (SCHEDULE AN APPOINTMENT FOR FUTURE REFILLS)) 360 tablet 3  . Multiple Vitamin (MULTIVITAMIN) tablet Take 1 tablet by mouth daily.      Marland Kitchen omeprazole (PRILOSEC) 20 MG capsule Take 1 capsule (20 mg total) by mouth daily. Take 30 minutes prior to a meal 90 capsule 1  . ONETOUCH DELICA LANCETS 99991111 MISC 1 each by Other route daily. Check blood sugar daily as directed (Patient taking differently: 1 each by Other route daily. Check blood sugar PRN as directed) 100 each 0  . PARoxetine (PAXIL) 20 MG tablet TAKE ONE-HALF (1/2) TABLET DAILY 45 tablet 0   No current facility-administered medications on file prior to  visit.    Past Medical History:  Diagnosis Date  . A-fib ([place])   . Anxiety   . Atrial fibrillation ([place])   . CHF (congestive heart failure) ([place])   . Claustrophobia    Occasionally when flying   . Colitis   . Diabetes mellitus, type 2 ([place])   . Fracture of one rib, left side, initial encounter for closed fracture 12/27/2016   Occurred 12/21/16 after a fall at home.  Left anterior seventh rib  . Gout   . Hemorrhoids   . Hyperlipidemia   . Hypertension   . LV dysfunction    EF 40-45%  . OSA (obstructive sleep apnea)    CPAP machine   . PVC's (premature ventricular contractions)     Past Surgical History:  Procedure Laterality Date  . CARDIOVASCULAR STRESS TEST   03/02/2010   EF 50%  . US ECHOCARDIOGRAPHY  11/15/2009   EF 40-45%    Social History   Socioeconomic History  . Marital status: Married    Spouse name: Not on file  . Number of children: 3  . Years of education: Not on file  . Highest education level: Not on file  Occupational History  . Occupation: Retired    Fish farm manager: RETIRED    Comment: [employer]   Tobacco Use  . Smoking status: Former Smoker    Packs/day: 1.00    Years: 16.00    Pack years: 16.00    Types: Cigarettes    Quit date: 06/30/1977    Years since quitting: 41.8  . Smokeless tobacco: Never Used  Substance and Sexual Activity  . Alcohol use: No  . Drug use: No  . Sexual activity: Not on file  Other Topics Concern  . Not on file  Social History Narrative   2 caffeine drinks daily    Social Determinants of Health   Financial Resource Strain:   . Difficulty of Paying Living Expenses:   Food Insecurity:   . Worried About Charity fundraiser in the Last Year:   . Arboriculturist in the Last Year:   Transportation Needs:   . Film/video editor (Medical):   Marland Kitchen Lack of Transportation (Non-Medical):   Physical Activity:   . Days of Exercise per Week:   . Minutes of Exercise per Session:   Stress:   . Feeling of Stress :   Social Connections:   . Frequency of Communication with Friends and Family:   . Frequency of Social Gatherings with Friends and Family:   . Attends Religious Services:   . Active Member of Clubs or Organizations:   . Attends Archivist Meetings:   Marland Kitchen Marital Status:     Family History  Problem Relation Age of Onset  . Hypertension Father   . Heart attack Father        Age 73 (MI)  . Heart failure Father   . Colonic polyp Sister        and Father  . Stroke Mother   . Colon cancer Neg Hx   . Stomach cancer Neg Hx     Review of Systems  Constitutional: Negative for chills and fever.  Musculoskeletal: Positive for arthralgias and joint swelling.  Skin: Negative  for color change and wound.       Objective:   Vitals:   04/29/19 0920  BP: (!) 144/78  Pulse: 77  Resp: 16  Temp: 98.6 F (37 C)  SpO2: 94%   BP Readings from Last  3 Encounters:  04/29/19 (!) 144/78  04/24/19 140/60  04/17/19 (!) 164/80   Wt Readings from Last 3 Encounters:  04/29/19 222 lb 6.4 oz (100.9 kg)  04/24/19 223 lb (101.2 kg)  04/17/19 225 lb (102.1 kg)   Body mass index is 34.83 kg/m.   Physical Exam Constitutional:      General: He is not in acute distress.    Appearance: Normal appearance. He is not ill-appearing.  HENT:     Head: Normocephalic and atraumatic.  Musculoskeletal:     Right lower leg: Edema (1+ pitting edema) present.     Left lower leg: Edema (1+ pitting edema) present.     Comments: Right elbow swelling and tenderness to palpation.  Increased pain with active or passive movement of the elbow joint.  Left knee with slight effusion, no significant pain with palpation of knee.  Pain with movement of the knee.  Both joints slightly warm to palpation  Skin:    General: Skin is warm and dry.     Findings: No erythema (No erythema in the right elbow or left knee) or lesion.  Neurological:     Mental Status: He is alert.            Assessment & Plan:    See Problem List for Assessment and Plan of chronic medical problems.    This visit occurred during the SARS-CoV-2 public health emergency.  Safety protocols were in place, including screening questions prior to the visit, additional usage of staff PPE, and extensive cleaning of exam room while observing appropriate contact time as indicated for disinfecting solutions.

## 2019-04-29 ENCOUNTER — Other Ambulatory Visit: Payer: Self-pay

## 2019-04-29 ENCOUNTER — Telehealth (HOSPITAL_COMMUNITY): Payer: Self-pay | Admitting: *Deleted

## 2019-04-29 ENCOUNTER — Encounter: Payer: Self-pay | Admitting: Internal Medicine

## 2019-04-29 ENCOUNTER — Ambulatory Visit (INDEPENDENT_AMBULATORY_CARE_PROVIDER_SITE_OTHER): Payer: Medicare Other | Admitting: Internal Medicine

## 2019-04-29 DIAGNOSIS — T560X1A Toxic effect of lead and its compounds, accidental (unintentional), initial encounter: Secondary | ICD-10-CM | POA: Diagnosis not present

## 2019-04-29 DIAGNOSIS — M10171 Lead-induced gout, right ankle and foot: Secondary | ICD-10-CM | POA: Diagnosis not present

## 2019-04-29 MED ORDER — PREDNISONE 10 MG PO TABS: ORAL_TABLET | ORAL | 0 refills | Status: DC

## 2019-04-29 NOTE — Assessment & Plan Note (Addendum)
Acute Right elbow and left knee No symptoms suggestive of cellulitis Prednisone taper Discussed that we will check his uric acid level next time we do blood work-he may benefit from taking allopurinol on a daily basis Advised him to call if there is no improvement after the prednisone taper or if he has any questions

## 2019-04-29 NOTE — Telephone Encounter (Signed)
Received message from Pt wife Samuel Moyer.  Called to find out about cardiac rehab and whether he husband could qualify. In review of pt medical history he does not have a medicare approved diagnosis for cardiac rehab.  He could however qualify for pulmonary rehab with the diagnosis of CHF.  No criteria for EF with pulmonary rehab.   Pt wife reports that pt has increasing shortness of breath and very little endurance for walking.  Per medical progress notes, pt is very deconditioned.  Pt has doubled his diuretic medication and she denies any sudden weight gain. Seen today with complaints of gout weight stable. Medications ordered and sylvia is on her way back home from the pharmacy.  Pt has upcoming sleep study with CPAP device this Thursday. Pt wife requested call back after he completes this test.  Suggested that her husband may benefit from home PT to increase his exercise tolerance so that he could participate in a group outpatient setting.  Will follow up with pt and his wife next week. Cherre Huger, BSN Cardiac and Training and development officer

## 2019-04-29 NOTE — Patient Instructions (Addendum)
Medications reviewed and updated.  Changes include :   Prednisone taper  Your prescription(s) have been submitted to your pharmacy. Please take as directed and contact our office if you believe you are having problem(s) with the medication(s).  Please call if there is no improvement in your symptoms.       Gout  Gout is a condition that causes painful swelling of the joints. Gout is a type of inflammation of the joints (arthritis). This condition is caused by having too much uric acid in the body. Uric acid is a chemical that forms when the body breaks down substances called purines. Purines are important for building body proteins. When the body has too much uric acid, sharp crystals can form and build up inside the joints. This causes pain and swelling. Gout attacks can happen quickly and may be very painful (acute gout). Over time, the attacks can affect more joints and become more frequent (chronic gout). Gout can also cause uric acid to build up under the skin and inside the kidneys. What are the causes? This condition is caused by too much uric acid in your blood. This can happen because:  Your kidneys do not remove enough uric acid from your blood. This is the most common cause.  Your body makes too much uric acid. This can happen with some cancers and cancer treatments. It can also occur if your body is breaking down too many red blood cells (hemolytic anemia).  You eat too many foods that are high in purines. These foods include organ meats and some seafood. Alcohol, especially beer, is also high in purines. A gout attack may be triggered by trauma or stress. What increases the risk? You are more likely to develop this condition if you:  Have a family history of gout.  Are male and middle-aged.  Are male and have gone through menopause.  Are obese.  Frequently drink alcohol, especially beer.  Are dehydrated.  Lose weight too quickly.  Have an organ  transplant.  Have lead poisoning.  Take certain medicines, including aspirin, cyclosporine, diuretics, levodopa, and niacin.  Have kidney disease.  Have a skin condition called psoriasis. What are the signs or symptoms? An attack of acute gout happens quickly. It usually occurs in just one joint. The most common place is the big toe. Attacks often start at night. Other joints that may be affected include joints of the feet, ankle, knee, fingers, wrist, or elbow. Symptoms of this condition may include:  Severe pain.  Warmth.  Swelling.  Stiffness.  Tenderness. The affected joint may be very painful to touch.  Shiny, red, or purple skin.  Chills and fever. Chronic gout may cause symptoms more frequently. More joints may be involved. You may also have white or yellow lumps (tophi) on your hands or feet or in other areas near your joints. How is this diagnosed? This condition is diagnosed based on your symptoms, medical history, and physical exam. You may have tests, such as:  Blood tests to measure uric acid levels.  Removal of joint fluid with a thin needle (aspiration) to look for uric acid crystals.  X-rays to look for joint damage. How is this treated? Treatment for this condition has two phases: treating an acute attack and preventing future attacks. Acute gout treatment may include medicines to reduce pain and swelling, including:  NSAIDs.  Steroids. These are strong anti-inflammatory medicines that can be taken by mouth (orally) or injected into a joint.  Colchicine. This medicine  relieves pain and swelling when it is taken soon after an attack. It can be given by mouth or through an IV. Preventive treatment may include:  Daily use of smaller doses of NSAIDs or colchicine.  Use of a medicine that reduces uric acid levels in your blood.  Changes to your diet. You may need to see a dietitian about what to eat and drink to prevent gout. Follow these instructions at  home: During a gout attack   If directed, put ice on the affected area: ? Put ice in a plastic bag. ? Place a towel between your skin and the bag. ? Leave the ice on for 20 minutes, 2-3 times a day.  Raise (elevate) the affected joint above the level of your heart as often as possible.  Rest the joint as much as possible. If the affected joint is in your leg, you may be given crutches to use.  Follow instructions from your health care provider about eating or drinking restrictions. Avoiding future gout attacks  Follow a low-purine diet as told by your dietitian or health care provider. Avoid foods and drinks that are high in purines, including liver, kidney, anchovies, asparagus, herring, mushrooms, mussels, and beer.  Maintain a healthy weight or lose weight if you are overweight. If you want to lose weight, talk with your health care provider. It is important that you do not lose weight too quickly.  Start or maintain an exercise program as told by your health care provider. Eating and drinking  Drink enough fluids to keep your urine pale yellow.  If you drink alcohol: ? Limit how much you use to:  0-1 drink a day for women.  0-2 drinks a day for men. ? Be aware of how much alcohol is in your drink. In the [place] one drink equals one 12 oz bottle of beer (355 mL) one 5 oz glass of wine (148 mL), or one 1 oz glass of hard liquor (44 mL). General instructions  Take over-the-counter and prescription medicines only as told by your health care provider.  Do not drive or use heavy machinery while taking prescription pain medicine.  Return to your normal activities as told by your health care provider. Ask your health care provider what activities are safe for you.  Keep all follow-up visits as told by your health care provider. This is important. Contact a health care provider if you have:  Another gout attack.  Continuing symptoms of a gout attack after 10 days of  treatment.  Side effects from your medicines.  Chills or a fever.  Burning pain when you urinate.  Pain in your lower back or belly. Get help right away if you:  Have severe or uncontrolled pain.  Cannot urinate. Summary  Gout is painful swelling of the joints caused by inflammation.  The most common site of pain is the big toe, but it can affect other joints in the body.  Medicines and dietary changes can help to prevent and treat gout attacks. This information is not intended to replace advice given to you by your health care provider. Make sure you discuss any questions you have with your health care provider. Document Revised: 08/08/2017 Document Reviewed: 08/08/2017 Elsevier Patient Education  [employer].

## 2019-04-30 ENCOUNTER — Telehealth (HOSPITAL_COMMUNITY): Payer: Self-pay | Admitting: *Deleted

## 2019-04-30 ENCOUNTER — Other Ambulatory Visit: Payer: Self-pay | Admitting: Internal Medicine

## 2019-04-30 DIAGNOSIS — R0609 Other forms of dyspnea: Secondary | ICD-10-CM

## 2019-04-30 DIAGNOSIS — R5381 Other malaise: Secondary | ICD-10-CM | POA: Insufficient documentation

## 2019-04-30 DIAGNOSIS — I5022 Chronic systolic (congestive) heart failure: Secondary | ICD-10-CM

## 2019-04-30 NOTE — Telephone Encounter (Signed)
Called and left message for pt wife, sylvia.  Provided update that Dr. Quay Burow has placed a home PT order for deconditioning.  This will help increase his ability and tolerance for exercise.  Once he has completed home physical therapy, will ask for pulmonary rehab referral. Contact information provided. Cherre Huger, BSN Cardiac and Training and development officer

## 2019-05-01 ENCOUNTER — Other Ambulatory Visit: Payer: Self-pay

## 2019-05-01 ENCOUNTER — Ambulatory Visit (HOSPITAL_BASED_OUTPATIENT_CLINIC_OR_DEPARTMENT_OTHER): Payer: Medicare Other | Attending: Pulmonary Disease

## 2019-05-05 ENCOUNTER — Ambulatory Visit: Payer: Medicare Other

## 2019-05-14 ENCOUNTER — Telehealth: Payer: Self-pay | Admitting: Emergency Medicine

## 2019-05-14 NOTE — Telephone Encounter (Signed)
FYI

## 2019-05-14 NOTE — Telephone Encounter (Signed)
Samuel Moyer with [facility] called and stated patient was out of town for a funeral and was suppose to start services today. She was unable to reach patient today. She will keep trying. Thanks

## 2019-05-15 ENCOUNTER — Ambulatory Visit: Payer: Medicare Other | Admitting: Cardiology

## 2019-05-22 NOTE — Telephone Encounter (Signed)
Pt aware of why referral was placed and does not feel like he needs that now. Please disregard the orders.

## 2019-05-22 NOTE — Telephone Encounter (Signed)
Received call from [facility] and they have been unable to contact pt. When they did contact pt before going out of town, he had no idea there was a referral placed and why he needs PT. Can you reach pt to explain the reason?

## 2019-05-22 NOTE — Telephone Encounter (Signed)
PT was ordered to improve his strength, stamina and shortness of breath

## 2019-05-22 NOTE — Telephone Encounter (Signed)
Referral disregarded. Msg was sent to [facility] at [place]

## 2019-05-28 ENCOUNTER — Encounter: Payer: Self-pay | Admitting: Family Medicine

## 2019-05-28 ENCOUNTER — Ambulatory Visit (INDEPENDENT_AMBULATORY_CARE_PROVIDER_SITE_OTHER): Payer: Medicare Other

## 2019-05-28 ENCOUNTER — Other Ambulatory Visit: Payer: Self-pay | Admitting: Internal Medicine

## 2019-05-28 ENCOUNTER — Other Ambulatory Visit: Payer: Self-pay

## 2019-05-28 ENCOUNTER — Ambulatory Visit: Payer: Self-pay

## 2019-05-28 ENCOUNTER — Ambulatory Visit (INDEPENDENT_AMBULATORY_CARE_PROVIDER_SITE_OTHER): Payer: Medicare Other | Admitting: Family Medicine

## 2019-05-28 VITALS — BP 158/80 | HR 61 | Ht 67.0 in | Wt 220.4 lb

## 2019-05-28 DIAGNOSIS — R600 Localized edema: Secondary | ICD-10-CM | POA: Diagnosis not present

## 2019-05-28 DIAGNOSIS — M10171 Lead-induced gout, right ankle and foot: Secondary | ICD-10-CM | POA: Diagnosis not present

## 2019-05-28 DIAGNOSIS — M25571 Pain in right ankle and joints of right foot: Secondary | ICD-10-CM

## 2019-05-28 DIAGNOSIS — T560X1A Toxic effect of lead and its compounds, accidental (unintentional), initial encounter: Secondary | ICD-10-CM | POA: Diagnosis not present

## 2019-05-28 MED ORDER — ALLOPURINOL 300 MG PO TABS: 300.0000 mg | ORAL_TABLET | Freq: Every day | ORAL | 3 refills | Status: DC

## 2019-05-28 MED ORDER — COLCHICINE 0.6 MG PO TABS
0.6000 mg | ORAL_TABLET | Freq: Every day | ORAL | 3 refills | Status: DC
Start: 2019-05-28 — End: 2019-08-08

## 2019-05-28 NOTE — Progress Notes (Signed)
I, Samuel Moyer, LAT, ATC, am serving as scribe for Dr. Lynne Leader.  Samuel Moyer. is a 79 y.o. male who presents to [place] at [facility] today for R ankle pain and swelling x 3 days after he fell in his yard and fell into a bush.  The fall happened about 6 days ago.  He did not start having pain at about 3 days ago.  He did not have immediate pain or swelling after the fall.  He was last seen by Dr. Tamala Julian on 03/06/18 for L knee/leg pain.  Today, he locates his R ankle pain to his R lateral ankle is also having some pain in his R post knee .  He rates his pain as mild at rest and moderate at it's worst.  Significant recent history for multiple gout flares.  Taking intermittent steroids which do help temporarily.  Not currently on uric acid lowering medication.  Radiating pain: Yes along his R calf and to the R post knee R ankle swelling: Yes Aggravating factors: getting out of bed; dressing; walking Treatments tried:Tylenol   Pertinent review of systems: No fevers or chills  Relevant historical information: History of gout and venous stasis dermatitis.   Exam:  BP (!) 158/80 (BP Location: Right Arm, Patient Position: Sitting, Cuff Size: Large)   Pulse 61   Ht 5\' 7"  (1.702 m)   Wt 220 lb 6.4 oz (100 kg)   SpO2 95%   BMI 34.52 kg/m  General: Well Developed, well nourished, and in no acute distress.   MSK: Right ankle: Moderate effusion present.  No erythema however in the setting of venous stasis dermatitis more proximal into the calf and lower leg. Not particularly tender palpation.  Decreased ankle motion.    Lab and Radiology Results X-ray images right ankle obtained today personally independently reviewed. Distal fibula visible on AP but not fully visible on mortise view and lateral.  No obvious fracture per my interpretation. Await formal radiology review  Lab Results  Component Value Date   LABURIC 9.3 (H) 10/17/2018       Assessment and  Plan: 79 y.o. male with right ankle pain and swelling.  Patient did have pain worsening after a trip and fall however he had a 3-day period without pain after the injury until the swelling started.  Pain today due predominantly to gout.  Uric acid significantly elevated when last checked in September and patient has had multiple flares.  Plan to start allopurinol with colchicine prophylaxis.  Check uric acid level today.  Creatinine checked recently stable.  Recheck in 2 weeks.  Precautions reviewed.  Return sooner if needed.  X-ray over read pending.   PDMP not reviewed this encounter. Orders Placed This Encounter  Procedures  . DG Ankle Complete Right    Standing Status:   Future    Number of Occurrences:   1    Standing Expiration Date:   07/27/2020    Order Specific Question:   Reason for Exam (SYMPTOM  OR DIAGNOSIS REQUIRED)    Answer:   fell into bush. Ankle pain since    Order Specific Question:   Preferred imaging location?    Answer:   [facility]    Order Specific Question:   Radiology Contrast Protocol - do NOT remove file path    Answer:   [email or web address]  . Uric acid    Standing Status:   Future    Number of Occurrences:   1  Standing Expiration Date:   05/27/2020   Meds ordered this encounter  Medications  . colchicine 0.6 MG tablet    Sig: Take 1 tablet (0.6 mg total) by mouth daily. To prevent gout flair    Dispense:  30 tablet    Refill:  3  . allopurinol (ZYLOPRIM) 300 MG tablet    Sig: Take 1 tablet (300 mg total) by mouth daily. To lower uric acid for gout    Dispense:  30 tablet    Refill:  3     Discussed warning signs or symptoms. Please see discharge instructions. Patient expresses understanding.   The above documentation has been reviewed and is accurate and complete Lynne Leader

## 2019-05-28 NOTE — Patient Instructions (Signed)
Thank you for coming in today. Get xray today and labs today.  Start colchicine daily to prevent gout flair.  Start allopurinol to lower uric acid.  Recheck in 2 weeks or so.  Return sooner if needed.    Gout  Gout is painful swelling of your joints. Gout is a type of arthritis. It is caused by having too much uric acid in your body. Uric acid is a chemical that is made when your body breaks down substances called purines. If your body has too much uric acid, sharp crystals can form and build up in your joints. This causes pain and swelling. Gout attacks can happen quickly and be very painful (acute gout). Over time, the attacks can affect more joints and happen more often (chronic gout). What are the causes?  Too much uric acid in your blood. This can happen because: ? Your kidneys do not remove enough uric acid from your blood. ? Your body makes too much uric acid. ? You eat too many foods that are high in purines. These foods include organ meats, some seafood, and beer.  Trauma or stress. What increases the risk?  Having a family history of gout.  Being male and middle-aged.  Being male and having gone through menopause.  Being very overweight (obese).  Drinking alcohol, especially beer.  Not having enough water in the body (being dehydrated).  Losing weight too quickly.  Having an organ transplant.  Having lead poisoning.  Taking certain medicines.  Having kidney disease.  Having a skin condition called psoriasis. What are the signs or symptoms? An attack of acute gout usually happens in just one joint. The most common place is the big toe. Attacks often start at night. Other joints that may be affected include joints of the feet, ankle, knee, fingers, wrist, or elbow. Symptoms of an attack may include:  Very bad pain.  Warmth.  Swelling.  Stiffness.  Shiny, red, or purple skin.  Tenderness. The affected joint may be very painful to touch.  Chills and  fever. Chronic gout may cause symptoms more often. More joints may be involved. You may also have white or yellow lumps (tophi) on your hands or feet or in other areas near your joints. How is this treated?  Treatment for this condition has two phases: treating an acute attack and preventing future attacks.  Acute gout treatment may include: ? NSAIDs. ? Steroids. These are taken by mouth or injected into a joint. ? Colchicine. This medicine relieves pain and swelling. It can be given by mouth or through an IV tube.  Preventive treatment may include: ? Taking small doses of NSAIDs or colchicine daily. ? Using a medicine that reduces uric acid levels in your blood. ? Making changes to your diet. You may need to see a food expert (dietitian) about what to eat and drink to prevent gout. Follow these instructions at home: During a gout attack   If told, put ice on the painful area: ? Put ice in a plastic bag. ? Place a towel between your skin and the bag. ? Leave the ice on for 20 minutes, 2-3 times a day.  Raise (elevate) the painful joint above the level of your heart as often as you can.  Rest the joint as much as possible. If the joint is in your leg, you may be given crutches.  Follow instructions from your doctor about what you cannot eat or drink. Avoiding future gout attacks  Eat a low-purine diet.  Avoid foods and drinks such as: ? Liver. ? Kidney. ? Anchovies. ? Asparagus. ? Herring. ? Mushrooms. ? Mussels. ? Beer.  Stay at a healthy weight. If you want to lose weight, talk with your doctor. Do not lose weight too fast.  Start or continue an exercise plan as told by your doctor. Eating and drinking  Drink enough fluids to keep your pee (urine) pale yellow.  If you drink alcohol: ? Limit how much you use to:  0-1 drink a day for women.  0-2 drinks a day for men. ? Be aware of how much alcohol is in your drink. In the [place] one drink equals one 12 oz bottle of  beer (355 mL), one 5 oz glass of wine (148 mL), or one 1 oz glass of hard liquor (44 mL). General instructions  Take over-the-counter and prescription medicines only as told by your doctor.  Do not drive or use heavy machinery while taking prescription pain medicine.  Return to your normal activities as told by your doctor. Ask your doctor what activities are safe for you.  Keep all follow-up visits as told by your doctor. This is important. Contact a doctor if:  You have another gout attack.  You still have symptoms of a gout attack after 10 days of treatment.  You have problems (side effects) because of your medicines.  You have chills or a fever.  You have burning pain when you pee (urinate).  You have pain in your lower back or belly. Get help right away if:  You have very bad pain.  Your pain cannot be controlled.  You cannot pee. Summary  Gout is painful swelling of the joints.  The most common site of pain is the big toe, but it can affect other joints.  Medicines and avoiding some foods can help to prevent and treat gout attacks. This information is not intended to replace advice given to you by your health care provider. Make sure you discuss any questions you have with your health care provider. Document Revised: 08/08/2017 Document Reviewed: 08/08/2017 Elsevier Patient Education  [place].

## 2019-05-29 LAB — URIC ACID: Uric Acid, Serum: 9.2 mg/dL — ABNORMAL HIGH (ref 4.0–7.8)

## 2019-05-29 NOTE — Progress Notes (Signed)
X-ray ankle does not show any fractures.  It does show a lot of swelling.

## 2019-06-03 ENCOUNTER — Other Ambulatory Visit: Payer: Self-pay | Admitting: Internal Medicine

## 2019-06-11 ENCOUNTER — Ambulatory Visit (INDEPENDENT_AMBULATORY_CARE_PROVIDER_SITE_OTHER): Payer: Medicare Other | Admitting: Family Medicine

## 2019-06-11 ENCOUNTER — Encounter: Payer: Self-pay | Admitting: Family Medicine

## 2019-06-11 ENCOUNTER — Other Ambulatory Visit: Payer: Self-pay

## 2019-06-11 VITALS — BP 114/70 | HR 59 | Ht 67.0 in | Wt 226.2 lb

## 2019-06-11 DIAGNOSIS — M1A1710 Lead-induced chronic gout, right ankle and foot, without tophus (tophi): Secondary | ICD-10-CM | POA: Diagnosis not present

## 2019-06-11 DIAGNOSIS — T560X1D Toxic effect of lead and its compounds, accidental (unintentional), subsequent encounter: Secondary | ICD-10-CM

## 2019-06-11 DIAGNOSIS — I872 Venous insufficiency (chronic) (peripheral): Secondary | ICD-10-CM | POA: Diagnosis not present

## 2019-06-11 NOTE — Patient Instructions (Signed)
Thank you for coming in today.  Continue colchicine for about a total of 3 months.  This will keep your body from reacting to gout flair.  Take allopurinol to lower uric acid and reduce gout.  Get labs checked in 2 months.  Return as needed.

## 2019-06-11 NOTE — Progress Notes (Signed)
I, [name], LAT, ATC, am serving as scribe for Dr. Lynne Leader.  Samuel Moyer. is a 79 y.o. male who presents to [place] at [facility] today for f/u of R ankle pain.  He was last seen by Dr. Georgina Snell on 05/28/19 and was prescribed cholchicine and allopurinol.  Since his last visit, pt reports that his R ankle is feeling much better.  He states that he feels the majority of his discomfort in his R lower leg but con't to have swelling in his R ankle.  He rates his improvement at 80%.  He con't to take the cholchicine and allopurinol.  Diagnostic testing: R ankle XR- 05/28/19   Pertinent review of systems: No fevers or chills  Relevant historical information: Chronic gout.  Heart failure.  Sleep apnea.   Exam:  BP 114/70 (BP Location: Right Arm, Patient Position: Sitting, Cuff Size: Large)   Pulse (!) 59   Ht 5' 7" (1.702 m)   Wt 226 lb 3.2 oz (102.6 kg)   SpO2 95%   BMI 35.43 kg/m  General: Well Developed, well nourished, and in no acute distress.   MSK: Right ankle still somewhat swollen.  However contralateral left leg has similar amount of lower extremity swelling consistent with edema. Bilateral lower extremities skin is slightly red consistent with venous stasis dermatitis. Estimate 1-2+ mild pitting edema bilateral extremities. Normal foot and ankle motion right ankle. Mild antalgic gait.    Lab and Radiology Results EXAM: RIGHT ANKLE - COMPLETE 3+ VIEW  COMPARISON:  None.  FINDINGS: No fracture or malalignment. Ankle mortise is symmetric. Degenerative changes medially and laterally. Extensive soft tissue edema. Streaky lucencies within the lower leg and foot. No radiopaque foreign body.  IMPRESSION: 1. No acute osseous abnormality. 2. Streaky lucencies within the soft tissues of the lower leg, ankle and foot are indeterminate for prominent fat lobules versus soft tissue gas. CT could be obtained if clinical presentation is suspect for  infection.  These results will be called to the ordering clinician or representative by the Radiologist Assistant, and communication documented in the PACS or [employer].   Electronically Signed   By: Donavan Foil M.D.   On: 05/28/2019 23:13 I, Lynne Leader, personally (independently) visualized and performed the interpretation of the images attached in this note.  Lab Results  Component Value Date   LABURIC 9.2 (H) 05/28/2019     Chemistry      Component Value Date/Time   NA 139 04/02/2019 1223   K 4.0 04/02/2019 1223   CL 105 04/02/2019 1223   CO2 27 04/02/2019 1223   BUN 19 04/02/2019 1223   CREATININE 1.17 04/02/2019 1223      Component Value Date/Time   CALCIUM 9.4 04/02/2019 1223   ALKPHOS 71 04/02/2019 1223   AST 23 04/02/2019 1223   ALT 20 04/02/2019 1223   BILITOT 1.4 (H) 04/02/2019 1223          Assessment and Plan: 79 y.o. male with right ankle pain and swelling due to gout.  Significant improvement with initial colchicine.  Allopurinol started as uric acid is elevated at 9.2.  Goal to get uric acid less than 6 if possible. Plan for colchicine prophylaxis while getting started on allopurinol for 3 months. Additionally have started allopurinol at 300 mg daily.  Will check metabolic panel and uric acid in about 2 months. We will adjust allopurinol dose as needed. Recheck back with me as needed.  Additionally patient has  venous stasis dermatitis.  Recommend compression stockings especially with prolonged standing.  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    Orders Placed This Encounter  Procedures  . Comp Met (CMET)    Standing Status:   Future    Standing Expiration Date:   06/10/2020  . Uric acid    Standing Status:   Future    Standing Expiration Date:   06/10/2020   No orders of the defined types were placed in this encounter.    Discussed warning signs or symptoms. Please  see discharge instructions. Patient expresses understanding.   The above documentation has been reviewed and is accurate and complete Lynne Leader

## 2019-06-27 ENCOUNTER — Telehealth: Payer: Self-pay | Admitting: Cardiology

## 2019-06-27 NOTE — Telephone Encounter (Signed)
I called patient 06/27/19 to schedule follow up visit from Dr.Jordan's recall list. The patient didn't answer so I left message for patient to return call to get that appt scheduled

## 2019-07-11 NOTE — Progress Notes (Signed)
Samuel Moyer. Date of Birth: 04/20/1940   History of Present Illness: Samuel Moyer is seen today for followup of CHF and atrial fibrillation. He has a history of permanent atrial fibrillation. He also is a history of congestive heart failure with ejection fraction of 40-45%. Normal myoview in 2011 with EF of 50% at that time. He does have OSA on CPAP and is followed by pulmonary.   In late February he noted increased SOB. Was seen by Dr Quay Burow and BNP was elevated at 772 which was above his baseline of 600. His lasix was increased for a few days. He states he really didn't notice much change in his breathing. Echo showed good EF 50-55%, severe biatrial enlargement in setting of chronic AFib, and moderate pulmonary HTN. The RV was noted to be moderately enlarged with normal systolic function.  He is back working at [employer]. Walks up 2 flights of 29 stairs. Toward the end of his shift he notes he gets out of breath when he walks those 2 flights. No chest pain or dizziness. Weight is down 10 lbs which he attributes to his increased activity. Legs still swell some. He is wearing support hose.    Current Outpatient Medications on File Prior to Visit  Medication Sig Dispense Refill  . allopurinol (ZYLOPRIM) 300 MG tablet Take 1 tablet (300 mg total) by mouth daily. To lower uric acid for gout 30 tablet 3  . amLODipine (NORVASC) 5 MG tablet TAKE ONE AND ONE-HALF TABLETS DAILY (NEED TO SCHEDULE APPOINTMENT) 135 tablet 3  . atorvastatin (LIPITOR) 10 MG tablet TAKE 1 TABLET DAILY 90 tablet 0  . benazepril (LOTENSIN) 40 MG tablet TAKE 1 TABLET DAILY 90 tablet 1  . clonazePAM (KLONOPIN) 0.5 MG tablet Take 1 tablet (0.5 mg total) by mouth 2 (two) times daily. 60 tablet 1  . colchicine 0.6 MG tablet Take 1 tablet (0.6 mg total) by mouth daily. To prevent gout flair 30 tablet 3  . dabigatran (PRADAXA) 150 MG CAPS capsule TAKE 1 CAPSULE EVERY 12 HOURS 180 capsule 3  . furosemide (LASIX) 40 MG tablet  Take 40 mg in the morning and 20 mg in the afternoon 145 tablet 3  . LUMIGAN 0.01 % SOLN Place 1 drop into both eyes at bedtime.     . metoprolol tartrate (LOPRESSOR) 25 MG tablet TAKE 2 TABLETS TWICE A DAY (NEED OFFICE VISIT) (SCHEDULE AN APPOINTMENT FOR FUTURE REFILLS) (Patient taking differently: Take 50 mg by mouth 2 (two) times daily. TAKE 2 TABLETS TWICE A DAY (NEED OFFICE VISIT) (SCHEDULE AN APPOINTMENT FOR FUTURE REFILLS)) 360 tablet 3  . Multiple Vitamin (MULTIVITAMIN) tablet Take 1 tablet by mouth daily.      Marland Kitchen omeprazole (PRILOSEC) 20 MG capsule Take 1 capsule (20 mg total) by mouth daily. Take 30 minutes prior to a meal 90 capsule 1  . ONETOUCH DELICA LANCETS 89Q MISC 1 each by Other route daily. Check blood sugar daily as directed (Patient taking differently: 1 each by Other route daily. Check blood sugar PRN as directed) 100 each 0  . PARoxetine (PAXIL) 20 MG tablet TAKE ONE-HALF (1/2) TABLET DAILY 45 tablet 1   No current facility-administered medications on file prior to visit.    No Known Allergies  Past Medical History:  Diagnosis Date  . A-fib ([place])   . Anxiety   . Atrial fibrillation ([place])   . CHF (congestive heart failure) ([place])   . Claustrophobia    Occasionally when flying   .  Colitis   . Diabetes mellitus, type 2 ([place])   . Fracture of one rib, left side, initial encounter for closed fracture 12/27/2016   Occurred 12/21/16 after a fall at home.  Left anterior seventh rib  . Gout   . Hemorrhoids   . Hyperlipidemia   . Hypertension   . LV dysfunction    EF 40-45%  . OSA (obstructive sleep apnea)    CPAP machine   . PVC's (premature ventricular contractions)     Past Surgical History:  Procedure Laterality Date  . CARDIOVASCULAR STRESS TEST  03/02/2010   EF 50%  . US ECHOCARDIOGRAPHY  11/15/2009   EF 40-45%    Social History   Tobacco Use  Smoking Status Former Smoker  . Packs/day: 1.00  . Years: 16.00  . Pack years: 16.00  . Types: Cigarettes  .  Quit date: 06/30/1977  . Years since quitting: 42.0  Smokeless Tobacco Never Used    Social History   Substance and Sexual Activity  Alcohol Use No    Family History  Problem Relation Age of Onset  . Hypertension Father   . Heart attack Father        Age 75 (MI)  . Heart failure Father   . Colonic polyp Sister        and Father  . Stroke Mother   . Colon cancer Neg Hx   . Stomach cancer Neg Hx     Review of Systems: As noted in history of present illness  All other systems were reviewed and are negative.  Physical Exam: BP 140/70 (BP Location: Right Arm, Cuff Size: Normal)   Pulse 72   Ht 5\' 8"  (1.727 m)   Wt 216 lb 12.8 oz (98.3 kg)   SpO2 93%   BMI 32.96 kg/m  GENERAL:  Well appearing obese WM in NAD HEENT:  PERRL, EOMI, sclera are clear. Oropharynx is clear. NECK:  No jugular venous distention, carotid upstroke brisk and symmetric, no bruits, no thyromegaly or adenopathy LUNGS:  Clear to auscultation bilaterally CHEST:  Unremarkable HEART:  IRRR,  PMI not displaced or sustained,S1 and S2 within normal limits, no S3, no S4: no clicks, no rubs, no murmurs ABD:  Soft, nontender. BS +, no masses or bruits. No hepatomegaly, no splenomegaly EXT:  2 + pulses throughout, 1+ lower extremity edema, no cyanosis no clubbing SKIN:  Warm and dry.  No rashes NEURO:  Alert and oriented x 3. Cranial nerves II through XII intact. PSYCH:  Cognitively intact    LABORATORY DATA: Lab Results  Component Value Date   WBC 7.7 04/02/2019   HGB 13.2 04/02/2019   HCT 39.2 04/02/2019   PLT 207.0 04/02/2019   GLUCOSE 115 (H) 04/02/2019   CHOL 148 10/17/2018   TRIG 65.0 10/17/2018   HDL 56.70 10/17/2018   LDLCALC 79 10/17/2018   ALT 20 04/02/2019   AST 23 04/02/2019   NA 139 04/02/2019   K 4.0 04/02/2019   CL 105 04/02/2019   CREATININE 1.17 04/02/2019   BUN 19 04/02/2019   CO2 27 04/02/2019   TSH 1.30 06/01/2016   PSA 0.63 09/12/2007   HGBA1C 5.7 04/02/2019   MICROALBUR 1.8  06/13/2013    Echo 01/09/19: IMPRESSIONS    1. Technically difficult study. Left ventricular ejection fraction, by  visual estimation, is 50 to 55%. The left ventricle has low normal  function. There is no left ventricular hypertrophy.  2. Left ventricular diastolic parameters are indeterminate.  3. Global right  ventricle has normal systolic function.The right  ventricular size is moderately enlarged.  4. There is mild dilatation of the ascending aorta measuring 36 mm.  5. Right atrial size was severely dilated.  6. Left atrial size was severely dilated.  7. The aortic valve was not well visualized. Aortic valve regurgitation  is mild. No evidence of aortic valve stenosis.  8. The mitral valve is normal in structure. Mild mitral valve  regurgitation.  9. The tricuspid valve is normal in structure. Tricuspid valve  regurgitation is trivial.  10. The pulmonic valve was not well visualized. Pulmonic valve  regurgitation is not visualized.  11. The inferior vena cava is normal in size with greater than 50%  respiratory variability, suggesting right atrial pressure of 3 mmHg.  12. The tricuspid regurgitant velocity is 3.28 m/s, and with an assumed  right atrial pressure of 3 mmHg, the estimated right ventricular systolic  pressure is moderately elevated at 46.0 mmHg.    Assessment / Plan: 1. Permanent atrial fibrillation. Rate is well controlled.  Continue metoprolol.  He is on chronic anticoagulation with Pradaxa. Renal function and Hgb have been normal.  2. 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 Last  Echo in  December showed EF 50-55% there was some evidence of pulmonary HTN.  Continue ACE inhibitor, beta blocker, and diuretic therapy. I am pleased with his weight loss and increased aerobic activity.Continue sodium restriction. Wear support hose.  3. Hypertension, BP is  controlled.   4. OSA. Unable to complete last sleep study due to  claustophobia. Using CPAP. Follow up with pulmonary.

## 2019-07-14 ENCOUNTER — Other Ambulatory Visit: Payer: Self-pay

## 2019-07-14 ENCOUNTER — Encounter: Payer: Self-pay | Admitting: Cardiology

## 2019-07-14 ENCOUNTER — Ambulatory Visit (INDEPENDENT_AMBULATORY_CARE_PROVIDER_SITE_OTHER): Payer: Medicare Other | Admitting: Cardiology

## 2019-07-14 VITALS — BP 140/70 | HR 72 | Ht 68.0 in | Wt 216.8 lb

## 2019-07-14 DIAGNOSIS — I482 Chronic atrial fibrillation, unspecified: Secondary | ICD-10-CM

## 2019-07-14 DIAGNOSIS — I5042 Chronic combined systolic (congestive) and diastolic (congestive) heart failure: Secondary | ICD-10-CM | POA: Diagnosis not present

## 2019-07-14 DIAGNOSIS — E7800 Pure hypercholesterolemia, unspecified: Secondary | ICD-10-CM | POA: Diagnosis not present

## 2019-07-14 DIAGNOSIS — I10 Essential (primary) hypertension: Secondary | ICD-10-CM

## 2019-07-15 ENCOUNTER — Telehealth: Payer: Self-pay | Admitting: Cardiology

## 2019-07-15 NOTE — Telephone Encounter (Signed)
Called patient 07/15/19 to schedule follow up visit with Dr.Jordan from patients recall list. The patient didn't answer, left message for patient to return call to get appt scheduled.

## 2019-08-07 ENCOUNTER — Other Ambulatory Visit: Payer: Self-pay | Admitting: Internal Medicine

## 2019-08-08 ENCOUNTER — Encounter: Payer: Self-pay | Admitting: Internal Medicine

## 2019-08-08 ENCOUNTER — Ambulatory Visit (INDEPENDENT_AMBULATORY_CARE_PROVIDER_SITE_OTHER): Payer: Medicare Other | Admitting: Internal Medicine

## 2019-08-08 ENCOUNTER — Other Ambulatory Visit: Payer: Self-pay

## 2019-08-08 DIAGNOSIS — L237 Allergic contact dermatitis due to plants, except food: Secondary | ICD-10-CM | POA: Diagnosis not present

## 2019-08-08 DIAGNOSIS — L03115 Cellulitis of right lower limb: Secondary | ICD-10-CM

## 2019-08-08 MED ORDER — DOXYCYCLINE HYCLATE 100 MG PO TABS
100.0000 mg | ORAL_TABLET | Freq: Two times a day (BID) | ORAL | 0 refills | Status: DC
Start: 2019-08-08 — End: 2019-10-08

## 2019-08-08 MED ORDER — ALLOPURINOL 300 MG PO TABS: 300.0000 mg | ORAL_TABLET | Freq: Every day | ORAL | 1 refills | Status: DC

## 2019-08-08 MED ORDER — COLCHICINE 0.6 MG PO TABS: 0.6000 mg | ORAL_TABLET | Freq: Every day | ORAL | 1 refills | Status: DC

## 2019-08-08 MED ORDER — ATORVASTATIN CALCIUM 10 MG PO TABS: 10.0000 mg | ORAL_TABLET | Freq: Every day | ORAL | 1 refills | Status: DC

## 2019-08-08 NOTE — Patient Instructions (Addendum)
  Medications reviewed and updated.  Changes include :   Start doxycycline for the skin infection.    Continue your gout prevention medication.   Your prescription(s) have been submitted to your pharmacy. Please take as directed and contact our office if you believe you are having problem(s) with the medication(s).   When you are sitting elevate your leg.  Keep the leg covered when you are not at home or outside.     Please call if there is no improvement in your symptoms.

## 2019-08-08 NOTE — Progress Notes (Signed)
Subjective:    Patient ID: Samuel Picklesimer., male    DOB: Jan 25, 1941, 79 y.o.   MRN: [id number]  HPI The patient is here for an acute visit.  Started - at least one week ago - he worked out in the yard and was exposed to poison ivy and mosquitoes.  He has poison ivy on both b/l lower legs  Her right lower leg is swollen and red.  The blisters on his right lower leg popped and there is drainage.  He has been putting triple antibiotic ointment on it.  He has occasional sharp pains in his right lower leg that traveled up his leg.  He denies any fevers or chills.  The area of blisters is itching, but it is intermittent and tolerable.  The poison ivy has not spread.  He has a small area on his left lower leg.       Medications and allergies reviewed with patient and updated if appropriate.  Patient Active Problem List   Diagnosis Date Noted  . Physical deconditioning 04/30/2019  . Coughing 04/02/2019  . Dyspnea on exertion 11/01/2018  . Strain of left quadriceps 02/22/2018  . 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 Acute pain of right knee 12/25/2017  . Fall 12/27/2016  . Gout 06/03/2015  . Prediabetes 03/05/2015  . Obesity 12/28/2014  . Venous (peripheral) insufficiency 02/16/2014  . 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 ATRIAL FIBRILLATION  01/26/2010  . Chronic systolic heart failure ([place]) 01/26/2010  . Asthma 01/14/2010  . Hypercholesterolemia 10/21/2007  . Anxiety 10/21/2007  . Essential hypertension 02/26/2006  . COLONIC POLYPS, HX OF 02/26/2006    Current Outpatient Medications on File Prior to Visit  Medication Sig Dispense Refill  . allopurinol (ZYLOPRIM) 300 MG tablet Take 1 tablet (300 mg total) by mouth daily. To lower uric acid for gout 30 tablet 3  . amLODipine (NORVASC) 5 MG tablet TAKE ONE AND ONE-HALF TABLETS DAILY (NEED TO SCHEDULE APPOINTMENT) 135 tablet 3  . atorvastatin (LIPITOR) 10 MG tablet TAKE 1 TABLET DAILY 90 tablet 0  . clonazePAM  (KLONOPIN) 0.5 MG tablet Take 1 tablet (0.5 mg total) by mouth 2 (two) times daily. 60 tablet 1  . colchicine 0.6 MG tablet Take 1 tablet (0.6 mg total) by mouth daily. To prevent gout flair 30 tablet 3  . dabigatran (PRADAXA) 150 MG CAPS capsule TAKE 1 CAPSULE EVERY 12 HOURS 180 capsule 3  . furosemide (LASIX) 40 MG tablet Take 40 mg in the morning and 20 mg in the afternoon 145 tablet 3  . LUMIGAN 0.01 % SOLN Place 1 drop into both eyes at bedtime.     . metoprolol tartrate (LOPRESSOR) 25 MG tablet TAKE 2 TABLETS TWICE A DAY (NEED OFFICE VISIT) (SCHEDULE AN APPOINTMENT FOR FUTURE REFILLS) (Patient taking differently: Take 50 mg by mouth 2 (two) times daily. TAKE 2 TABLETS TWICE A DAY (NEED OFFICE VISIT) (SCHEDULE AN APPOINTMENT FOR FUTURE REFILLS)) 360 tablet 3  . Multiple Vitamin (MULTIVITAMIN) tablet Take 1 tablet by mouth daily.      Marland Kitchen omeprazole (PRILOSEC) 20 MG capsule Take 1 capsule (20 mg total) by mouth daily. Take 30 minutes prior to a meal 90 capsule 1  . ONETOUCH DELICA LANCETS 08M MISC 1 each by Other route daily. Check blood sugar daily as directed (Patient taking differently: 1 each by Other route daily. Check blood sugar PRN as directed) 100 each 0  . PARoxetine (PAXIL) 20 MG tablet TAKE ONE-HALF (1/2)  TABLET DAILY 45 tablet 1  . benazepril (LOTENSIN) 40 MG tablet TAKE 1 TABLET DAILY (Patient not taking: Reported on 08/08/2019) 90 tablet 1   No current facility-administered medications on file prior to visit.    Past Medical History:  Diagnosis Date  . A-fib ([place])   . Anxiety   . Atrial fibrillation ([place])   . CHF (congestive heart failure) ([place])   . Claustrophobia    Occasionally when flying   . Colitis   . Diabetes mellitus, type 2 ([place])   . Fracture of one rib, left side, initial encounter for closed fracture 12/27/2016   Occurred 12/21/16 after a fall at home.  Left anterior seventh rib  . Gout   . Hemorrhoids   . Hyperlipidemia   . Hypertension   . LV dysfunction     EF 40-45%  . OSA (obstructive sleep apnea)    CPAP machine   . PVC's (premature ventricular contractions)     Past Surgical History:  Procedure Laterality Date  . CARDIOVASCULAR STRESS TEST  03/02/2010   EF 50%  . US ECHOCARDIOGRAPHY  11/15/2009   EF 40-45%    Social History   Socioeconomic History  . Marital status: Married    Spouse name: Not on file  . Number of children: 3  . Years of education: Not on file  . Highest education level: Not on file  Occupational History  . Occupation: Retired    Fish farm manager: RETIRED    Comment: [employer]   Tobacco Use  . Smoking status: Former Smoker    Packs/day: 1.00    Years: 16.00    Pack years: 16.00    Types: Cigarettes    Quit date: 06/30/1977    Years since quitting: 42.1  . Smokeless tobacco: Never Used  Vaping Use  . Vaping Use: Never used  Substance and Sexual Activity  . Alcohol use: No  . Drug use: No  . Sexual activity: Not on file  Other Topics Concern  . Not on file  Social History Narrative   2 caffeine drinks daily    Social Determinants of Health   Financial Resource Strain:   . Difficulty of Paying Living Expenses:   Food Insecurity:   . Worried About Charity fundraiser in the Last Year:   . Arboriculturist in the Last Year:   Transportation Needs:   . Film/video editor (Medical):   Marland Kitchen Lack of Transportation (Non-Medical):   Physical Activity:   . Days of Exercise per Week:   . Minutes of Exercise per Session:   Stress:   . Feeling of Stress :   Social Connections:   . Frequency of Communication with Friends and Family:   . Frequency of Social Gatherings with Friends and Family:   . Attends Religious Services:   . Active Member of Clubs or Organizations:   . Attends Archivist Meetings:   Marland Kitchen Marital Status:     Family History  Problem Relation Age of Onset  . Hypertension Father   . Heart attack Father        Age 3 (MI)  . Heart failure Father   . Colonic polyp Sister         and Father  . Stroke Mother   . Colon cancer Neg Hx   . Stomach cancer Neg Hx     Review of Systems  Constitutional: Negative for chills and fever.  Cardiovascular: Positive for leg swelling (Right leg more than left).  Skin: Positive for color change and wound.       Objective:   Vitals:   08/08/19 1005  BP: 140/82  Pulse: 60  Temp: 98.4 F (36.9 C)  SpO2: 97%   BP Readings from Last 3 Encounters:  08/08/19 140/82  07/14/19 140/70  06/11/19 114/70   Wt Readings from Last 3 Encounters:  08/08/19 216 lb (98 kg)  07/14/19 216 lb 12.8 oz (98.3 kg)  06/11/19 226 lb 3.2 oz (102.6 kg)   Body mass index is 32.84 kg/m.   Physical Exam Constitutional:      General: He is not in acute distress.    Appearance: Normal appearance. He is not ill-appearing.  HENT:     Head: Normocephalic and atraumatic.  Musculoskeletal:     Right lower leg: Edema (Mild generalized swelling of right lower leg) present.     Left lower leg: Edema (Trace left lower leg edema) present.  Skin:    General: Skin is warm and dry.     Findings: Erythema (Generalized erythema right lower extremity) and lesion (3 popped blisters anterior right lower leg and one very small one on the left posterior lower leg-clear fluid drainage) present.  Neurological:     Mental Status: He is alert.            Assessment & Plan:    See Problem List for Assessment and Plan of chronic medical problems.    This visit occurred during the SARS-CoV-2 public health emergency.  Safety protocols were in place, including screening questions prior to the visit, additional usage of staff PPE, and extensive cleaning of exam room while observing appropriate contact time as indicated for disinfecting solutions.

## 2019-08-08 NOTE — Assessment & Plan Note (Signed)
Acute Related to poison ivy with popped blisters, mosquito bites Right lower extremity is erythematous, swollen and warm to touch Start doxycycline twice daily x10 days Apply antibiotic material ointment to blisters and keep them covered when not at home Monitor infection closely-if no improvement or not completely resolving needs to follow-up

## 2019-08-08 NOTE — Assessment & Plan Note (Signed)
Acute Poison ivy on bilateral lower legs since 1 week ago There has not been any additional spreading He did have blister formations notes blisters have popped and he does have some clear fluid and subsequent cellulitis Itching is intermittent and tolerable We will treat cellulitis Symptomatic treatment for poison ivy

## 2019-08-11 ENCOUNTER — Other Ambulatory Visit: Payer: Medicare Other

## 2019-08-12 ENCOUNTER — Other Ambulatory Visit (INDEPENDENT_AMBULATORY_CARE_PROVIDER_SITE_OTHER): Payer: Medicare Other

## 2019-08-12 DIAGNOSIS — T560X1D Toxic effect of lead and its compounds, accidental (unintentional), subsequent encounter: Secondary | ICD-10-CM

## 2019-08-12 DIAGNOSIS — M1A1710 Lead-induced chronic gout, right ankle and foot, without tophus (tophi): Secondary | ICD-10-CM

## 2019-08-12 LAB — COMPREHENSIVE METABOLIC PANEL
ALT: 16 U/L (ref 0–53)
AST: 20 U/L (ref 0–37)
Albumin: 4.2 g/dL (ref 3.5–5.2)
Alkaline Phosphatase: 71 U/L (ref 39–117)
BUN: 20 mg/dL (ref 6–23)
CO2: 27 mEq/L (ref 19–32)
Calcium: 9.2 mg/dL (ref 8.4–10.5)
Chloride: 104 mEq/L (ref 96–112)
Creatinine, Ser: 1.14 mg/dL (ref 0.40–1.50)
GFR: 61.81 mL/min (ref >60.00)
Glucose, Bld: 98 mg/dL (ref 70–99)
Potassium: 3.9 mEq/L (ref 3.5–5.1)
Sodium: 139 mEq/L (ref 135–145)
Total Bilirubin: 0.9 mg/dL (ref 0.2–1.2)
Total Protein: 7 g/dL (ref 6.0–8.3)

## 2019-08-12 LAB — URIC ACID: Uric Acid, Serum: 10.1 mg/dL — ABNORMAL HIGH (ref 4.0–7.8)

## 2019-08-13 NOTE — Progress Notes (Signed)
Uric acid is significantly elevated.  Are you taking the allopurinol that I prescribed?

## 2019-08-14 ENCOUNTER — Telehealth: Payer: Self-pay | Admitting: Family Medicine

## 2019-08-14 NOTE — Telephone Encounter (Signed)
Returned pt's call and advised him to resume his Allopurinol medication which he is to be taking daily long term I.e. forever and that he is to be taking his cholchicine medication daily until mid-August per his last OV note w/ Dr.Corey from May 2021.

## 2019-08-14 NOTE — Telephone Encounter (Signed)
Patient called back regarding his lab results and Dr Corey's response. He said that he only took the Allopurinol for one month and then did not get it refilled. He recently saw Dr Quay Burow for an open wound on his leg and she prescribed Colchicine and Doxycycline. He just started those yesterday. Patient is scheduled to see the Health Coach for his Medicare Annual Wellness Visit tomorrow in Primary care and was going to review all of these medications with her as well. He seems confused about all the different ones prescribed by Dr Georgina Snell and Dr Quay Burow.

## 2019-08-14 NOTE — Telephone Encounter (Signed)
Allopurinol is going to be a long-term medicine to lower uric acid and thus eliminate gout.  Please restart it.

## 2019-08-15 ENCOUNTER — Other Ambulatory Visit: Payer: Self-pay

## 2019-08-15 ENCOUNTER — Ambulatory Visit (INDEPENDENT_AMBULATORY_CARE_PROVIDER_SITE_OTHER): Payer: Medicare Other

## 2019-08-15 VITALS — BP 138/78 | HR 55 | Temp 98.4°F | Ht 68.0 in | Wt 212.4 lb

## 2019-08-15 DIAGNOSIS — Z0000 Encounter for general adult medical examination without abnormal findings: Secondary | ICD-10-CM

## 2019-08-15 NOTE — Progress Notes (Signed)
Subjective:   Samuel Moyer. is a 79 y.o. male who presents for Medicare Annual/Subsequent preventive examination.  Review of Systems    No ROS. Medicare Wellness Visit. Additional risk factors are reflected in social history. Cardiac Risk Factors include: advanced age (>47men, >43 women);dyslipidemia;family history of premature cardiovascular disease;hypertension;male gender;obesity (BMI >30kg/m2)     Objective:    Today's Vitals   08/15/19 0946 08/15/19 0949  BP: 138/78   Pulse: (!) 55   Temp: 98.4 F (36.9 C)   SpO2: 97%   Weight: 212 lb 6.4 oz (96.3 kg)   Height: 5\' 8"  (1.727 m)   PainSc: 0-No pain 2    Body mass index is 32.3 kg/m.  Advanced Directives 08/15/2019 05/01/2019 09/25/2018 03/27/2018 12/23/2016 06/01/2016 12/28/2014  Does Patient Have a Medical Advance Directive? Yes Yes Yes Yes No Yes Yes  Type of Advance Directive - [place];Living will [place];Living will [place];Living will - [place];Living will Living will;Healthcare Power of Attorney  Does patient want to make changes to medical advance directive? No - Patient declined No - Patient declined - - - - -  Copy of [place] in Chart? - No - copy requested No - copy requested No - copy requested - No - copy requested -    Current Medications (verified) Outpatient Encounter Medications as of 08/15/2019  Medication Sig  . allopurinol (ZYLOPRIM) 300 MG tablet Take 1 tablet (300 mg total) by mouth daily. To lower uric acid for gout  . amLODipine (NORVASC) 5 MG tablet TAKE ONE AND ONE-HALF TABLETS DAILY (NEED TO SCHEDULE APPOINTMENT)  . atorvastatin (LIPITOR) 10 MG tablet Take 1 tablet (10 mg total) by mouth daily. Annual appt w/labs due in Sept must see provider for future refills  . atorvastatin (LIPITOR) 10 MG tablet Take 1 tablet (10 mg total) by mouth daily.  . benazepril (LOTENSIN) 40 MG tablet TAKE 1 TABLET  DAILY (Patient not taking: Reported on 08/08/2019)  . clonazePAM (KLONOPIN) 0.5 MG tablet Take 1 tablet (0.5 mg total) by mouth 2 (two) times daily.  . colchicine 0.6 MG tablet Take 1 tablet (0.6 mg total) by mouth daily. To prevent gout flair  . dabigatran (PRADAXA) 150 MG CAPS capsule TAKE 1 CAPSULE EVERY 12 HOURS  . doxycycline (VIBRA-TABS) 100 MG tablet Take 1 tablet (100 mg total) by mouth 2 (two) times daily.  . furosemide (LASIX) 40 MG tablet Take 40 mg in the morning and 20 mg in the afternoon  . LUMIGAN 0.01 % SOLN Place 1 drop into both eyes at bedtime.   . metoprolol tartrate (LOPRESSOR) 25 MG tablet TAKE 2 TABLETS TWICE A DAY (NEED OFFICE VISIT) (SCHEDULE AN APPOINTMENT FOR FUTURE REFILLS) (Patient taking differently: Take 50 mg by mouth 2 (two) times daily. TAKE 2 TABLETS TWICE A DAY (NEED OFFICE VISIT) (SCHEDULE AN APPOINTMENT FOR FUTURE REFILLS))  . Multiple Vitamin (MULTIVITAMIN) tablet Take 1 tablet by mouth daily.    Marland Kitchen omeprazole (PRILOSEC) 20 MG capsule Take 1 capsule (20 mg total) by mouth daily. Take 30 minutes prior to a meal  . ONETOUCH DELICA LANCETS 96G MISC 1 each by Other route daily. Check blood sugar daily as directed (Patient taking differently: 1 each by Other route daily. Check blood sugar PRN as directed)  . PARoxetine (PAXIL) 20 MG tablet TAKE ONE-HALF (1/2) TABLET DAILY   No facility-administered encounter medications on file as of 08/15/2019.  Allergies (verified) Patient has no known allergies.   History: Past Medical History:  Diagnosis Date  . A-fib ([place])   . Anxiety   . Atrial fibrillation ([place])   . CHF (congestive heart failure) ([place])   . Claustrophobia    Occasionally when flying   . Colitis   . Diabetes mellitus, type 2 ([place])   . Fracture of one rib, left side, initial encounter for closed fracture 12/27/2016   Occurred 12/21/16 after a fall at home.  Left anterior seventh rib  . Gout   . Hemorrhoids   . Hyperlipidemia   . Hypertension   .  LV dysfunction    EF 40-45%  . OSA (obstructive sleep apnea)    CPAP machine   . PVC's (premature ventricular contractions)    Past Surgical History:  Procedure Laterality Date  . CARDIOVASCULAR STRESS TEST  03/02/2010   EF 50%  . US ECHOCARDIOGRAPHY  11/15/2009   EF 40-45%   Family History  Problem Relation Age of Onset  . Hypertension Father   . Heart attack Father        Age 18 (MI)  . Heart failure Father   . Colonic polyp Sister        and Father  . Stroke Mother   . Colon cancer Neg Hx   . Stomach cancer Neg Hx    Social History   Socioeconomic History  . Marital status: Married    Spouse name: Not on file  . Number of children: 3  . Years of education: Not on file  . Highest education level: Not on file  Occupational History  . Occupation: Retired    Fish farm manager: RETIRED    Comment: [employer]   Tobacco Use  . Smoking status: Former Smoker    Packs/day: 1.00    Years: 16.00    Pack years: 16.00    Types: Cigarettes    Quit date: 06/30/1977    Years since quitting: 42.1  . Smokeless tobacco: Never Used  Vaping Use  . Vaping Use: Never used  Substance and Sexual Activity  . Alcohol use: No  . Drug use: No  . Sexual activity: Not on file  Other Topics Concern  . Not on file  Social History Narrative   2 caffeine drinks daily    Social Determinants of Health   Financial Resource Strain: Low Risk   . Difficulty of Paying Living Expenses: Not hard at all  Food Insecurity: No Food Insecurity  . Worried About Charity fundraiser in the Last Year: Never true  . Ran Out of Food in the Last Year: Never true  Transportation Needs: No Transportation Needs  . Lack of Transportation (Medical): No  . Lack of Transportation (Non-Medical): No  Physical Activity: Inactive  . Days of Exercise per Week: 0 days  . Minutes of Exercise per Session: 0 min  Stress: No Stress Concern Present  . Feeling of Stress : Not at all  Social Connections:   . Frequency of  Communication with Friends and Family:   . Frequency of Social Gatherings with Friends and Family:   . Attends Religious Services:   . Active Member of Clubs or Organizations:   . Attends Archivist Meetings:   Marland Kitchen Marital Status:     Tobacco Counseling Counseling given: No   Clinical Intake:  Pre-visit preparation completed: Yes  Pain : 0-10 Pain Score: 2  Pain Location: Leg Pain Orientation: Right Pain Descriptors / Indicators: Shooting, Discomfort Pain  Onset: More than a month ago Pain Frequency: Intermittent     BMI - recorded: 32.3 Nutritional Status: BMI > 30  Obese Nutritional Risks: None Diabetes: No  How often do you need to have someone help you when you read instructions, pamphlets, or other written materials from your doctor or pharmacy?: 1 - Never What is the last grade level you completed in school?: Bachelor Degree; Industrial/product designer  Diabetic? no  Interpreter Needed?: No  Information entered by :: [employer]. Jannell Franta, LPN   Activities of Daily Living In your present state of health, do you have any difficulty performing the following activities: 08/15/2019 04/02/2019  Hearing? Tempie Donning  Vision? N N  Difficulty concentrating or making decisions? N N  Walking or climbing stairs? N Y  Dressing or bathing? N N  Doing errands, shopping? N N  Preparing Food and eating ? N -  Using the Toilet? N -  In the past six months, have you accidently leaked urine? N -  Do you have problems with loss of bowel control? N -  Managing your Medications? N -  Managing your Finances? N -  Housekeeping or managing your Housekeeping? N -  Some recent data might be hidden    Patient Care Team: Binnie Rail, MD as PCP - General (Internal Medicine) [place], Peter M, MD as Referring Physician (Cardiology) Newt Lukes, AUD (Audiology) Lyndal Pulley, DO as Consulting Physician (Family Medicine)  Indicate any recent Medical Services you may  have received from other than [facility] providers in the past year (date may be approximate).     Assessment:   This is a routine wellness examination for Terryl.  Hearing/Vision screen No exam data present  Dietary issues and exercise activities discussed: Current Exercise Habits: The patient does not participate in regular exercise at present, Exercise limited by: None identified  Goals    .  lose 10 pounds      Start walk in the neighborhood and go to the [facility], cut down on starches, use the steps [place] the ballpark.     .  Patient Stated      Travel more within the [place]. Try to eat as healthy as possible. Stay active by working at the [employer].    .  Patient Stated (pt-stated)      To get rid of the swelling or gout in right leg      Depression Screen PHQ 2/9 Scores 08/15/2019 04/02/2019 03/27/2018 03/27/2018 12/04/2016 06/01/2016 04/06/2016  PHQ - 2 Score 0 0 0 0 0 0 0  PHQ- 9 Score - - - - - 0 -    Fall Risk Fall Risk  08/15/2019 04/02/2019 03/27/2018 03/27/2018 12/04/2016  Falls in the past year? 0 0 1 0 No  Number falls in past yr: 0 0 0 - -  Injury with Fall? 0 0 1 - -  Risk for fall due to : No Fall Risks - - - -  Follow up Falls evaluation completed - Falls prevention discussed;Education provided - -    Any stairs in or around the home? Yes  If so, are there any without handrails? No  Home free of loose throw rugs in walkways, pet beds, electrical cords, etc? Yes  Adequate lighting in your home to reduce risk of falls? Yes   ASSISTIVE DEVICES UTILIZED TO PREVENT FALLS:  Life alert? No  Use of a cane, walker or w/c? No  Grab bars in the bathroom? No  Shower chair or bench in shower? No  Elevated toilet seat or a handicapped toilet? No   TIMED UP AND GO:  Was the test performed? No .  Length of time to ambulate 10 feet: 0 sec.   Gait steady and fast without use of assistive device  Cognitive Function:     6CIT Screen 08/15/2019  What Year? 0 points  What month? 0  points  What time? 3 points  Count back from 20 0 points  Months in reverse 0 points  Repeat phrase 0 points  Total Score 3    Immunizations Immunization History  Administered Date(s) Administered  . Fluad Quad(high Dose 65+) 10/17/2018  . Influenza Split 09/30/2013  . Influenza Whole 10/21/2007, 10/31/2010, 10/31/2011  . Influenza, High Dose Seasonal PF 11/12/2013, 11/24/2014, 11/18/2015, 11/23/2016  . Influenza-Unspecified 09/30/2012, 11/24/2014, 11/18/2015, 11/08/2017  . PFIZER SARS-COV-2 Vaccination 02/19/2019, 03/12/2019  . Pneumococcal Conjugate-13 11/12/2013, 03/05/2015  . Pneumococcal Polysaccharide-23 11/23/2016  . Pneumococcal-Unspecified 01/21/2010  . Tdap 12/03/2015, 12/23/2016  . Zoster 03/25/2007    TDAP status: Up to date Flu Vaccine status: Up to date Pneumococcal vaccine status: Up to date Covid-19 vaccine status: Completed vaccines  Qualifies for Shingles Vaccine? Yes   Zostavax completed Yes   Shingrix Completed?: No.    Education has been provided regarding the importance of this vaccine. Patient has been advised to call insurance company to determine out of pocket expense if they have not yet received this vaccine. Advised may also receive vaccine at local pharmacy or Health Dept. Verbalized acceptance and understanding.  Screening Tests Health Maintenance  Topic Date Due  . INFLUENZA VACCINE  08/31/2019  . TETANUS/TDAP  12/24/2026  . COVID-19 Vaccine  Completed  . Hepatitis C Screening  Completed  . PNA vac Low Risk Adult  Completed    Health Maintenance  There are no preventive care reminders to display for this patient.  Colorectal cancer screening: Completed 03/30/2011. Repeat every 5 years  Lung Cancer Screening: (Low Dose CT Chest recommended if Age 21-80 years, 30 pack-year currently smoking OR have quit w/in 15years.) does not qualify.   Lung Cancer Screening Referral: no  Additional Screening:  Hepatitis C Screening: does qualify;  Completed yes  Vision Screening: Recommended annual ophthalmology exams for early detection of glaucoma and other disorders of the eye. Is the patient up to date with their annual eye exam?  Yes  Who is the provider or what is the name of the office in which the patient attends annual eye exams? [facility] If pt is not established with a provider, would they like to be referred to a provider to establish care? No .   Dental Screening: Recommended annual dental exams for proper oral hygiene  Community Resource Referral / Chronic Care Management: CRR required this visit?  No   CCM required this visit?  No      Plan:     I have personally reviewed and noted the following in the patient's chart:   . Medical and social history . Use of alcohol, tobacco or illicit drugs  . Current medications and supplements . Functional ability and status . Nutritional status . Physical activity . Advanced directives . List of other physicians . Hospitalizations, surgeries, and ER visits in previous 12 months . Vitals . Screenings to include cognitive, depression, and falls . Referrals and appointments  In addition, I have reviewed and discussed with patient certain preventive protocols, quality metrics, and best practice recommendations. A written personalized  care plan for preventive services as well as general preventive health recommendations were provided to patient.     Samuel Flow, LPN   [id number]   Nurse Notes:

## 2019-08-15 NOTE — Patient Instructions (Signed)
Samuel Moyer , Thank you for taking time to come for your Medicare Wellness Visit. I appreciate your ongoing commitment to your health goals. Please review the following plan we discussed and let me know if I can assist you in the future.   Screening recommendations/referrals: Colonoscopy: 03/30/2011; no repeat due to age Recommended yearly ophthalmology/optometry visit for glaucoma screening and checkup Recommended yearly dental visit for hygiene and checkup  Vaccinations: Influenza vaccine: 10/17/2018 Pneumococcal vaccine: completed Tdap vaccine: 12/23/2016; due every 10 years Shingles vaccine: never done   Covid-19: completed  Advanced directives: Please bring a copy of your health care power of attorney and living will to the office at your convenience.  Conditions/risks identified: Yes; Please continue to do your personal lifestyle choices by: daily care of teeth and gums, regular physical activity (goal should be 5 days a week for 30 minutes), eat a healthy diet, avoid tobacco and drug use, limiting any alcohol intake, taking a low-dose aspirin (if not allergic or have been advised by your provider otherwise) and taking vitamins and minerals as recommended by your provider. Continue doing brain stimulating activities (puzzles, reading, adult coloring books, staying active) to keep memory sharp. Continue to eat heart healthy diet (full of fruits, vegetables, whole grains, lean protein, water--limit salt, fat, and sugar intake) and increase physical activity as tolerated.  Next appointment: Please schedule your next Medicare Wellness Visit with your Nurse Health Advisor in 1 year.  Preventive Care 28 Years and Older, Male Preventive care refers to lifestyle choices and visits with your health care provider that can promote health and wellness. What does preventive care include?  A yearly physical exam. This is also called an annual well check.  Dental exams once or twice a  year.  Routine eye exams. Ask your health care provider how often you should have your eyes checked.  Personal lifestyle choices, including:  Daily care of your teeth and gums.  Regular physical activity.  Eating a healthy diet.  Avoiding tobacco and drug use.  Limiting alcohol use.  Practicing safe sex.  Taking low doses of aspirin every day.  Taking vitamin and mineral supplements as recommended by your health care provider. What happens during an annual well check? The services and screenings done by your health care provider during your annual well check will depend on your age, overall health, lifestyle risk factors, and family history of disease. Counseling  Your health care provider may ask you questions about your:  Alcohol use.  Tobacco use.  Drug use.  Emotional well-being.  Home and relationship well-being.  Sexual activity.  Eating habits.  History of falls.  Memory and ability to understand (cognition).  Work and work Statistician. Screening  You may have the following tests or measurements:  Height, weight, and BMI.  Blood pressure.  Lipid and cholesterol levels. These may be checked every 5 years, or more frequently if you are over 1 years old.  Skin check.  Lung cancer screening. You may have this screening every year starting at age 41 if you have a 30-pack-year history of smoking and currently smoke or have quit within the past 15 years.  Fecal occult blood test (FOBT) of the stool. You may have this test every year starting at age 50.  Flexible sigmoidoscopy or colonoscopy. You may have a sigmoidoscopy every 5 years or a colonoscopy every 10 years starting at age 33.  Prostate cancer screening. Recommendations will vary depending on your family history and other risks.  Hepatitis  C blood test.  Hepatitis B blood test.  Sexually transmitted disease (STD) testing.  Diabetes screening. This is done by checking your blood sugar  (glucose) after you have not eaten for a while (fasting). You may have this done every 1-3 years.  Abdominal aortic aneurysm (AAA) screening. You may need this if you are a current or former smoker.  Osteoporosis. You may be screened starting at age 86 if you are at high risk. Talk with your health care provider about your test results, treatment options, and if necessary, the need for more tests. Vaccines  Your health care provider may recommend certain vaccines, such as:  Influenza vaccine. This is recommended every year.  Tetanus, diphtheria, and acellular pertussis (Tdap, Td) vaccine. You may need a Td booster every 10 years.  Zoster vaccine. You may need this after age 92.  Pneumococcal 13-valent conjugate (PCV13) vaccine. One dose is recommended after age 22.  Pneumococcal polysaccharide (PPSV23) vaccine. One dose is recommended after age 2. Talk to your health care provider about which screenings and vaccines you need and how often you need them. This information is not intended to replace advice given to you by your health care provider. Make sure you discuss any questions you have with your health care provider. Document Released: 02/12/2015 Document Revised: 10/06/2015 Document Reviewed: 11/17/2014 Elsevier Interactive Patient Education  2017 [place] Prevention in the Home Falls can cause injuries. They can happen to people of all ages. There are many things you can do to make your home safe and to help prevent falls. What can I do on the outside of my home?  Regularly fix the edges of walkways and driveways and fix any cracks.  Remove anything that might make you trip as you walk through a door, such as a raised step or threshold.  Trim any bushes or trees on the path to your home.  Use bright outdoor lighting.  Clear any walking paths of anything that might make someone trip, such as rocks or tools.  Regularly check to see if handrails are loose or  broken. Make sure that both sides of any steps have handrails.  Any raised decks and porches should have guardrails on the edges.  Have any leaves, snow, or ice cleared regularly.  Use sand or salt on walking paths during winter.  Clean up any spills in your garage right away. This includes oil or grease spills. What can I do in the bathroom?  Use night lights.  Install grab bars by the toilet and in the tub and shower. Do not use towel bars as grab bars.  Use non-skid mats or decals in the tub or shower.  If you need to sit down in the shower, use a plastic, non-slip stool.  Keep the floor dry. Clean up any water that spills on the floor as soon as it happens.  Remove soap buildup in the tub or shower regularly.  Attach bath mats securely with double-sided non-slip rug tape.  Do not have throw rugs and other things on the floor that can make you trip. What can I do in the bedroom?  Use night lights.  Make sure that you have a light by your bed that is easy to reach.  Do not use any sheets or blankets that are too big for your bed. They should not hang down onto the floor.  Have a firm chair that has side arms. You can use this for support while you get  dressed.  Do not have throw rugs and other things on the floor that can make you trip. What can I do in the kitchen?  Clean up any spills right away.  Avoid walking on wet floors.  Keep items that you use a lot in easy-to-reach places.  If you need to reach something above you, use a strong step stool that has a grab bar.  Keep electrical cords out of the way.  Do not use floor polish or wax that makes floors slippery. If you must use wax, use non-skid floor wax.  Do not have throw rugs and other things on the floor that can make you trip. What can I do with my stairs?  Do not leave any items on the stairs.  Make sure that there are handrails on both sides of the stairs and use them. Fix handrails that are  broken or loose. Make sure that handrails are as long as the stairways.  Check any carpeting to make sure that it is firmly attached to the stairs. Fix any carpet that is loose or worn.  Avoid having throw rugs at the top or bottom of the stairs. If you do have throw rugs, attach them to the floor with carpet tape.  Make sure that you have a light switch at the top of the stairs and the bottom of the stairs. If you do not have them, ask someone to add them for you. What else can I do to help prevent falls?  Wear shoes that:  Do not have high heels.  Have rubber bottoms.  Are comfortable and fit you well.  Are closed at the toe. Do not wear sandals.  If you use a stepladder:  Make sure that it is fully opened. Do not climb a closed stepladder.  Make sure that both sides of the stepladder are locked into place.  Ask someone to hold it for you, if possible.  Clearly mark and make sure that you can see:  Any grab bars or handrails.  First and last steps.  Where the edge of each step is.  Use tools that help you move around (mobility aids) if they are needed. These include:  Canes.  Walkers.  Scooters.  Crutches.  Turn on the lights when you go into a dark area. Replace any light bulbs as soon as they burn out.  Set up your furniture so you have a clear path. Avoid moving your furniture around.  If any of your floors are uneven, fix them.  If there are any pets around you, be aware of where they are.  Review your medicines with your doctor. Some medicines can make you feel dizzy. This can increase your chance of falling. Ask your doctor what other things that you can do to help prevent falls. This information is not intended to replace advice given to you by your health care provider. Make sure you discuss any questions you have with your health care provider. Document Released: 11/12/2008 Document Revised: 06/24/2015 Document Reviewed: 02/20/2014 Elsevier  Interactive Patient Education  2017 [employer].

## 2019-08-20 ENCOUNTER — Other Ambulatory Visit: Payer: Self-pay | Admitting: Cardiology

## 2019-08-25 DIAGNOSIS — H401131 Primary open-angle glaucoma, bilateral, mild stage: Secondary | ICD-10-CM | POA: Diagnosis not present

## 2019-09-23 ENCOUNTER — Other Ambulatory Visit: Payer: Self-pay | Admitting: Internal Medicine

## 2019-10-08 ENCOUNTER — Ambulatory Visit (INDEPENDENT_AMBULATORY_CARE_PROVIDER_SITE_OTHER): Payer: Medicare Other | Admitting: Internal Medicine

## 2019-10-08 ENCOUNTER — Encounter: Payer: Self-pay | Admitting: Internal Medicine

## 2019-10-08 ENCOUNTER — Other Ambulatory Visit: Payer: Self-pay

## 2019-10-08 VITALS — BP 140/82 | HR 43 | Temp 98.0°F | Wt 208.0 lb

## 2019-10-08 DIAGNOSIS — L03115 Cellulitis of right lower limb: Secondary | ICD-10-CM

## 2019-10-08 DIAGNOSIS — G589 Mononeuropathy, unspecified: Secondary | ICD-10-CM | POA: Diagnosis not present

## 2019-10-08 DIAGNOSIS — L97911 Non-pressure chronic ulcer of unspecified part of right lower leg limited to breakdown of skin: Secondary | ICD-10-CM | POA: Insufficient documentation

## 2019-10-08 MED ORDER — MUPIROCIN 2 % EX OINT: 1.0000 "application " | TOPICAL_OINTMENT | Freq: Two times a day (BID) | CUTANEOUS | 0 refills | Status: DC

## 2019-10-08 MED ORDER — AMOXICILLIN-POT CLAVULANATE 875-125 MG PO TABS
1.0000 | ORAL_TABLET | Freq: Two times a day (BID) | ORAL | 0 refills | Status: DC
Start: 2019-10-08 — End: 2019-10-18

## 2019-10-08 NOTE — Progress Notes (Signed)
Subjective:    Patient ID: Samuel Moyer., male    DOB: [birth year], 79 y.o.   MRN: [id number]  HPI The patient is here for an acute visit.  Right lower anterior leg wound.  He was here in July and had poison ivy and mosquito bite on his bilateral lower legs.  His right lower leg became swollen and red and there were blisters that had popped and were draining.  At that time I treated him for cellulitis.  He states now that the wound never closed up in a couple of weeks ago he had a bandage that stuck to the area above the wound and ripped off some skin and now the open wound is larger.  There is no bleeding or discharge.  He has been applying triple antibiotic ointment and keeping it covered, but was concerned because it was not healing.  He has not had any fevers or chills.  Today he noticed right chin numbness.  He noticed this when he was shaving.  He denies any other numbness throughout his face or inside his mouth.  He did have a dental cleaning recently, but no injections or dental work.  He did start using a new CPAP mask recently and he states it is tight around his chin and jaw.  He denies any injury to the area.      Medications and allergies reviewed with patient and updated if appropriate.  Patient Active Problem List   Diagnosis Date Noted  . Poison ivy dermatitis 08/08/2019  . Cellulitis of leg, right 08/08/2019  . Physical deconditioning 04/30/2019  . Coughing 04/02/2019  . Dyspnea on exertion 11/01/2018  . Strain of left quadriceps 02/22/2018  . 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 Acute pain of right knee 12/25/2017  . Fall 12/27/2016  . Gout 06/03/2015  . Prediabetes 03/05/2015  . Obesity 12/28/2014  . Venous (peripheral) insufficiency 02/16/2014  . 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 ATRIAL FIBRILLATION  01/26/2010  . Chronic systolic heart failure ([place]) 01/26/2010  . Asthma 01/14/2010  . Hypercholesterolemia 10/21/2007  . Anxiety 10/21/2007  .  Essential hypertension 02/26/2006  . COLONIC POLYPS, HX OF 02/26/2006    Current Outpatient Medications on File Prior to Visit  Medication Sig Dispense Refill  . allopurinol (ZYLOPRIM) 300 MG tablet Take 1 tablet (300 mg total) by mouth daily. To lower uric acid for gout 90 tablet 1  . amLODipine (NORVASC) 5 MG tablet TAKE ONE AND ONE-HALF TABLETS DAILY (NEED TO SCHEDULE APPOINTMENT) 135 tablet 3  . atorvastatin (LIPITOR) 10 MG tablet Take 1 tablet (10 mg total) by mouth daily. Annual appt w/labs due in Sept must see provider for future refills 90 tablet 0  . atorvastatin (LIPITOR) 10 MG tablet Take 1 tablet (10 mg total) by mouth daily. 90 tablet 1  . benazepril (LOTENSIN) 40 MG tablet TAKE 1 TABLET DAILY 90 tablet 1  . clonazePAM (KLONOPIN) 0.5 MG tablet TAKE 1 TABLET(0.5 MG) BY MOUTH TWICE DAILY 60 tablet 2  . colchicine 0.6 MG tablet Take 1 tablet (0.6 mg total) by mouth daily. To prevent gout flair 90 tablet 1  . dabigatran (PRADAXA) 150 MG CAPS capsule TAKE 1 CAPSULE EVERY 12 HOURS 180 capsule 3  . furosemide (LASIX) 40 MG tablet Take 40 mg in the morning and 20 mg in the afternoon 145 tablet 3  . LUMIGAN 0.01 % SOLN Place 1 drop into both eyes at bedtime.     . metoprolol tartrate (  LOPRESSOR) 25 MG tablet TAKE 2 TABLETS TWICE A DAY (NEED OFFICE VISIT) (SCHEDULE AN APPOINTMENT FOR FUTURE REFILLS) 360 tablet 3  . Multiple Vitamin (MULTIVITAMIN) tablet Take 1 tablet by mouth daily.      [name] DELICA LANCETS 50D MISC 1 each by Other route daily. Check blood sugar daily as directed (Patient taking differently: 1 each by Other route daily. Check blood sugar PRN as directed) 100 each 0  . PARoxetine (PAXIL) 20 MG tablet TAKE ONE-HALF (1/2) TABLET DAILY 45 tablet 1  . omeprazole (PRILOSEC) 20 MG capsule Take 1 capsule (20 mg total) by mouth daily. Take 30 minutes prior to a meal (Patient not taking: Reported on 10/08/2019) 90 capsule 1   No current facility-administered medications on file  prior to visit.    Past Medical History:  Diagnosis Date  . A-fib ([place])   . Anxiety   . Atrial fibrillation ([place])   . CHF (congestive heart failure) ([place])   . Claustrophobia    Occasionally when flying   . Colitis   . Diabetes mellitus, type 2 ([place])   . Fracture of one rib, left side, initial encounter for closed fracture 12/27/2016   Occurred 12/21/16 after a fall at home.  Left anterior seventh rib  . Gout   . Hemorrhoids   . Hyperlipidemia   . Hypertension   . LV dysfunction    EF 40-45%  . OSA (obstructive sleep apnea)    CPAP machine   . PVC's (premature ventricular contractions)     Past Surgical History:  Procedure Laterality Date  . CARDIOVASCULAR STRESS TEST  03/02/2010   EF 50%  . US ECHOCARDIOGRAPHY  11/15/2009   EF 40-45%    Social History   Socioeconomic History  . Marital status: Married    Spouse name: Not on file  . Number of children: 3  . Years of education: Not on file  . Highest education level: Not on file  Occupational History  . Occupation: Retired    Fish farm manager: RETIRED    Comment: [employer]   Tobacco Use  . Smoking status: Former Smoker    Packs/day: 1.00    Years: 16.00    Pack years: 16.00    Types: Cigarettes    Quit date: 06/30/1977    Years since quitting: 42.3  . Smokeless tobacco: Never Used  Vaping Use  . Vaping Use: Never used  Substance and Sexual Activity  . Alcohol use: No  . Drug use: No  . Sexual activity: Not on file  Other Topics Concern  . Not on file  Social History Narrative   2 caffeine drinks daily    Social Determinants of Health   Financial Resource Strain: Low Risk   . Difficulty of Paying Living Expenses: Not hard at all  Food Insecurity: No Food Insecurity  . Worried About Charity fundraiser in the Last Year: Never true  . Ran Out of Food in the Last Year: Never true  Transportation Needs: No Transportation Needs  . Lack of Transportation (Medical): No  . Lack of Transportation (Non-Medical):  No  Physical Activity: Inactive  . Days of Exercise per Week: 0 days  . Minutes of Exercise per Session: 0 min  Stress: No Stress Concern Present  . Feeling of Stress : Not at all  Social Connections:   . Frequency of Communication with Friends and Family: Not on file  . Frequency of Social Gatherings with Friends and Family: Not on file  . Attends  Religious Services: Not on file  . Active Member of Clubs or Organizations: Not on file  . Attends Archivist Meetings: Not on file  . Marital Status: Not on file    Family History  Problem Relation Age of Onset  . Hypertension Father   . Heart attack Father        Age 51 (MI)  . Heart failure Father   . Colonic polyp Sister        and Father  . Stroke Mother   . Colon cancer Neg Hx   . Stomach cancer Neg Hx     Review of Systems     Objective:   Vitals:   10/08/19 1448  BP: 140/82  Pulse: (!) 43  Temp: 98 F (36.7 C)  SpO2: 94%   BP Readings from Last 3 Encounters:  10/08/19 140/82  08/15/19 138/78  08/08/19 140/82   Wt Readings from Last 3 Encounters:  10/08/19 208 lb (94.3 kg)  08/15/19 212 lb 6.4 oz (96.3 kg)  08/08/19 216 lb (98 kg)   Body mass index is 31.63 kg/m.   Physical Exam Constitutional:      General: He is not in acute distress.    Appearance: Normal appearance. He is not ill-appearing.  HENT:     Head: Normocephalic and atraumatic.     Mouth/Throat:     Mouth: Mucous membranes are moist.     Pharynx: No posterior oropharyngeal erythema.  Skin:    General: Skin is warm and dry.     Findings: Lesion (ulcer R ant lower leg-mild erythema surrounding ulcer, slight warmth, slight tenderness, no bleeding or discharge from ulcer) present.  Neurological:     Cranial Nerves: No cranial nerve deficit (grossly intact).     Sensory: Sensory deficit (right chin only) present.     Motor: No weakness.              Assessment & Plan:    See Problem List for Assessment and Plan of  chronic medical problems.    This visit occurred during the SARS-CoV-2 public health emergency.  Safety protocols were in place, including screening questions prior to the visit, additional usage of staff PPE, and extensive cleaning of exam room while observing appropriate contact time as indicated for disinfecting solutions.

## 2019-10-08 NOTE — Assessment & Plan Note (Signed)
Acute Mental nerve compression or impingement resulting in right shin numbness He noticed this today ?  Related to new CPAP mask Had recent dental cleaning, but no other dental work He does need to follow-up with sleep medicine and will discuss with them that this could be related to his CPAP mask Has follow-up with me in a couple of weeks-if symptoms still present may need to consider imaging to rule out other causes

## 2019-10-08 NOTE — Patient Instructions (Addendum)
   Medications reviewed and updated.  Changes include :   Start Augmentin   Your prescription(s) have been submitted to your pharmacy. Please take as directed and contact our office if you believe you are having problem(s) with the medication(s).  A referral was ordered for wound clinic.       Someone from their office will call you to schedule an appointment.

## 2019-10-08 NOTE — Assessment & Plan Note (Signed)
Acute Ulcer present for least 2 months-worse recently after skin removed with Band-Aid Not healing appropriately Will refer to wound care for further evaluation

## 2019-10-08 NOTE — Assessment & Plan Note (Signed)
Acute Concern for mild cellulitis of right lower extremity surrounding ulcer Will prescribe Augmentin twice daily x10 days Will refer to wound clinic for further evaluation of nonhealing ulcer Continue wound care-Bactroban, nonstick pad

## 2019-10-18 NOTE — Progress Notes (Signed)
Subjective:    Patient ID: Samuel Moyer., male    DOB: 12/22/1940, 79 y.o.   MRN: [id number]  HPI The patient is here for follow up of their chronic medical problems, including prediabetes, htn, hypercholesterolemia, R leg wound/ cellulitis, anxiety, gout   He goes to the wound clinic tomorrow.  The wound is much improved and no longer an open ulcer.  He thinks the antibiotic did help.  His chin is no longer numb - he did rework his cpap mask.  He denies gout symptoms.  Medications and allergies reviewed with patient and updated if appropriate.  Patient Active Problem List   Diagnosis Date Noted  . Aortic atherosclerosis ([place]) 10/20/2019  . Ulcer of right lower extremity, limited to breakdown of skin ([place]) 10/08/2019  . Nerve disorder 10/08/2019  . Cellulitis of leg, right 08/08/2019  . Physical deconditioning 04/30/2019  . Coughing 04/02/2019  . Dyspnea on exertion 11/01/2018  . Strain of left quadriceps 02/22/2018  . 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 Acute pain of right knee 12/25/2017  . Fall 12/27/2016  . Gout 06/03/2015  . Prediabetes 03/05/2015  . Obesity 12/28/2014  . Venous (peripheral) insufficiency 02/16/2014  . 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 ATRIAL FIBRILLATION  01/26/2010  . Chronic systolic heart failure ([place]) 01/26/2010  . Asthma 01/14/2010  . Hypercholesterolemia 10/21/2007  . Anxiety 10/21/2007  . Essential hypertension 02/26/2006  . COLONIC POLYPS, HX OF 02/26/2006    Current Outpatient Medications on File Prior to Visit  Medication Sig Dispense Refill  . allopurinol (ZYLOPRIM) 300 MG tablet Take 1 tablet (300 mg total) by mouth daily. To lower uric acid for gout 90 tablet 1  . amLODipine (NORVASC) 5 MG tablet TAKE ONE AND ONE-HALF TABLETS DAILY (NEED TO SCHEDULE APPOINTMENT) 135 tablet 3  . atorvastatin (LIPITOR) 10 MG tablet Take 1 tablet (10 mg total) by mouth daily. 90 tablet 1  . benazepril (LOTENSIN) 40 MG tablet TAKE 1  TABLET DAILY 90 tablet 1  . clonazePAM (KLONOPIN) 0.5 MG tablet TAKE 1 TABLET(0.5 MG) BY MOUTH TWICE DAILY 60 tablet 2  . colchicine 0.6 MG tablet Take 1 tablet (0.6 mg total) by mouth daily. To prevent gout flair 90 tablet 1  . dabigatran (PRADAXA) 150 MG CAPS capsule TAKE 1 CAPSULE EVERY 12 HOURS 180 capsule 3  . furosemide (LASIX) 40 MG tablet Take 40 mg in the morning and 20 mg in the afternoon 145 tablet 3  . LUMIGAN 0.01 % SOLN Place 1 drop into both eyes at bedtime.     . metoprolol tartrate (LOPRESSOR) 25 MG tablet TAKE 2 TABLETS TWICE A DAY (NEED OFFICE VISIT) (SCHEDULE AN APPOINTMENT FOR FUTURE REFILLS) 360 tablet 3  . Multiple Vitamin (MULTIVITAMIN) tablet Take 1 tablet by mouth daily.      . mupirocin ointment (BACTROBAN) 2 % Apply 1 application topically 2 (two) times daily. 30 g 0  . omeprazole (PRILOSEC) 20 MG capsule Take 1 capsule (20 mg total) by mouth daily. Take 30 minutes prior to a meal 90 capsule 1  . ONETOUCH DELICA LANCETS 51O MISC 1 each by Other route daily. Check blood sugar daily as directed (Patient taking differently: 1 each by Other route daily. Check blood sugar PRN as directed) 100 each 0  . PARoxetine (PAXIL) 20 MG tablet TAKE ONE-HALF (1/2) TABLET DAILY 45 tablet 1   No current facility-administered medications on file prior to visit.    Past Medical History:  Diagnosis Date  .  A-fib ([place])   . Anxiety   . Atrial fibrillation ([place])   . CHF (congestive heart failure) ([place])   . Claustrophobia    Occasionally when flying   . Colitis   . Diabetes mellitus, type 2 ([place])   . Fracture of one rib, left side, initial encounter for closed fracture 12/27/2016   Occurred 12/21/16 after a fall at home.  Left anterior seventh rib  . Gout   . Hemorrhoids   . Hyperlipidemia   . Hypertension   . LV dysfunction    EF 40-45%  . OSA (obstructive sleep apnea)    CPAP machine   . PVC's (premature ventricular contractions)     Past Surgical History:  Procedure  Laterality Date  . CARDIOVASCULAR STRESS TEST  03/02/2010   EF 50%  . US ECHOCARDIOGRAPHY  11/15/2009   EF 40-45%    Social History   Socioeconomic History  . Marital status: Married    Spouse name: Not on file  . Number of children: 3  . Years of education: Not on file  . Highest education level: Not on file  Occupational History  . Occupation: Retired    Fish farm manager: RETIRED    Comment: [employer]   Tobacco Use  . Smoking status: Former Smoker    Packs/day: 1.00    Years: 16.00    Pack years: 16.00    Types: Cigarettes    Quit date: 06/30/1977    Years since quitting: 42.3  . Smokeless tobacco: Never Used  Vaping Use  . Vaping Use: Never used  Substance and Sexual Activity  . Alcohol use: No  . Drug use: No  . Sexual activity: Not on file  Other Topics Concern  . Not on file  Social History Narrative   2 caffeine drinks daily    Social Determinants of Health   Financial Resource Strain: Low Risk   . Difficulty of Paying Living Expenses: Not hard at all  Food Insecurity: No Food Insecurity  . Worried About Charity fundraiser in the Last Year: Never true  . Ran Out of Food in the Last Year: Never true  Transportation Needs: No Transportation Needs  . Lack of Transportation (Medical): No  . Lack of Transportation (Non-Medical): No  Physical Activity: Inactive  . Days of Exercise per Week: 0 days  . Minutes of Exercise per Session: 0 min  Stress: No Stress Concern Present  . Feeling of Stress : Not at all  Social Connections:   . Frequency of Communication with Friends and Family: Not on file  . Frequency of Social Gatherings with Friends and Family: Not on file  . Attends Religious Services: Not on file  . Active Member of Clubs or Organizations: Not on file  . Attends Archivist Meetings: Not on file  . Marital Status: Not on file    Family History  Problem Relation Age of Onset  . Hypertension Father   . Heart attack Father        Age 50  (MI)  . Heart failure Father   . Colonic polyp Sister        and Father  . Stroke Mother   . Colon cancer Neg Hx   . Stomach cancer Neg Hx     Review of Systems  Constitutional: Negative for chills and fever.  Respiratory: Positive for shortness of breath (double stairs). Negative for cough and wheezing.   Cardiovascular: Positive for leg swelling (mild at times). Negative for chest pain  and palpitations.  Neurological: Negative for light-headedness and headaches.       Objective:   Vitals:   10/20/19 0954  BP: 128/72  Pulse: (!) 55  Temp: 98.4 F (36.9 C)  SpO2: 93%   BP Readings from Last 3 Encounters:  10/20/19 128/72  10/08/19 140/82  08/15/19 138/78   Wt Readings from Last 3 Encounters:  10/20/19 209 lb 6.4 oz (95 kg)  10/08/19 208 lb (94.3 kg)  08/15/19 212 lb 6.4 oz (96.3 kg)   Body mass index is 31.84 kg/m.   Physical Exam    Constitutional: Appears well-developed and well-nourished. No distress.  HENT:  Head: Normocephalic and atraumatic.  Neck: Neck supple. No tracheal deviation present. No thyromegaly present.  No cervical lymphadenopathy Cardiovascular: Normal rate, regular rhythm and normal heart sounds.   No murmur heard. No carotid bruit .  No edema in LLE.  Mild 1+ edema in RLE Pulmonary/Chest: Effort normal and breath sounds normal. No respiratory distress. No has no wheezes. No rales.  Skin: Skin is warm and dry. Not diaphoretic.  Right lower anterior leg wound is almost completely healed.  No swelling, erythema or pain Psychiatric: Normal mood and affect. Behavior is normal.      Assessment & Plan:    See Problem List for Assessment and Plan of chronic medical problems.    This visit occurred during the SARS-CoV-2 public health emergency.  Safety protocols were in place, including screening questions prior to the visit, additional usage of staff PPE, and extensive cleaning of exam room while observing appropriate contact time as  indicated for disinfecting solutions.

## 2019-10-18 NOTE — Patient Instructions (Addendum)
  Blood work was ordered.     Flu immunization administered today.     Medications reviewed and updated.  Changes include :     Stop colchicine daily - take only as needed for gout flare.    Your prescription(s) have been submitted to your pharmacy. Please take as directed and contact our office if you believe you are having problem(s) with the medication(s).    Please followup in 6 months

## 2019-10-20 ENCOUNTER — Other Ambulatory Visit: Payer: Self-pay

## 2019-10-20 ENCOUNTER — Encounter: Payer: Self-pay | Admitting: Internal Medicine

## 2019-10-20 ENCOUNTER — Ambulatory Visit (INDEPENDENT_AMBULATORY_CARE_PROVIDER_SITE_OTHER): Payer: Medicare Other | Admitting: Internal Medicine

## 2019-10-20 VITALS — BP 128/72 | HR 55 | Temp 98.4°F | Wt 209.4 lb

## 2019-10-20 DIAGNOSIS — M1A0790 Idiopathic chronic gout, unspecified ankle and foot, without tophus (tophi): Secondary | ICD-10-CM | POA: Diagnosis not present

## 2019-10-20 DIAGNOSIS — I700 Atherosclerosis of aorta: Secondary | ICD-10-CM

## 2019-10-20 DIAGNOSIS — I10 Essential (primary) hypertension: Secondary | ICD-10-CM | POA: Diagnosis not present

## 2019-10-20 DIAGNOSIS — Z23 Encounter for immunization: Secondary | ICD-10-CM

## 2019-10-20 DIAGNOSIS — R7303 Prediabetes: Secondary | ICD-10-CM

## 2019-10-20 DIAGNOSIS — E7800 Pure hypercholesterolemia, unspecified: Secondary | ICD-10-CM

## 2019-10-20 DIAGNOSIS — L03115 Cellulitis of right lower limb: Secondary | ICD-10-CM

## 2019-10-20 DIAGNOSIS — F419 Anxiety disorder, unspecified: Secondary | ICD-10-CM | POA: Diagnosis not present

## 2019-10-20 MED ORDER — COLCHICINE 0.6 MG PO TABS
ORAL_TABLET | ORAL | 1 refills | Status: DC
Start: 2019-10-20 — End: 2020-06-07

## 2019-10-20 NOTE — Assessment & Plan Note (Signed)
Resolved after antibiotics Ulcer almost completely healed Does have wound clinic appointment tomorrow-advised that he does go since he has the appointment

## 2019-10-20 NOTE — Assessment & Plan Note (Signed)
Chronic On lipitor 10 mg daily Check lipid panel  Regular exercise, weight loss and healthy diet encouraged

## 2019-10-20 NOTE — Assessment & Plan Note (Addendum)
Chronic Denies gout symptoms Taking allopurinol daily Can take colchicine prn, stop daily colchicine Check uric acid level

## 2019-10-20 NOTE — Assessment & Plan Note (Signed)
Chronic Check lipid panel  Continue daily statin Regular exercise and healthy diet encouraged  

## 2019-10-20 NOTE — Assessment & Plan Note (Signed)
Chronic BP well controlled Current regimen effective and well tolerated Continue current medications at current doses cmp  

## 2019-10-20 NOTE — Assessment & Plan Note (Signed)
Chronic Controlled, stable Continue current dose of medication paxil 20 mg daily Clonazepam twice daily

## 2019-10-20 NOTE — Assessment & Plan Note (Signed)
Chronic Check a1c Low sugar / carb diet Stressed regular exercise  

## 2019-10-21 ENCOUNTER — Encounter (HOSPITAL_BASED_OUTPATIENT_CLINIC_OR_DEPARTMENT_OTHER): Payer: Medicare Other | Attending: Internal Medicine | Admitting: Internal Medicine

## 2019-10-21 DIAGNOSIS — I5040 Unspecified combined systolic (congestive) and diastolic (congestive) heart failure: Secondary | ICD-10-CM | POA: Insufficient documentation

## 2019-10-21 DIAGNOSIS — I872 Venous insufficiency (chronic) (peripheral): Secondary | ICD-10-CM | POA: Diagnosis not present

## 2019-10-21 DIAGNOSIS — I110 Hypertensive heart disease with heart failure: Secondary | ICD-10-CM | POA: Insufficient documentation

## 2019-10-21 DIAGNOSIS — L97218 Non-pressure chronic ulcer of right calf with other specified severity: Secondary | ICD-10-CM | POA: Insufficient documentation

## 2019-10-21 DIAGNOSIS — Z87891 Personal history of nicotine dependence: Secondary | ICD-10-CM | POA: Diagnosis not present

## 2019-10-21 DIAGNOSIS — R7303 Prediabetes: Secondary | ICD-10-CM | POA: Insufficient documentation

## 2019-10-21 LAB — COMPLETE METABOLIC PANEL WITH GFR
AG Ratio: 1.7 (calc) (ref 1.0–2.5)
ALT: 10 U/L (ref 9–46)
AST: 15 U/L (ref 10–35)
Albumin: 4.1 g/dL (ref 3.6–5.1)
Alkaline phosphatase (APISO): 82 U/L (ref 35–144)
BUN: 16 mg/dL (ref 7–25)
CO2: 27 mmol/L (ref 20–32)
Calcium: 9.5 mg/dL (ref 8.6–10.3)
Chloride: 105 mmol/L (ref 98–110)
Creat: 1.08 mg/dL (ref 0.70–1.11)
GFR, Est African American: 75 mL/min/{1.73_m2} (ref >=60)
GFR, Est Non African American: 64 mL/min/{1.73_m2} (ref >=60)
Globulin: 2.4 g/dL (calc) (ref 1.9–3.7)
Glucose, Bld: 113 mg/dL — ABNORMAL HIGH (ref 65–99)
Potassium: 4.2 mmol/L (ref 3.5–5.3)
Sodium: 141 mmol/L (ref 135–146)
Total Bilirubin: 1 mg/dL (ref 0.2–1.2)
Total Protein: 6.5 g/dL (ref 6.1–8.1)

## 2019-10-21 LAB — LIPID PANEL
Cholesterol: 150 mg/dL (ref <200)
HDL: 54 mg/dL (ref >=40)
LDL Cholesterol (Calc): 83 mg/dL (calc)
Non-HDL Cholesterol (Calc): 96 mg/dL (calc) (ref <130)
Total CHOL/HDL Ratio: 2.8 (calc) (ref <5.0)
Triglycerides: 53 mg/dL (ref <150)

## 2019-10-21 LAB — URIC ACID: Uric Acid, Serum: 5.7 mg/dL (ref 4.0–8.0)

## 2019-10-21 LAB — HEMOGLOBIN A1C
Hgb A1c MFr Bld: 6.1 % of total Hgb — ABNORMAL HIGH (ref <5.7)
Mean Plasma Glucose: 128 (calc)
eAG (mmol/L): 7.1 (calc)

## 2019-10-21 NOTE — Progress Notes (Signed)
Samuel Moyer, Samuel Moyer ([id number]) Visit Report for 10/21/2019 Allergy List Details Patient Name: Date of Service: Samuel Moyer, Samuel Moyer 10/21/2019 9:00 A M Medical Record Number: [id number] Patient Account Number: [id number] Date of Birth/Sex: Treating RN: May 21, 1940 (79 y.o. [name] Primary Care [name]: [name] Other Clinician: Referring [name]: Treating [name] Weeks in Treatment: 0 Allergies Active Allergies No Known Allergies Allergy Notes Electronic Signature(s) Signed: 10/21/2019 4:05:29 PM By: Baruch Gouty RN, BSN Entered By: Baruch Gouty on 10/21/2019 09:15:44 -------------------------------------------------------------------------------- Arrival Information Details Patient Name: Date of Service: Samuel Moyer, Samuel Moyer 10/21/2019 9:00 A M Medical Record Number: [id number] Patient Account Number: [id number] Date of Birth/Sex: Treating RN: Sep 17, 1940 (79 y.o. [name] Primary Care [name]: [name] Other Clinician: Referring [name]: Treating Suzann Lazaro/Extender: [name] in Treatment: 0 Visit Information Patient Arrived: Ambulatory Arrival Time: 09:05 Accompanied By: self Transfer Assistance: None Patient Identification Verified: Yes Secondary Verification Process Completed: Yes Patient Requires Transmission-Based Precautions: No Patient Has Alerts: No Electronic Signature(s) Signed: 10/21/2019 4:05:29 PM By: Baruch Gouty RN, BSN Entered By: Baruch Gouty on 10/21/2019 09:12:55 -------------------------------------------------------------------------------- Clinic Level of Care Assessment Details Patient Name: Date of Service: Samuel Moyer, Samuel Moyer 10/21/2019 9:00 A M Medical Record Number: [id number] Patient Account Number: [id number] Date of Birth/Sex: Treating RN: January 19, 1941 (79 y.o. [name]) [name] Primary Care [name]: [name] Other Clinician: Referring Verdine Grenfell: Treating  [name]/Extender: [name] in Treatment: 0 Clinic Level of Care Assessment Items TOOL 2 Quantity Score X- 1 0 Use when only an EandM is performed on the INITIAL visit ASSESSMENTS - Nursing Assessment / Reassessment X- 1 20 General Physical Exam (combine w/ comprehensive assessment (listed just below) when performed on new pt. evals) X- 1 25 Comprehensive Assessment (HX, ROS, Risk Assessments, Wounds Hx, etc.) ASSESSMENTS - Wound and Skin A ssessment / Reassessment X - Simple Wound Assessment / Reassessment - one wound 1 5 []  - 0 Complex Wound Assessment / Reassessment - multiple wounds []  - 0 Dermatologic / Skin Assessment (not related to wound area) ASSESSMENTS - Ostomy and/or Continence Assessment and Care []  - 0 Incontinence Assessment and Management []  - 0 Ostomy Care Assessment and Management (repouching, etc.) PROCESS - Coordination of Care []  - 0 Simple Patient / Family Education for ongoing care []  - 0 Complex (extensive) Patient / Family Education for ongoing care X- 1 10 Staff obtains Programmer, systems, Records, T Results / Process Orders est []  - 0 Staff telephones HHA, Nursing Homes / Clarify orders / etc []  - 0 Routine Transfer to another Facility (non-emergent condition) []  - 0 Routine Hospital Admission (non-emergent condition) []  - 0 New Admissions / Biomedical engineer / Ordering NPWT Apligraf, etc. , []  - 0 Emergency Hospital Admission (emergent condition) X- 1 10 Simple Discharge Coordination []  - 0 Complex (extensive) Discharge Coordination PROCESS - Special Needs []  - 0 Pediatric / Minor Patient Management []  - 0 Isolation Patient Management []  - 0 Hearing / Language / Visual special needs []  - 0 Assessment of Community assistance (transportation, D/C planning, etc.) []  - 0 Additional assistance / Altered mentation []  - 0 Support Surface(s) Assessment (bed, cushion, seat, etc.) INTERVENTIONS - Wound Cleansing /  Measurement []  - 0 Wound Imaging (photographs - any number of wounds) []  - 0 Wound Tracing (instead of photographs) []  - 0 Simple Wound Measurement - one wound []  - 0 Complex Wound Measurement - multiple wounds []  - 0 Simple Wound Cleansing - one wound []  - 0 Complex Wound Cleansing - multiple  wounds INTERVENTIONS - Wound Dressings []  - 0 Small Wound Dressing one or multiple wounds []  - 0 Medium Wound Dressing one or multiple wounds []  - 0 Large Wound Dressing one or multiple wounds []  - 0 Application of Medications - injection INTERVENTIONS - Miscellaneous []  - 0 External ear exam []  - 0 Specimen Collection (cultures, biopsies, blood, body fluids, etc.) []  - 0 Specimen(s) / Culture(s) sent or taken to Lab for analysis []  - 0 Patient Transfer (multiple staff / Harrel Lemon Lift / Similar devices) []  - 0 Simple Staple / Suture removal (25 or less) []  - 0 Complex Staple / Suture removal (26 or more) []  - 0 Hypo / Hyperglycemic Management (close monitor of Blood Glucose) []  - 0 Ankle / Brachial Index (ABI) - do not check if billed separately Has the patient been seen at the hospital within the last three years: Yes Total Score: 70 Level Of Care: New/Established - Level 2 Electronic Signature(s) Signed: 10/21/2019 4:24:48 PM By: [name] RN Entered By: [name] on 10/21/2019 10:14:23 -------------------------------------------------------------------------------- Encounter Discharge Information Details Patient Name: Date of Service: Samuel Moyer 10/21/2019 9:00 [place] Record Number: [id number] Patient Account Number: [id number] Date of Birth/Sex: Treating RN: [birth year] (79 y.o. [name]) [name] Primary Care [name]: [name] Other Clinician: Referring [name]: Treating [name]/Extender: [name] in Treatment: 0 Encounter Discharge Information Items Discharge Condition: Stable Ambulatory Status: Ambulatory Discharge  Destination: Home Transportation: Private Auto Accompanied By: self Schedule Follow-up Appointment: Yes Clinical Summary of Care: Patient Declined Electronic Signature(s) Signed: 10/21/2019 4:24:48 PM By: [name] RN Entered By: [name] on 10/21/2019 10:21:18 -------------------------------------------------------------------------------- Lower Extremity Assessment Details Patient Name: Date of Service: Samuel Moyer, Samuel Moyer 10/21/2019 9:00 A M Medical Record Number: [id number] Patient Account Number: [id number] Date of Birth/Sex: Treating RN: 08-Sep-1940 (79 y.o. [name] Primary Care Dorris Pierre: [name] Other Clinician: Referring Ting Cage: Treating Shaundrea Carrigg/Extender: [name] in Treatment: 0 Edema Assessment Assessed: [Left: No] [Right: No] Edema: [Left: Ye] [Right: s] Calf Left: Right: Point of Measurement: cm From Medial Instep cm 37.6 cm Ankle Left: Right: Point of Measurement: cm From Medial Instep cm 25.4 cm Vascular Assessment Pulses: Dorsalis Pedis Palpable: [Right:Yes] Electronic Signature(s) Signed: 10/21/2019 4:05:29 PM By: Baruch Gouty RN, BSN Entered By: Baruch Gouty on 10/21/2019 09:31:13 -------------------------------------------------------------------------------- Multi-Disciplinary Care Plan Details Patient Name: Date of Service: Samuel Moyer, Samuel Moyer 10/21/2019 9:00 A M Medical Record Number: [id number] Patient Account Number: [id number] Date of Birth/Sex: Treating RN: 03-25-1940 (79 y.o. Oval Linsey Primary Care Audreena Sachdeva: [name] Other Clinician: Referring Catriona Dillenbeck: Treating Jhan Conery/Extender: [name] in Treatment: 0 Active Inactive Electronic Signature(s) Signed: 10/21/2019 4:24:48 PM By: [name] RN Entered By: [name] on 10/21/2019 11:05:59 -------------------------------------------------------------------------------- Pain Assessment Details Patient Name: Date of  Service: Samuel Moyer, Samuel Moyer 10/21/2019 9:00 A M Medical Record Number: [id number] Patient Account Number: [id number] Date of Birth/Sex: Treating RN: 14-Apr-1940 (79 y.o. [name] Primary Care Sherree Shankman: [name] Other Clinician: Referring Sacha Radloff: Treating Marianna Cid/Extender: [name] in Treatment: 0 Active Problems Location of Pain Severity and Description of Pain Patient Has Paino No Site Locations Rate the pain. Rate the pain. Current Pain Level: 0 Pain Management and Medication Current Pain Management: Electronic Signature(s) Signed: 10/21/2019 4:05:29 PM By: Baruch Gouty RN, BSN Entered By: Baruch Gouty on 10/21/2019 09:32:06 -------------------------------------------------------------------------------- Patient/Caregiver Education Details Patient Name: Date of Service: Samuel Moyer, Samuel Moyer 9/21/2021andnbsp9:00 [place] Record Number: [id number] Patient Account Number: [id number] Date of Birth/Gender:  Treating RN: 1940/06/01 (79 y.o. Oval Linsey Primary Care Physician: [name] Other Clinician: Referring Physician: Treating Physician/Extender: [name] in Treatment: 0 Education Assessment Education Provided To: Patient Education Topics Provided Wound/Skin Impairment: Methods: Explain/Verbal Responses: State content correctly Electronic Signature(s) Signed: 10/21/2019 4:24:48 PM By: [name] RN Entered By: [name] on 10/21/2019 10:13:35 -------------------------------------------------------------------------------- [place] Details Patient Name: Date of Service: Samuel Moyer 10/21/2019 9:00 A M Medical Record Number: [id number] Patient Account Number: [id number] Date of Birth/Sex: Treating RN: Aug 03, 1940 (79 y.o. [name] Primary Care Beckhem Isadore: [name] Other Clinician: Referring Jennika Ringgold: Treating Kendra Grissett/Extender: [name] in Treatment:  0 Vital Signs Time Taken: 09:13 Temperature (F): 98.4 Height (in): 68 Pulse (bpm): 40 Source: Stated Respiratory Rate (breaths/min): 18 Weight (lbs): 200 Blood Pressure (mmHg): 164/97 Source: Stated Reference Range: 80 - 120 mg / dl Body Mass Index (BMI): 30.4 Electronic Signature(s) Signed: 10/21/2019 4:05:29 PM By: Baruch Gouty RN, BSN Entered By: Baruch Gouty on 10/21/2019 09:13:45

## 2019-10-21 NOTE — Progress Notes (Signed)
Samuel Moyer, Samuel Moyer ([id number]) Visit Report for 10/21/2019 Abuse/Suicide Risk Screen Details Patient Name: Date of Service: Samuel Moyer, Samuel Moyer 10/21/2019 9:00 A M Medical Record Number: [id number] Patient Account Number: [id number] Date of Birth/Sex: Treating RN: [birth year] (79 y.o. [name] Primary Care Nisreen Guise: [name] Other Clinician: Referring Kore Madlock: Treating Harrington Jobe/Extender: [name] in Treatment: 0 Abuse/Suicide Risk Screen Items Answer ABUSE RISK SCREEN: Has anyone close to you tried to hurt or harm you recentlyo No Do you feel uncomfortable with anyone in your familyo No Has anyone forced you do things that you didnt want to doo No Electronic Signature(s) Signed: 10/21/2019 4:05:29 PM By: Baruch Gouty RN, BSN Entered By: Baruch Gouty on 10/21/2019 09:26:00 -------------------------------------------------------------------------------- Activities of Daily Living Details Patient Name: Date of Service: Samuel Moyer, Samuel Moyer 10/21/2019 9:00 A M Medical Record Number: [id number] Patient Account Number: [id number] Date of Birth/Sex: Treating RN: [birth year] (79 y.o. [name] Primary Care Colleena Kurtenbach: [name] Other Clinician: Referring Mikhayla Phillis: Treating Elia Keenum/Extender: [name] in Treatment: 0 Activities of Daily Living Items Answer Activities of Daily Living (Please select one for each item) Drive Automobile Completely Able T Medications ake Completely Able Use T elephone Completely Able Care for Appearance Completely Able Use T oilet Completely Able Bath / Shower Completely Able Dress Self Completely Able Feed Self Completely Able Walk Completely Able Get In / Out Bed Completely Able Housework Completely Able Prepare Meals Completely [place] for Self Completely Able Electronic Signature(s) Signed: 10/21/2019 4:05:29 PM By: Baruch Gouty RN, BSN Entered By:  Baruch Gouty on 10/21/2019 09:26:26 -------------------------------------------------------------------------------- Education Screening Details Patient Name: Date of Service: Samuel Moyer, Samuel Moyer 10/21/2019 9:00 A M Medical Record Number: [id number] Patient Account Number: [id number] Date of Birth/Sex: Treating RN: [birth year] (79 y.o. [name] Primary Care Gloria Lambertson: [name] Other Clinician: Referring Katai Marsico: Treating Lovie Agresta/Extender: [name] in Treatment: 0 Primary Learner Assessed: Patient Learning Preferences/Education Level/Primary Language Learning Preference: Explanation, Demonstration, Printed Material Highest Education Level: College or Above Preferred Language: English Cognitive Barrier Language Barrier: No Translator Needed: No Memory Deficit: No Emotional Barrier: No Cultural/Religious Beliefs Affecting Medical Care: No Physical Barrier Impaired Vision: Yes Glasses Impaired Hearing: Yes hard of hearing Decreased Hand dexterity: No Knowledge/Comprehension Knowledge Level: High Comprehension Level: High Ability to understand written instructions: High Ability to understand verbal instructions: High Motivation Anxiety Level: Calm Cooperation: Cooperative Education Importance: Acknowledges Need Interest in Health Problems: Asks Questions Perception: Coherent Willingness to Engage in Self-Management High Activities: Readiness to Engage in Self-Management High Activities: Electronic Signature(s) Signed: 10/21/2019 4:05:29 PM By: Baruch Gouty RN, BSN Entered By: Baruch Gouty on 10/21/2019 09:28:04 -------------------------------------------------------------------------------- Fall Risk Assessment Details Patient Name: Date of Service: Samuel Moyer 10/21/2019 9:00 A M Medical Record Number: [id number] Patient Account Number: [id number] Date of Birth/Sex: Treating RN: [birth year] (79 y.o. [name] Primary  Care Ferlin Fairhurst: [name] Other Clinician: Referring Yi Falletta: Treating Henning Ehle/Extender: [name] in Treatment: 0 Fall Risk Assessment Items Have you had 2 or more falls in the last 12 monthso 0 Yes Have you had any fall that resulted in injury in the last 12 monthso 0 Yes FALLS RISK SCREEN History of falling - immediate or within 3 months 25 Yes Secondary diagnosis (Do you have 2 or more medical diagnoseso) 0 No Ambulatory aid None/bed rest/wheelchair/nurse 0 Yes Crutches/cane/walker 0 No Furniture 0 No Intravenous therapy Access/Saline/Heparin Lock 0 No Gait/Transferring Normal/ bed rest/ wheelchair 0 Yes Weak (short steps with  or without shuffle, stooped but able to lift head while walking, may seek 0 No support from furniture) Impaired (short steps with shuffle, may have difficulty arising from chair, head down, impaired 0 No balance) Mental Status Oriented to own ability 0 Yes Electronic Signature(s) Signed: 10/21/2019 4:05:29 PM By: Baruch Gouty RN, BSN Entered By: Baruch Gouty on 10/21/2019 09:28:49 -------------------------------------------------------------------------------- Foot Assessment Details Patient Name: Date of Service: Samuel Moyer, Samuel Moyer 10/21/2019 9:00 A M Medical Record Number: [id number] Patient Account Number: [id number] Date of Birth/Sex: Treating RN: [birth year] (79 y.o. [name] Primary Care Kahmari Koller: [name] Other Clinician: Referring Brenn Deziel: Treating Mervil Wacker/Extender: [name] in Treatment: 0 Foot Assessment Items Site Locations + = Sensation present, - = Sensation absent, C = Callus, U = Ulcer R = Redness, W = Warmth, M = Maceration, PU = Pre-ulcerative lesion F = Fissure, S = Swelling, D = Dryness Assessment Right: Left: Other Deformity: No No Prior Foot Ulcer: No No Prior Amputation: No No Charcot Joint: No No Ambulatory Status: Ambulatory Without Help Gait:  Steady Electronic Signature(s) Signed: 10/21/2019 4:05:29 PM By: Baruch Gouty RN, BSN Entered By: Baruch Gouty on 10/21/2019 09:30:06 -------------------------------------------------------------------------------- Nutrition Risk Screening Details Patient Name: Date of Service: Samuel Moyer, Samuel Moyer 10/21/2019 9:00 A M Medical Record Number: [id number] Patient Account Number: [id number] Date of Birth/Sex: Treating RN: 1941/01/11 (79 y.o. [name] Primary Care Cathie Bonnell: [name] Other Clinician: Referring Norris Brumbach: Treating Guillermo Nehring/Extender: [name] in Treatment: 0 Height (in): 68 Weight (lbs): 200 Body Mass Index (BMI): 30.4 Nutrition Risk Screening Items Score Screening NUTRITION RISK SCREEN: I have an illness or condition that made me change the kind and/or amount of food I eat 0 No I eat fewer than two meals per day 0 No I eat few fruits and vegetables, or milk products 0 No I have three or more drinks of beer, liquor or wine almost every day 0 No I have tooth or mouth problems that make it hard for me to eat 0 No I don't always have enough money to buy the food I need 0 No I eat alone most of the time 0 No I take three or more different prescribed or over-the-counter drugs a day 1 Yes Without wanting to, I have lost or gained 10 pounds in the last six months 0 No I am not always physically able to shop, cook and/or feed myself 0 No Nutrition Protocols Good Risk Protocol 0 No interventions needed Moderate Risk Protocol High Risk Proctocol Risk Level: Good Risk Score: 1 Electronic Signature(s) Signed: 10/21/2019 4:05:29 PM By: Baruch Gouty RN, BSN Entered By: Baruch Gouty on 10/21/2019 09:29:53

## 2019-10-21 NOTE — Progress Notes (Signed)
Samuel Moyer, Samuel Moyer ([id number]) Visit Report for 10/21/2019 Chief Complaint Document Details Patient Name: Date of Service: Samuel Moyer, Samuel Moyer 10/21/2019 9:00 A M Medical Record Number: [id number] Patient Account Number: [id number] Date of Birth/Sex: Treating RN: Jun 13, 1940 (79 y.o. [name]) [name] Primary Care Provider: [name] Other Clinician: Referring Provider: Treating Provider/Extender: [name] in Treatment: 0 Information Obtained from: Patient Chief Complaint 10/21/2019; patient is here out of concern for an area on the right lateral lower leg Electronic Signature(s) Signed: 10/21/2019 4:19:57 PM By: Linton Ham MD Entered By: Linton Ham on 10/21/2019 10:43:39 -------------------------------------------------------------------------------- HPI Details Patient Name: Date of Service: Samuel Moyer 10/21/2019 9:00 A M Medical Record Number: [id number] Patient Account Number: [id number] Date of Birth/Sex: Treating RN: Jun 17, 1940 (79 y.o. Oval Linsey Primary Care Provider: [name] Other Clinician: Referring Provider: Treating Provider/Extender: [name] in Treatment: 0 History of Present Illness HPI Description: ADMISSION 10/09/2019 This is an 79 year old man who apparently sometime in July/21 developed poison ivy on his right leg felt to be complicated by secondary cellulitis. He received a course of doxycycline as well as topical therapy for the poison ivy. However he returned to his primary doctor with an area on the right lateral lower extremity in early September. He has been using topical Bactroban to this I'm not really sure how this came about. He also received a course of oral Augmentin. He came in today to still was see [place] about the wound but it is totally epithelialized.Marland Kitchen He has very significant hemosiderin deposition in both legs. I suspect that some of this is related to chronic sun exposure however a degree of  chronic venous insufficiency is probably likely as well. Finally on the medial mid calf he has a plaque that is present. I think this is going to need to be biopsied. I referred him to dermatology. Past medical history includes chronic A. fib, gout, combined heart failure, hypertension, hyperlipidemia, asthma, borderline diabetes We did not do arterial studies in the clinic today. His wound is healed his peripheral pulses are robust to palpation Electronic Signature(s) Signed: 10/21/2019 4:19:57 PM By: Linton Ham MD Entered By: Linton Ham on 10/21/2019 10:48:46 -------------------------------------------------------------------------------- Physical Exam Details Patient Name: Date of Service: Samuel Moyer, Samuel Moyer 10/21/2019 9:00 A M Medical Record Number: [id number] Patient Account Number: [id number] Date of Birth/Sex: Treating RN: [birth year] (79 y.o. Oval Linsey Primary Care Provider: [name] Other Clinician: Referring Provider: Treating Provider/Extender: [name] in Treatment: 0 Constitutional Patient is hypertensive.. Pulse regular and within target range for patient.Marland Kitchen Respirations regular, non-labored and within target range.. Temperature is normal and within the target range for the patient.Marland Kitchen Appears in no distress. Ears, Nose, Mouth, and Throat Patient is hard of hearing.. Cardiovascular Needle pulses are robust. Minimal edema nonpitting. Marked skin discoloration on both lower extremities.. Integumentary (Hair, Skin) There is a scaly raised plaque on the right medial mid lower extremity. This is well separated from normal skin and doesn't appear to have subcutaneous involvement. Notes Wound exam; the area was on his right lateral lower leg this is totally epithelialized and healed at this point. Electronic Signature(s) Signed: 10/21/2019 4:19:57 PM By: Linton Ham MD Entered By: Linton Ham on 10/21/2019  10:50:13 -------------------------------------------------------------------------------- Physician Orders Details Patient Name: Date of Service: Samuel Moyer, Samuel Moyer 10/21/2019 9:00 A M Medical Record Number: [id number] Patient Account Number: [id number] Date of Birth/Sex: Treating RN: [birth year] (79 y.o. Oval Linsey Primary Care Provider: [name] Other Clinician: Referring Provider: Treating Provider/Extender:  Derald Macleod Weeks in Treatment: 0 Verbal / Phone Orders: No Diagnosis Coding Discharge From [facility] Services Discharge from [place] - patient to make appointment with dermatology reference area to left medial lower leg . Electronic Signature(s) Signed: 10/21/2019 4:19:57 PM By: Linton Ham MD Signed: 10/21/2019 4:24:48 PM By: [name] RN Entered By: [name] on 10/21/2019 10:15:43 -------------------------------------------------------------------------------- Problem List Details Patient Name: Date of Service: Samuel Moyer, Samuel Moyer 10/21/2019 9:00 A M Medical Record Number: [id number] Patient Account Number: [id number] Date of Birth/Sex: Treating RN: October 01, 1940 (79 y.o. 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 [name]) [name] Primary Care Provider: [name] Other Clinician: Referring Provider: Treating Provider/Extender: [name] in Treatment: 0 Subjective Chief Complaint Information obtained from Patient 10/21/2019; patient is here out of concern for an area on the right lateral lower leg History of Present Illness (HPI) ADMISSION 10/09/2019 This is an 79 year old man who apparently sometime in July/21 developed poison ivy on his right leg felt to be complicated by secondary cellulitis. He received a course of doxycycline as well as topical therapy for the poison ivy. However he returned to his primary doctor with an area on the right lateral lower extremity in early September. He has been using topical Bactroban to this I'm not really sure how this came about. He also received a course of oral Augmentin. He came in today to still was see [place] about the wound but it is totally epithelialized.Marland Kitchen He has very significant hemosiderin deposition in both legs. I suspect that some of this is related to chronic sun exposure however a degree of chronic venous insufficiency is probably likely as well. Finally on the medial mid calf he has a plaque that is present. I think this is going to need to be biopsied. I referred him to dermatology. Past medical history includes chronic A. fib, gout, combined heart failure, hypertension, hyperlipidemia, asthma, borderline diabetes We did not do arterial studies in the clinic today. His wound is healed his peripheral pulses are robust to palpation Patient History Information obtained from Patient. Allergies No Known Allergies Family History Diabetes - Mother, Heart Disease - Father, Hypertension - Father,Mother, Stroke - Mother, No family history of Cancer, Hereditary Spherocytosis, Kidney Disease, Lung Disease, Seizures, Thyroid Problems, Tuberculosis. Social  History Former smoker - quit 1979, Marital Status - Married, Alcohol Use - Rarely, Drug Use - No History, Caffeine Use - Moderate. Medical History Eyes Denies history of Cataracts, Glaucoma, Optic Neuritis Respiratory Patient has history of Sleep Apnea Cardiovascular Patient has history of Arrhythmia - afib, Congestive Heart Failure, Hypertension, Peripheral Venous Disease Endocrine Denies history of Type I Diabetes, Type II Diabetes Genitourinary Denies history of End Stage Renal Disease Musculoskeletal Patient has history of Gout, Osteoarthritis Denies history of Rheumatoid Arthritis, Osteomyelitis Psychiatric Patient has history of Confinement Anxiety Denies history of Anorexia/bulimia Medical A Surgical History Notes nd Ear/Nose/Mouth/Throat hard of hearing  Cardiovascular hypercholesteremia Review of Systems (ROS) Constitutional Symptoms (General Health) Denies complaints or symptoms of Fatigue, Fever, Chills, Marked Weight Change. Eyes Complains or has symptoms of Glasses / Contacts. Denies complaints or symptoms of Dry Eyes, Vision Changes. Respiratory Denies complaints or symptoms of Chronic or frequent coughs, Shortness of Breath. Gastrointestinal Denies complaints or symptoms of Frequent diarrhea, Nausea, Vomiting. Endocrine Denies complaints or symptoms of Heat/cold intolerance. Genitourinary Denies complaints or symptoms of Frequent urination. Integumentary (Skin) Denies complaints or symptoms of Wounds. Musculoskeletal Denies complaints or symptoms of Muscle Pain, Muscle Weakness. Neurologic Denies complaints or symptoms of Numbness/parasthesias. Psychiatric Complains or has symptoms of Claustrophobia. Denies complaints or symptoms of Suicidal. Objective Constitutional Patient is hypertensive.. Pulse regular and within target range for patient.Marland Kitchen Respirations regular, non-labored and within target range.. Temperature is normal and within the target range  for the patient.Marland Kitchen Appears in no distress. Vitals Time Taken: 9:13 AM, Height: 68 in, Source: Stated, Weight: 200 lbs, Source: Stated, BMI: 30.4, Temperature: 98.4 F, Pulse: 40 bpm, Respiratory Rate: 18 breaths/min, Blood Pressure: 164/97 mmHg. Ears, Nose, Mouth, and Throat Patient is hard of hearing.. Cardiovascular Needle pulses are robust. Minimal edema nonpitting. Marked skin discoloration on both lower extremities.. General Notes: Wound exam; the area was on his right lateral lower leg this is totally epithelialized and healed at this point. Integumentary (Hair, Skin) There is a scaly raised plaque on the right medial mid lower extremity. This is well separated from normal skin and doesn't appear to have subcutaneous involvement. Assessment Active Problems ICD-10 Non-pressure chronic ulcer of right calf with other specified severity Venous insufficiency (chronic) (peripheral) Plan Discharge From [facility] Services: Discharge from [place] - patient to make appointment with dermatology reference area to left medial lower leg . 1. The patient can be discharged from the wound care center 2. I think he probably has a degree of chronic venous insufficiency in both legs along with chronic solar skin damage. 3. His wound is closed however he has a skin lesion on the medial right leg that I think should come to the attention of dermatology and we offered to give him a number to call. I suspect this will need to be biopsied Electronic Signature(s) Signed: 10/21/2019 4:19:57 PM By: Linton Ham MD Entered By: Linton Ham on 10/21/2019 10:53:43 -------------------------------------------------------------------------------- HxROS Details Patient Name: Date of Service: Samuel Moyer 10/21/2019 9:00 A M Medical Record Number: [id number] Patient Account Number: [id number] Date of Birth/Sex: Treating RN: Oct 04, 1940 (79 y.o. [name] Primary Care Provider: [name] Other  Clinician: Referring Provider: Treating Provider/Extender: [name] in Treatment: 0 Information Obtained From Patient Constitutional Symptoms (General Health) Complaints and Symptoms: Negative for: Fatigue; Fever; Chills; Marked Weight Change Eyes Complaints and Symptoms: Positive for: Glasses / Contacts Negative for: Dry Eyes; Vision Changes Medical History: Negative for: Cataracts; Glaucoma; Optic Neuritis Respiratory Complaints and Symptoms: Negative for: Chronic or frequent coughs; Shortness of Breath Medical History: Positive for: Sleep Apnea Gastrointestinal Complaints and Symptoms: Negative for: Frequent diarrhea; Nausea; Vomiting Endocrine Complaints and Symptoms: Negative for: Heat/cold intolerance Medical History: Negative for: Type I Diabetes; Type II Diabetes Genitourinary Complaints and Symptoms: Negative for: Frequent urination Medical History: Negative for: End Stage Renal Disease Integumentary (Skin) Complaints and Symptoms: Negative for: Wounds Musculoskeletal Complaints and Symptoms: Negative for: Muscle Pain; Muscle Weakness Medical History: Positive for: Gout; Osteoarthritis Negative for: Rheumatoid Arthritis; Osteomyelitis Neurologic Complaints and Symptoms: Negative for: Numbness/parasthesias Psychiatric Complaints and Symptoms: Positive for: Claustrophobia Negative for: Suicidal  Medical History: Positive for: Confinement Anxiety Negative for: Anorexia/bulimia Ear/Nose/Mouth/Throat Medical History: Past Medical History Notes: hard of hearing Hematologic/Lymphatic Cardiovascular Medical History: Positive for: Arrhythmia - afib; Congestive Heart Failure; Hypertension; Peripheral Venous Disease Past Medical History Notes: hypercholesteremia Immunological Oncologic Immunizations Pneumococcal Vaccine: Received Pneumococcal Vaccination: Yes Implantable Devices None Family and Social History Cancer: No;  Diabetes: Yes - Mother; Heart Disease: Yes - Father; Hereditary Spherocytosis: No; Hypertension: Yes - Father,Mother; Kidney Disease: No; Lung Disease: No; Seizures: No; Stroke: Yes - Mother; Thyroid Problems: No; Tuberculosis: No; Former smoker - quit 1979; Marital Status - Married; Alcohol Use: Rarely; Drug Use: No History; Caffeine Use: Moderate; Financial Concerns: No; Food, Clothing or Shelter Needs: No; Support System Lacking: No; Transportation Concerns: No Engineer, maintenance) Signed: 10/21/2019 4:05:29 PM By: Baruch Gouty RN, BSN Signed: 10/21/2019 4:19:57 PM By: Linton Ham MD Entered By: Baruch Gouty on 10/21/2019 09:25:45 -------------------------------------------------------------------------------- [place] Details Patient Name: Date of Service: Samuel Moyer, Samuel Moyer 10/21/2019 Medical Record Number: [id number] Patient Account Number: [id number] Date of Birth/Sex: Treating RN: 1940-05-31 (79 y.o. Oval Linsey Primary Care Provider: [name] Other Clinician: Referring Provider: Treating Provider/Extender: [name] in Treatment: 0 Diagnosis Coding ICD-10 Codes Code Description [phone] Non-pressure chronic ulcer of right calf with other specified severity I87.2 Venous insufficiency (chronic) (peripheral) Facility Procedures CPT4 Code: [id number] Description: [phone] - WOUND CARE VISIT-LEV 2 EST PT Modifier: Quantity: 1 Physician Procedures : CPT4 Code Description Modifier [id number] - WC PHYS LEVEL 2 - NEW PT ICD-10 Diagnosis Description N00.370 Non-pressure chronic ulcer of right calf with other specified severity I87.2 Venous insufficiency (chronic) (peripheral) Quantity: 1 Electronic Signature(s) Signed: 10/21/2019 4:19:57 PM By: Linton Ham MD Signed: 10/21/2019 4:24:48 PM By: [name] RN Entered By: [name] on 10/21/2019 10:54:24

## 2019-10-28 ENCOUNTER — Telehealth: Payer: Self-pay | Admitting: Internal Medicine

## 2019-10-28 DIAGNOSIS — L97911 Non-pressure chronic ulcer of unspecified part of right lower leg limited to breakdown of skin: Secondary | ICD-10-CM

## 2019-10-28 NOTE — Telephone Encounter (Signed)
Referral ordered

## 2019-10-28 NOTE — Telephone Encounter (Signed)
    Patient calling to request referral to [facility] Dermatology suggested by wound care provider Patient requesting call back

## 2019-10-29 NOTE — Telephone Encounter (Signed)
Called pt there was no answer LMOM MD placed referral will reviewed call once referral has been set-up.Marland KitchenJohny Chess

## 2019-11-04 DIAGNOSIS — C44722 Squamous cell carcinoma of skin of right lower limb, including hip: Secondary | ICD-10-CM | POA: Diagnosis not present

## 2019-11-04 DIAGNOSIS — D0471 Carcinoma in situ of skin of right lower limb, including hip: Secondary | ICD-10-CM | POA: Diagnosis not present

## 2019-11-29 ENCOUNTER — Ambulatory Visit: Payer: Medicare Other | Attending: Internal Medicine

## 2019-11-29 DIAGNOSIS — Z23 Encounter for immunization: Secondary | ICD-10-CM

## 2019-11-29 NOTE — Progress Notes (Signed)
   [id number]  Name:  Samuel Moyer.    MRN: [id number] DOB: 1940-02-20  11/29/2019  [name] was observed post Covid-19 immunization for 15 minutes without incident. He was provided with Vaccine Information Sheet and instruction to access the V-Safe system.   [name] was instructed to call 911 with any severe reactions post vaccine: Marland Kitchen Difficulty breathing  . Swelling of face and throat  . A fast heartbeat  . A bad rash all over body  . Dizziness and weakness

## 2019-12-02 DIAGNOSIS — C44722 Squamous cell carcinoma of skin of right lower limb, including hip: Secondary | ICD-10-CM | POA: Diagnosis not present

## 2019-12-02 DIAGNOSIS — Z85828 Personal history of other malignant neoplasm of skin: Secondary | ICD-10-CM | POA: Diagnosis not present

## 2019-12-11 ENCOUNTER — Telehealth: Payer: Self-pay | Admitting: *Deleted

## 2019-12-11 NOTE — Telephone Encounter (Signed)
A message was left, re: his follow up visit. 

## 2020-01-08 ENCOUNTER — Emergency Department (HOSPITAL_COMMUNITY)
Admission: EM | Admit: 2020-01-08 | Discharge: 2020-01-09 | Disposition: A | Discharge status: Home / Self Care | Payer: Medicare Other | Attending: Emergency Medicine | Admitting: Emergency Medicine

## 2020-01-08 ENCOUNTER — Emergency Department (HOSPITAL_COMMUNITY): Payer: Medicare Other

## 2020-01-08 ENCOUNTER — Other Ambulatory Visit: Payer: Self-pay

## 2020-01-08 DIAGNOSIS — I5022 Chronic systolic (congestive) heart failure: Secondary | ICD-10-CM | POA: Diagnosis not present

## 2020-01-08 DIAGNOSIS — S0512XA Contusion of eyeball and orbital tissues, left eye, initial encounter: Secondary | ICD-10-CM | POA: Diagnosis not present

## 2020-01-08 DIAGNOSIS — Z7901 Long term (current) use of anticoagulants: Secondary | ICD-10-CM | POA: Insufficient documentation

## 2020-01-08 DIAGNOSIS — Z87891 Personal history of nicotine dependence: Secondary | ICD-10-CM | POA: Diagnosis not present

## 2020-01-08 DIAGNOSIS — R29810 Facial weakness: Secondary | ICD-10-CM | POA: Diagnosis not present

## 2020-01-08 DIAGNOSIS — S064XAA Epidural hemorrhage with loss of consciousness status unknown, initial encounter: Secondary | ICD-10-CM

## 2020-01-08 DIAGNOSIS — I110 Hypertensive heart disease with heart failure: Secondary | ICD-10-CM | POA: Insufficient documentation

## 2020-01-08 DIAGNOSIS — I491 Atrial premature depolarization: Secondary | ICD-10-CM | POA: Diagnosis not present

## 2020-01-08 DIAGNOSIS — S0990XA Unspecified injury of head, initial encounter: Secondary | ICD-10-CM | POA: Diagnosis present

## 2020-01-08 DIAGNOSIS — I499 Cardiac arrhythmia, unspecified: Secondary | ICD-10-CM | POA: Diagnosis not present

## 2020-01-08 DIAGNOSIS — Y92009 Unspecified place in unspecified non-institutional (private) residence as the place of occurrence of the external cause: Secondary | ICD-10-CM | POA: Insufficient documentation

## 2020-01-08 DIAGNOSIS — S064X0A Epidural hemorrhage without loss of consciousness, initial encounter: Secondary | ICD-10-CM | POA: Diagnosis not present

## 2020-01-08 DIAGNOSIS — W01198A Fall on same level from slipping, tripping and stumbling with subsequent striking against other object, initial encounter: Secondary | ICD-10-CM | POA: Diagnosis not present

## 2020-01-08 DIAGNOSIS — E1169 Type 2 diabetes mellitus with other specified complication: Secondary | ICD-10-CM | POA: Insufficient documentation

## 2020-01-08 DIAGNOSIS — Z79899 Other long term (current) drug therapy: Secondary | ICD-10-CM | POA: Insufficient documentation

## 2020-01-08 DIAGNOSIS — S064X9A Epidural hemorrhage with loss of consciousness of unspecified duration, initial encounter: Secondary | ICD-10-CM

## 2020-01-08 DIAGNOSIS — J45909 Unspecified asthma, uncomplicated: Secondary | ICD-10-CM | POA: Insufficient documentation

## 2020-01-08 DIAGNOSIS — I4891 Unspecified atrial fibrillation: Secondary | ICD-10-CM | POA: Diagnosis not present

## 2020-01-08 DIAGNOSIS — R410 Disorientation, unspecified: Secondary | ICD-10-CM | POA: Diagnosis not present

## 2020-01-08 DIAGNOSIS — R9431 Abnormal electrocardiogram [ECG] [EKG]: Secondary | ICD-10-CM | POA: Diagnosis not present

## 2020-01-08 LAB — COMPREHENSIVE METABOLIC PANEL
ALT: 23 U/L (ref 0–44)
AST: 28 U/L (ref 15–41)
Albumin: 3.9 g/dL (ref 3.5–5.0)
Alkaline Phosphatase: 71 U/L (ref 38–126)
Anion gap: 13 (ref 5–15)
BUN: 20 mg/dL (ref 8–23)
CO2: 19 mmol/L — ABNORMAL LOW (ref 22–32)
Calcium: 9.2 mg/dL (ref 8.9–10.3)
Chloride: 109 mmol/L (ref 98–111)
Creatinine, Ser: 1.16 mg/dL (ref 0.61–1.24)
GFR, Estimated: >60 mL/min (ref >60)
Glucose, Bld: 111 mg/dL — ABNORMAL HIGH (ref 70–99)
Potassium: 3.6 mmol/L (ref 3.5–5.1)
Sodium: 141 mmol/L (ref 135–145)
Total Bilirubin: 1.5 mg/dL — ABNORMAL HIGH (ref 0.3–1.2)
Total Protein: 6.9 g/dL (ref 6.5–8.1)

## 2020-01-08 LAB — CBC WITH DIFFERENTIAL/PLATELET
Abs Immature Granulocytes: 0.02 10*3/uL (ref 0.00–0.07)
Basophils Absolute: 0 10*3/uL (ref 0.0–0.1)
Basophils Relative: 1 %
Eosinophils Absolute: 0.1 10*3/uL (ref 0.0–0.5)
Eosinophils Relative: 2 %
HCT: 43.2 % (ref 39.0–52.0)
Hemoglobin: 13.6 g/dL (ref 13.0–17.0)
Immature Granulocytes: 0 %
Lymphocytes Relative: 20 %
Lymphs Abs: 1.5 10*3/uL (ref 0.7–4.0)
MCH: 32.9 pg (ref 26.0–34.0)
MCHC: 31.5 g/dL (ref 30.0–36.0)
MCV: 104.3 fL — ABNORMAL HIGH (ref 80.0–100.0)
Monocytes Absolute: 0.7 10*3/uL (ref 0.1–1.0)
Monocytes Relative: 9 %
Neutro Abs: 5.1 10*3/uL (ref 1.7–7.7)
Neutrophils Relative %: 68 %
Platelets: 195 10*3/uL (ref 150–400)
RBC: 4.14 MIL/uL — ABNORMAL LOW (ref 4.22–5.81)
RDW: 16.9 % — ABNORMAL HIGH (ref 11.5–15.5)
WBC: 7.5 10*3/uL (ref 4.0–10.5)
nRBC: 0 % (ref 0.0–0.2)

## 2020-01-08 NOTE — ED Provider Notes (Signed)
[facility] EMERGENCY DEPARTMENT Provider Note   CSN: [id number] Arrival date & time: 01/08/20  2008     History Chief Complaint  Patient presents with  . Fall    Samuel Moyer. is a 79 y.o. male.  HPI 79 year old male presents with confusion.  The patient rolled out of bed a few days ago and hit his head on the nightstand.  Now has some bruising around his left eye.  No headache or facial pain.  He is on Pradaxa.  However, there was concern that he was confused today.  He was at his house and felt like he was seeing kids of family friends but cannot pick out who exactly they were.  They always seem to be under covers or a coat.  It was pointed out to them that no kids were there and he seems to have insight into this.  No vomiting or other acute illness. No headache.  Past Medical History:  Diagnosis Date  . A-fib ([place])   . Anxiety   . Atrial fibrillation ([place])   . CHF (congestive heart failure) ([place])   . Claustrophobia    Occasionally when flying   . Colitis   . Diabetes mellitus, type 2 ([place])   . Fracture of one rib, left side, initial encounter for closed fracture 12/27/2016   Occurred 12/21/16 after a fall at home.  Left anterior seventh rib  . Gout   . Hemorrhoids   . Hyperlipidemia   . Hypertension   . LV dysfunction    EF 40-45%  . OSA (obstructive sleep apnea)    CPAP machine   . PVC's (premature ventricular contractions)     Patient Active Problem List   Diagnosis Date Noted  . Aortic atherosclerosis ([place]) 10/20/2019  . Ulcer of right lower extremity, limited to breakdown of skin ([place]) 10/08/2019  . Nerve disorder 10/08/2019  . Cellulitis of leg, right 08/08/2019  . Physical deconditioning 04/30/2019  . Coughing 04/02/2019  . Dyspnea on exertion 11/01/2018  . Strain of left quadriceps 02/22/2018  . 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 Acute pain of right knee 12/25/2017  . Fall 12/27/2016  . Gout 06/03/2015  . Prediabetes  03/05/2015  . Obesity 12/28/2014  . Venous (peripheral) insufficiency 02/16/2014  . 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 ATRIAL FIBRILLATION  01/26/2010  . Chronic systolic heart failure ([place]) 01/26/2010  . Asthma 01/14/2010  . Hypercholesterolemia 10/21/2007  . Anxiety 10/21/2007  . Essential hypertension 02/26/2006  . COLONIC POLYPS, HX OF 02/26/2006    Past Surgical History:  Procedure Laterality Date  . CARDIOVASCULAR STRESS TEST  03/02/2010   EF 50%  . US ECHOCARDIOGRAPHY  11/15/2009   EF 40-45%       Family History  Problem Relation Age of Onset  . Hypertension Father   . Heart attack Father        Age 21 (MI)  . Heart failure Father   . Colonic polyp Sister        and Father  . Stroke Mother   . Colon cancer Neg Hx   . Stomach cancer Neg Hx     Social History   Tobacco Use  . Smoking status: Former Smoker    Packs/day: 1.00    Years: 16.00    Pack years: 16.00    Types: Cigarettes    Quit date: 06/30/1977    Years since quitting: 42.5  . Smokeless tobacco: Never Used  Vaping Use  .  Vaping Use: Never used  Substance Use Topics  . Alcohol use: No  . Drug use: No    Home Medications Prior to Admission medications   Medication Sig Start Date End Date Taking? Authorizing Provider  allopurinol (ZYLOPRIM) 300 MG tablet Take 1 tablet (300 mg total) by mouth daily. To lower uric acid for gout 08/08/19  Yes Burns, Claudina Lick, MD  amLODipine (NORVASC) 5 MG tablet TAKE ONE AND ONE-HALF TABLETS DAILY (NEED TO SCHEDULE APPOINTMENT) Patient taking differently: Take 7.5 mg by mouth daily. 04/15/19  Yes Martinique, Peter M, MD  atorvastatin (LIPITOR) 10 MG tablet Take 1 tablet (10 mg total) by mouth daily. Patient taking differently: Take 10 mg by mouth at bedtime. 08/08/19  Yes Burns, Claudina Lick, MD  benazepril (LOTENSIN) 40 MG tablet TAKE 1 TABLET DAILY Patient taking differently: Take 40 mg by mouth daily. 05/28/19  Yes Burns, Claudina Lick, MD  clonazePAM (KLONOPIN) 0.5 MG  tablet TAKE 1 TABLET(0.5 MG) BY MOUTH TWICE DAILY Patient taking differently: Take 0.5 mg by mouth 2 (two) times daily. 09/24/19  Yes Binnie Rail, MD  colchicine 0.6 MG tablet Take 2 tabs po once then 1 tab one hour later for gout flair 10/20/19  Yes Burns, Claudina Lick, MD  dabigatran (PRADAXA) 150 MG CAPS capsule TAKE 1 CAPSULE EVERY 12 HOURS Patient taking differently: Take 150 mg by mouth 2 (two) times daily. 04/15/19  Yes Martinique, Peter M, MD  furosemide (LASIX) 40 MG tablet Take 40 mg in the morning and 20 mg in the afternoon 04/24/19  Yes Martinique, Peter M, MD  LUMIGAN 0.01 % SOLN Place 1 drop into both eyes at bedtime.  03/08/17  Yes [provider]  Menthol (HALLS COUGH DROPS MT) Use as directed 1 drop in the mouth or throat daily as needed (cough).   Yes [provider]  metoprolol tartrate (LOPRESSOR) 25 MG tablet TAKE 2 TABLETS TWICE A DAY (NEED OFFICE VISIT) (SCHEDULE AN APPOINTMENT FOR FUTURE REFILLS) Patient taking differently: Take 50 mg by mouth 2 (two) times daily. 08/20/19  Yes Martinique, Peter M, MD  Multiple Vitamin (MULTIVITAMIN) tablet Take 1 tablet by mouth at bedtime.   Yes [provider]  mupirocin ointment (BACTROBAN) 2 % Apply 1 application topically 2 (two) times daily. 10/08/19  Yes Burns, Claudina Lick, MD  [facility] DELICA LANCETS 37T MISC 1 each by Other route daily. Check blood sugar daily as directed Patient taking differently: 1 each by Other route daily. Check blood sugar PRN as directed 03/05/15  Yes Burns, Claudina Lick, MD  PARoxetine (PAXIL) 20 MG tablet TAKE ONE-HALF (1/2) TABLET DAILY Patient taking differently: Take 10 mg by mouth daily. 06/03/19  Yes Binnie Rail, MD    Allergies    Patient has no known allergies.  Review of Systems   Review of Systems  Constitutional: Negative for fever.  Respiratory: Negative for cough.   Cardiovascular: Negative for chest pain.  Gastrointestinal: Negative for vomiting.  Neurological: Negative for weakness and  headaches.  Psychiatric/Behavioral: Positive for confusion.  All other systems reviewed and are negative.   Physical Exam Updated Vital Signs BP (!) 154/89   Pulse 81   Temp 98.1 F (36.7 C) (Oral)   Resp 16   Ht 5\' 8"  (1.727 m)   Wt 90.7 kg   SpO2 95%   BMI 30.41 kg/m   Physical Exam Vitals and nursing note reviewed.  Constitutional:      Appearance: He is well-developed and well-nourished.  HENT:     Head: Normocephalic. Contusion present.      Right Ear: External ear normal.     Left Ear: External ear normal.     Nose: Nose normal.  Eyes:     General:        Right eye: No discharge.        Left eye: No discharge.     Extraocular Movements: Extraocular movements intact.     Pupils: Pupils are equal, round, and reactive to light.  Cardiovascular:     Rate and Rhythm: Normal rate and regular rhythm.     Heart sounds: Normal heart sounds.  Pulmonary:     Effort: Pulmonary effort is normal.     Breath sounds: Normal breath sounds.  Abdominal:     Palpations: Abdomen is soft.     Tenderness: There is no abdominal tenderness.  Musculoskeletal:        General: No edema.     Cervical back: Neck supple.  Skin:    General: Skin is warm and dry.  Neurological:     Mental Status: He is alert and oriented to person, place, and time.     Comments: CN 3-12 grossly intact. 5/5 strength in all 4 extremities. Grossly normal sensation. Normal finger to nose.   Psychiatric:        Mood and Affect: Mood is not anxious.     ED Results / Procedures / Treatments   Labs (all labs ordered are listed, but only abnormal results are displayed) Labs Reviewed  COMPREHENSIVE METABOLIC PANEL - Abnormal; Notable for the following components:      Result Value   CO2 19 (*)    Glucose, Bld 111 (*)    Total Bilirubin 1.5 (*)    All other components within normal limits  CBC WITH DIFFERENTIAL/PLATELET - Abnormal; Notable for the following components:   RBC 4.14 (*)    MCV 104.3 (*)     RDW 16.9 (*)    All other components within normal limits  URINALYSIS, ROUTINE W REFLEX MICROSCOPIC    EKG EKG Interpretation  Date/Time:  Thursday January 08 2020 20:09:41 EST Ventricular Rate:  95 PR Interval:    QRS Duration: 143 QT Interval:  371 QTC Calculation: 389 R Axis:   32 Text Interpretation: Sinus rhythm Multiform ventricular premature complexes Short PR interval Consider left atrial enlargement Right bundle branch block Poor data quality in current ECG precludes serial comparison Confirmed by Sherwood Gambler [phone]) on 01/08/2020 8:33:53 PM   Radiology CT Head Wo Contrast  Addendum Date: 01/08/2020   ADDENDUM REPORT: 01/08/2020 21:58 ADDENDUM: These results were called by telephone at the time of interpretation on 01/08/2020 at 9:55 pm to provider Sherwood Gambler , who verbally acknowledged these results. Electronically Signed   By: Iven Finn M.D.   On: 01/08/2020 21:58   Result Date: 01/08/2020 CLINICAL DATA:  head on a night table rolling out of bed. Pt has bruising to Lf eye. Cerebral hemorrhage suspected. EXAM: CT HEAD WITHOUT CONTRAST TECHNIQUE: Contiguous axial images were obtained from the base of the skull through the vertex without intravenous contrast. COMPARISON:  None. FINDINGS: Brain: Cerebral ventricle sizes are concordant with the degree of cerebral volume loss. Patchy and confluent areas of decreased attenuation are noted throughout the deep and periventricular white matter of the cerebral hemispheres bilaterally, compatible with chronic microvascular ischemic disease. No evidence of large-territorial acute infarction. No parenchymal hemorrhage. No mass lesion. There is a 1.3 cm left frontal extra-axial  lesion with a density similar to the gray matter. No mass effect or midline shift. No hydrocephalus. Basilar cisterns are patent. Vascular: No hyperdense vessel. Atherosclerotic calcifications are present within the cavernous internal carotid and vertebral  arteries. Skull: No acute fracture or focal lesion. Sinuses/Orbits: Paranasal sinuses and mastoid air cells are clear. The orbits are unremarkable. Other: Mild scalp left frontal subcutaneus soft tissue edema. No significant hematoma formation. IMPRESSION: Subacute 1.3 cm epidural left frontal hematoma. No associated shift. No overlying calvarial fracture. Recommend repeat CT in 6 hours. Electronically Signed: By: Iven Finn M.D. On: 01/08/2020 21:48    Procedures Procedures (including critical care time)  Medications Ordered in ED Medications - No data to display  ED Course  I have reviewed the triage vital signs and the nursing notes.  Pertinent labs & imaging results that were available during my care of the patient were reviewed by me and considered in my medical decision making (see chart for details).    MDM Rules/Calculators/A&P                          Patient CT has been personally reviewed and seems to show small epidural hematoma under the area of where he had probably have the ecchymosis.  Discussed with Dr. Reatha Armour, who has reviewed CT and thinks this is likely pretty benign and probably fairly old.  Recommends and agrees with the 6-hour CT.  If stable, can stay on his Pradaxa.  Follow-up with neurosurgery as an outpatient if negative.  Care transferred to Dr. Ronnald Nian with this pending.  Of note, the patient is not confused/altered at this time and is unclear what caused that transient confusion at home.  I doubt this is related to this epidural. Final Clinical Impression(s) / ED Diagnoses Final diagnoses:  None    Rx / DC Orders ED Discharge Orders    None       Sherwood Gambler, MD 01/08/20 2329

## 2020-01-08 NOTE — ED Notes (Signed)
Pt's family member has been updated on pt's status and plan of care.

## 2020-01-08 NOTE — ED Notes (Signed)
Patient transported to CT 

## 2020-01-08 NOTE — ED Triage Notes (Signed)
Pt arrives to ED BIB [facility] due to a fall. Per EMS pt had a fall on Thursday of last week (01/01/2020). Per EMS pt hit his head on a night table rolling out of bed. Pt has bruising to Lf eye. Per EMS pt's family members state that pt has had episodes of confusion prior to fall but has progressively worsen since fall. Hx of afib and is on Pradaxa. Pt is a/o x4.  T 98.7 BP 160/90 HR 80 O2 98% RA CBG 123

## 2020-01-08 NOTE — ED Provider Notes (Signed)
Signed out to me at 11 PM.  Patient with small epidural, likely old.  Neurosurgery aware.  Plan is for repeat head CT yet around 330/4 AM and if stable will discharge with close neurosurgery follow-up.  Patient with unremarkable repeat head CT.  Stable findings.  Safe for discharge.  To follow-up with neurosurgery.  This chart was dictated using voice recognition software.  Despite best efforts to proofread,  errors can occur which can change the documentation meaning.     Lennice Sites, DO 01/09/20 [phone]

## 2020-01-09 ENCOUNTER — Other Ambulatory Visit: Payer: Self-pay | Admitting: Internal Medicine

## 2020-01-09 ENCOUNTER — Emergency Department (HOSPITAL_COMMUNITY): Payer: Medicare Other

## 2020-01-09 DIAGNOSIS — S064X0A Epidural hemorrhage without loss of consciousness, initial encounter: Secondary | ICD-10-CM | POA: Diagnosis not present

## 2020-01-09 DIAGNOSIS — S0512XA Contusion of eyeball and orbital tissues, left eye, initial encounter: Secondary | ICD-10-CM | POA: Diagnosis not present

## 2020-01-09 LAB — URINALYSIS, ROUTINE W REFLEX MICROSCOPIC
Bacteria, UA: NONE SEEN
Bilirubin Urine: NEGATIVE
Glucose, UA: NEGATIVE mg/dL
Ketones, ur: NEGATIVE mg/dL
Leukocytes,Ua: NEGATIVE
Nitrite: NEGATIVE
Protein, ur: 30 mg/dL — AB
Specific Gravity, Urine: 1.016 (ref 1.005–1.030)
pH: 5 (ref 5.0–8.0)

## 2020-01-09 MED ORDER — PAROXETINE HCL 20 MG PO TABS: 10.0000 mg | ORAL_TABLET | Freq: Every day | ORAL | 1 refills | Status: DC

## 2020-01-09 NOTE — Discharge Instructions (Signed)
Follow up with neurosurgery. Do not recommend driving until cleared by neurosurgery.

## 2020-01-10 ENCOUNTER — Encounter: Payer: Self-pay | Admitting: Internal Medicine

## 2020-01-11 DIAGNOSIS — R441 Visual hallucinations: Secondary | ICD-10-CM | POA: Insufficient documentation

## 2020-01-11 DIAGNOSIS — S064X9A Epidural hemorrhage with loss of consciousness of unspecified duration, initial encounter: Secondary | ICD-10-CM | POA: Insufficient documentation

## 2020-01-11 DIAGNOSIS — S064XAA Epidural hemorrhage with loss of consciousness status unknown, initial encounter: Secondary | ICD-10-CM | POA: Insufficient documentation

## 2020-01-11 NOTE — Progress Notes (Signed)
Subjective:    Patient ID: Samuel Saber., male    DOB: 1940/02/16, 79 y.o.   MRN: [id number]  HPI The patient is here for follow up from the hospital   ED - 12/9 for confusion.  He had rolled out of bed a few days prior and hit his head on the nightstand.  He did not see anyone for this.  He denies headache or facial pain, but did have bruising aroudn his left eye.  He was at his house and was seeing kids of family friends but could not tell exactly who they were - they were under covers or a coat.  It was pointed out to him there were no kids there and he did have insight to this.    In ED, neuro exam normal.  Blood work, EKG unremarkable.  CT of head shows small epidural hematoma.  Neurosurgery reviewed CT and thought it was benign and probably fairly old.   Repeat Ct after 6 hrs was stable.  He was discharged and advised to follow up with neurosurgery as outpatient.   He does have a history of visual hallucinations.  Initially this was thought to be related to Paxil, but he has not currently been on it.   He has anxiety and claustrophobia.  He has not been taking the paxil.  He has had some episodes of thinking his wife was his mother.  Past couple of nights he has not slept well.  He is having issues with his CPAP and is due to have another sleep study, but had difficulty doing a sleep study because of his claustrophobia and anxiety.  He needs to reschedule this.  Medications and allergies reviewed with patient and updated if appropriate.  Patient Active Problem List   Diagnosis Date Noted  . Epidural hematoma ([place]) 01/11/2020  . Visual hallucinations 01/11/2020  . Aortic atherosclerosis ([place]) 10/20/2019  . Ulcer of right lower extremity, limited to breakdown of skin ([place]) 10/08/2019  . Nerve disorder 10/08/2019  . Cellulitis of leg, right 08/08/2019  . Physical deconditioning 04/30/2019  . Coughing 04/02/2019  . Dyspnea on exertion 11/01/2018  . Strain of left quadriceps  02/22/2018  . 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 Acute pain of right knee 12/25/2017  . Fall 12/27/2016  . Gout 06/03/2015  . Prediabetes 03/05/2015  . Obesity 12/28/2014  . Venous (peripheral) insufficiency 02/16/2014  . 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 ATRIAL FIBRILLATION  01/26/2010  . Chronic systolic heart failure ([place]) 01/26/2010  . Asthma 01/14/2010  . Hypercholesterolemia 10/21/2007  . Anxiety 10/21/2007  . Essential hypertension 02/26/2006  . COLONIC POLYPS, HX OF 02/26/2006    Current Outpatient Medications on File Prior to Visit  Medication Sig Dispense Refill  . allopurinol (ZYLOPRIM) 300 MG tablet Take 1 tablet (300 mg total) by mouth daily. To lower uric acid for gout 90 tablet 1  . amLODipine (NORVASC) 5 MG tablet TAKE ONE AND ONE-HALF TABLETS DAILY (NEED TO SCHEDULE APPOINTMENT) (Patient taking differently: Take 7.5 mg by mouth daily.) 135 tablet 3  . atorvastatin (LIPITOR) 10 MG tablet Take 1 tablet (10 mg total) by mouth daily. (Patient taking differently: Take 10 mg by mouth at bedtime.) 90 tablet 1  . benazepril (LOTENSIN) 40 MG tablet TAKE 1 TABLET DAILY (Patient taking differently: Take 40 mg by mouth daily.) 90 tablet 1  . colchicine 0.6 MG tablet Take 2 tabs po once then 1 tab one hour later for gout flair 30  tablet 1  . dabigatran (PRADAXA) 150 MG CAPS capsule TAKE 1 CAPSULE EVERY 12 HOURS (Patient taking differently: Take 150 mg by mouth 2 (two) times daily.) 180 capsule 3  . furosemide (LASIX) 40 MG tablet Take 40 mg in the morning and 20 mg in the afternoon 145 tablet 3  . LUMIGAN 0.01 % SOLN Place 1 drop into both eyes at bedtime.     . metoprolol tartrate (LOPRESSOR) 25 MG tablet TAKE 2 TABLETS TWICE A DAY (NEED OFFICE VISIT) (SCHEDULE AN APPOINTMENT FOR FUTURE REFILLS) (Patient taking differently: Take 50 mg by mouth 2 (two) times daily.) 360 tablet 3  . Multiple Vitamin (MULTIVITAMIN) tablet Take 1 tablet by mouth at bedtime.    Marland Kitchen  PARoxetine (PAXIL) 20 MG tablet Take 0.5 tablets (10 mg total) by mouth daily. 45 tablet 1   No current facility-administered medications on file prior to visit.    Past Medical History:  Diagnosis Date  . A-fib ([place])   . Anxiety   . Atrial fibrillation ([place])   . CHF (congestive heart failure) ([place])   . Claustrophobia    Occasionally when flying   . Colitis   . Diabetes mellitus, type 2 ([place])   . Fracture of one rib, left side, initial encounter for closed fracture 12/27/2016   Occurred 12/21/16 after a fall at home.  Left anterior seventh rib  . Gout   . Hemorrhoids   . Hyperlipidemia   . Hypertension   . LV dysfunction    EF 40-45%  . OSA (obstructive sleep apnea)    CPAP machine   . PVC's (premature ventricular contractions)     Past Surgical History:  Procedure Laterality Date  . CARDIOVASCULAR STRESS TEST  03/02/2010   EF 50%  . US ECHOCARDIOGRAPHY  11/15/2009   EF 40-45%    Social History   Socioeconomic History  . Marital status: Married    Spouse name: Not on file  . Number of children: 3  . Years of education: Not on file  . Highest education level: Not on file  Occupational History  . Occupation: Retired    Fish farm manager: RETIRED    Comment: [employer]   Tobacco Use  . Smoking status: Former Smoker    Packs/day: 1.00    Years: 16.00    Pack years: 16.00    Types: Cigarettes    Quit date: 06/30/1977    Years since quitting: 42.5  . Smokeless tobacco: Never Used  Vaping Use  . Vaping Use: Never used  Substance and Sexual Activity  . Alcohol use: No  . Drug use: No  . Sexual activity: Not on file  Other Topics Concern  . Not on file  Social History Narrative   2 caffeine drinks daily    Social Determinants of Health   Financial Resource Strain: Low Risk   . Difficulty of Paying Living Expenses: Not hard at all  Food Insecurity: No Food Insecurity  . Worried About Charity fundraiser in the Last Year: Never true  . Ran Out of Food in the Last  Year: Never true  Transportation Needs: No Transportation Needs  . Lack of Transportation (Medical): No  . Lack of Transportation (Non-Medical): No  Physical Activity: Inactive  . Days of Exercise per Week: 0 days  . Minutes of Exercise per Session: 0 min  Stress: No Stress Concern Present  . Feeling of Stress : Not at all  Social Connections: Not on file    Family History  Problem Relation Age of Onset  . Hypertension Father   . Heart attack Father        Age 41 (MI)  . Heart failure Father   . Colonic polyp Sister        and Father  . Stroke Mother   . Colon cancer Neg Hx   . Stomach cancer Neg Hx     Review of Systems  Constitutional: Negative for fever.  Eyes: Negative for visual disturbance.  Respiratory: Positive for shortness of breath.   Cardiovascular: Positive for chest pain (related to anxiety) and palpitations (related to anxiety).  Neurological: Negative for dizziness, light-headedness and headaches.  Psychiatric/Behavioral: Positive for confusion and sleep disturbance. The patient is nervous/anxious.        Objective:   Vitals:   01/12/20 1053  BP: 140/78  Pulse: 82  Temp: 98 F (36.7 C)  SpO2: 93%   BP Readings from Last 3 Encounters:  01/12/20 140/78  01/09/20 (!) 167/102  10/20/19 128/72   Wt Readings from Last 3 Encounters:  01/12/20 219 lb (99.3 kg)  01/08/20 200 lb (90.7 kg)  10/20/19 209 lb 6.4 oz (95 kg)   Body mass index is 33.3 kg/m.   Physical Exam    Constitutional: Appears well-developed and well-nourished. No distress.  HENT:  Head: Normocephalic and atraumatic.  Cardiovascular: Normal rate, regular rhythm and normal heart sounds.   No murmur heard. No carotid bruit .  No edema Pulmonary/Chest: Effort normal and breath sounds normal. No respiratory distress. No has no wheezes. No rales.  Skin: Skin is warm and dry. Not diaphoretic.  Psychiatric: Normal mood and affect. Behavior is normal.      Assessment & Plan:     See Problem List for Assessment and Plan of chronic medical problems.    This visit occurred during the SARS-CoV-2 public health emergency.  Safety protocols were in place, including screening questions prior to the visit, additional usage of staff PPE, and extensive cleaning of exam room while observing appropriate contact time as indicated for disinfecting solutions.

## 2020-01-12 ENCOUNTER — Other Ambulatory Visit: Payer: Self-pay

## 2020-01-12 ENCOUNTER — Encounter: Payer: Self-pay | Admitting: Internal Medicine

## 2020-01-12 ENCOUNTER — Ambulatory Visit (INDEPENDENT_AMBULATORY_CARE_PROVIDER_SITE_OTHER): Payer: Medicare Other | Admitting: Internal Medicine

## 2020-01-12 VITALS — BP 140/78 | HR 82 | Temp 98.0°F | Ht 68.0 in | Wt 219.0 lb

## 2020-01-12 DIAGNOSIS — R413 Other amnesia: Secondary | ICD-10-CM | POA: Diagnosis not present

## 2020-01-12 DIAGNOSIS — S064X9A Epidural hemorrhage with loss of consciousness of unspecified duration, initial encounter: Secondary | ICD-10-CM | POA: Diagnosis not present

## 2020-01-12 DIAGNOSIS — M1A0790 Idiopathic chronic gout, unspecified ankle and foot, without tophus (tophi): Secondary | ICD-10-CM

## 2020-01-12 DIAGNOSIS — R441 Visual hallucinations: Secondary | ICD-10-CM

## 2020-01-12 DIAGNOSIS — F419 Anxiety disorder, unspecified: Secondary | ICD-10-CM | POA: Diagnosis not present

## 2020-01-12 DIAGNOSIS — S064XAA Epidural hemorrhage with loss of consciousness status unknown, initial encounter: Secondary | ICD-10-CM

## 2020-01-12 MED ORDER — CLONAZEPAM 0.5 MG PO TABS: 0.2500 mg | ORAL_TABLET | Freq: Two times a day (BID) | ORAL | 2 refills | Status: DC

## 2020-01-12 NOTE — Assessment & Plan Note (Signed)
Chronic Not controlled He stopped the Paxil and this was helpful for him-we will restart 10 mg daily Continue clonazepam twice daily-this was added to because he was still having significant anxiety-we will decrease the dose to 0.25 mg twice daily-I do not want this to cause any drowsiness or affect his memory.  May need to discontinue this May need a higher dose of the Paxil in the near future

## 2020-01-12 NOTE — Patient Instructions (Addendum)
  Medications changes include :  Restart paxil 10 mg daily.   Decrease clonazepam to 1/2 pill twice daily.    Your prescription(s) have been submitted to your pharmacy. Please take as directed and contact our office if you believe you are having problem(s) with the medication(s).   A referral was ordered for neurology.     Someone from their office will call you to schedule an appointment.    Follow up with Dr Martinique and Dr Elsworth Soho.     Schedule an appointment with Neurosurgery.

## 2020-01-12 NOTE — Assessment & Plan Note (Signed)
Acute Seen in the emergency 12/9-had fallen a few days before and went to the ED for confusion although I do not think his confusion is related to the epidural hematoma Hematoma stable on repeat CT after 6 hours We will follow-up with neurosurgery as an outpatient Stressed that if he does hit his head he needs to go to the emergency room to be evaluated and have a CT scan because he is on a blood thinner

## 2020-01-12 NOTE — Assessment & Plan Note (Signed)
Chronic History of elevated uric acid level No recent gout symptoms Has not been taking allopurinol-we will restart for prophylaxis-300 mg daily Only as needed for gout flare

## 2020-01-12 NOTE — Assessment & Plan Note (Signed)
Chronic He has had visual hallucinations for a while.  He sees kids in the house that are not there.  The other night he saw a man outside his house that he thought was going to break in. When he first had these he thought they were related to the Paxil because when he stopped the medication he did not have them.  Most likely this was coincidence may just did not occur because he has been off the Paxil for a while and he has had the hallucinations ?  Related to seizure versus memory issues. We will refer to neurology for further evaluation He is also having a lot of issues with his CPAP - ? contributing

## 2020-01-13 ENCOUNTER — Telehealth: Payer: Self-pay | Admitting: Pulmonary Disease

## 2020-01-13 NOTE — Telephone Encounter (Signed)
Called and spoke with patient who states that he would like to now have his BiPap tritation -   [facility] please advise [phone]   Last OV note from 04/08/19  Rigoberto Noel, MD to [facility] Triage Pool     5:23 PM Note He seems to have developed new congestive heart failure which is causing increased central events on BiPAP.  Central events are not controlled in spite of high pressures on BiPAP hence the need for ASV.  Please let him know that DME is not willing to provide him with new ASV machine without a repeat sleep study. If he is willing then okay to proceed with BiPAP titration study, add comments-change to ASV if central events are not controlled with BiPAP     April 08, 2019

## 2020-01-13 NOTE — Telephone Encounter (Signed)
Spoke to pt's wife and gave her appt info.  Nothing further needed. 

## 2020-01-13 NOTE — Telephone Encounter (Signed)
I have rescheduled bipap titration to 12/29.  Left vm for pt to call me back for appt info.

## 2020-01-18 MED ORDER — TRAZODONE HCL 50 MG PO TABS: 25.0000 mg | ORAL_TABLET | Freq: Every evening | ORAL | 3 refills | Status: DC | PRN

## 2020-01-26 ENCOUNTER — Other Ambulatory Visit: Payer: Self-pay | Admitting: Neurological Surgery

## 2020-01-26 DIAGNOSIS — Z6832 Body mass index (BMI) 32.0-32.9, adult: Secondary | ICD-10-CM | POA: Diagnosis not present

## 2020-01-26 DIAGNOSIS — I10 Essential (primary) hypertension: Secondary | ICD-10-CM | POA: Diagnosis not present

## 2020-01-26 DIAGNOSIS — D329 Benign neoplasm of meninges, unspecified: Secondary | ICD-10-CM | POA: Diagnosis not present

## 2020-01-28 ENCOUNTER — Other Ambulatory Visit: Payer: Self-pay

## 2020-01-28 ENCOUNTER — Ambulatory Visit (HOSPITAL_BASED_OUTPATIENT_CLINIC_OR_DEPARTMENT_OTHER): Payer: Medicare Other | Attending: Pulmonary Disease | Admitting: Pulmonary Disease

## 2020-01-28 DIAGNOSIS — G4733 Obstructive sleep apnea (adult) (pediatric): Secondary | ICD-10-CM | POA: Diagnosis not present

## 2020-01-28 DIAGNOSIS — G4731 Primary central sleep apnea: Secondary | ICD-10-CM

## 2020-01-29 ENCOUNTER — Other Ambulatory Visit (HOSPITAL_BASED_OUTPATIENT_CLINIC_OR_DEPARTMENT_OTHER): Payer: Self-pay

## 2020-01-29 DIAGNOSIS — G4731 Primary central sleep apnea: Secondary | ICD-10-CM

## 2020-02-04 ENCOUNTER — Telehealth: Payer: Self-pay | Admitting: Pulmonary Disease

## 2020-02-04 DIAGNOSIS — G4731 Primary central sleep apnea: Secondary | ICD-10-CM | POA: Diagnosis not present

## 2020-02-04 NOTE — Procedures (Signed)
Patient Name: Samuel Moyer, Samuel Moyer Date: 01/28/2020 Gender: Male D.O.B: [birth year] Age (years): 25 Referring Provider: Cyril Mourning MD, ABSM Height (inches): 68 Interpreting Physician: Cyril Mourning MD, ABSM Weight (lbs): 200 RPSGT: Ulyess Mort BMI: 30 MRN: [id number] Neck Size: 18.00 <br> <br> CLINICAL INFORMATION The patient is referred for a BiPAP titration to treat treatment emergent central sleep apnea.    Date of NPSG:  04/06/10 (wt 200) showed severe obstructive sleep apnea with AHI 66/h, nadir desatn 75% corrrected by CPAP 10 cm to AHI 4.5/h. Higher pressures were associated with emergence of central events.     09/25/2018 - CPAP titration >>  26/22 cm auto bipap _Auto bipap EPAP min 12 , IPAP max 26, PS +4 cm  SLEEP STUDY TECHNIQUE As per the AASM Manual for the Scoring of Sleep and Associated Events v2.3 (April 2016) with a hypopnea requiring 4% desaturations.  The channels recorded and monitored were frontal, central and occipital EEG, electrooculogram (EOG), submentalis EMG (chin), nasal and oral airflow, thoracic and abdominal wall motion, anterior tibialis EMG, snore microphone, electrocardiogram, and pulse oximetry. Bilevel positive airway pressure (BPAP) was initiated at the beginning of the study and titrated to treat sleep-disordered breathing.  MEDICATIONS Medications self-administered by patient taken the night of the study : [name]  RESPIRATORY PARAMETERS Optimal IPAP Pressure (cm): 24 AHI at Optimal Pressure (/hr) 2.5 Optimal EPAP Pressure (cm): 20   Overall Minimal O2 (%): 77.0 Minimal O2 at Optimal Pressure (%): 88.0 SLEEP ARCHITECTURE Start Time: 10:27:55 PM Stop Time: 4:36:39 AM Total Time (min): 368.7 Total Sleep Time (min): 356 Sleep Latency (min): 4.7 Sleep Efficiency (%): 96.5% REM Latency (min): [name] WASO (min): 8.1 Stage N1 (%): 2.0% Stage N2 (%): 98.0% Stage N3 (%): 0.0% Stage R (%): 0 Supine (%): 100.00 Arousal Index (/hr): 5.9     CARDIAC DATA The  2 lead EKG demonstrated atrial fibrillation. The mean heart rate was 52.9 beats per minute. Other EKG findings include: PVCs.   LEG MOVEMENT DATA The total Periodic Limb Movements of Sleep (PLMS) were 0. The PLMS index was 0.0. A PLMS index of <15 is considered normal in adults.  IMPRESSIONS - An optimal PAP pressure was selected for this patient ( 24 / 20 cm of water) - Central sleep apnea was not noted during this titration (CAI = 2.4/h). - Severe oxygen desaturations were observed during this titration (min O2 = 77.0%). - The patient snored with moderate snoring volume. - 2-lead EKG demonstrated: PVCs - Clinically significant periodic limb movements were not noted during this study. Arousals associated with PLMs were rare.   DIAGNOSIS - Obstructive Sleep Apnea (G47.33)   RECOMMENDATIONS - Trial of BiPAP therapy on 24/20 cm H2O with a Large size Resmed Full Face Mask Quattro FX mask and heated humidification. - Avoid alcohol, sedatives and other CNS depressants that may worsen sleep apnea and disrupt normal sleep architecture. - Sleep hygiene should be reviewed to assess factors that may improve sleep quality. - Weight management and regular exercise should be initiated or continued. - Return to Sleep Center for re-evaluation after 4 weeks of therapy   Cyril Mourning MD Board Certified in Sleep medicine

## 2020-02-04 NOTE — Telephone Encounter (Signed)
Patient returned phone call, writer went over titration result/note per Dr Vassie Loll. All questions answered and patient expressed full understanding. Scheduled office visit with Dr Vassie Loll on Thursday 03/04/2020 at 3:30 pm at the [facility] office. Patient agreeable to time, date and location. Nothing further needed at this time.

## 2020-02-04 NOTE — Telephone Encounter (Signed)
Titration showed BiPAP 24/20 was adequate. Schedule routine 1 yr  FU OV with me - next availabe

## 2020-02-04 NOTE — Telephone Encounter (Signed)
Called and left message on voicemail to please return phone call to go over results and schedule office visit. Contact number provided.

## 2020-02-05 NOTE — Progress Notes (Signed)
Samuel Moyer. Date of Birth: 1940-10-23   History of Present Illness: Samuel Moyer is seen today for followup of CHF and atrial fibrillation. He has a history of permanent atrial fibrillation. He also is a history of congestive heart failure with ejection fraction of 40-45%. Normal myoview in 2011 with EF of 50% at that time. He does have OSA on CPAP and is followed by pulmonary.   In late February 2021 he noted increased SOB. Was seen by Dr Samuel Moyer and BNP was elevated at 772 which was above his baseline of 600. His lasix was increased for a few days. He states he really didn't notice much change in his breathing. Echo showed good EF 50-55%, severe biatrial enlargement in setting of chronic AFib, and moderate pulmonary HTN. The RV was noted to be moderately enlarged with normal systolic function.  He initially did well and was able to work at the [employer] this summer walking up stairs and only getting SOB when he got to the top. More recently he has noted increased dyspnea with activity such as going up hill or carrying something. Weight is up 10 lbs on his scales. He eats out a lot and eats a lot of processed meats. He did hit his head on his bedside table in December. CT showed ? Hematoma versus meningioma. Is scheduled for MRI by Neuro to characterize better. He had a recent sleep study by Dr Samuel Moyer and is on Bipap.     Current Outpatient Medications on File Prior to Visit  Medication Sig Dispense Refill  . allopurinol (ZYLOPRIM) 300 MG tablet Take 1 tablet (300 mg total) by mouth daily. To lower uric acid for gout 90 tablet 1  . atorvastatin (LIPITOR) 10 MG tablet Take 1 tablet (10 mg total) by mouth daily. (Patient taking differently: Take 10 mg by mouth at bedtime.) 90 tablet 1  . benazepril (LOTENSIN) 40 MG tablet TAKE 1 TABLET DAILY (Patient taking differently: Take 40 mg by mouth daily.) 90 tablet 1  . clonazePAM (KLONOPIN) 0.5 MG tablet Take 0.5 tablets (0.25 mg total) by mouth 2 (two)  times daily. 30 tablet 2  . colchicine 0.6 MG tablet Take 2 tabs po once then 1 tab one hour later for gout flair 30 tablet 1  . dabigatran (PRADAXA) 150 MG CAPS capsule TAKE 1 CAPSULE EVERY 12 HOURS (Patient taking differently: Take 150 mg by mouth 2 (two) times daily.) 180 capsule 3  . LUMIGAN 0.01 % SOLN Place 1 drop into both eyes at bedtime.     . Multiple Vitamin (MULTIVITAMIN) tablet Take 1 tablet by mouth at bedtime.    Marland Kitchen PARoxetine (PAXIL) 20 MG tablet Take 0.5 tablets (10 mg total) by mouth daily. 45 tablet 1  . traZODone (DESYREL) 50 MG tablet Take 0.5-1 tablets (25-50 mg total) by mouth at bedtime as needed for sleep. 30 tablet 3   No current facility-administered medications on file prior to visit.    No Known Allergies  Past Medical History:  Diagnosis Date  . A-fib (HCC)   . Anxiety   . Atrial fibrillation (HCC)   . CHF (congestive heart failure) (HCC)   . Claustrophobia    Occasionally when flying   . Colitis   . Diabetes mellitus, type 2 (HCC)   . Fracture of one rib, left side, initial encounter for closed fracture 12/27/2016   Occurred 12/21/16 after a fall at home.  Left anterior seventh rib  . Gout   . Hemorrhoids   .  Hyperlipidemia   . Hypertension   . LV dysfunction    EF 40-45%  . OSA (obstructive sleep apnea)    CPAP machine   . PVC's (premature ventricular contractions)     Past Surgical History:  Procedure Laterality Date  . CARDIOVASCULAR STRESS TEST  03/02/2010   EF 50%  . US ECHOCARDIOGRAPHY  11/15/2009   EF 40-45%    Social History   Tobacco Use  Smoking Status Former Smoker  . Packs/day: 1.00  . Years: 16.00  . Pack years: 16.00  . Types: Cigarettes  . Quit date: 06/30/1977  . Years since quitting: 42.6  Smokeless Tobacco Never Used    Social History   Substance and Sexual Activity  Alcohol Use No    Family History  Problem Relation Age of Onset  . Hypertension Father   . Heart attack Father        Age 63 (MI)  . Heart  failure Father   . Colonic polyp Sister        and Father  . Stroke Mother   . Colon cancer Neg Hx   . Stomach cancer Neg Hx     Review of Systems: As noted in history of present illness  All other systems were reviewed and are negative.  Physical Exam: BP (!) 152/70   Pulse (!) 48   Ht 5\' 8"  (1.727 m)   Wt 217 lb 12.8 oz (98.8 kg)   SpO2 91%   BMI 33.12 kg/m  GENERAL:  Well appearing obese WM in NAD HEENT:  PERRL, EOMI, sclera are clear. Oropharynx is clear. NECK:  No jugular venous distention, carotid upstroke brisk and symmetric, no bruits, no thyromegaly or adenopathy LUNGS: few crackles CHEST:  Unremarkable HEART:  IRRR- slow rate 48 on repeat,  PMI not displaced or sustained,S1 and S2 within normal limits, no S3, no S4: no clicks, no rubs, no murmurs ABD:  Soft, nontender. BS +, no masses or bruits. No hepatomegaly, no splenomegaly EXT:  2 + pulses throughout, 2+ lower extremity edema, no cyanosis no clubbing SKIN:  Warm and dry.  No rashes NEURO:  Alert and oriented x 3. Cranial nerves II through XII intact. PSYCH:  Cognitively intact    LABORATORY DATA: Lab Results  Component Value Date   WBC 7.5 01/08/2020   HGB 13.6 01/08/2020   HCT 43.2 01/08/2020   PLT 195 01/08/2020   GLUCOSE 111 (H) 01/08/2020   CHOL 150 10/20/2019   TRIG 53 10/20/2019   HDL 54 10/20/2019   LDLCALC 83 10/20/2019   ALT 23 01/08/2020   AST 28 01/08/2020   NA 141 01/08/2020   K 3.6 01/08/2020   CL 109 01/08/2020   CREATININE 1.16 01/08/2020   BUN 20 01/08/2020   CO2 19 (L) 01/08/2020   TSH 1.30 06/01/2016   PSA 0.63 09/12/2007   HGBA1C 6.1 (H) 10/20/2019   MICROALBUR 1.8 06/13/2013    Echo 01/09/19: IMPRESSIONS    1. Technically difficult study. Left ventricular ejection fraction, by  visual estimation, is 50 to 55%. The left ventricle has low normal  function. There is no left ventricular hypertrophy.  2. Left ventricular diastolic parameters are indeterminate.  3.  Global right ventricle has normal systolic function.The right  ventricular size is moderately enlarged.  4. There is mild dilatation of the ascending aorta measuring 36 mm.  5. Right atrial size was severely dilated.  6. Left atrial size was severely dilated.  7. The aortic valve was not well  visualized. Aortic valve regurgitation  is mild. No evidence of aortic valve stenosis.  8. The mitral valve is normal in structure. Mild mitral valve  regurgitation.  9. The tricuspid valve is normal in structure. Tricuspid valve  regurgitation is trivial.  10. The pulmonic valve was not well visualized. Pulmonic valve  regurgitation is not visualized.  11. The inferior vena cava is normal in size with greater than 50%  respiratory variability, suggesting right atrial pressure of 3 mmHg.  12. The tricuspid regurgitant velocity is 3.28 m/s, and with an assumed  right atrial pressure of 3 mmHg, the estimated right ventricular systolic  pressure is moderately elevated at 46.0 mmHg.    Assessment / Plan: 1. Permanent atrial fibrillation. Rate is slow in the 40s.  I have recommended reducing metoprolol to 25 mg bid. He is on chronic anticoagulation with Pradaxa. Renal function and Hgb have been normal.  2. Acute on chronic Congestive heart failure with combined systolic and diastolic dysfunction. Last  Echo in  December 2020 showed EF 50-55% there was some evidence of pulmonary HTN. On  ACE inhibitor, beta blocker, and diuretic therapy. He is volume overloaded. Stressed importance of sodium restriction in his diet. Will increase lasix to 40 mg bid. Follow up in 3-4 weeks with BMET and BNP. Will repeat Echo. May want to consider switching lisinopril to [facility].   3. Hypertension, BP is fair. Suspect volume overload is playing a role.    4. OSA. On Bipap.  Follow up with pulmonary.

## 2020-02-06 ENCOUNTER — Other Ambulatory Visit: Payer: Self-pay

## 2020-02-06 ENCOUNTER — Ambulatory Visit (INDEPENDENT_AMBULATORY_CARE_PROVIDER_SITE_OTHER): Payer: Medicare Other | Admitting: Cardiology

## 2020-02-06 ENCOUNTER — Encounter: Payer: Self-pay | Admitting: Cardiology

## 2020-02-06 VITALS — BP 152/70 | HR 48 | Ht 68.0 in | Wt 217.8 lb

## 2020-02-06 DIAGNOSIS — I5043 Acute on chronic combined systolic (congestive) and diastolic (congestive) heart failure: Secondary | ICD-10-CM | POA: Diagnosis not present

## 2020-02-06 DIAGNOSIS — E7800 Pure hypercholesterolemia, unspecified: Secondary | ICD-10-CM

## 2020-02-06 DIAGNOSIS — G4733 Obstructive sleep apnea (adult) (pediatric): Secondary | ICD-10-CM

## 2020-02-06 DIAGNOSIS — I10 Essential (primary) hypertension: Secondary | ICD-10-CM

## 2020-02-06 DIAGNOSIS — I482 Chronic atrial fibrillation, unspecified: Secondary | ICD-10-CM

## 2020-02-06 MED ORDER — METOPROLOL TARTRATE 25 MG PO TABS
25.0000 mg | ORAL_TABLET | Freq: Two times a day (BID) | ORAL | 3 refills | Status: DC
Start: 2020-02-06 — End: 2020-03-25

## 2020-02-06 MED ORDER — FUROSEMIDE 40 MG PO TABS: 40.0000 mg | ORAL_TABLET | Freq: Two times a day (BID) | ORAL | 3 refills | Status: DC

## 2020-02-06 MED ORDER — AMLODIPINE BESYLATE 5 MG PO TABS: 7.5000 mg | ORAL_TABLET | Freq: Every day | ORAL | 3 refills | Status: DC

## 2020-02-06 NOTE — Patient Instructions (Signed)
Reduce metoprolol to 25 mg twice a day  Increase lasix to 40 mg twice a day  You really need to do a better job or restricting your salt intake  We will schedule you for an Echocardiogram  We will follow up in 3-4 weeks.

## 2020-02-10 ENCOUNTER — Other Ambulatory Visit: Payer: Self-pay

## 2020-02-10 LAB — BASIC METABOLIC PANEL
BUN/Creatinine Ratio: 11 (ref 10–24)
BUN: 14 mg/dL (ref 8–27)
CO2: 23 mmol/L (ref 20–29)
Calcium: 9.4 mg/dL (ref 8.6–10.2)
Chloride: 104 mmol/L (ref 96–106)
Creatinine, Ser: 1.23 mg/dL (ref 0.76–1.27)
GFR calc Af Amer: 64 mL/min/{1.73_m2} (ref >59)
GFR calc non Af Amer: 55 mL/min/{1.73_m2} — ABNORMAL LOW (ref >59)
Glucose: 96 mg/dL (ref 65–99)
Potassium: 4.3 mmol/L (ref 3.5–5.2)
Sodium: 143 mmol/L (ref 134–144)

## 2020-02-10 LAB — BRAIN NATRIURETIC PEPTIDE: BNP: 835.3 pg/mL — ABNORMAL HIGH (ref 0.0–100.0)

## 2020-02-19 ENCOUNTER — Other Ambulatory Visit (HOSPITAL_COMMUNITY): Payer: TRICARE For Life (TFL)

## 2020-02-19 ENCOUNTER — Encounter: Payer: Self-pay | Admitting: Internal Medicine

## 2020-02-19 DIAGNOSIS — Z20822 Contact with and (suspected) exposure to covid-19: Secondary | ICD-10-CM | POA: Diagnosis not present

## 2020-02-20 ENCOUNTER — Telehealth (INDEPENDENT_AMBULATORY_CARE_PROVIDER_SITE_OTHER): Payer: Medicare Other | Admitting: Internal Medicine

## 2020-02-20 ENCOUNTER — Encounter: Payer: Self-pay | Admitting: Internal Medicine

## 2020-02-20 ENCOUNTER — Other Ambulatory Visit: Payer: TRICARE For Life (TFL)

## 2020-02-20 DIAGNOSIS — U071 COVID-19: Secondary | ICD-10-CM

## 2020-02-20 NOTE — Assessment & Plan Note (Signed)
Acute Symptoms started yesterday, home test positive yesterday Experiencing cough, weakness, worsening of chronic shortness of breath, some mild congestion He is fully vaccinated with booster Oxygen level 97% at home At this point his symptoms are mild, but I am concerned about his significant weakness.  Discussed with him and his family symptomatic treatment with over-the-counter cold medications I am concerned about complications because of his multiple comorbidities Referral placed for outpatient treatment Discussed with wife to monitor his oxygenation, shortness of breath and weakness and if he worsens or she is not able to care for him at home she may need to take him to the emergency room

## 2020-02-20 NOTE — Progress Notes (Signed)
Virtual Visit via telephone note  I connected with Samuel Moyer. on 02/20/20 at 10:45 AM EST by telephone and verified that I am speaking with the correct person using two identifiers.   I discussed the limitations of evaluation and management by telemedicine and the availability of in person appointments. The patient expressed understanding and agreed to proceed.  Present for the visit:  Myself, Dr [name], Sena Hitch, his wife Sunday Spillers, his son Jahbari.  All provided some history..  The patient is currently at home and I am in the office.    No referring provider.    History of Present Illness: This is an acute visit for covid  He started with symptoms yesterday, 1/20.  His home test yesterday came back positive.  He is experiencing low-grade fever, significant cough and weakness.  It took 2 people to get him up out of his chair and helped him to the bathroom.  His oxygen at home has been 97% on room air.  He has had some congestion.  He has chronic shortness of breath-it is hard to know if it is worse or not.  2 nights ago he did vomit.  Yesterday he had some tremors or shakes in his hands.  He is a poor historian.     Review of Systems  Constitutional: Positive for fever (99.5) and malaise/fatigue.  HENT: Positive for congestion. Negative for sore throat.   Respiratory: Positive for cough and shortness of breath. Negative for sputum production and wheezing.   Gastrointestinal: Positive for vomiting (2 nights).  Musculoskeletal:       Sore R elbow  Neurological: Positive for tremors (hands). Negative for headaches.     Social History   Socioeconomic History  . Marital status: Married    Spouse name: Not on file  . Number of children: 3  . Years of education: Not on file  . Highest education level: Not on file  Occupational History  . Occupation: Retired    Fish farm manager: RETIRED    Comment: [employer]   Tobacco Use  . Smoking status: Former Smoker    Packs/day: 1.00     Years: 16.00    Pack years: 16.00    Types: Cigarettes    Quit date: 06/30/1977    Years since quitting: 42.6  . Smokeless tobacco: Never Used  Vaping Use  . Vaping Use: Never used  Substance and Sexual Activity  . Alcohol use: No  . Drug use: No  . Sexual activity: Not on file  Other Topics Concern  . Not on file  Social History Narrative   2 caffeine drinks daily    Social Determinants of Health   Financial Resource Strain: Low Risk   . Difficulty of Paying Living Expenses: Not hard at all  Food Insecurity: No Food Insecurity  . Worried About Charity fundraiser in the Last Year: Never true  . Ran Out of Food in the Last Year: Never true  Transportation Needs: No Transportation Needs  . Lack of Transportation (Medical): No  . Lack of Transportation (Non-Medical): No  Physical Activity: Inactive  . Days of Exercise per Week: 0 days  . Minutes of Exercise per Session: 0 min  Stress: No Stress Concern Present  . Feeling of Stress : Not at all  Social Connections: Not on file      Assessment and Plan:  See Problem List for Assessment and Plan of chronic medical problems.   Follow Up Instructions:    I discussed  the assessment and treatment plan with the patient. The patient was provided an opportunity to ask questions and all were answered. The patient agreed with the plan and demonstrated an understanding of the instructions.   The patient was advised to call back or seek an in-person evaluation if the symptoms worsen or if the condition fails to improve as anticipated.  Time spent on telephone call: 13 minutes.  Binnie Rail, MD

## 2020-02-21 ENCOUNTER — Other Ambulatory Visit: Payer: Medicare Other

## 2020-02-22 ENCOUNTER — Other Ambulatory Visit: Payer: Self-pay | Admitting: Internal Medicine

## 2020-02-23 ENCOUNTER — Other Ambulatory Visit: Payer: Self-pay

## 2020-02-23 MED ORDER — ATORVASTATIN CALCIUM 10 MG PO TABS: 10.0000 mg | ORAL_TABLET | Freq: Every day | ORAL | 3 refills | Status: DC

## 2020-02-24 ENCOUNTER — Telehealth: Payer: Self-pay

## 2020-02-24 NOTE — Telephone Encounter (Signed)
[facility] returned call to pt spouse Sunday Spillers. Pt contacted on advice of PCP. LVM for pt requesting return call.   Pt referral for COVID treatment placed yesterday by Dr. Quay Burow, PCP.  Treatment center will contact pt for screening and scheduling treatment. Follow up with pt after treatment.

## 2020-03-01 ENCOUNTER — Ambulatory Visit: Payer: TRICARE For Life (TFL) | Admitting: Cardiology

## 2020-03-02 ENCOUNTER — Ambulatory Visit: Payer: Medicare Other | Admitting: Diagnostic Neuroimaging

## 2020-03-04 ENCOUNTER — Other Ambulatory Visit: Payer: Self-pay

## 2020-03-04 ENCOUNTER — Ambulatory Visit (INDEPENDENT_AMBULATORY_CARE_PROVIDER_SITE_OTHER): Payer: Medicare Other | Admitting: Pulmonary Disease

## 2020-03-04 ENCOUNTER — Encounter: Payer: Self-pay | Admitting: Pulmonary Disease

## 2020-03-04 DIAGNOSIS — G4731 Primary central sleep apnea: Secondary | ICD-10-CM

## 2020-03-04 NOTE — Assessment & Plan Note (Signed)
Lasix was increased.  He has a residual cough from Covid infection but his lungs are clear.  I not feel that he needs chest x-ray today

## 2020-03-04 NOTE — Assessment & Plan Note (Signed)
Residual AHI remains high but central events seem to have decreased.  Average pressure is 22/18 on BiPAP.  For some reason during titration study 24/20 seem to control his symptoms.  He still has a large leak but this does not affect his sleeping.  His wife sleeps in a different room .  Overall he may still require an ASV machine but since centrals are decreased I think we will persist with BiPAP.  I doubt that we will be able to control his residual AHI.  Clinically he has done well even from a cardiology standpoint, EF has improved and heart failure symptoms are controlled

## 2020-03-04 NOTE — Progress Notes (Signed)
   Subjective:    Patient ID: Samuel Moyer., male    DOB: 1940/09/02, 80 y.o.   MRN: [id number]  HPI  80 yo man with severe OSAand treatment emergent central apneas.  PMH -atrial fibrillation and chronic systolic heart failure  He had persistent treatment emergent central apneas on his BiPAP downloads .   07/2019 He continues to have a large leak on his BiPAP machine,   He also has residual AHI 40/hour with centrals 16/hour and obstructive's 8/hour, great compliance but large leak  We plan for an ASV titration study, unfortunately sleep study was performed as a BiPAP titration in the event seems to be controlled on 24/20 cm.  However download reviewed today again shows residual AHI of 38/hour, centrals are decreased to 4/hour with average pressure of 22/18 .  He still has a large leak.  He is hard of hearing and his wife sleeps in a different room.  He had Covid infection about 2 years ago his main symptom was cough he still has a morning cough and his wife was concerned  Significant tests/ events reviewed  12/2011EF 40-45%, failed cardioversion in dec'11  Echo from 01/09/2019 was reviewed which shows improved LVEF to 50 to 55%   PSG 04/06/10 (wt 200) showed severe obstructive sleep apnea with AHI 66/h, nadir desatn 75% corrrected by CPAP 10 cm to AHI 4.5/h. Higher pressures were associated with emergence of central events.   09/25/2018 - CPAPtitration>>26/22 cm auto bipap _Auto bipap EPAP min 12 , IPAP max 26, PS +4 cm 12/2019 Titration >> 24/20 cm  Review of Systems neg for any significant sore throat, dysphagia, itching, sneezing, nasal congestion or excess/ purulent secretions, fever, chills, sweats, unintended wt loss, pleuritic or exertional cp, hempoptysis, orthopnea pnd or change in chronic leg swelling. Also denies presyncope, palpitations, heartburn, abdominal pain, nausea, vomiting, diarrhea or change in bowel or urinary habits, dysuria,hematuria, rash,  arthralgias, visual complaints, headache, numbness weakness or ataxia.     Objective:   Physical Exam  Gen. Pleasant, obese, in no distress ENT - no lesions, no post nasal drip Neck: No JVD, no thyromegaly, no carotid bruits Lungs: no use of accessory muscles, no dullness to percussion, decreased without rales or rhonchi  Cardiovascular: Rhythm regular, heart sounds  normal, no murmurs or gallops, 1+ peripheral edema Musculoskeletal: No deformities, no cyanosis or clubbing , no tremors       Assessment & Plan:

## 2020-03-04 NOTE — Patient Instructions (Signed)
BiPAP supplies will be renewed

## 2020-03-07 ENCOUNTER — Ambulatory Visit
Admission: RE | Admit: 2020-03-07 | Discharge: 2020-03-07 | Disposition: A | Discharge status: Home / Self Care | Payer: Medicare Other | Source: Ambulatory Visit | Attending: Neurological Surgery | Admitting: Neurological Surgery

## 2020-03-07 ENCOUNTER — Other Ambulatory Visit: Payer: Self-pay

## 2020-03-07 DIAGNOSIS — D329 Benign neoplasm of meninges, unspecified: Secondary | ICD-10-CM

## 2020-03-07 MED ORDER — GADOBENATE DIMEGLUMINE 529 MG/ML IV SOLN
19.0000 mL | Freq: Once | INTRAVENOUS | Status: AC | PRN
  Administered 2020-03-07: 19 mL via INTRAVENOUS

## 2020-03-10 ENCOUNTER — Other Ambulatory Visit: Payer: Self-pay

## 2020-03-10 ENCOUNTER — Ambulatory Visit (HOSPITAL_COMMUNITY): Payer: Medicare Other | Attending: Cardiology

## 2020-03-10 DIAGNOSIS — I10 Essential (primary) hypertension: Secondary | ICD-10-CM

## 2020-03-10 DIAGNOSIS — E7800 Pure hypercholesterolemia, unspecified: Secondary | ICD-10-CM | POA: Diagnosis not present

## 2020-03-10 DIAGNOSIS — I5043 Acute on chronic combined systolic (congestive) and diastolic (congestive) heart failure: Secondary | ICD-10-CM | POA: Diagnosis not present

## 2020-03-10 DIAGNOSIS — I482 Chronic atrial fibrillation, unspecified: Secondary | ICD-10-CM | POA: Diagnosis not present

## 2020-03-10 DIAGNOSIS — G4733 Obstructive sleep apnea (adult) (pediatric): Secondary | ICD-10-CM

## 2020-03-10 LAB — ECHOCARDIOGRAM COMPLETE
Area-P 1/2: 5.84 cm2
MV M vel: 5.04 m/s
MV Peak grad: 101.6 mmHg
P 1/2 time: 536 msec
Radius: 0.4 cm
S' Lateral: 4.2 cm

## 2020-03-12 NOTE — H&P (View-Only) (Signed)
Samuel Moyer. Date of Birth: 09-01-1940   History of Present Illness: Samuel Moyer is seen today for followup of CHF and atrial fibrillation. He has a history of permanent atrial fibrillation. He also is a history of congestive heart failure with ejection fraction of 40-45%. Normal myoview in 2011 with EF of 50% at that time. He does have OSA on CPAP and is followed by pulmonary.   In late February 2021 he noted increased SOB. Was seen by Dr Quay Burow and BNP was elevated at 772 which was above his baseline of 600. His lasix was increased for a few days. He states he really didn't notice much change in his breathing. Echo showed good EF 50-55%, severe biatrial enlargement in setting of chronic AFib, and moderate pulmonary HTN. The RV was noted to be moderately enlarged with normal systolic function.  He initially did well and was able to work [place] the [employer] this summer walking up stairs and only getting SOB when he got to the top. More recently he has noted increased dyspnea with activity such as going up hill or carrying something. Weight is up 10 lbs on his scales. He eats out a lot and eats a lot of processed meats. He did hit his head on his bedside table in December. CT showed ? Hematoma versus meningioma. Is scheduled for MRI by Neuro to characterize better. He had a recent sleep study by Dr Elsworth Soho and is on Bipap.    On his last visit we increased his lasix and an Echo was ordered. This demonstrated EF 45-50% with regional wall motion abnormalities. Severe pulmonary HTN. BNP was elevated to 835. We also reduced his metoprolol due to bradycardia.   On follow up today he does note increased urine output when he takes his lasix but weight and SOB are unchanged. He has an early morning cough that he relates to post nasal drip. No chest pain.   Current Outpatient Medications on File Prior to Visit  Medication Sig Dispense Refill  . allopurinol (ZYLOPRIM) 300 MG tablet Take 1 tablet (300 mg total)  by mouth daily. To lower uric acid for gout 90 tablet 1  . ALPRAZolam (XANAX) 0.25 MG tablet SMARTSIG:1 Tablet(s) By Mouth    . amLODipine (NORVASC) 5 MG tablet Take 1.5 tablets (7.5 mg total) by mouth daily. 135 tablet 3  . atorvastatin (LIPITOR) 10 MG tablet Take 1 tablet (10 mg total) by mouth daily. 90 tablet 3  . benazepril (LOTENSIN) 40 MG tablet TAKE 1 TABLET DAILY (Patient taking differently: Take 40 mg by mouth daily.) 90 tablet 1  . clonazePAM (KLONOPIN) 0.5 MG tablet Take 0.5 tablets (0.25 mg total) by mouth 2 (two) times daily. 30 tablet 2  . colchicine 0.6 MG tablet Take 2 tabs po once then 1 tab one hour later for gout flair 30 tablet 1  . dabigatran (PRADAXA) 150 MG CAPS capsule TAKE 1 CAPSULE EVERY 12 HOURS (Patient taking differently: Take 150 mg by mouth 2 (two) times daily.) 180 capsule 3  . furosemide (LASIX) 40 MG tablet Take 1 tablet (40 mg total) by mouth 2 (two) times daily. 90 tablet 3  . LUMIGAN 0.01 % SOLN Place 1 drop into both eyes at bedtime.     . metoprolol tartrate (LOPRESSOR) 25 MG tablet Take 1 tablet (25 mg total) by mouth 2 (two) times daily. 180 tablet 3  . Multiple Vitamin (MULTIVITAMIN) tablet Take 1 tablet by mouth at bedtime.    Marland Kitchen PARoxetine (PAXIL)  20 MG tablet Take 0.5 tablets (10 mg total) by mouth daily. 45 tablet 1  . traZODone (DESYREL) 50 MG tablet Take 0.5-1 tablets (25-50 mg total) by mouth at bedtime as needed for sleep. 30 tablet 3   No current facility-administered medications on file prior to visit.    No Known Allergies  Past Medical History:  Diagnosis Date  . A-fib ([place])   . Anxiety   . Atrial fibrillation ([place])   . CHF (congestive heart failure) ([place])   . Claustrophobia    Occasionally when flying   . Colitis   . Diabetes mellitus, type 2 ([place])   . Fracture of one rib, left side, initial encounter for closed fracture 12/27/2016   Occurred 12/21/16 after a fall at home.  Left anterior seventh rib  . Gout   . Hemorrhoids   .  Hyperlipidemia   . Hypertension   . LV dysfunction    EF 40-45%  . OSA (obstructive sleep apnea)    CPAP machine   . PVC's (premature ventricular contractions)     Past Surgical History:  Procedure Laterality Date  . CARDIOVASCULAR STRESS TEST  03/02/2010   EF 50%  . US ECHOCARDIOGRAPHY  11/15/2009   EF 40-45%    Social History   Tobacco Use  Smoking Status Former Smoker  . Packs/day: 1.00  . Years: 16.00  . Pack years: 16.00  . Types: Cigarettes  . Quit date: 06/30/1977  . Years since quitting: 42.7  Smokeless Tobacco Never Used    Social History   Substance and Sexual Activity  Alcohol Use No    Family History  Problem Relation Age of Onset  . Hypertension Father   . Heart attack Father        Age 8 (MI)  . Heart failure Father   . Colonic polyp Sister        and Father  . Stroke Mother   . Colon cancer Neg Hx   . Stomach cancer Neg Hx     Review of Systems: As noted in history of present illness  All other systems were reviewed and are negative.  Physical Exam: BP (!) 159/90   Pulse 67   Ht 5\' 8"  (1.727 m)   Wt 202 lb 12.8 oz (92 kg)   SpO2 90%   BMI 30.84 kg/m  GENERAL:  Well appearing obese WM in NAD HEENT:  PERRL, EOMI, sclera are clear. Oropharynx is clear. NECK:  No jugular venous distention, carotid upstroke brisk and symmetric, no bruits, no thyromegaly or adenopathy LUNGS: few crackles CHEST:  Unremarkable HEART:  IRRR-  PMI not displaced or sustained,S1 and S2 within normal limits, no S3, no S4: no clicks, no rubs, no murmurs ABD:  Soft, nontender. BS +, no masses or bruits. No hepatomegaly, no splenomegaly EXT:  2 + pulses throughout, 2+ lower extremity edema, no cyanosis no clubbing SKIN:  Warm and dry.  No rashes NEURO:  Alert and oriented x 3. Cranial nerves II through XII intact. PSYCH:  Cognitively intact    LABORATORY DATA: Lab Results  Component Value Date   WBC 7.5 01/08/2020   HGB 13.6 01/08/2020   HCT 43.2 01/08/2020    PLT 195 01/08/2020   GLUCOSE 96 02/09/2020   CHOL 150 10/20/2019   TRIG 53 10/20/2019   HDL 54 10/20/2019   LDLCALC 83 10/20/2019   ALT 23 01/08/2020   AST 28 01/08/2020   NA 143 02/09/2020   K 4.3 02/09/2020   CL  104 02/09/2020   CREATININE 1.23 02/09/2020   BUN 14 02/09/2020   CO2 23 02/09/2020   TSH 1.30 06/01/2016   PSA 0.63 09/12/2007   HGBA1C 6.1 (H) 10/20/2019   MICROALBUR 1.8 06/13/2013    Echo 01/09/19: IMPRESSIONS    1. Technically difficult study. Left ventricular ejection fraction, by  visual estimation, is 50 to 55%. The left ventricle has low normal  function. There is no left ventricular hypertrophy.  2. Left ventricular diastolic parameters are indeterminate.  3. Global right ventricle has normal systolic function.The right  ventricular size is moderately enlarged.  4. There is mild dilatation of the ascending aorta measuring 36 mm.  5. Right atrial size was severely dilated.  6. Left atrial size was severely dilated.  7. The aortic valve was not well visualized. Aortic valve regurgitation  is mild. No evidence of aortic valve stenosis.  8. The mitral valve is normal in structure. Mild mitral valve  regurgitation.  9. The tricuspid valve is normal in structure. Tricuspid valve  regurgitation is trivial.  10. The pulmonic valve was not well visualized. Pulmonic valve  regurgitation is not visualized.  11. The inferior vena cava is normal in size with greater than 50%  respiratory variability, suggesting right atrial pressure of 3 mmHg.  12. The tricuspid regurgitant velocity is 3.28 m/s, and with an assumed  right atrial pressure of 3 mmHg, the estimated right ventricular systolic  pressure is moderately elevated at 46.0 mmHg.   Echo 03/10/20: IMPRESSIONS    1. There is basal-mid inferior and inferolateral left ventricular  hypokinesis. Left ventricular ejection fraction, by estimation, is 45 to  50%. The left ventricle has mildly decreased  function. The left ventricle  demonstrates regional wall motion  abnormalities (see scoring diagram/findings for description). Left  ventricular diastolic function could not be evaluated. The average left  ventricular global longitudinal strain is -14.9 %. The global longitudinal  strain is abnormal.  2. Right ventricular systolic function is moderately reduced. The right  ventricular size is moderately enlarged. There is severely elevated  pulmonary artery systolic pressure.  3. Left atrial size was severely dilated.  4. Right atrial size was severely dilated.  5. The mitral valve is normal in structure. Mild to moderate mitral valve  regurgitation.  6. Tricuspid valve regurgitation is moderate.  7. The aortic valve is tricuspid. Aortic valve regurgitation is mild.  Mild aortic valve sclerosis is present, with no evidence of aortic valve  stenosis.  8. There is borderline dilatation of the ascending aorta, measuring 40  mm.  9. The inferior vena cava is normal in size with <50% respiratory  variability, suggesting right atrial pressure of 8 mmHg.   Comparison(s): A prior study was performed on 01/09/19. Endocardial  definition is better on the current study. Wall motion abnormalities were  probably present on the previous study. Estimated right atrial and  pulmonary artery pressure and right  ventricular function all appear to have worsened on the current study.    Assessment / Plan: 1. Permanent atrial fibrillation. Rate has improved with reduction in metoprolol dose.  He is on chronic anticoagulation with Pradaxa. Renal function and Hgb have been normal.  2. Acute on chronic Congestive heart failure with combined systolic and diastolic dysfunction. On  ACE inhibitor, beta blocker, and diuretic therapy. He is still  volume overloaded despite increase in lasix dose. Stressed importance of sodium restriction in his diet. EF 45-50% by Echo with severe pulmonary HTN. I have  recommended  a right and left heart cath to rule out CAD and to assess hemodynamics. May want to switch ACEi to [facility] +/- SGLT 2 inhibitor. Cardiac cath procedure and risks discussed in detail. Will need to hold Pradaxa for 48 hour prior. Plan procedure this Thursday. The procedure and risks were reviewed including but not limited to death, myocardial infarction, stroke, arrythmias, bleeding, transfusion, emergency surgery, dye allergy, or renal dysfunction. The patient voices understanding and is agreeable to proceed.   3. Hypertension, BP is elevated.  Suspect volume overload is playing a role.    4. OSA. On Bipap.  Follow up with pulmonary.

## 2020-03-12 NOTE — Progress Notes (Unsigned)
Samuel Moyer. Date of Birth: [birth year]   History of Present Illness: [name] is seen today for followup of CHF and atrial fibrillation. He has a history of permanent atrial fibrillation. He also is a history of congestive heart failure with ejection fraction of 40-45%. Normal myoview in 2011 with EF of 50% at that time. He does have OSA on CPAP and is followed by pulmonary.   In late February 2021 he noted increased SOB. Was seen by Dr Quay Burow and BNP was elevated at 772 which was above his baseline of 600. His lasix was increased for a few days. He states he really didn't notice much change in his breathing. Echo showed good EF 50-55%, severe biatrial enlargement in setting of chronic AFib, and moderate pulmonary HTN. The RV was noted to be moderately enlarged with normal systolic function.  He initially did well and was able to work at the [employer] this summer walking up stairs and only getting SOB when he got to the top. More recently he has noted increased dyspnea with activity such as going up hill or carrying something. Weight is up 10 lbs on his scales. He eats out a lot and eats a lot of processed meats. He did hit his head on his bedside table in December. CT showed ? Hematoma versus meningioma. Is scheduled for MRI by Neuro to characterize better. He had a recent sleep study by Dr Elsworth Soho and is on Bipap.    On his last visit we increased his lasix and an Echo was ordered. This demonstrated EF 45-50% with regional wall motion abnormalities. Severe pulmonary HTN. BNP was elevated to 835. We also reduced his metoprolol due to bradycardia.   On follow up today he does note increased urine output when he takes his lasix but weight and SOB are unchanged. He has an early morning cough that he relates to post nasal drip. No chest pain.   Current Outpatient Medications on File Prior to Visit  Medication Sig Dispense Refill  . allopurinol (ZYLOPRIM) 300 MG tablet Take 1 tablet (300 mg total)  by mouth daily. To lower uric acid for gout 90 tablet 1  . ALPRAZolam (XANAX) 0.25 MG tablet SMARTSIG:1 Tablet(s) By Mouth    . amLODipine (NORVASC) 5 MG tablet Take 1.5 tablets (7.5 mg total) by mouth daily. 135 tablet 3  . atorvastatin (LIPITOR) 10 MG tablet Take 1 tablet (10 mg total) by mouth daily. 90 tablet 3  . benazepril (LOTENSIN) 40 MG tablet TAKE 1 TABLET DAILY (Patient taking differently: Take 40 mg by mouth daily.) 90 tablet 1  . clonazePAM (KLONOPIN) 0.5 MG tablet Take 0.5 tablets (0.25 mg total) by mouth 2 (two) times daily. 30 tablet 2  . colchicine 0.6 MG tablet Take 2 tabs po once then 1 tab one hour later for gout flair 30 tablet 1  . dabigatran (PRADAXA) 150 MG CAPS capsule TAKE 1 CAPSULE EVERY 12 HOURS (Patient taking differently: Take 150 mg by mouth 2 (two) times daily.) 180 capsule 3  . furosemide (LASIX) 40 MG tablet Take 1 tablet (40 mg total) by mouth 2 (two) times daily. 90 tablet 3  . LUMIGAN 0.01 % SOLN Place 1 drop into both eyes at bedtime.     . metoprolol tartrate (LOPRESSOR) 25 MG tablet Take 1 tablet (25 mg total) by mouth 2 (two) times daily. 180 tablet 3  . Multiple Vitamin (MULTIVITAMIN) tablet Take 1 tablet by mouth at bedtime.    Marland Kitchen PARoxetine (PAXIL)  20 MG tablet Take 0.5 tablets (10 mg total) by mouth daily. 45 tablet 1  . traZODone (DESYREL) 50 MG tablet Take 0.5-1 tablets (25-50 mg total) by mouth at bedtime as needed for sleep. 30 tablet 3   No current facility-administered medications on file prior to visit.    No Known Allergies  Past Medical History:  Diagnosis Date  . A-fib ([place])   . Anxiety   . Atrial fibrillation ([place])   . CHF (congestive heart failure) ([place])   . Claustrophobia    Occasionally when flying   . Colitis   . Diabetes mellitus, type 2 ([place])   . Fracture of one rib, left side, initial encounter for closed fracture 12/27/2016   Occurred 12/21/16 after a fall at home.  Left anterior seventh rib  . Gout   . Hemorrhoids   .  Hyperlipidemia   . Hypertension   . LV dysfunction    EF 40-45%  . OSA (obstructive sleep apnea)    CPAP machine   . PVC's (premature ventricular contractions)     Past Surgical History:  Procedure Laterality Date  . CARDIOVASCULAR STRESS TEST  03/02/2010   EF 50%  . US ECHOCARDIOGRAPHY  11/15/2009   EF 40-45%    Social History   Tobacco Use  Smoking Status Former Smoker  . Packs/day: 1.00  . Years: 16.00  . Pack years: 16.00  . Types: Cigarettes  . Quit date: 06/30/1977  . Years since quitting: 42.7  Smokeless Tobacco Never Used    Social History   Substance and Sexual Activity  Alcohol Use No    Family History  Problem Relation Age of Onset  . Hypertension Father   . Heart attack Father        Age 81 (MI)  . Heart failure Father   . Colonic polyp Sister        and Father  . Stroke Mother   . Colon cancer Neg Hx   . Stomach cancer Neg Hx     Review of Systems: As noted in history of present illness  All other systems were reviewed and are negative.  Physical Exam: BP (!) 159/90   Pulse 67   Ht 5\' 8"  (1.727 m)   Wt 202 lb 12.8 oz (92 kg)   SpO2 90%   BMI 30.84 kg/m  GENERAL:  Well appearing obese WM in NAD HEENT:  PERRL, EOMI, sclera are clear. Oropharynx is clear. NECK:  No jugular venous distention, carotid upstroke brisk and symmetric, no bruits, no thyromegaly or adenopathy LUNGS: few crackles CHEST:  Unremarkable HEART:  IRRR-  PMI not displaced or sustained,S1 and S2 within normal limits, no S3, no S4: no clicks, no rubs, no murmurs ABD:  Soft, nontender. BS +, no masses or bruits. No hepatomegaly, no splenomegaly EXT:  2 + pulses throughout, 2+ lower extremity edema, no cyanosis no clubbing SKIN:  Warm and dry.  No rashes NEURO:  Alert and oriented x 3. Cranial nerves II through XII intact. PSYCH:  Cognitively intact    LABORATORY DATA: Lab Results  Component Value Date   WBC 7.5 01/08/2020   HGB 13.6 01/08/2020   HCT 43.2 01/08/2020    PLT 195 01/08/2020   GLUCOSE 96 02/09/2020   CHOL 150 10/20/2019   TRIG 53 10/20/2019   HDL 54 10/20/2019   LDLCALC 83 10/20/2019   ALT 23 01/08/2020   AST 28 01/08/2020   NA 143 02/09/2020   K 4.3 02/09/2020   CL  104 02/09/2020   CREATININE 1.23 02/09/2020   BUN 14 02/09/2020   CO2 23 02/09/2020   TSH 1.30 06/01/2016   PSA 0.63 09/12/2007   HGBA1C 6.1 (H) 10/20/2019   MICROALBUR 1.8 06/13/2013    Echo 01/09/19: IMPRESSIONS    1. Technically difficult study. Left ventricular ejection fraction, by  visual estimation, is 50 to 55%. The left ventricle has low normal  function. There is no left ventricular hypertrophy.  2. Left ventricular diastolic parameters are indeterminate.  3. Global right ventricle has normal systolic function.The right  ventricular size is moderately enlarged.  4. There is mild dilatation of the ascending aorta measuring 36 mm.  5. Right atrial size was severely dilated.  6. Left atrial size was severely dilated.  7. The aortic valve was not well visualized. Aortic valve regurgitation  is mild. No evidence of aortic valve stenosis.  8. The mitral valve is normal in structure. Mild mitral valve  regurgitation.  9. The tricuspid valve is normal in structure. Tricuspid valve  regurgitation is trivial.  10. The pulmonic valve was not well visualized. Pulmonic valve  regurgitation is not visualized.  11. The inferior vena cava is normal in size with greater than 50%  respiratory variability, suggesting right atrial pressure of 3 mmHg.  12. The tricuspid regurgitant velocity is 3.28 m/s, and with an assumed  right atrial pressure of 3 mmHg, the estimated right ventricular systolic  pressure is moderately elevated at 46.0 mmHg.   Echo 03/10/20: IMPRESSIONS    1. There is basal-mid inferior and inferolateral left ventricular  hypokinesis. Left ventricular ejection fraction, by estimation, is 45 to  50%. The left ventricle has mildly decreased  function. The left ventricle  demonstrates regional wall motion  abnormalities (see scoring diagram/findings for description). Left  ventricular diastolic function could not be evaluated. The average left  ventricular global longitudinal strain is -14.9 %. The global longitudinal  strain is abnormal.  2. Right ventricular systolic function is moderately reduced. The right  ventricular size is moderately enlarged. There is severely elevated  pulmonary artery systolic pressure.  3. Left atrial size was severely dilated.  4. Right atrial size was severely dilated.  5. The mitral valve is normal in structure. Mild to moderate mitral valve  regurgitation.  6. Tricuspid valve regurgitation is moderate.  7. The aortic valve is tricuspid. Aortic valve regurgitation is mild.  Mild aortic valve sclerosis is present, with no evidence of aortic valve  stenosis.  8. There is borderline dilatation of the ascending aorta, measuring 40  mm.  9. The inferior vena cava is normal in size with <50% respiratory  variability, suggesting right atrial pressure of 8 mmHg.   Comparison(s): A prior study was performed on 01/09/19. Endocardial  definition is better on the current study. Wall motion abnormalities were  probably present on the previous study. Estimated right atrial and  pulmonary artery pressure and right  ventricular function all appear to have worsened on the current study.    Assessment / Plan: 1. Permanent atrial fibrillation. Rate has improved with reduction in metoprolol dose.  He is on chronic anticoagulation with Pradaxa. Renal function and Hgb have been normal.  2. Acute on chronic Congestive heart failure with combined systolic and diastolic dysfunction. On  ACE inhibitor, beta blocker, and diuretic therapy. He is still  volume overloaded despite increase in lasix dose. Stressed importance of sodium restriction in his diet. EF 45-50% by Echo with severe pulmonary HTN. I have  recommended  a right and left heart cath to rule out CAD and to assess hemodynamics. May want to switch ACEi to [facility] +/- SGLT 2 inhibitor. Cardiac cath procedure and risks discussed in detail. Will need to hold Pradaxa for 48 hour prior. Plan procedure this Thursday. The procedure and risks were reviewed including but not limited to death, myocardial infarction, stroke, arrythmias, bleeding, transfusion, emergency surgery, dye allergy, or renal dysfunction. The patient voices understanding and is agreeable to proceed.   3. Hypertension, BP is elevated.  Suspect volume overload is playing a role.    4. OSA. On Bipap.  Follow up with pulmonary.

## 2020-03-15 ENCOUNTER — Ambulatory Visit (INDEPENDENT_AMBULATORY_CARE_PROVIDER_SITE_OTHER): Payer: Medicare Other | Admitting: Cardiology

## 2020-03-15 ENCOUNTER — Encounter: Payer: Self-pay | Admitting: Cardiology

## 2020-03-15 ENCOUNTER — Telehealth: Payer: TRICARE For Life (TFL) | Admitting: Cardiology

## 2020-03-15 ENCOUNTER — Other Ambulatory Visit: Payer: Self-pay | Admitting: Cardiology

## 2020-03-15 ENCOUNTER — Other Ambulatory Visit: Payer: Self-pay

## 2020-03-15 ENCOUNTER — Other Ambulatory Visit (HOSPITAL_COMMUNITY)
Admission: RE | Admit: 2020-03-15 | Discharge: 2020-03-15 | Disposition: A | Discharge status: Home / Self Care | Payer: Medicare Other | Source: Ambulatory Visit | Attending: Cardiology | Admitting: Cardiology

## 2020-03-15 VITALS — BP 159/90 | HR 67 | Ht 68.0 in | Wt 202.8 lb

## 2020-03-15 DIAGNOSIS — Z01812 Encounter for preprocedural laboratory examination: Secondary | ICD-10-CM | POA: Insufficient documentation

## 2020-03-15 DIAGNOSIS — I5043 Acute on chronic combined systolic (congestive) and diastolic (congestive) heart failure: Secondary | ICD-10-CM

## 2020-03-15 DIAGNOSIS — Z20822 Contact with and (suspected) exposure to covid-19: Secondary | ICD-10-CM | POA: Insufficient documentation

## 2020-03-15 DIAGNOSIS — I272 Pulmonary hypertension, unspecified: Secondary | ICD-10-CM

## 2020-03-15 DIAGNOSIS — E7800 Pure hypercholesterolemia, unspecified: Secondary | ICD-10-CM | POA: Diagnosis not present

## 2020-03-15 DIAGNOSIS — G4733 Obstructive sleep apnea (adult) (pediatric): Secondary | ICD-10-CM | POA: Diagnosis not present

## 2020-03-15 DIAGNOSIS — I10 Essential (primary) hypertension: Secondary | ICD-10-CM | POA: Diagnosis not present

## 2020-03-15 DIAGNOSIS — I482 Chronic atrial fibrillation, unspecified: Secondary | ICD-10-CM

## 2020-03-15 MED ORDER — SODIUM CHLORIDE 0.9% FLUSH: 3.0000 mL | Freq: Two times a day (BID) | INTRAVENOUS | Status: DC

## 2020-03-15 NOTE — Patient Instructions (Addendum)
Medication Instructions:  Continue same medications *If you need a refill on your cardiac medications before your next appointment, please call your pharmacy*   Lab Work: Bmet,cbc,pt today    Testing/Procedures: Cardiac Cath    Follow instructions below   Follow-Up: At [facility], you and your health needs are our priority.  As part of our continuing mission to provide you with exceptional heart care, we have created designated Provider Care Teams.  These Care Teams include your primary Cardiologist (physician) and Advanced Practice Providers (APPs -  Physician Assistants and Nurse Practitioners) who all work together to provide you with the care you need, when you need it.  We recommend signing up for the patient portal called "MyChart".  Sign up information is provided on this After Visit Summary.  MyChart is used to connect with patients for Virtual Visits (Telemedicine).  Patients are able to view lab/test results, encounter notes, upcoming appointments, etc.  Non-urgent messages can be sent to your provider as well.   To learn more about what you can do with MyChart, go to [email or web address].    Your next appointment:  Friday 04/02/20 at 8:20 am   The format for your next appointment:  Office   Provider:  [place] [address] [address] Dept: [place]: [place].  03/15/2020  You are scheduled for a Cardiac Cath on Thursday 03/18/20 , with Dr.Jordan.  1. Please arrive at the [facility] (Main Entrance A) at [facility]: [address] [place], [place] [address] at 10:00 am (This time is two hours before your procedure to ensure your preparation). Free valet parking service is available.   Special note: Every effort is made to have your procedure done on time. Please understand that emergencies sometimes delay  scheduled procedures.  2. Diet: Do not eat solid foods after midnight.  The patient may have clear liquids until 5am upon the day of the procedure.  3. Labs: Bmet,cbc,pt today at [place] office  Covid test at [facility] today at 1:25 pm  You will need to quarantine until after cath.  4. Medication instructions in preparation for your procedure:   Hold Pradaxa 2 days before cath ( None on Tue and Wed and none on morning of cath )  Hold Furosemide morning of cath     On the morning of your procedure, take your and any morning medicines NOT listed above.  You may use sips of water.  5. Plan for one night stay--bring personal belongings. 6. Bring a current list of your medications and current insurance cards. 7. You MUST have a responsible person to drive you home. 8. Someone MUST be with you the first 24 hours after you arrive home or your discharge will be delayed. 9. Please wear clothes that are easy to get on and off and wear slip-on shoes.  Thank you for allowing [place] to care for you!   -- [place] [facility]

## 2020-03-16 ENCOUNTER — Telehealth: Payer: Self-pay | Admitting: *Deleted

## 2020-03-16 LAB — CBC WITH DIFFERENTIAL/PLATELET
Basophils Absolute: 0 10*3/uL (ref 0.0–0.2)
Basos: 1 %
EOS (ABSOLUTE): 0.3 10*3/uL (ref 0.0–0.4)
Eos: 4 %
Hematocrit: 41.9 % (ref 37.5–51.0)
Hemoglobin: 14.1 g/dL (ref 13.0–17.7)
Immature Grans (Abs): 0 10*3/uL (ref 0.0–0.1)
Immature Granulocytes: 0 %
Lymphocytes Absolute: 1.3 10*3/uL (ref 0.7–3.1)
Lymphs: 16 %
MCH: 32.6 pg (ref 26.6–33.0)
MCHC: 33.7 g/dL (ref 31.5–35.7)
MCV: 97 fL (ref 79–97)
Monocytes Absolute: 0.8 10*3/uL (ref 0.1–0.9)
Monocytes: 10 %
Neutrophils Absolute: 5.7 10*3/uL (ref 1.4–7.0)
Neutrophils: 69 %
Platelets: 204 10*3/uL (ref 150–450)
RBC: 4.33 x10E6/uL (ref 4.14–5.80)
RDW: 14.6 % (ref 11.6–15.4)
WBC: 8.2 10*3/uL (ref 3.4–10.8)

## 2020-03-16 LAB — SARS CORONAVIRUS 2 (TAT 6-24 HRS): SARS Coronavirus 2: NEGATIVE

## 2020-03-16 LAB — BASIC METABOLIC PANEL
BUN/Creatinine Ratio: 12 (ref 10–24)
BUN: 13 mg/dL (ref 8–27)
CO2: 24 mmol/L (ref 20–29)
Calcium: 9.6 mg/dL (ref 8.6–10.2)
Chloride: 102 mmol/L (ref 96–106)
Creatinine, Ser: 1.07 mg/dL (ref 0.76–1.27)
GFR calc Af Amer: 75 mL/min/{1.73_m2} (ref >59)
GFR calc non Af Amer: 65 mL/min/{1.73_m2} (ref >59)
Glucose: 97 mg/dL (ref 65–99)
Potassium: 4 mmol/L (ref 3.5–5.2)
Sodium: 142 mmol/L (ref 134–144)

## 2020-03-16 LAB — PT AND PTT
INR: 1.6 — ABNORMAL HIGH (ref 0.9–1.2)
Prothrombin Time: 16.4 s — ABNORMAL HIGH (ref 9.1–12.0)
aPTT: 53 s — ABNORMAL HIGH (ref 24–33)

## 2020-03-16 NOTE — Telephone Encounter (Signed)
Pt contacted pre-catheterization scheduled at [facility] for: Thursday March 18, 2020 12 Noon Verified arrival time and place: [facility]) at: 10 AM   No solid food after midnight prior to cath, clear liquids until 5 AM day of procedure.  Hold: Pradaxa-none 03/16/20 until post procedure Lasix-AM of procedure  Except hold medications AM meds can be  taken pre-cath with sips of water including: ASA 81 mg   Confirmed patient has responsible adult to drive home post procedure and be with patient first 24 hours after arriving home: yes  You are allowed ONE visitor in the waiting room during the time you are at the hospital for your procedure. Both you and your visitor must wear a mask once you enter the hospital.  Reviewed procedure/mask/visitor instructions with patient.

## 2020-03-17 DIAGNOSIS — L570 Actinic keratosis: Secondary | ICD-10-CM | POA: Diagnosis not present

## 2020-03-17 DIAGNOSIS — Z85828 Personal history of other malignant neoplasm of skin: Secondary | ICD-10-CM | POA: Diagnosis not present

## 2020-03-17 DIAGNOSIS — L814 Other melanin hyperpigmentation: Secondary | ICD-10-CM | POA: Diagnosis not present

## 2020-03-17 DIAGNOSIS — D225 Melanocytic nevi of trunk: Secondary | ICD-10-CM | POA: Diagnosis not present

## 2020-03-17 DIAGNOSIS — C44519 Basal cell carcinoma of skin of other part of trunk: Secondary | ICD-10-CM | POA: Diagnosis not present

## 2020-03-17 DIAGNOSIS — L853 Xerosis cutis: Secondary | ICD-10-CM | POA: Diagnosis not present

## 2020-03-17 DIAGNOSIS — D0472 Carcinoma in situ of skin of left lower limb, including hip: Secondary | ICD-10-CM | POA: Diagnosis not present

## 2020-03-17 DIAGNOSIS — D0462 Carcinoma in situ of skin of left upper limb, including shoulder: Secondary | ICD-10-CM | POA: Diagnosis not present

## 2020-03-17 DIAGNOSIS — L821 Other seborrheic keratosis: Secondary | ICD-10-CM | POA: Diagnosis not present

## 2020-03-18 ENCOUNTER — Ambulatory Visit (HOSPITAL_COMMUNITY)
Admission: RE | Disposition: A | Discharge status: Home / Self Care | Payer: Self-pay | Source: Home / Self Care | Attending: Cardiology

## 2020-03-18 ENCOUNTER — Ambulatory Visit (HOSPITAL_COMMUNITY)
Admission: RE | Admit: 2020-03-18 | Discharge: 2020-03-18 | Disposition: A | Discharge status: Home / Self Care | Payer: Medicare Other | Attending: Cardiology | Admitting: Cardiology

## 2020-03-18 ENCOUNTER — Other Ambulatory Visit: Payer: Self-pay

## 2020-03-18 DIAGNOSIS — Z7901 Long term (current) use of anticoagulants: Secondary | ICD-10-CM | POA: Insufficient documentation

## 2020-03-18 DIAGNOSIS — G4733 Obstructive sleep apnea (adult) (pediatric): Secondary | ICD-10-CM | POA: Diagnosis not present

## 2020-03-18 DIAGNOSIS — Z87891 Personal history of nicotine dependence: Secondary | ICD-10-CM | POA: Diagnosis not present

## 2020-03-18 DIAGNOSIS — Z79899 Other long term (current) drug therapy: Secondary | ICD-10-CM | POA: Insufficient documentation

## 2020-03-18 DIAGNOSIS — I4821 Permanent atrial fibrillation: Secondary | ICD-10-CM | POA: Insufficient documentation

## 2020-03-18 DIAGNOSIS — I110 Hypertensive heart disease with heart failure: Secondary | ICD-10-CM | POA: Insufficient documentation

## 2020-03-18 DIAGNOSIS — I272 Pulmonary hypertension, unspecified: Secondary | ICD-10-CM | POA: Diagnosis not present

## 2020-03-18 DIAGNOSIS — I4891 Unspecified atrial fibrillation: Secondary | ICD-10-CM | POA: Diagnosis present

## 2020-03-18 DIAGNOSIS — I5043 Acute on chronic combined systolic (congestive) and diastolic (congestive) heart failure: Secondary | ICD-10-CM | POA: Diagnosis not present

## 2020-03-18 DIAGNOSIS — I2510 Atherosclerotic heart disease of native coronary artery without angina pectoris: Secondary | ICD-10-CM | POA: Diagnosis not present

## 2020-03-18 DIAGNOSIS — I10 Essential (primary) hypertension: Secondary | ICD-10-CM | POA: Diagnosis present

## 2020-03-18 DIAGNOSIS — E7800 Pure hypercholesterolemia, unspecified: Secondary | ICD-10-CM | POA: Diagnosis present

## 2020-03-18 DIAGNOSIS — I5023 Acute on chronic systolic (congestive) heart failure: Secondary | ICD-10-CM | POA: Diagnosis present

## 2020-03-18 HISTORY — PX: RIGHT/LEFT HEART CATH AND CORONARY ANGIOGRAPHY: CATH118266

## 2020-03-18 LAB — GLUCOSE, CAPILLARY
Glucose-Capillary: 110 mg/dL — ABNORMAL HIGH (ref 70–99)
Glucose-Capillary: 82 mg/dL (ref 70–99)

## 2020-03-18 LAB — POCT I-STAT EG7
Acid-Base Excess: 1 mmol/L (ref 0.0–2.0)
Bicarbonate: 25.2 mmol/L (ref 20.0–28.0)
Calcium, Ion: 1.22 mmol/L (ref 1.15–1.40)
HCT: 36 % — ABNORMAL LOW (ref 39.0–52.0)
Hemoglobin: 12.2 g/dL — ABNORMAL LOW (ref 13.0–17.0)
O2 Saturation: 61 %
Potassium: 3.5 mmol/L (ref 3.5–5.1)
Sodium: 141 mmol/L (ref 135–145)
TCO2: 26 mmol/L (ref 22–32)
pCO2, Ven: 39.2 mmHg — ABNORMAL LOW (ref 44.0–60.0)
pH, Ven: 7.416 (ref 7.250–7.430)
pO2, Ven: 31 mmHg — CL (ref 32.0–45.0)

## 2020-03-18 LAB — POCT I-STAT 7, (LYTES, BLD GAS, ICA,H+H)
Acid-base deficit: 1 mmol/L (ref 0.0–2.0)
Bicarbonate: 22.9 mmol/L (ref 20.0–28.0)
Calcium, Ion: 1.19 mmol/L (ref 1.15–1.40)
HCT: 35 % — ABNORMAL LOW (ref 39.0–52.0)
Hemoglobin: 11.9 g/dL — ABNORMAL LOW (ref 13.0–17.0)
O2 Saturation: 90 %
Potassium: 3.4 mmol/L — ABNORMAL LOW (ref 3.5–5.1)
Sodium: 141 mmol/L (ref 135–145)
TCO2: 24 mmol/L (ref 22–32)
pCO2 arterial: 33.9 mmHg (ref 32.0–48.0)
pH, Arterial: 7.439 (ref 7.350–7.450)
pO2, Arterial: 57 mmHg — ABNORMAL LOW (ref 83.0–108.0)

## 2020-03-18 SURGERY — RIGHT/LEFT HEART CATH AND CORONARY ANGIOGRAPHY
Anesthesia: LOCAL

## 2020-03-18 MED ORDER — SODIUM CHLORIDE 0.9 % IV SOLN: 250.0000 mL | INTRAVENOUS | Status: DC | PRN

## 2020-03-18 MED ORDER — FUROSEMIDE 40 MG PO TABS: ORAL_TABLET | ORAL | 3 refills | Status: DC

## 2020-03-18 MED ORDER — FENTANYL CITRATE (PF) 100 MCG/2ML IJ SOLN
INTRAMUSCULAR | Status: AC
  Filled 2020-03-18: qty 2

## 2020-03-18 MED ORDER — HEPARIN SODIUM (PORCINE) 1000 UNIT/ML IJ SOLN
INTRAMUSCULAR | Status: AC
  Filled 2020-03-18: qty 1

## 2020-03-18 MED ORDER — HEPARIN SODIUM (PORCINE) 1000 UNIT/ML IJ SOLN
INTRAMUSCULAR | Status: DC | PRN
  Administered 2020-03-18: 4500 [IU] via INTRAVENOUS

## 2020-03-18 MED ORDER — VERAPAMIL HCL 2.5 MG/ML IV SOLN
INTRAVENOUS | Status: AC
  Filled 2020-03-18: qty 2

## 2020-03-18 MED ORDER — MIDAZOLAM HCL 2 MG/2ML IJ SOLN
INTRAMUSCULAR | Status: AC
  Filled 2020-03-18: qty 2

## 2020-03-18 MED ORDER — HEPARIN (PORCINE) IN NACL 1000-0.9 UT/500ML-% IV SOLN
INTRAVENOUS | Status: DC | PRN
  Administered 2020-03-18: 500 mL

## 2020-03-18 MED ORDER — HEPARIN (PORCINE) IN NACL 1000-0.9 UT/500ML-% IV SOLN
INTRAVENOUS | Status: AC
  Filled 2020-03-18: qty 1000

## 2020-03-18 MED ORDER — ENTRESTO 49-51 MG PO TABS
1.0000 | ORAL_TABLET | Freq: Two times a day (BID) | ORAL | 6 refills | Status: DC
Start: 2020-03-18 — End: 2020-05-14

## 2020-03-18 MED ORDER — IOHEXOL 350 MG/ML SOLN
INTRAVENOUS | Status: DC | PRN
  Administered 2020-03-18: 60 mL

## 2020-03-18 MED ORDER — SODIUM CHLORIDE 0.9% FLUSH: 3.0000 mL | INTRAVENOUS | Status: DC | PRN

## 2020-03-18 MED ORDER — ACETAMINOPHEN 325 MG PO TABS: 650.0000 mg | ORAL_TABLET | ORAL | Status: DC | PRN

## 2020-03-18 MED ORDER — LIDOCAINE HCL (PF) 1 % IJ SOLN
INTRAMUSCULAR | Status: DC | PRN
  Administered 2020-03-18: 2 mL
  Administered 2020-03-18: 1 mL

## 2020-03-18 MED ORDER — ASPIRIN 81 MG PO CHEW: 81.0000 mg | CHEWABLE_TABLET | ORAL | Status: DC

## 2020-03-18 MED ORDER — ONDANSETRON HCL 4 MG/2ML IJ SOLN: 4.0000 mg | Freq: Four times a day (QID) | INTRAMUSCULAR | Status: DC | PRN

## 2020-03-18 MED ORDER — SODIUM CHLORIDE 0.9% FLUSH: 3.0000 mL | Freq: Two times a day (BID) | INTRAVENOUS | Status: DC

## 2020-03-18 MED ORDER — LIDOCAINE HCL (PF) 1 % IJ SOLN
INTRAMUSCULAR | Status: AC
  Filled 2020-03-18: qty 30

## 2020-03-18 MED ORDER — VERAPAMIL HCL 2.5 MG/ML IV SOLN
INTRAVENOUS | Status: DC | PRN
  Administered 2020-03-18: 10 mL via INTRA_ARTERIAL

## 2020-03-18 MED ORDER — SODIUM CHLORIDE 0.9 % IV SOLN: INTRAVENOUS | Status: DC

## 2020-03-18 MED ORDER — HYDRALAZINE HCL 20 MG/ML IJ SOLN: 10.0000 mg | INTRAMUSCULAR | Status: DC | PRN

## 2020-03-18 SURGICAL SUPPLY — 14 items
BAG SNAP BAND KOVER 36X36 (MISCELLANEOUS) ×1 IMPLANT
CATH 5FR JL3.5 JR4 ANG PIG MP (CATHETERS) ×1 IMPLANT
CATH BALLN WEDGE 5F 110CM (CATHETERS) ×1 IMPLANT
COVER DOME SNAP 22 D (MISCELLANEOUS) ×1 IMPLANT
DEVICE RAD COMP TR BAND LRG (VASCULAR PRODUCTS) ×1 IMPLANT
GLIDESHEATH SLEND SS 6F .021 (SHEATH) ×1 IMPLANT
GUIDEWIRE INQWIRE 1.5J.035X260 (WIRE) IMPLANT
INQWIRE 1.5J .035X260CM (WIRE) ×2
KIT HEART LEFT (KITS) ×2 IMPLANT
PACK CARDIAC CATHETERIZATION (CUSTOM PROCEDURE TRAY) ×2 IMPLANT
SHEATH GLIDE SLENDER 4/5FR (SHEATH) ×1 IMPLANT
TRANSDUCER W/STOPCOCK (MISCELLANEOUS) ×2 IMPLANT
TUBING CIL FLEX 10 FLL-RA (TUBING) ×2 IMPLANT
WIRE HI TORQ VERSACORE-J 145CM (WIRE) ×1 IMPLANT

## 2020-03-18 NOTE — Discharge Instructions (Addendum)
Radial Site Care  This sheet gives you information about how to care for yourself after your procedure. Your health care provider may also give you more specific instructions. If you have problems or questions, contact your health care provider. What can I expect after the procedure? After the procedure, it is common to have:  Bruising and tenderness at the catheter insertion area. Follow these instructions at home: Medicines  Take over-the-counter and prescription medicines only as told by your health care provider. Insertion site care  Follow instructions from your health care provider about how to take care of your insertion site. Make sure you: ? Wash your hands with soap and water before you change your bandage (dressing). If soap and water are not available, use hand sanitizer. ? Change your dressing as told by your health care provider. ? Leave stitches (sutures), skin glue, or adhesive strips in place. These skin closures may need to stay in place for 2 weeks or longer. If adhesive strip edges start to loosen and curl up, you may trim the loose edges. Do not remove adhesive strips completely unless your health care provider tells you to do that.  Check your insertion site every day for signs of infection. Check for: ? Redness, swelling, or pain. ? Fluid or blood. ? Pus or a bad smell. ? Warmth.  Do not take baths, swim, or use a hot tub until your health care provider approves.  You may shower 24-48 hours after the procedure, or as directed by your health care provider. ? Remove the dressing and gently wash the site with plain soap and water. ? Pat the area dry with a clean towel. ? Do not rub the site. That could cause bleeding.  Do not apply powder or lotion to the site. Activity  For 24 hours after the procedure, or as directed by your health care provider: ? Do not flex or bend the affected arm. ? Do not push or pull heavy objects with the affected arm. ? Do not drive  yourself home from the hospital or clinic. You may drive 24 hours after the procedure unless your health care provider tells you not to. ? Do not operate machinery or power tools.  Do not lift anything that is heavier than 10 lb (4.5 kg), or the limit that you are told, until your health care provider says that it is safe.  Ask your health care provider when it is okay to: ? Return to work or school. ? Resume usual physical activities or sports. ? Resume sexual activity.   General instructions  If the catheter site starts to bleed, raise your arm and put firm pressure on the site. If the bleeding does not stop, get help right away. This is a medical emergency.  If you went home on the same day as your procedure, a responsible adult should be with you for the first 24 hours after you arrive home.  Keep all follow-up visits as told by your health care provider. This is important. Contact a health care provider if:  You have a fever.  You have redness, swelling, or yellow drainage around your insertion site. Get help right away if:  You have unusual pain at the radial site.  The catheter insertion area swells very fast.  The insertion area is bleeding, and the bleeding does not stop when you hold steady pressure on the area.  Your arm or hand becomes pale, cool, tingly, or numb. These symptoms may represent a serious  problem that is an emergency. Do not wait to see if the symptoms will go away. Get medical help right away. Call your local emergency services (911 in the [place] Do not drive yourself to the hospital. Summary  After the procedure, it is common to have bruising and tenderness at the site.  Follow instructions from your health care provider about how to take care of your radial site wound. Check the wound every day for signs of infection.  Do not lift anything that is heavier than 10 lb (4.5 kg), or the limit that you are told, until your health care provider says that it  is safe. This information is not intended to replace advice given to you by your health care provider. Make sure you discuss any questions you have with your health care provider. Document Revised: 02/21/2017 Document Reviewed: 02/21/2017 Elsevier Patient Education  2021 [employer].   Stop taking lisinopril now  Increase lasix to 80 mg in the morning and 40 mg in the afternoon.   On Monday Feb 21 start taking Entresto 49/51 mg twice a day.   Keep follow up appointment on March 4.

## 2020-03-18 NOTE — Interval H&P Note (Signed)
History and Physical Interval Note:  03/18/2020 12:39 PM  Samuel Moyer.  has presented today for surgery, with the diagnosis of heart failure.  The various methods of treatment have been discussed with the patient and family. After consideration of risks, benefits and other options for treatment, the patient has consented to  Procedure(s): RIGHT/LEFT HEART CATH AND CORONARY ANGIOGRAPHY ([name]) as a surgical intervention.  The patient's history has been reviewed, patient examined, no change in status, stable for surgery.  I have reviewed the patient's chart and labs.  Questions were answered to the patient's satisfaction.     Collier Salina [facility] 03/18/2020 12:40 PM

## 2020-03-18 NOTE — Progress Notes (Signed)
On arrival from cath lab, O2 sat 88 and O2 started at 2l/min

## 2020-03-19 ENCOUNTER — Encounter (HOSPITAL_COMMUNITY): Payer: Self-pay | Admitting: Cardiology

## 2020-03-19 MED FILL — Fentanyl Citrate Preservative Free (PF) Inj 100 MCG/2ML: INTRAMUSCULAR | Qty: 2 | Status: AC

## 2020-03-19 MED FILL — Midazolam HCl Inj 2 MG/2ML (Base Equivalent): INTRAMUSCULAR | Qty: 2 | Status: AC

## 2020-03-22 ENCOUNTER — Telehealth: Payer: Self-pay | Admitting: Cardiology

## 2020-03-22 NOTE — Telephone Encounter (Signed)
Appropriate that he is seen. The only change we made was to increase his lasix and start Entresto. We can still shoot for prior auth.   Kahner Yanik Martinique MD, [facility]

## 2020-03-22 NOTE — Telephone Encounter (Signed)
Spoke to patient and wife.Wife stated husband is worse since he had heart cath.His sob is worse.He complains of dizziness.Swelling in lower legs no worse.Weight stable.He never started [place] too expensive.Stated Delene Loll will need a prior authorization.Stated she would like him to be seen.He is not his self.Appointment scheduled with Sande Rives PA 2/23 at 3:15 pm.Advised I will send message to our prior authorization nurse.I will also send message to [place].

## 2020-03-22 NOTE — Telephone Encounter (Signed)
Pt c/o medication issue:  1. Name of Medication: sacubitril-valsartan (ENTRESTO) 49-51 MG  2. How are you currently taking this medication (dosage and times per day)? Patient has not started this medication  3. Are you having a reaction (difficulty breathing--STAT)? No   4. What is your medication issue?   Patient is following up regarding the Rx for this medication.  He states he went to the pharmacy to pick it up, but it is $800 and he is unable to afford. He was told his insurance will cover it if Dr. Martinique can provide a prior authorization for [employer].  Patient reports for the past few weeks he has also been dizzy and SOB with exertion.   STAT if patient feels like he/she is going to faint   1) Are you dizzy now? No, but patient gets dizzy and SOB when standing  2) Do you feel faint or have you passed out? No   3) Do you have any other symptoms? SOB   4) Have you checked your HR and BP (record if available)?   Pt c/o Shortness Of Breath: STAT if SOB developed within the last 24 hours or pt is noticeably SOB on the phone  1. Are you currently SOB (can you hear that pt is SOB on the phone)? No    2. How long have you been experiencing SOB? Past 2-3 weeks, per patient   3. Are you SOB when sitting or when up moving around? When up and moving aorund  4. Are you currently experiencing any other symptoms? No

## 2020-03-23 ENCOUNTER — Encounter: Payer: Self-pay | Admitting: Diagnostic Neuroimaging

## 2020-03-23 ENCOUNTER — Ambulatory Visit (INDEPENDENT_AMBULATORY_CARE_PROVIDER_SITE_OTHER): Payer: Medicare Other | Admitting: Diagnostic Neuroimaging

## 2020-03-23 VITALS — BP 148/83 | HR 68 | Ht 68.0 in | Wt 203.2 lb

## 2020-03-23 DIAGNOSIS — F0391 Unspecified dementia with behavioral disturbance: Secondary | ICD-10-CM

## 2020-03-23 DIAGNOSIS — R441 Visual hallucinations: Secondary | ICD-10-CM

## 2020-03-23 DIAGNOSIS — F03918 Unspecified dementia, unspecified severity, with other behavioral disturbance: Secondary | ICD-10-CM

## 2020-03-23 NOTE — Telephone Encounter (Signed)
**Note De-Identified  Obfuscation** I started a Entresto PA through covermymeds. Key: [id number]

## 2020-03-23 NOTE — Progress Notes (Signed)
[facility]  PATIENT: Samuel Moyer. DOB: 28-Feb-1940  REFERRING CLINICIAN: Binnie Rail, MD HISTORY FROM: patient and wife  REASON FOR VISIT: new consult    HISTORICAL  CHIEF COMPLAINT:  Chief Complaint  Patient presents with  . Visual hallucinatoins, memory difficulties    Rm 6 New Pt  wifeSunday Spillers  MMSE 26    HISTORY OF PRESENT ILLNESS:   80 year old male with 1 year of intermittent visual hallucinations.  He sees formed visual hallucinations such as seeing a nurse, IBD, [facility], and his home.  Symptoms seem to affect him more when he is waking up out of sleep or when it is dark outside.  Episodes are occurring on a monthly basis.  In December 2021 patient fell down out of his bed.  After a few days he was having disorientation of his location and not able to recognize certain family members.  He went to the hospital for evaluation and CT scan of the head was unremarkable for any acute findings.  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  In January February 2022 patient is having more problems with his cardiac and pulmonary issues.  He underwent cardiac catheterization recently.  He has been having more problems with shortness of breath and fatigue.  Last 3 weeks his hallucinations have slightly improved.  He has been having some issues with ADLs, getting lost driving, memory problems and other confusion.  Patient's wife tries to help him with medication but he has a hard time understanding.  Patient also is hard of hearing and has deteriorating vision.  Patient's wife herself has medical pain issues.  Their children are helping them as much as possible.   REVIEW OF SYSTEMS: Full 14 system review of systems performed and negative with exception of: As per HPI.  ALLERGIES: No Known Allergies  HOME MEDICATIONS: Outpatient Medications Prior to Visit  Medication Sig Dispense Refill  . allopurinol (ZYLOPRIM) 300 MG tablet Take 1 tablet (300  mg total) by mouth daily. To lower uric acid for gout (Patient taking differently: Take 300 mg by mouth daily as needed (gout flare).) 90 tablet 1  . amLODipine (NORVASC) 5 MG tablet Take 1.5 tablets (7.5 mg total) by mouth daily. 135 tablet 3  . atorvastatin (LIPITOR) 10 MG tablet Take 1 tablet (10 mg total) by mouth daily. 90 tablet 3  . clonazePAM (KLONOPIN) 0.5 MG tablet Take 0.5 tablets (0.25 mg total) by mouth 2 (two) times daily. (Patient taking differently: Take 0.25 mg by mouth at bedtime.) 30 tablet 2  . colchicine 0.6 MG tablet Take 2 tabs po once then 1 tab one hour later for gout flair (Patient taking differently: Take 0.6-1.2 mg by mouth See admin instructions. Take 1.2 mg once then 0.6 mg one hour later for gout flair) 30 tablet 1  . dabigatran (PRADAXA) 150 MG CAPS capsule TAKE 1 CAPSULE EVERY 12 HOURS (Patient taking differently: Take 150 mg by mouth 2 (two) times daily.) 180 capsule 3  . furosemide (LASIX) 40 MG tablet Take 80 mg in the morning and 40 mg in the afternoon. 270 tablet 3  . LUMIGAN 0.01 % SOLN Place 1 drop into both eyes at bedtime.     . metoprolol tartrate (LOPRESSOR) 25 MG tablet Take 1 tablet (25 mg total) by mouth 2 (two) times daily. 180 tablet 3  . Multiple Vitamin (MULTIVITAMIN) tablet Take 1 tablet by mouth every evening.    Marland Kitchen PARoxetine (PAXIL) 20 MG tablet Take  0.5 tablets (10 mg total) by mouth daily. 45 tablet 1  . traZODone (DESYREL) 50 MG tablet Take 0.5-1 tablets (25-50 mg total) by mouth at bedtime as needed for sleep. (Patient taking differently: Take 25 mg by mouth at bedtime.) 30 tablet 3  . sacubitril-valsartan (ENTRESTO) 49-51 MG Take 1 tablet by mouth 2 (two) times daily. Start taking on Monday 03/22/20. (Patient not taking: Reported on 03/23/2020) 60 tablet 6   Facility-Administered Medications Prior to Visit  Medication Dose Route Frequency Provider Last Rate Last Admin  . sodium chloride flush (NS) 0.9 % injection 3 mL  3 mL Intravenous Q12H  Martinique, Peter M, MD        PAST MEDICAL HISTORY: Past Medical History:  Diagnosis Date  . A-fib ([place])   . Anxiety   . Atrial fibrillation ([place])   . CHF (congestive heart failure) ([place])   . Claustrophobia    Occasionally when flying   . Colitis   . Diabetes mellitus, type 2 ([place])   . Fracture of one rib, left side, initial encounter for closed fracture 12/27/2016   Occurred 12/21/16 after a fall at home.  Left anterior seventh rib  . Gout   . Hemorrhoids   . Hyperlipidemia   . Hypertension   . LV dysfunction    EF 40-45%  . OSA (obstructive sleep apnea)    CPAP machine   . PVC's (premature ventricular contractions)   . Skin cancer     PAST SURGICAL HISTORY: Past Surgical History:  Procedure Laterality Date  . CARDIOVASCULAR STRESS TEST  03/02/2010   EF 50%  . MOHS SURGERY    . RIGHT/LEFT HEART CATH AND CORONARY ANGIOGRAPHY [name] 03/18/2020   Procedure: RIGHT/LEFT HEART CATH AND CORONARY ANGIOGRAPHY;  Surgeon: Martinique, Peter M, MD;  Location: [facility];  Service: Cardiovascular;  Laterality: [name];  . US ECHOCARDIOGRAPHY  11/15/2009   EF 40-45%    FAMILY HISTORY: Family History  Problem Relation Age of Onset  . Hypertension Father   . Heart attack Father        Age 62 (MI)  . Heart failure Father   . Colonic polyp Sister        and Father  . Stroke Mother   . Colon cancer Neg Hx   . Stomach cancer Neg Hx     SOCIAL HISTORY: Social History   Socioeconomic History  . Marital status: Married    Spouse name: Sunday Spillers  . Number of children: 3  . Years of education: Not on file  . Highest education level: Bachelor's degree (e.g., BA, AB, BS)  Occupational History  . Occupation: Retired    Fish farm manager: RETIRED    Comment: [employer]   Tobacco Use  . Smoking status: Former Smoker    Packs/day: 1.00    Years: 16.00    Pack years: 16.00    Types: Cigarettes    Quit date: 06/30/1977    Years since quitting: 42.7  . Smokeless tobacco: Never Used  Vaping Use   . Vaping Use: Never used  Substance and Sexual Activity  . Alcohol use: No  . Drug use: No  . Sexual activity: Not on file  Other Topics Concern  . Not on file  Social History Narrative   03/23/20 lives with wife   2 caffeine drinks daily    Social Determinants of Health   Financial Resource Strain: Low Risk   . Difficulty of Paying Living Expenses: Not hard at all  Food Insecurity: No  Food Insecurity  . Worried About Charity fundraiser in the Last Year: Never true  . Ran Out of Food in the Last Year: Never true  Transportation Needs: No Transportation Needs  . Lack of Transportation (Medical): No  . Lack of Transportation (Non-Medical): No  Physical Activity: Inactive  . Days of Exercise per Week: 0 days  . Minutes of Exercise per Session: 0 min  Stress: No Stress Concern Present  . Feeling of Stress : Not at all  Social Connections: Not on file  Intimate Partner Violence: Not on file     PHYSICAL EXAM  GENERAL EXAM/CONSTITUTIONAL: Vitals:  Vitals:   03/23/20 0836  BP: (!) 148/83  Pulse: 68  Weight: 203 lb 3.2 oz (92.2 kg)  Height: 5\' 8"  (1.727 m)     Body mass index is 30.9 kg/m. Wt Readings from Last 3 Encounters:  03/23/20 203 lb 3.2 oz (92.2 kg)  03/18/20 203 lb (92.1 kg)  03/15/20 202 lb 12.8 oz (92 kg)     Patient is in no distress; well developed, nourished and groomed; neck is supple  CARDIOVASCULAR:  Examination of carotid arteries is normal; no carotid bruits  Regular rate and rhythm, no murmurs  Examination of peripheral vascular system by observation and palpation is normal  EYES:  Ophthalmoscopic exam of optic discs and posterior segments is normal; no papilledema or hemorrhages  No exam data present  MUSCULOSKELETAL:  Gait, strength, tone, movements noted in Neurologic exam below  NEUROLOGIC: MENTAL STATUS:  MMSE - Mini Mental State Exam 03/23/2020  Orientation to time 3  Orientation to Place 4  Registration 3  Attention/  Calculation 5  Recall 2  Language- name 2 objects 2  Language- repeat 1  Language- follow 3 step command 3  Language- read & follow direction 1  Write a sentence 1  Copy design 1  Total score 26    awake, alert, oriented to person, place and time  recent and remote memory intact  normal attention and concentration  language fluent, comprehension intact, naming intact  fund of knowledge appropriate  CRANIAL NERVE:   2nd - no papilledema on fundoscopic exam  2nd, 3rd, 4th, 6th - pupils equal and reactive to light, visual fields full to confrontation, extraocular muscles intact, no nystagmus  5th - facial sensation symmetric  7th - facial strength symmetric  8th - hearing intact  9th - palate elevates symmetrically, uvula midline  11th - shoulder shrug symmetric  12th - tongue protrusion midline  MOTOR:   normal bulk and tone, full strength in the BUE, BLE  SENSORY:   normal and symmetric to light touch, temperature, vibration  COORDINATION:   finger-nose-finger, fine finger movements normal  REFLEXES:   deep tendon reflexes present and symmetric  GAIT/STATION:   narrow based gait     DIAGNOSTIC DATA (LABS, IMAGING, TESTING) - I reviewed patient records, labs, notes, testing and imaging myself where available.  Lab Results  Component Value Date   WBC 8.2 03/15/2020   HGB 12.2 (L) 03/18/2020   HCT 36.0 (L) 03/18/2020   MCV 97 03/15/2020   PLT 204 03/15/2020      Component Value Date/Time   NA 141 03/18/2020 1258   NA 142 03/15/2020 1123   K 3.5 03/18/2020 1258   CL 102 03/15/2020 1123   CO2 24 03/15/2020 1123   GLUCOSE 97 03/15/2020 1123   GLUCOSE 111 (H) 01/08/2020 2048   BUN 13 03/15/2020 1123   CREATININE 1.07 03/15/2020  1123   CREATININE 1.08 10/20/2019 1038   CALCIUM 9.6 03/15/2020 1123   PROT 6.9 01/08/2020 2048   ALBUMIN 3.9 01/08/2020 2048   AST 28 01/08/2020 2048   ALT 23 01/08/2020 2048   ALKPHOS 71 01/08/2020 2048    BILITOT 1.5 (H) 01/08/2020 2048   GFRNONAA 65 03/15/2020 1123   GFRNONAA >60 01/08/2020 2048   GFRNONAA 64 10/20/2019 1038   GFRAA 75 03/15/2020 1123   GFRAA 75 10/20/2019 1038   Lab Results  Component Value Date   CHOL 150 10/20/2019   HDL 54 10/20/2019   LDLCALC 83 10/20/2019   TRIG 53 10/20/2019   CHOLHDL 2.8 10/20/2019   Lab Results  Component Value Date   HGBA1C 6.1 (H) 10/20/2019   No results found for: VITAMINB12 Lab Results  Component Value Date   TSH 1.30 06/01/2016    03/07/20 MRI brain [I reviewed images myself and agree with interpretation. -VRP]  1. 2.7 x 1.2 x 2.6 cm avidly enhancing dural-based mass lesion over the left frontal convexity compatible with a meningioma. Recommend 6 or 12 month follow-up MRI without and with contrast to assure stability. 2. Moderate generalized atrophy and white matter disease likely reflects the sequela of chronic microvascular ischemia. 3. Chronic right maxillary sinus disease.    ASSESSMENT AND PLAN  80 y.o. year old male here with:  Dx:  1. Visual hallucinations   2. 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[place])      PLAN:  INTERMITTENT CONFUSION, VISUAL HALLUCINATIONS, MEMORY LOSS - suspect mild neurodegenerative dementia (such as dementia with lewy bodies; MMSE 26/30; however having more difficulties at home with medications, driving and other ADLs; also symptoms exacerbated by medical issues including OSA and CHF) - safety / supervision issues reviewed - daily physical activity / exercise (at least 15-30 minutes) - eat more plants / vegetables - increase social activities, brain stimulation, games, puzzles, hobbies, crafts, arts, music - aim for at least 7-8 hours sleep per night (or more) - avoid smoking and alcohol - caregiver resources provided - needs supervision with medications; caution with finances; no driving  LEFT FRONTAL MENINGIOMA  - incidental finding; follow up with  neurosurgery  Return in about 6 months (around 09/20/2020) for with NP (Amy Lomax).    Penni Bombard, MD 8/40/3754, 3:60 AM Certified in Neurology, Neurophysiology and Neuroimaging  [facility] [address], [place], [place] [address] [phone]

## 2020-03-23 NOTE — Patient Instructions (Signed)
INTERMITTENT CONFUSION, VISUAL HALLUCINATIONS, MEMORY LOSS - suspect neurodegenerative dementia (such as dementia with lewy bodies) - safety / supervision issues reviewed - daily physical activity / exercise (at least 15-30 minutes) - eat more plants / vegetables - increase social activities, brain stimulation, games, puzzles, hobbies, crafts, arts, music - aim for at least 7-8 hours sleep per night (or more) - avoid smoking and alcohol - caregiver resources provided - needs supervision with medications; caution with finances; no driving

## 2020-03-23 NOTE — Progress Notes (Signed)
Cardiology Office Note:    Date:  03/24/2020   ID:  Samuel Dalton., DOB Jun 12, 1940, MRN [id number]  PCP:  Binnie Rail, MD  Cardiologist:  [name], MD  Electrophysiologist:  None   Referring MD: Binnie Rail, MD   Chief Complaint: follow-up of CHF  History of Present Illness:    Samuel Edler. is a 80 y.o. 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 Martinique and presents today for follow-up of CHF.  Patient was seen by Dr. Martinique on 02/05/2018 at which time her reported dyspnea on exertion (such as walking up a hill or carrying something) and weight gain.He did admit to eating out a lot and eating a lot of processed meats. Lasix was increased to 40mg  twice daily. BNP was ordered and came back elevated at 835. Echo was ordered and showed LVEF of 45-50% with hypokinesis of basal-mid inferior and inferolateral walls, severe biatrial enlargement, mild to moderate MR, and moderate TR. RV also moderately enlarged with moderately reduced systolic function and severely elevated PASP. He was seen by Dr. Martinique again on 03/15/2020 for follow-up at which time he reported increased urine output with increase in Lasix but weight and shortness of breath were unchanged. Right/left cardiac catheterization was recommended for further evaluation. This was performed on 03/18/2020 and showed mild non-obstructive CAD with moderately elevated LV filling pressures (LVEDP of 32mmHg), moderate pulmonary hypertension, and preserved cardiac output. Lasix was increased to 80mg  in the morning and 40mg  in the afternoon. Lisinopril was stopped with plan to start Entresto 49/51 twice daily after 3  day washout period.   Patient called our office on 03/22/2020 with reports of worsening shortness of breath since cardiac catheterization as well as dizziness. He stated he never started the [facility] because it was too expensive. This visit was scheduled or further evaluation.   Here alone. Patient continues to have some dyspnea on exertion and bendopnea; however, he just started taking the increased dose of Lasix yesterday. He has had some orthopnea recently. The other night he had to sleep in a recliner but last night was better and he was able to sleep in the bed. Lower extremity edema is stable. His weight is down 3lbs since last office visit. No chest pain. No palpitations. He did report feeling dizzy and unsteady on his feet the day of his cardiac catheterization when he got back home but this has improved and he has had no recurrence of this. No syncope. He also reports some postnasal drip and mild productive cough with clear/brown phlegm. No abnormal bleeding on Pradaxa.  Of note, patient has had intermittent confusion and visual hallucinations recently. Head CT in 12/2019 after a fall at home showed incidental left frontal meningioma. Patient was seen by Dr. 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  Past Medical History:  Diagnosis Date  . A-fib ([place])   . Anxiety   . Atrial fibrillation ([place])   . CHF (congestive heart failure) ([place])   . Claustrophobia    Occasionally when flying   . Colitis   . Diabetes mellitus, type 2 ([place])   . Fracture of one rib, left side, initial encounter  for closed fracture 12/27/2016   Occurred 12/21/16 after a fall at home.  Left anterior seventh rib  . Gout   . Hemorrhoids   . Hyperlipidemia   . Hypertension   . LV dysfunction    EF 40-45%  . OSA (obstructive sleep apnea)    CPAP machine   . PVC's (premature ventricular contractions)   . Skin cancer     Past  Surgical History:  Procedure Laterality Date  . CARDIOVASCULAR STRESS TEST  03/02/2010   EF 50%  . MOHS SURGERY    . RIGHT/LEFT HEART CATH AND CORONARY ANGIOGRAPHY [name] 03/18/2020   Procedure: RIGHT/LEFT HEART CATH AND CORONARY ANGIOGRAPHY;  Surgeon: Martinique, Peter M, MD;  Location: [facility];  Service: Cardiovascular;  Laterality: [name];  . US ECHOCARDIOGRAPHY  11/15/2009   EF 40-45%    Current Medications: Current Meds  Medication Sig  . allopurinol (ZYLOPRIM) 300 MG tablet Take 1 tablet (300 mg total) by mouth daily. To lower uric acid for gout (Patient taking differently: Take 300 mg by mouth daily as needed (gout flare).)  . amLODipine (NORVASC) 5 MG tablet Take 1.5 tablets (7.5 mg total) by mouth daily.  Marland Kitchen atorvastatin (LIPITOR) 10 MG tablet Take 1 tablet (10 mg total) by mouth daily.  . clonazePAM (KLONOPIN) 0.5 MG tablet Take 0.5 tablets (0.25 mg total) by mouth 2 (two) times daily. (Patient taking differently: Take 0.25 mg by mouth at bedtime.)  . colchicine 0.6 MG tablet Take 2 tabs po once then 1 tab one hour later for gout flair (Patient taking differently: Take 0.6-1.2 mg by mouth See admin instructions. Take 1.2 mg once then 0.6 mg one hour later for gout flair)  . dabigatran (PRADAXA) 150 MG CAPS capsule TAKE 1 CAPSULE EVERY 12 HOURS (Patient taking differently: Take 150 mg by mouth 2 (two) times daily.)  . furosemide (LASIX) 40 MG tablet Take 80 mg in the morning and 40 mg in the afternoon.  Marland Kitchen LUMIGAN 0.01 % SOLN Place 1 drop into both eyes at bedtime.   . metoprolol tartrate (LOPRESSOR) 25 MG tablet Take 1 tablet (25 mg total) by mouth 2 (two) times daily.  . Multiple Vitamin (MULTIVITAMIN) tablet Take 1 tablet by mouth every evening.  Marland Kitchen PARoxetine (PAXIL) 20 MG tablet Take 0.5 tablets (10 mg total) by mouth daily.  . traZODone (DESYREL) 50 MG tablet Take 0.5-1 tablets (25-50 mg total) by mouth at bedtime as needed for sleep. (Patient taking differently: Take 25 mg by  mouth at bedtime.)   Current Facility-Administered Medications for the 03/24/20 encounter (Office Visit) with Darreld Mclean, PA-C  Medication  . sodium chloride flush (NS) 0.9 % injection 3 mL     Allergies:   Patient has no known allergies.   Social History   Socioeconomic History  . Marital status: Married    Spouse name: Samuel Moyer  . Number of children: 3  . Years of education: Not on file  . Highest education level: Bachelor's degree (e.g., BA, AB, BS)  Occupational History  . Occupation: Retired    Fish farm manager: RETIRED    Comment: [employer]   Tobacco Use  . Smoking status: Former Smoker    Packs/day: 1.00    Years: 16.00    Pack years: 16.00    Types: Cigarettes    Quit date: 06/30/1977    Years since quitting: 42.7  . Smokeless tobacco: Never Used  Vaping Use  . Vaping Use: Never used  Substance and Sexual Activity  .  Alcohol use: No  . Drug use: No  . Sexual activity: Not on file  Other Topics Concern  . Not on file  Social History Narrative   03/23/20 lives with wife   2 caffeine drinks daily    Social Determinants of Health   Financial Resource Strain: Low Risk   . Difficulty of Paying Living Expenses: Not hard at all  Food Insecurity: No Food Insecurity  . Worried About Charity fundraiser in the Last Year: Never true  . Ran Out of Food in the Last Year: Never true  Transportation Needs: No Transportation Needs  . Lack of Transportation (Medical): No  . Lack of Transportation (Non-Medical): No  Physical Activity: Inactive  . Days of Exercise per Week: 0 days  . Minutes of Exercise per Session: 0 min  Stress: No Stress Concern Present  . Feeling of Stress : Not at all  Social Connections: Not on file     Family History: The patient's family history includes Colonic polyp in his sister; Heart attack in his father; Heart failure in his father; Hypertension in his father; Stroke in his mother. There is no history of Colon cancer or Stomach  cancer.  ROS:   Please see the history of present illness.     EKGs/Labs/Other Studies Reviewed:    The following studies were reviewed today:  Echocardiogram 03/10/2020: Impressions: 1. There is basal-mid inferior and inferolateral left ventricular  hypokinesis. Left ventricular ejection fraction, by estimation, is 45 to  50%. The left ventricle has mildly decreased function. The left ventricle  demonstrates regional wall motion  abnormalities (see scoring diagram/findings for description). Left  ventricular diastolic function could not be evaluated. The average left  ventricular global longitudinal strain is -14.9 %. The global longitudinal  strain is abnormal.  2. Right ventricular systolic function is moderately reduced. The right  ventricular size is moderately enlarged. There is severely elevated  pulmonary artery systolic pressure.  3. Left atrial size was severely dilated.  4. Right atrial size was severely dilated.  5. The mitral valve is normal in structure. Mild to moderate mitral valve  regurgitation.  6. Tricuspid valve regurgitation is moderate.  7. The aortic valve is tricuspid. Aortic valve regurgitation is mild.  Mild aortic valve sclerosis is present, with no evidence of aortic valve  stenosis.  8. There is borderline dilatation of the ascending aorta, measuring 40  mm.  9. The inferior vena cava is normal in size with <50% respiratory  variability, suggesting right atrial pressure of 8 mmHg.   Comparison(s): A prior study was performed on 01/09/19. Endocardial  definition is better on the current study. Wall motion abnormalities were  probably present on the previous study. Estimated right atrial and  pulmonary artery pressure and right ventricular function all appear to have worsened on the current study.  EKG:  EKG not ordered today.  _______________  Right/Left Cardiac Catheterization 03/18/2020:  Prox LAD to Mid LAD lesion is 20%  stenosed.  Prox Cx to Mid Cx lesion is 15% stenosed.  Prox RCA lesion is 30% stenosed.  LV end diastolic pressure is moderately elevated.  Hemodynamic findings consistent with moderate pulmonary hypertension.   1. Mild nonobstructive CAD 2. Moderately elevated LV filling pressures. PCWP and EDP 21 mm Hg.  3. Moderate pulmonary HTN with mean PAP 42 mm Hg 4. Preserved cardiac output. Index 2.4.  Plan: will intensify medical therapy. Increase lasix to 80 mg in the morning and 40 mg in the afternoon.  Stop lisinopril. After 3 days will begin Entresto 49/51 mg daily. Will titrate as tolerated. Consider SGLT2 inhibitor. May resume Pradaxa this evening.  Diagnostic Dominance: Right     Recent Labs: 04/02/2019: Pro B Natriuretic peptide (BNP) 772.0 01/08/2020: ALT 23 02/09/2020: BNP 835.3 03/15/2020: BUN 13; Creatinine, Ser 1.07; Platelets 204 03/18/2020: Hemoglobin 12.2; Potassium 3.5; Sodium 141  Recent Lipid Panel    Component Value Date/Time   CHOL 150 10/20/2019 1038   TRIG 53 10/20/2019 1038   HDL 54 10/20/2019 1038   CHOLHDL 2.8 10/20/2019 1038   VLDL 13.0 10/17/2018 1122   LDLCALC 83 10/20/2019 1038    Physical Exam:    Vital Signs: BP (!) 147/80   Pulse 66   Ht 5\' 8"  (1.727 m)   Wt 199 lb 12.8 oz (90.6 kg)   BMI 30.38 kg/m     Wt Readings from Last 3 Encounters:  03/24/20 199 lb 12.8 oz (90.6 kg)  03/23/20 203 lb 3.2 oz (92.2 kg)  03/18/20 203 lb (92.1 kg)     General: 80 y.o. male in no acute distress. HEENT: Normocephalic and atraumatic. Sclera clear. Neck: Supple. No JVD. Heart: Irregularly irregular rhythm with normal rate. Distinct S1 and S2. No murmurs, gallops, or rubs. Radial pulses 2+ and equal bilaterally. Ecchymosis around right radial cath site but soft.  Lungs: No increased work of breathing. Clear to ausculation bilaterally. No wheezes, rhonchi, or rales.  Abdomen: Soft, non-distended, and non-tender to palpation.  Extremities: 1+ lower extremity  edema bilaterally. Skin: Warm and dry. Neuro:  No focal deficits. Psych: Normal affect. Responds appropriately.   Assessment:    1. Acute on chronic combined systolic and diastolic CHF (congestive heart failure) ([place])   2. Medication management   3. Nonischemic cardiomyopathy ([place])   4. Non-obstructive CAD   5. Permanent atrial fibrillation ([place])   6. Primary hypertension   7. Hyperlipidemia, unspecified hyperlipidemia type   8. Prediabetes   9. Obstructive sleep apnea     Plan:    Acute on Chronic Combined CHF Non-Ischemic Cardiomyopathy - Patient still reports dyspnea on exertion and bendopnea but he just started increased dose of Lasix yesterday. Weight is coming down. - Echo on 03/10/2020 was ordered and showed LVEF of 45-50% with hypokinesis of basal-mid inferior and inferolateral walls, severe biatrial enlargement, mild to moderate MR, and moderate TR. RV also moderately enlarged with moderately reduced systolic function and severely elevated PASP. - R/LHC on 03/18/2020 showed mild non-obstructive CAD with moderately elevated LV filling pressures (LVEDP of 29mmHg), moderate pulmonary hypertension, and preserved cardiac output. - Continue Lasix 80mg  in the morning and 40mg  in the evening. - Entresto 49-51mg  twice daily was prescribed after cath; however, patient was unable to afford this. Prior authorization paper has been submitted. Patient was given 30-day free card today in the meantime so he can go head and start this.  - Currently on Lopressor 25mg  twice daily. Will transition to Toprol-XL 50mg  daily.  - Discussed importance of daily weights and sodium/fluid restrictions.  - Patient has close follow-up with Dr. Martinique next week. Medications can be further titrated at that time. May be able to add SGLT inhibitor. Will need BMET checked at that visit.  Non-Obstructive CAD - Noted on recent cardiac catheterization. - No chest pain. - No aspirin due to need for Pradaxa. -  Continue statin.  Permanent Atrial Fibrillation - Rates well controlled. - Continue beta-blocker as above.  - Continue Pradaxa.  Hypertension - BP mildly elevated today. -  Continue medications for CHF as above. Also on Amlodipine 7.5mg  daily. Ok to continue for now but as we optimize CHF medications this may need to be decreased/stopped.  Hyperlipidemia - Lipid panel from 10/2019: Total Cholesterol 150, Triglycerides 53, HDL 54, LDL 83.  - On Lipitor 10mg  daily.  Pre-Diabetes - Hemoglobin A1c 6.1.  - Followed by PCP.  Obstructive Sleep Apnea - On BiPAP at night. - Followed by Pulmonology.  Disposition: Follow up next week with Dr. Martinique as scheduled.   Medication Adjustments/Labs and Tests Ordered: Current medicines are reviewed at length with the patient today.  Concerns regarding medicines are outlined above.  Orders Placed This Encounter  Procedures  . Basic Metabolic Panel (BMET)   No orders of the defined types were placed in this encounter.   Patient Instructions   Medication Instructions:  Continue current medications  *If you need a refill on your cardiac medications before your next appointment, please call your pharmacy*   Lab Work: BMP in 1 week  If you have labs (blood work) drawn today and your tests are completely normal, you will receive your results only by: Marland Kitchen MyChart Message (if you have MyChart) OR . A paper copy in the mail If you have any lab test that is abnormal or we need to change your treatment, we will call you to review the results.   Testing/Procedures: None Ordered   Follow-Up: At [facility], you and your health needs are our priority.  As part of our continuing mission to provide you with exceptional heart care, we have created designated Provider Care Teams.  These Care Teams include your primary Cardiologist (physician) and Advanced Practice Providers (APPs -  Physician Assistants and Nurse Practitioners) who all work  together to provide you with the care you need, when you need it.  We recommend signing up for the patient portal called "MyChart".  Sign up information is provided on this After Visit Summary.  MyChart is used to connect with patients for Virtual Visits (Telemedicine).  Patients are able to view lab/test results, encounter notes, upcoming appointments, etc.  Non-urgent messages can be sent to your provider as well.   To learn more about what you can do with MyChart, go to [email or web address].    Your next appointment:   Keep appointment with Dr Martinique March 4th @ 8:20 am   Other Instructions  Low-Sodium Eating Plan Sodium, which is an element that makes up salt, helps you maintain a healthy balance of fluids in your body. Too much sodium can increase your blood pressure and cause fluid and waste to be held in your body. Your health care provider or dietitian may recommend following this plan if you have high blood pressure (hypertension), kidney disease, liver disease, or heart failure. Eating less sodium can help lower your blood pressure, reduce swelling, and protect your heart, liver, and kidneys. What are tips for following this plan? Reading food labels  The Nutrition Facts label lists the amount of sodium in one serving of the food. If you eat more than one serving, you must multiply the listed amount of sodium by the number of servings.  Choose foods with less than 140 mg of sodium per serving.  Avoid foods with 300 mg of sodium or more per serving. Shopping  Look for lower-sodium products, often labeled as "low-sodium" or "no salt added."  Always check the sodium content, even if foods are labeled as "unsalted" or "no salt added."  Buy fresh foods. ? Avoid  canned foods and pre-made or frozen meals. ? Avoid canned, cured, or processed meats.  Buy breads that have less than 80 mg of sodium per slice.   Cooking  Eat more home-cooked food and less restaurant, buffet, and  fast food.  Avoid adding salt when cooking. Use salt-free seasonings or herbs instead of table salt or sea salt. Check with your health care provider or pharmacist before using salt substitutes.  Cook with plant-based oils, such as canola, sunflower, or olive oil.   Meal planning  When eating at a restaurant, ask that your food be prepared with less salt or no salt, if possible. Avoid dishes labeled as brined, pickled, cured, smoked, or made with soy sauce, miso, or teriyaki sauce.  Avoid foods that contain MSG (monosodium glutamate). MSG is sometimes added to [place] food, bouillon, and some canned foods.  Make meals that can be grilled, baked, poached, roasted, or steamed. These are generally made with less sodium. General information Most people on this plan should limit their sodium intake to 1,500-2,000 mg (milligrams) of sodium each day. What foods should I eat? Fruits Fresh, frozen, or canned fruit. Fruit juice. Vegetables Fresh or frozen vegetables. "No salt added" canned vegetables. "No salt added" tomato sauce and paste. Low-sodium or reduced-sodium tomato and vegetable juice. Grains Low-sodium cereals, including oats, puffed wheat and rice, and shredded wheat. Low-sodium crackers. Unsalted rice. Unsalted pasta. Low-sodium bread. Whole-grain breads and whole-grain pasta. Meats and other proteins Fresh or frozen (no salt added) meat, poultry, seafood, and fish. Low-sodium canned tuna and salmon. Unsalted nuts. Dried peas, beans, and lentils without added salt. Unsalted canned beans. Eggs. Unsalted nut butters. Dairy Milk. Soy milk. Cheese that is naturally low in sodium, such as ricotta cheese, fresh mozzarella, or Swiss cheese. Low-sodium or reduced-sodium cheese. Cream cheese. Yogurt. Seasonings and condiments Fresh and dried herbs and spices. Salt-free seasonings. Low-sodium mustard and ketchup. Sodium-free salad dressing. Sodium-free light mayonnaise. Fresh or refrigerated  horseradish. Lemon juice. Vinegar. Other foods Homemade, reduced-sodium, or low-sodium soups. Unsalted popcorn and pretzels. Low-salt or salt-free chips. The items listed above may not be a complete list of foods and beverages you can eat. Contact a dietitian for more information. What foods should I avoid? Vegetables Sauerkraut, pickled vegetables, and relishes. Olives. [place] fries. Onion rings. Regular canned vegetables (not low-sodium or reduced-sodium). Regular canned tomato sauce and paste (not low-sodium or reduced-sodium). Regular tomato and vegetable juice (not low-sodium or reduced-sodium). Frozen vegetables in sauces. Grains Instant hot cereals. Bread stuffing, pancake, and biscuit mixes. Croutons. Seasoned rice or pasta mixes. Noodle soup cups. Boxed or frozen macaroni and cheese. Regular salted crackers. Self-rising flour. Meats and other proteins Meat or fish that is salted, canned, smoked, spiced, or pickled. Precooked or cured meat, such as sausages or meat loaves. [place] Salines. Ham. Pepperoni. Hot dogs. Corned beef. Chipped beef. Salt pork. Jerky. Pickled herring. Anchovies and sardines. Regular canned tuna. Salted nuts. Dairy Processed cheese and cheese spreads. Hard cheeses. Cheese curds. Blue cheese. Feta cheese. String cheese. Regular cottage cheese. Buttermilk. Canned milk. Fats and oils Salted butter. Regular margarine. Ghee. Bacon fat. Seasonings and condiments Onion salt, garlic salt, seasoned salt, table salt, and sea salt. Canned and packaged gravies. Worcestershire sauce. Tartar sauce. Barbecue sauce. Teriyaki sauce. Soy sauce, including reduced-sodium. Steak sauce. Fish sauce. Oyster sauce. Cocktail sauce. Horseradish that you find on the shelf. Regular ketchup and mustard. Meat flavorings and tenderizers. Bouillon cubes. Hot sauce. Pre-made or packaged marinades. Pre-made or packaged taco seasonings. Relishes.  Regular salad dressings. Salsa. Other foods Salted popcorn and  pretzels. Corn chips and puffs. Potato and tortilla chips. Canned or dried soups. Pizza. Frozen entrees and pot pies. The items listed above may not be a complete list of foods and beverages you should avoid. Contact a dietitian for more information. Summary  Eating less sodium can help lower your blood pressure, reduce swelling, and protect your heart, liver, and kidneys.  Most people on this plan should limit their sodium intake to 1,500-2,000 mg (milligrams) of sodium each day.  Canned, boxed, and frozen foods are high in sodium. Restaurant foods, fast foods, and pizza are also very high in sodium. You also get sodium by adding salt to food.  Try to cook at home, eat more fresh fruits and vegetables, and eat less fast food and canned, processed, or prepared foods. This information is not intended to replace advice given to you by your health care provider. Make sure you discuss any questions you have with your health care provider. Document Revised: 02/21/2019 Document Reviewed: 12/18/2018 Elsevier Patient Education  2021 [place], Darreld Mclean, [place]  03/24/2020 10:19 PM    [facility]

## 2020-03-24 ENCOUNTER — Ambulatory Visit (INDEPENDENT_AMBULATORY_CARE_PROVIDER_SITE_OTHER): Payer: Medicare Other | Admitting: Student

## 2020-03-24 ENCOUNTER — Other Ambulatory Visit: Payer: Self-pay

## 2020-03-24 ENCOUNTER — Encounter: Payer: Self-pay | Admitting: Student

## 2020-03-24 VITALS — BP 147/80 | HR 66 | Ht 68.0 in | Wt 199.8 lb

## 2020-03-24 DIAGNOSIS — I428 Other cardiomyopathies: Secondary | ICD-10-CM | POA: Diagnosis not present

## 2020-03-24 DIAGNOSIS — I5043 Acute on chronic combined systolic (congestive) and diastolic (congestive) heart failure: Secondary | ICD-10-CM

## 2020-03-24 DIAGNOSIS — I4821 Permanent atrial fibrillation: Secondary | ICD-10-CM

## 2020-03-24 DIAGNOSIS — R7303 Prediabetes: Secondary | ICD-10-CM

## 2020-03-24 DIAGNOSIS — G4733 Obstructive sleep apnea (adult) (pediatric): Secondary | ICD-10-CM | POA: Diagnosis not present

## 2020-03-24 DIAGNOSIS — I2510 Atherosclerotic heart disease of native coronary artery without angina pectoris: Secondary | ICD-10-CM

## 2020-03-24 DIAGNOSIS — I10 Essential (primary) hypertension: Secondary | ICD-10-CM | POA: Diagnosis not present

## 2020-03-24 DIAGNOSIS — Z79899 Other long term (current) drug therapy: Secondary | ICD-10-CM

## 2020-03-24 DIAGNOSIS — E785 Hyperlipidemia, unspecified: Secondary | ICD-10-CM

## 2020-03-24 NOTE — Patient Instructions (Signed)
Medication Instructions:  Continue current medications  *If you need a refill on your cardiac medications before your next appointment, please call your pharmacy*   Lab Work: BMP in 1 week  If you have labs (blood work) drawn today and your tests are completely normal, you will receive your results only by: Marland Kitchen MyChart Message (if you have MyChart) OR . A paper copy in the mail If you have any lab test that is abnormal or we need to change your treatment, we will call you to review the results.   Testing/Procedures: None Ordered   Follow-Up: At [facility], you and your health needs are our priority.  As part of our continuing mission to provide you with exceptional heart care, we have created designated Provider Care Teams.  These Care Teams include your primary Cardiologist (physician) and Advanced Practice Providers (APPs -  Physician Assistants and Nurse Practitioners) who all work together to provide you with the care you need, when you need it.  We recommend signing up for the patient portal called "MyChart".  Sign up information is provided on this After Visit Summary.  MyChart is used to connect with patients for Virtual Visits (Telemedicine).  Patients are able to view lab/test results, encounter notes, upcoming appointments, etc.  Non-urgent messages can be sent to your provider as well.   To learn more about what you can do with MyChart, go to [email or web address].    Your next appointment:   Keep appointment with Dr Martinique March 4th @ 8:20 am   Other Instructions  Low-Sodium Eating Plan Sodium, which is an element that makes up salt, helps you maintain a healthy balance of fluids in your body. Too much sodium can increase your blood pressure and cause fluid and waste to be held in your body. Your health care provider or dietitian may recommend following this plan if you have high blood pressure (hypertension), kidney disease, liver disease, or heart failure. Eating  less sodium can help lower your blood pressure, reduce swelling, and protect your heart, liver, and kidneys. What are tips for following this plan? Reading food labels  The Nutrition Facts label lists the amount of sodium in one serving of the food. If you eat more than one serving, you must multiply the listed amount of sodium by the number of servings.  Choose foods with less than 140 mg of sodium per serving.  Avoid foods with 300 mg of sodium or more per serving. Shopping  Look for lower-sodium products, often labeled as "low-sodium" or "no salt added."  Always check the sodium content, even if foods are labeled as "unsalted" or "no salt added."  Buy fresh foods. ? Avoid canned foods and pre-made or frozen meals. ? Avoid canned, cured, or processed meats.  Buy breads that have less than 80 mg of sodium per slice.   Cooking  Eat more home-cooked food and less restaurant, buffet, and fast food.  Avoid adding salt when cooking. Use salt-free seasonings or herbs instead of table salt or sea salt. Check with your health care provider or pharmacist before using salt substitutes.  Cook with plant-based oils, such as canola, sunflower, or olive oil.   Meal planning  When eating at a restaurant, ask that your food be prepared with less salt or no salt, if possible. Avoid dishes labeled as brined, pickled, cured, smoked, or made with soy sauce, miso, or teriyaki sauce.  Avoid foods that contain MSG (monosodium glutamate). MSG is sometimes added to [place] food, bouillon,  and some canned foods.  Make meals that can be grilled, baked, poached, roasted, or steamed. These are generally made with less sodium. General information Most people on this plan should limit their sodium intake to 1,500-2,000 mg (milligrams) of sodium each day. What foods should I eat? Fruits Fresh, frozen, or canned fruit. Fruit juice. Vegetables Fresh or frozen vegetables. "No salt added" canned vegetables. "No  salt added" tomato sauce and paste. Low-sodium or reduced-sodium tomato and vegetable juice. Grains Low-sodium cereals, including oats, puffed wheat and rice, and shredded wheat. Low-sodium crackers. Unsalted rice. Unsalted pasta. Low-sodium bread. Whole-grain breads and whole-grain pasta. Meats and other proteins Fresh or frozen (no salt added) meat, poultry, seafood, and fish. Low-sodium canned tuna and salmon. Unsalted nuts. Dried peas, beans, and lentils without added salt. Unsalted canned beans. Eggs. Unsalted nut butters. Dairy Milk. Soy milk. Cheese that is naturally low in sodium, such as ricotta cheese, fresh mozzarella, or Swiss cheese. Low-sodium or reduced-sodium cheese. Cream cheese. Yogurt. Seasonings and condiments Fresh and dried herbs and spices. Salt-free seasonings. Low-sodium mustard and ketchup. Sodium-free salad dressing. Sodium-free light mayonnaise. Fresh or refrigerated horseradish. Lemon juice. Vinegar. Other foods Homemade, reduced-sodium, or low-sodium soups. Unsalted popcorn and pretzels. Low-salt or salt-free chips. The items listed above may not be a complete list of foods and beverages you can eat. Contact a dietitian for more information. What foods should I avoid? Vegetables Sauerkraut, pickled vegetables, and relishes. Olives. [place] fries. Onion rings. Regular canned vegetables (not low-sodium or reduced-sodium). Regular canned tomato sauce and paste (not low-sodium or reduced-sodium). Regular tomato and vegetable juice (not low-sodium or reduced-sodium). Frozen vegetables in sauces. Grains Instant hot cereals. Bread stuffing, pancake, and biscuit mixes. Croutons. Seasoned rice or pasta mixes. Noodle soup cups. Boxed or frozen macaroni and cheese. Regular salted crackers. Self-rising flour. Meats and other proteins Meat or fish that is salted, canned, smoked, spiced, or pickled. Precooked or cured meat, such as sausages or meat loaves. Berniece Salines. Ham. Pepperoni. Hot  dogs. Corned beef. Chipped beef. Salt pork. Jerky. Pickled herring. Anchovies and sardines. Regular canned tuna. Salted nuts. Dairy Processed cheese and cheese spreads. Hard cheeses. Cheese curds. Blue cheese. Feta cheese. String cheese. Regular cottage cheese. Buttermilk. Canned milk. Fats and oils Salted butter. Regular margarine. Ghee. Bacon fat. Seasonings and condiments Onion salt, garlic salt, seasoned salt, table salt, and sea salt. Canned and packaged gravies. Worcestershire sauce. Tartar sauce. Barbecue sauce. Teriyaki sauce. Soy sauce, including reduced-sodium. Steak sauce. Fish sauce. Oyster sauce. Cocktail sauce. Horseradish that you find on the shelf. Regular ketchup and mustard. Meat flavorings and tenderizers. Bouillon cubes. Hot sauce. Pre-made or packaged marinades. Pre-made or packaged taco seasonings. Relishes. Regular salad dressings. Salsa. Other foods Salted popcorn and pretzels. Corn chips and puffs. Potato and tortilla chips. Canned or dried soups. Pizza. Frozen entrees and pot pies. The items listed above may not be a complete list of foods and beverages you should avoid. Contact a dietitian for more information. Summary  Eating less sodium can help lower your blood pressure, reduce swelling, and protect your heart, liver, and kidneys.  Most people on this plan should limit their sodium intake to 1,500-2,000 mg (milligrams) of sodium each day.  Canned, boxed, and frozen foods are high in sodium. Restaurant foods, fast foods, and pizza are also very high in sodium. You also get sodium by adding salt to food.  Try to cook at home, eat more fresh fruits and vegetables, and eat less fast food and canned, processed,  or prepared foods. This information is not intended to replace advice given to you by your health care provider. Make sure you discuss any questions you have with your health care provider. Document Revised: 02/21/2019 Document Reviewed: 12/18/2018 Elsevier  Patient Education  2021 [employer].

## 2020-03-25 ENCOUNTER — Telehealth: Payer: Self-pay

## 2020-03-25 MED ORDER — METOPROLOL SUCCINATE ER 50 MG PO TB24: 50.0000 mg | ORAL_TABLET | Freq: Every day | ORAL | 3 refills | Status: DC

## 2020-03-25 NOTE — Telephone Encounter (Signed)
-----   Message from Darreld Mclean, [place] sent at 03/24/2020 10:22 PM EST ----- Regarding: Medication Change Samuel Moyer,  I saw this patient today for follow-up of CHF. After he left, I realized he was on Lopressor 25mg  twice daily. Given his reduced EF, I would like to switch him to Toprol-XL 50mg  daily. Do you mind calling patient and updating him on this change.   Thank you so much! Callie

## 2020-03-25 NOTE — Telephone Encounter (Signed)
Pt Samuel Moyer(SPOUSE) informed of providers result & recommendations. Pt verbalized understanding. No further questions . Pt is still sleeping. She will p/u new rx today. New rx sent to pharmacy

## 2020-04-01 NOTE — Progress Notes (Signed)
Cardiology Office Note:    Date:  04/02/2020   ID:  Samuel Moyer., DOB 10-10-1940, MRN [id number]  PCP:  Samuel Rail, MD  Cardiologist:  Samuel Mallicoat Martinique, MD  Electrophysiologist:  None   Referring MD: Samuel Rail, MD   Chief Complaint: follow-up of CHF  History of Present Illness:    Samuel Moyer. is a 80 y.o. 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 Martinique and presents today for follow-up of CHF.  Patient was seen by Dr. Martinique on 02/05/2018 at which time her reported dyspnea on exertion (such as walking up a hill or carrying something) and weight gain.He did admit to eating out a lot and eating a lot of processed meats. Lasix was increased to 40mg  twice daily. BNP was ordered and came back elevated at 835. Echo was ordered and showed LVEF of 45-50% with hypokinesis of basal-mid inferior and inferolateral walls, severe biatrial enlargement, mild to moderate MR, and moderate TR. RV also moderately enlarged with moderately reduced systolic function and severely elevated PASP. He was seen by Dr. Martinique again on 03/15/2020 for follow-up at which time he reported increased urine output with increase in Lasix but weight and shortness of breath were unchanged. Right/left cardiac catheterization was recommended for further evaluation. This was performed on 03/18/2020 and showed mild non-obstructive CAD with moderately elevated LV filling pressures (LVEDP of 49mmHg), moderate pulmonary hypertension, and preserved cardiac output. Lasix was increased to 80mg  in the morning and 40mg  in the afternoon. Lisinopril was stopped with plan to start Entresto 49/51 twice daily after 3  day washout period.   Patient called our office on 03/22/2020 with reports of worsening shortness of breath since cardiac catheterization as well as dizziness. He stated he never started the [facility] because it was too expensive. This visit was scheduled or further evaluation. He was given a one month supply. Has been taking Entresto for several days and is tolerating it well. Reports good diuresis with increased lasix dose but did eat out last night at [facility].    Past Medical History:  Diagnosis Date  . A-fib ([place])   . Anxiety   . Atrial fibrillation ([place])   . CHF (congestive heart failure) ([place])   . Claustrophobia    Occasionally when flying   . Colitis   . Diabetes mellitus, type 2 ([place])   . Fracture of one rib, left side, initial encounter for closed fracture 12/27/2016   Occurred 12/21/16 after a fall at home.  Left anterior seventh rib  . Gout   . Hemorrhoids   . Hyperlipidemia   . Hypertension   . LV dysfunction    EF 40-45%  . OSA (obstructive sleep apnea)    CPAP machine   . PVC's (premature ventricular contractions)   . Skin cancer     Past Surgical History:  Procedure Laterality Date  . CARDIOVASCULAR STRESS TEST  03/02/2010   EF 50%  . MOHS SURGERY    . RIGHT/LEFT HEART CATH AND CORONARY ANGIOGRAPHY [name] 03/18/2020   Procedure: RIGHT/LEFT HEART CATH AND CORONARY ANGIOGRAPHY;  Surgeon: Moyer, Samuel Kingsley M, MD;  Location: [facility];  Service: Cardiovascular;  Laterality: [name];  . US ECHOCARDIOGRAPHY  11/15/2009   EF  40-45%    Current Medications: Current Meds  Medication Sig  . allopurinol (ZYLOPRIM) 300 MG tablet Take 1 tablet (300 mg total) by mouth daily. To lower uric acid for gout (Patient taking differently: Take 300 mg by mouth daily as needed (gout flare).)  . amLODipine (NORVASC) 5 MG tablet Take 1.5 tablets (7.5 mg total) by mouth daily.  Marland Kitchen atorvastatin (LIPITOR) 10 MG tablet Take 1 tablet (10 mg total) by mouth daily.  . clonazePAM (KLONOPIN)  0.5 MG tablet Take 0.5 tablets (0.25 mg total) by mouth 2 (two) times daily. (Patient taking differently: Take 0.25 mg by mouth at bedtime.)  . colchicine 0.6 MG tablet Take 2 tabs po once then 1 tab one hour later for gout flair (Patient taking differently: Take 0.6-1.2 mg by mouth See admin instructions. Take 1.2 mg once then 0.6 mg one hour later for gout flair)  . dabigatran (PRADAXA) 150 MG CAPS capsule TAKE 1 CAPSULE EVERY 12 HOURS (Patient taking differently: Take 150 mg by mouth 2 (two) times daily.)  . furosemide (LASIX) 40 MG tablet Take 80 mg in the morning and 40 mg in the afternoon.  Marland Kitchen LUMIGAN 0.01 % SOLN Place 1 drop into both eyes at bedtime.   . metoprolol succinate (TOPROL-XL) 50 MG 24 hr tablet Take 1 tablet (50 mg total) by mouth daily. Take with or immediately following a meal.  . Multiple Vitamin (MULTIVITAMIN) tablet Take 1 tablet by mouth every evening.  Marland Kitchen PARoxetine (PAXIL) 20 MG tablet Take 0.5 tablets (10 mg total) by mouth daily.  . sacubitril-valsartan (ENTRESTO) 49-51 MG Take 1 tablet by mouth 2 (two) times daily. Start taking on Monday 03/22/20.  . traZODone (DESYREL) 50 MG tablet Take 0.5-1 tablets (25-50 mg total) by mouth at bedtime as needed for sleep. (Patient taking differently: Take 25 mg by mouth at bedtime.)   Current Facility-Administered Medications for the 04/02/20 encounter (Office Visit) with Moyer, Glynna Failla M, MD  Medication  . sodium chloride flush (NS) 0.9 % injection 3 mL     Allergies:   Patient has no known allergies.   Social History   Socioeconomic History  . Marital status: Married    Spouse name: Samuel Moyer  . Number of children: 3  . Years of education: Not on file  . Highest education level: Bachelor's degree (e.g., BA, AB, BS)  Occupational History  . Occupation: Retired    Fish farm manager: RETIRED    Comment: [employer]   Tobacco Use  . Smoking status: Former Smoker    Packs/day: 1.00    Years: 16.00    Pack years: 16.00    Types:  Cigarettes    Quit date: 06/30/1977    Years since quitting: 42.7  . Smokeless tobacco: Never Used  Vaping Use  . Vaping Use: Never used  Substance and Sexual Activity  . Alcohol use: No  . Drug use: No  . Sexual activity: Not on file  Other Topics Concern  . Not on file  Social History Narrative   03/23/20 lives with wife   2 caffeine drinks daily    Social Determinants of Health   Financial Resource Strain: Low Risk   . Difficulty of Paying Living Expenses: Not hard at all  Food Insecurity: No Food Insecurity  . Worried About Charity fundraiser in the Last Year: Never true  . Ran Out of Food in the Last Year: Never true  Transportation Needs: No Transportation Needs  . Lack of Transportation (Medical): No  .  Lack of Transportation (Non-Medical): No  Physical Activity: Inactive  . Days of Exercise per Week: 0 days  . Minutes of Exercise per Session: 0 min  Stress: No Stress Concern Present  . Feeling of Stress : Not at all  Social Connections: Not on file     Family History: The patient's family history includes Colonic polyp in his sister; Heart attack in his father; Heart failure in his father; Hypertension in his father; Stroke in his mother. There is no history of Colon cancer or Stomach cancer.  ROS:   Please see the history of present illness.     EKGs/Labs/Other Studies Reviewed:    The following studies were reviewed today:  Echocardiogram 03/10/2020: Impressions: 1. There is basal-mid inferior and inferolateral left ventricular  hypokinesis. Left ventricular ejection fraction, by estimation, is 45 to  50%. The left ventricle has mildly decreased function. The left ventricle  demonstrates regional wall motion  abnormalities (see scoring diagram/findings for description). Left  ventricular diastolic function could not be evaluated. The average left  ventricular global longitudinal strain is -14.9 %. The global longitudinal  strain is abnormal.  2. Right  ventricular systolic function is moderately reduced. The right  ventricular size is moderately enlarged. There is severely elevated  pulmonary artery systolic pressure.  3. Left atrial size was severely dilated.  4. Right atrial size was severely dilated.  5. The mitral valve is normal in structure. Mild to moderate mitral valve  regurgitation.  6. Tricuspid valve regurgitation is moderate.  7. The aortic valve is tricuspid. Aortic valve regurgitation is mild.  Mild aortic valve sclerosis is present, with no evidence of aortic valve  stenosis.  8. There is borderline dilatation of the ascending aorta, measuring 40  mm.  9. The inferior vena cava is normal in size with <50% respiratory  variability, suggesting right atrial pressure of 8 mmHg.   Comparison(s): A prior study was performed on 01/09/19. Endocardial  definition is better on the current study. Wall motion abnormalities were  probably present on the previous study. Estimated right atrial and  pulmonary artery pressure and right ventricular function all appear to have worsened on the current study.  EKG:  EKG not ordered today.  _______________  Right/Left Cardiac Catheterization 03/18/2020:  Prox LAD to Mid LAD lesion is 20% stenosed.  Prox Cx to Mid Cx lesion is 15% stenosed.  Prox RCA lesion is 30% stenosed.  LV end diastolic pressure is moderately elevated.  Hemodynamic findings consistent with moderate pulmonary hypertension.   1. Mild nonobstructive CAD 2. Moderately elevated LV filling pressures. PCWP and EDP 21 mm Hg.  3. Moderate pulmonary HTN with mean PAP 42 mm Hg 4. Preserved cardiac output. Index 2.4.  Plan: will intensify medical therapy. Increase lasix to 80 mg in the morning and 40 mg in the afternoon. Stop lisinopril. After 3 days will begin Entresto 49/51 mg daily. Will titrate as tolerated. Consider SGLT2 inhibitor. May resume Pradaxa this evening.  Diagnostic Dominance:  Right     Recent Labs: 01/08/2020: ALT 23 02/09/2020: BNP 835.3 03/15/2020: BUN 13; Creatinine, Ser 1.07; Platelets 204 03/18/2020: Hemoglobin 12.2; Potassium 3.5; Sodium 141  Recent Lipid Panel    Component Value Date/Time   CHOL 150 10/20/2019 1038   TRIG 53 10/20/2019 1038   HDL 54 10/20/2019 1038   CHOLHDL 2.8 10/20/2019 1038   VLDL 13.0 10/17/2018 1122   LDLCALC 83 10/20/2019 1038    Physical Exam:    Vital Signs: BP (!) 122/50  Pulse (!) 55   Ht 5\' 8"  (1.727 m)   Wt 202 lb 6.4 oz (91.8 kg)   SpO2 93%   BMI 30.77 kg/m     Wt Readings from Last 3 Encounters:  04/02/20 202 lb 6.4 oz (91.8 kg)  03/24/20 199 lb 12.8 oz (90.6 kg)  03/23/20 203 lb 3.2 oz (92.2 kg)     General: 80 y.o. male in no acute distress. HEENT: Normocephalic and atraumatic. Sclera clear. Neck: Supple. No JVD. Heart: Irregularly irregular rhythm with normal rate. Distinct S1 and S2. No murmurs, gallops, or rubs. Radial pulses 2+ and equal bilaterally. Ecchymosis around right radial cath site but soft.  Lungs: No increased work of breathing. Clear to ausculation bilaterally. No wheezes, rhonchi, or rales.  Abdomen: Soft, non-distended, and non-tender to palpation.  Extremities: 1+ lower extremity edema bilaterally. Skin: Warm and dry. Neuro:  No focal deficits. Psych: Normal affect. Responds appropriately.   Assessment:    1. Acute on chronic combined systolic and diastolic CHF (congestive heart failure) ([place])   2. Nonischemic cardiomyopathy (HCC)   3. Permanent atrial fibrillation ([place])   4. Essential hypertension   5. Hyperlipidemia, unspecified hyperlipidemia type     Plan:    1. Acute on Chronic Combined CHF Non-Ischemic Cardiomyopathy - Patient is symptomatic class2-3. - Echo on 03/10/2020 was ordered and showed LVEF of 45-50% with hypokinesis of basal-mid inferior and inferolateral walls, severe biatrial enlargement, mild to moderate MR, and moderate TR. RV also moderately enlarged  with moderately reduced systolic function and severely elevated PASP. - R/LHC on 03/18/2020 showed mild non-obstructive CAD with moderately elevated LV filling pressures (LVEDP of 6mmHg), moderate pulmonary hypertension, and preserved cardiac output. - Continue Lasix 80mg  in the morning and 40mg  in the evening. - Entresto 49-51mg  twice daily was prescribed after cath; his [employer] has denied coverage for Entresto. We will have our Pharm D to see to see if we can appeal our provide other assistance.would also like them to titrate CHF therapy including potential SGLT 2 inhibitor.  -  -Continue Toprol-XL 50mg  daily.  - Discussed importance of daily weights and sodium/fluid restrictions.  - follow up BMET before he sees pharmacy.  2. Non-Obstructive CAD - Noted on recent cardiac catheterization. - No chest pain. - No aspirin due to need for Pradaxa. - Continue statin.  3. Permanent Atrial Fibrillation - Rates well controlled. - Continue beta-blocker as above.  - Continue Pradaxa.  4. Hypertension - controlled.  - Continue medications for CHF as above. Also on Amlodipine 7.5mg  daily. Ok to continue for now but as we optimize CHF medications this may need to be decreased/stopped.  5. Hyperlipidemia - Lipid panel from 10/2019: Total Cholesterol 150, Triglycerides 53, HDL 54, LDL 83.  - On Lipitor 10mg  daily.  6. Pre-Diabetes - Hemoglobin A1c 6.1.  - Followed by PCP.  Obstructive Sleep Apnea - On BiPAP at night. - Followed by Pulmonology.  Disposition: Follow up 2 months   Medication Adjustments/Labs and Tests Ordered: Current medicines are reviewed at length with the patient today.  Concerns regarding medicines are outlined above.  Orders Placed This Encounter  Procedures  . Basic metabolic panel   No orders of the defined types were placed in this encounter.   Patient Instructions  Medication Instructions:  Continue same medications *If you need a refill on your cardiac  medications before your next appointment, please call your pharmacy*   Lab Work: Bmet    Have done 2 to 3 days before  Pharmacist appointment   Testing/Procedures: None ordered   Follow-Up: At [facility], you and your health needs are our priority.  As part of our continuing mission to provide you with exceptional heart care, we have created designated Provider Care Teams.  These Care Teams include your primary Cardiologist (physician) and Advanced Practice Providers (APPs -  Physician Assistants and Nurse Practitioners) who all work together to provide you with the care you need, when you need it.  We recommend signing up for the patient portal called "MyChart".  Sign up information is provided on this After Visit Summary.  MyChart is used to connect with patients for Virtual Visits (Telemedicine).  Patients are able to view lab/test results, encounter notes, upcoming appointments, etc.  Non-urgent messages can be sent to your provider as well.   To learn more about what you can do with MyChart, go to [email or web address].    Your next appointment:  2 months   The format for your next appointment: Office     Provider:  Dr.Jasani Dolney   Schedule appointment with Pharmacist for titration of Heart Failure medications       Signed, Leota Maka Martinique, MD  04/02/2020 8:57 AM    [place]

## 2020-04-02 ENCOUNTER — Encounter: Payer: Self-pay | Admitting: Cardiology

## 2020-04-02 ENCOUNTER — Ambulatory Visit (INDEPENDENT_AMBULATORY_CARE_PROVIDER_SITE_OTHER): Payer: Medicare Other | Admitting: Cardiology

## 2020-04-02 ENCOUNTER — Other Ambulatory Visit: Payer: Self-pay

## 2020-04-02 VITALS — BP 122/50 | HR 55 | Ht 68.0 in | Wt 202.4 lb

## 2020-04-02 DIAGNOSIS — I2510 Atherosclerotic heart disease of native coronary artery without angina pectoris: Secondary | ICD-10-CM

## 2020-04-02 DIAGNOSIS — I10 Essential (primary) hypertension: Secondary | ICD-10-CM | POA: Diagnosis not present

## 2020-04-02 DIAGNOSIS — E785 Hyperlipidemia, unspecified: Secondary | ICD-10-CM

## 2020-04-02 DIAGNOSIS — I428 Other cardiomyopathies: Secondary | ICD-10-CM | POA: Diagnosis not present

## 2020-04-02 DIAGNOSIS — I4821 Permanent atrial fibrillation: Secondary | ICD-10-CM | POA: Diagnosis not present

## 2020-04-02 DIAGNOSIS — I5043 Acute on chronic combined systolic (congestive) and diastolic (congestive) heart failure: Secondary | ICD-10-CM

## 2020-04-02 NOTE — Patient Instructions (Signed)
Medication Instructions:  Continue same medications *If you need a refill on your cardiac medications before your next appointment, please call your pharmacy*   Lab Work: Bmet    Have done 2 to 3 days before Pharmacist appointment   Testing/Procedures: None ordered   Follow-Up: At [facility], you and your health needs are our priority.  As part of our continuing mission to provide you with exceptional heart care, we have created designated Provider Care Teams.  These Care Teams include your primary Cardiologist (physician) and Advanced Practice Providers (APPs -  Physician Assistants and Nurse Practitioners) who all work together to provide you with the care you need, when you need it.  We recommend signing up for the patient portal called "MyChart".  Sign up information is provided on this After Visit Summary.  MyChart is used to connect with patients for Virtual Visits (Telemedicine).  Patients are able to view lab/test results, encounter notes, upcoming appointments, etc.  Non-urgent messages can be sent to your provider as well.   To learn more about what you can do with MyChart, go to [email or web address].    Your next appointment:  2 months   The format for your next appointment: Office     Provider:  Dr.Jordan   Schedule appointment with Pharmacist for titration of Heart Failure medications

## 2020-04-11 NOTE — Progress Notes (Signed)
Subjective:    Patient ID: Samuel Moyer., male    DOB: 06/21/1940, 80 y.o.   MRN: [id number]  HPI The patient is here for follow up of their chronic medical problems, including htn, hyperlipidemia, chronic combined CHF, permanent Afib, prediabetes, anxiety, gout.  He is here today alone.  He saw neurology - dx with intermittent confusion, visual hallucinations, memory loss.  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    He was found to have an incidental meningioma and saw neurosurgery.    He has no major concerns-just a couple regarding his medications.   Medications and allergies reviewed with patient and updated if appropriate.  Patient Active Problem List   Diagnosis Date Noted  . COVID 02/20/2020  . Visual hallucinations 01/11/2020  . Aortic atherosclerosis ([place]) 10/20/2019  . Nerve disorder 10/08/2019  . Cellulitis of leg, right 08/08/2019  . Physical deconditioning 04/30/2019  . Coughing 04/02/2019  . Dyspnea on exertion 11/01/2018  . Strain of left quadriceps 02/22/2018  . 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 Acute pain of right knee 12/25/2017  . Fall 12/27/2016  . Gout 06/03/2015  . Prediabetes 03/05/2015  . Obesity 12/28/2014  . Venous (peripheral) insufficiency 02/16/2014  . 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 ATRIAL FIBRILLATION  01/26/2010  . Acute on chronic systolic heart failure ([place]) 01/26/2010  . Asthma 01/14/2010  . Hypercholesterolemia 10/21/2007  . Anxiety 10/21/2007  . Essential hypertension 02/26/2006  . COLONIC POLYPS, HX OF 02/26/2006    Current Outpatient Medications on File Prior to Visit  Medication Sig Dispense Refill  . allopurinol (ZYLOPRIM) 300 MG tablet Take 1 tablet (300 mg total) by mouth daily. To lower uric acid for gout (Patient taking differently: Take 300 mg by mouth daily as needed (gout flare).) 90 tablet 1  . amLODipine (NORVASC) 5 MG tablet Take 1.5 tablets (7.5 mg total) by  mouth daily. 135 tablet 3  . atorvastatin (LIPITOR) 10 MG tablet Take 1 tablet (10 mg total) by mouth daily. 90 tablet 3  . clonazePAM (KLONOPIN) 0.5 MG tablet Take 0.5 tablets (0.25 mg total) by mouth 2 (two) times daily. (Patient taking differently: Take 0.25 mg by mouth at bedtime.) 30 tablet 2  . colchicine 0.6 MG tablet Take 2 tabs po once then 1 tab one hour later for gout flair (Patient taking differently: Take 0.6-1.2 mg by mouth See admin instructions. Take 1.2 mg once then 0.6 mg one hour later for gout flair) 30 tablet 1  . dabigatran (PRADAXA) 150 MG CAPS capsule TAKE 1 CAPSULE EVERY 12 HOURS (Patient taking differently: Take 150 mg by mouth 2 (two) times daily.) 180 capsule 3  . furosemide (LASIX) 40 MG tablet Take 80 mg in the morning and 40 mg in the afternoon. 270 tablet 3  . LUMIGAN 0.01 % SOLN Place 1 drop into both eyes at bedtime.     . metoprolol succinate (TOPROL-XL) 50 MG 24 hr tablet Take 1 tablet (50 mg total) by mouth daily. Take with or immediately following a meal. 30 tablet 3  . Multiple Vitamin (MULTIVITAMIN) tablet Take 1 tablet by mouth every evening.    Marland Kitchen PARoxetine (PAXIL) 20 MG tablet Take 0.5 tablets (10 mg total) by mouth daily. 45 tablet 1  . sacubitril-valsartan (ENTRESTO) 49-51 MG Take 1 tablet by mouth 2 (two) times daily. Start taking on Monday 03/22/20. 60 tablet 6  . traZODone (DESYREL) 50 MG tablet Take 0.5-1 tablets (25-50 mg total) by  mouth at bedtime as needed for sleep. (Patient taking differently: Take 25 mg by mouth at bedtime.) 30 tablet 3   No current facility-administered medications on file prior to visit.    Past Medical History:  Diagnosis Date  . A-fib ([place])   . Anxiety   . Atrial fibrillation ([place])   . CHF (congestive heart failure) ([place])   . Claustrophobia    Occasionally when flying   . Colitis   . Diabetes mellitus, type 2 ([place])   . Fracture of one rib, left side, initial encounter for closed fracture 12/27/2016   Occurred  12/21/16 after a fall at home.  Left anterior seventh rib  . Gout   . Hemorrhoids   . Hyperlipidemia   . Hypertension   . LV dysfunction    EF 40-45%  . OSA (obstructive sleep apnea)    CPAP machine   . PVC's (premature ventricular contractions)   . Skin cancer     Past Surgical History:  Procedure Laterality Date  . CARDIOVASCULAR STRESS TEST  03/02/2010   EF 50%  . MOHS SURGERY    . RIGHT/LEFT HEART CATH AND CORONARY ANGIOGRAPHY [name] 03/18/2020   Procedure: RIGHT/LEFT HEART CATH AND CORONARY ANGIOGRAPHY;  Surgeon: Martinique, Peter M, MD;  Location: [facility];  Service: Cardiovascular;  Laterality: [name];  . US ECHOCARDIOGRAPHY  11/15/2009   EF 40-45%    Social History   Socioeconomic History  . Marital status: Married    Spouse name: Sunday Spillers  . Number of children: 3  . Years of education: Not on file  . Highest education level: Bachelor's degree (e.g., BA, AB, BS)  Occupational History  . Occupation: Retired    Fish farm manager: RETIRED    Comment: [employer]   Tobacco Use  . Smoking status: Former Smoker    Packs/day: 1.00    Years: 16.00    Pack years: 16.00    Types: Cigarettes    Quit date: 06/30/1977    Years since quitting: 42.8  . Smokeless tobacco: Never Used  Vaping Use  . Vaping Use: Never used  Substance and Sexual Activity  . Alcohol use: No  . Drug use: No  . Sexual activity: Not on file  Other Topics Concern  . Not on file  Social History Narrative   03/23/20 lives with wife   2 caffeine drinks daily    Social Determinants of Health   Financial Resource Strain: Low Risk   . Difficulty of Paying Living Expenses: Not hard at all  Food Insecurity: No Food Insecurity  . Worried About Charity fundraiser in the Last Year: Never true  . Ran Out of Food in the Last Year: Never true  Transportation Needs: No Transportation Needs  . Lack of Transportation (Medical): No  . Lack of Transportation (Non-Medical): No  Physical Activity: Inactive  . Days  of Exercise per Week: 0 days  . Minutes of Exercise per Session: 0 min  Stress: No Stress Concern Present  . Feeling of Stress : Not at all  Social Connections: Not on file    Family History  Problem Relation Age of Onset  . Hypertension Father   . Heart attack Father        Age 40 (MI)  . Heart failure Father   . Colonic polyp Sister        and Father  . Stroke Mother   . Colon cancer Neg Hx   . Stomach cancer Neg Hx  Review of Systems  Constitutional: Negative for chills and fever.  Respiratory: Positive for cough ( in morning form PND) and shortness of breath (improved - denies with 14 steps). Negative for wheezing.   Cardiovascular: Positive for leg swelling. Negative for chest pain and palpitations.  Neurological: Negative for dizziness, light-headedness and headaches.       Objective:   Vitals:   04/13/20 0916  BP: 140/70  Pulse: 68  Temp: 98.2 F (36.8 C)  SpO2: 94%   BP Readings from Last 3 Encounters:  04/13/20 140/70  04/02/20 (!) 122/50  03/24/20 (!) 147/80   Wt Readings from Last 3 Encounters:  04/13/20 200 lb (90.7 kg)  04/02/20 202 lb 6.4 oz (91.8 kg)  03/24/20 199 lb 12.8 oz (90.6 kg)   Body mass index is 30.41 kg/m.   Physical Exam    Constitutional: Appears well-developed and well-nourished. No distress.  HENT:  Head: Normocephalic and atraumatic.  Neck: Neck supple. No tracheal deviation present. No thyromegaly present.  No cervical lymphadenopathy Cardiovascular: Normal rate, regular rhythm and normal heart sounds.   No murmur heard. No carotid bruit .  Right lower extremity 1+ pitting edema, mild left lower extremity edema.  Bilateral chronic venous stasis-stable Pulmonary/Chest: Effort normal and breath sounds normal. No respiratory distress. No has no wheezes. No rales.  Skin: Skin is warm and dry. Not diaphoretic.  Psychiatric: Normal mood and affect. Behavior is normal.      Assessment & Plan:    See Problem List for  Assessment and Plan of chronic medical problems.    This visit occurred during the SARS-CoV-2 public health emergency.  Safety protocols were in place, including screening questions prior to the visit, additional usage of staff PPE, and extensive cleaning of exam room while observing appropriate contact time as indicated for disinfecting solutions.

## 2020-04-11 NOTE — Patient Instructions (Signed)
    Blood work was ordered.      Medications changes include :   none     Please followup in 6 months  

## 2020-04-13 ENCOUNTER — Encounter: Payer: Self-pay | Admitting: Internal Medicine

## 2020-04-13 ENCOUNTER — Ambulatory Visit (INDEPENDENT_AMBULATORY_CARE_PROVIDER_SITE_OTHER): Payer: Medicare Other | Admitting: Internal Medicine

## 2020-04-13 ENCOUNTER — Other Ambulatory Visit: Payer: Self-pay

## 2020-04-13 VITALS — BP 140/70 | HR 68 | Temp 98.2°F | Ht 68.0 in | Wt 200.0 lb

## 2020-04-13 DIAGNOSIS — E7800 Pure hypercholesterolemia, unspecified: Secondary | ICD-10-CM

## 2020-04-13 DIAGNOSIS — F419 Anxiety disorder, unspecified: Secondary | ICD-10-CM | POA: Diagnosis not present

## 2020-04-13 DIAGNOSIS — M1A0790 Idiopathic chronic gout, unspecified ankle and foot, without tophus (tophi): Secondary | ICD-10-CM | POA: Diagnosis not present

## 2020-04-13 DIAGNOSIS — E66813 Obesity, class 3: Secondary | ICD-10-CM

## 2020-04-13 DIAGNOSIS — R7303 Prediabetes: Secondary | ICD-10-CM

## 2020-04-13 DIAGNOSIS — I2510 Atherosclerotic heart disease of native coronary artery without angina pectoris: Secondary | ICD-10-CM

## 2020-04-13 DIAGNOSIS — I700 Atherosclerosis of aorta: Secondary | ICD-10-CM | POA: Diagnosis not present

## 2020-04-13 DIAGNOSIS — I5022 Chronic systolic (congestive) heart failure: Secondary | ICD-10-CM | POA: Diagnosis not present

## 2020-04-13 DIAGNOSIS — I10 Essential (primary) hypertension: Secondary | ICD-10-CM | POA: Diagnosis not present

## 2020-04-13 LAB — HEMOGLOBIN A1C: Hgb A1c MFr Bld: 6 % (ref 4.6–6.5)

## 2020-04-13 LAB — LIPID PANEL
Cholesterol: 152 mg/dL (ref 0–200)
HDL: 58.9 mg/dL (ref >39.00)
LDL Cholesterol: 82 mg/dL (ref 0–99)
NonHDL: 93.49
Total CHOL/HDL Ratio: 3
Triglycerides: 56 mg/dL (ref 0.0–149.0)
VLDL: 11.2 mg/dL (ref 0.0–40.0)

## 2020-04-13 LAB — COMPREHENSIVE METABOLIC PANEL
ALT: 12 U/L (ref 0–53)
AST: 19 U/L (ref 0–37)
Albumin: 4 g/dL (ref 3.5–5.2)
Alkaline Phosphatase: 80 U/L (ref 39–117)
BUN: 19 mg/dL (ref 6–23)
CO2: 30 mEq/L (ref 19–32)
Calcium: 9.5 mg/dL (ref 8.4–10.5)
Chloride: 102 mEq/L (ref 96–112)
Creatinine, Ser: 1.11 mg/dL (ref 0.40–1.50)
GFR: 62.66 mL/min (ref >60.00)
Glucose, Bld: 83 mg/dL (ref 70–99)
Potassium: 3.5 mEq/L (ref 3.5–5.1)
Sodium: 142 mEq/L (ref 135–145)
Total Bilirubin: 1.1 mg/dL (ref 0.2–1.2)
Total Protein: 7.2 g/dL (ref 6.0–8.3)

## 2020-04-13 LAB — URIC ACID: Uric Acid, Serum: 5.9 mg/dL (ref 4.0–7.8)

## 2020-04-13 NOTE — Assessment & Plan Note (Signed)
Chronic Continue atorvastatin 10 mg daily Check lipids today-LDL goal less than 70 Encourage as much activity as tolerated, healthy diet

## 2020-04-13 NOTE — Assessment & Plan Note (Signed)
Chronic Check lipid panel  Continue atorvastatin 10 mg daily-May need to increase this depending on LDL Regular exercise and healthy diet encouraged

## 2020-04-13 NOTE — Assessment & Plan Note (Signed)
Chronic Blood pressure okay here today-we will not make any changes because cardiology is working on starting him on Entresto Continue amlodipine 7.5 mg daily, Lasix 80 mg in a.m. and 40 mg in afternoon, metoprolol XL 50 mg daily CMP

## 2020-04-13 NOTE — Assessment & Plan Note (Signed)
Chronic Check a1c Low sugar / carb diet Stressed regular exercise  

## 2020-04-13 NOTE — Assessment & Plan Note (Addendum)
Chronic No gout symptoms since he was here last Continue allopurinol 300 mg daily Colchicine 1.2mg  x 1, then 0.6mg  1 hour later for gout flare Check uric acid level

## 2020-04-13 NOTE — Assessment & Plan Note (Signed)
Chronic Seems to be euvolemic here today Shortness of breath improved Following with cardiology Continue Lasix 80 mg in morning, 40 mg in afternoon CMP  Cardiology is working on getting him on [place] and may consider starting Farxiga/Jardiance at some point in the future

## 2020-04-13 NOTE — Assessment & Plan Note (Signed)
Chronic Not currently exercising regularly-encouraged increased activity Healthy diet encouraged

## 2020-04-13 NOTE — Assessment & Plan Note (Signed)
Chronic Controlled, stable Continue Paxil 10 mg daily

## 2020-04-16 ENCOUNTER — Other Ambulatory Visit: Payer: Self-pay | Admitting: Internal Medicine

## 2020-04-16 MED ORDER — ATORVASTATIN CALCIUM 20 MG PO TABS: 20.0000 mg | ORAL_TABLET | Freq: Every day | ORAL | 3 refills | Status: AC

## 2020-04-16 NOTE — Progress Notes (Signed)
Will inc statin to 20 mg

## 2020-04-23 DIAGNOSIS — H353131 Nonexudative age-related macular degeneration, bilateral, early dry stage: Secondary | ICD-10-CM | POA: Diagnosis not present

## 2020-04-23 DIAGNOSIS — H401131 Primary open-angle glaucoma, bilateral, mild stage: Secondary | ICD-10-CM | POA: Diagnosis not present

## 2020-04-27 DIAGNOSIS — I10 Essential (primary) hypertension: Secondary | ICD-10-CM | POA: Diagnosis not present

## 2020-04-27 DIAGNOSIS — E785 Hyperlipidemia, unspecified: Secondary | ICD-10-CM | POA: Diagnosis not present

## 2020-04-27 DIAGNOSIS — I5043 Acute on chronic combined systolic (congestive) and diastolic (congestive) heart failure: Secondary | ICD-10-CM | POA: Diagnosis not present

## 2020-04-27 DIAGNOSIS — I428 Other cardiomyopathies: Secondary | ICD-10-CM | POA: Diagnosis not present

## 2020-04-27 DIAGNOSIS — I4821 Permanent atrial fibrillation: Secondary | ICD-10-CM | POA: Diagnosis not present

## 2020-04-27 LAB — BASIC METABOLIC PANEL
BUN/Creatinine Ratio: 15 (ref 10–24)
BUN: 17 mg/dL (ref 8–27)
CO2: 25 mmol/L (ref 20–29)
Calcium: 9.2 mg/dL (ref 8.6–10.2)
Chloride: 103 mmol/L (ref 96–106)
Creatinine, Ser: 1.17 mg/dL (ref 0.76–1.27)
Glucose: 102 mg/dL — ABNORMAL HIGH (ref 65–99)
Potassium: 3.5 mmol/L (ref 3.5–5.2)
Sodium: 143 mmol/L (ref 134–144)
eGFR: 63 mL/min/{1.73_m2} (ref >59)

## 2020-04-29 ENCOUNTER — Ambulatory Visit (INDEPENDENT_AMBULATORY_CARE_PROVIDER_SITE_OTHER): Payer: Medicare Other | Admitting: Pharmacist

## 2020-04-29 ENCOUNTER — Other Ambulatory Visit: Payer: Self-pay

## 2020-04-29 VITALS — BP 126/68 | HR 100 | Resp 15 | Ht 68.0 in | Wt 203.0 lb

## 2020-04-29 DIAGNOSIS — I2510 Atherosclerotic heart disease of native coronary artery without angina pectoris: Secondary | ICD-10-CM | POA: Diagnosis not present

## 2020-04-29 DIAGNOSIS — I5022 Chronic systolic (congestive) heart failure: Secondary | ICD-10-CM | POA: Diagnosis not present

## 2020-04-29 MED ORDER — EMPAGLIFLOZIN 10 MG PO TABS: 10.0000 mg | ORAL_TABLET | Freq: Every day | ORAL | 0 refills | Status: DC

## 2020-04-29 MED ORDER — AMLODIPINE BESYLATE 5 MG PO TABS
2.5000 mg | ORAL_TABLET | Freq: Every day | ORAL | 1 refills | Status: AC
Start: 2020-04-29 — End: ?

## 2020-04-29 NOTE — Progress Notes (Signed)
Patient ID: Samuel Moyer.                 DOB: Jul 01, 1940                      MRN: [id number]     HPI: Samuel Moyer. is a 80 y.o. male referred by Dr. Martinique to pharmD clinic for HF ,medication titrations. PMH includes non-obstructive CAD, HF with EF 45% on recent ECHO (EF 40-45% on 11/15/2009), atrial fibrillation, OSA, hypertension, hyperlipidemia, pre-diabetes, and hallucinations followed by neurologist.   Guideline recommended therapy is missing spironolactone/eplerenone and SGLT2i. Noted patient also has issues getting Entresto covered by insurance.   Current HTN meds:  Entresto 49-51mg  twice daily Metoprolol succinate 50mg  daily Furosemide 80mg  in AM and 40mg  in PM Amlodipine 7.5mg  daily  BP goal: <130/80  Family History: includes Colonic polyp in his sister; Heart attack in his father; Heart failure in his father; Hypertension in his father; Stroke in his mother. There is no history of Colon cancer or Stomach cancer.  Social History: former smoker, denies alcohol, drink 2 caffeinated drinks per day  Exercise: activities of daily living  Home BP readings: none provided  Wt Readings from Last 3 Encounters:  04/29/20 203 lb (92.1 kg)  04/13/20 200 lb (90.7 kg)  04/02/20 202 lb 6.4 oz (91.8 kg)   BP Readings from Last 3 Encounters:  04/29/20 126/68  04/13/20 140/70  04/02/20 (!) 122/50   Pulse Readings from Last 3 Encounters:  04/29/20 100  04/13/20 68  04/02/20 (!) 55    Past Medical History:  Diagnosis Date  . A-fib ([place])   . Anxiety   . Atrial fibrillation ([place])   . CHF (congestive heart failure) ([place])   . Claustrophobia    Occasionally when flying   . Colitis   . Diabetes mellitus, type 2 ([place])   . Fracture of one rib, left side, initial encounter for closed fracture 12/27/2016   Occurred 12/21/16 after a fall at home.  Left anterior seventh rib  . Gout   . Hemorrhoids   . Hyperlipidemia   . Hypertension   . LV dysfunction    EF 40-45%  . OSA  (obstructive sleep apnea)    CPAP machine   . PVC's (premature ventricular contractions)   . Skin cancer     Current Outpatient Medications on File Prior to Visit  Medication Sig Dispense Refill  . allopurinol (ZYLOPRIM) 300 MG tablet Take 1 tablet (300 mg total) by mouth daily. To lower uric acid for gout (Patient taking differently: Take 300 mg by mouth daily as needed (gout flare).) 90 tablet 1  . atorvastatin (LIPITOR) 20 MG tablet Take 1 tablet (20 mg total) by mouth daily. 90 tablet 3  . clonazePAM (KLONOPIN) 0.5 MG tablet Take 0.5 tablets (0.25 mg total) by mouth 2 (two) times daily. (Patient taking differently: Take 0.25 mg by mouth at bedtime.) 30 tablet 2  . colchicine 0.6 MG tablet Take 2 tabs po once then 1 tab one hour later for gout flair (Patient taking differently: Take 0.6-1.2 mg by mouth See admin instructions. Take 1.2 mg once then 0.6 mg one hour later for gout flair) 30 tablet 1  . dabigatran (PRADAXA) 150 MG CAPS capsule TAKE 1 CAPSULE EVERY 12 HOURS (Patient taking differently: Take 150 mg by mouth 2 (two) times daily.) 180 capsule 3  . furosemide (LASIX) 40 MG tablet Take 80 mg in the morning and 40 mg in  the afternoon. 270 tablet 3  . LUMIGAN 0.01 % SOLN Place 1 drop into both eyes at bedtime.     . metoprolol succinate (TOPROL-XL) 50 MG 24 hr tablet Take 1 tablet (50 mg total) by mouth daily. Take with or immediately following a meal. 30 tablet 3  . Multiple Vitamin (MULTIVITAMIN) tablet Take 1 tablet by mouth every evening.    Marland Kitchen PARoxetine (PAXIL) 20 MG tablet Take 0.5 tablets (10 mg total) by mouth daily. 45 tablet 1  . sacubitril-valsartan (ENTRESTO) 49-51 MG Take 1 tablet by mouth 2 (two) times daily. Start taking on Monday 03/22/20. 60 tablet 6  . traZODone (DESYREL) 50 MG tablet Take 0.5-1 tablets (25-50 mg total) by mouth at bedtime as needed for sleep. (Patient taking differently: Take 25 mg by mouth at bedtime.) 30 tablet 3   No current facility-administered  medications on file prior to visit.    No Known Allergies  Blood pressure 126/68, pulse 100, resp. rate 15, height 5\' 8"  (1.727 m), weight 203 lb (92.1 kg), SpO2 95 %.  Chronic systolic heart failure ([place]) Tricare insurance pays for [facility] without need for prior authorization, but patient received letter indicating need for EF 35 or less to qualify for approval. Due to change in Heart Failure guidelines, and recommendation for Entresto up to EF of 49%, I will appeal Tricare decision. Two weeks Entresto 49/51mg  provided today. Patient already used 30 day free card. We will follow up and determine if possible to get medication cover ASAP.  Will add Jardiance 10mg  daily yo his current regimen as well and follow up in 2 weeks to repeat BMET and continue to tolerate HF medication. Plan to decrease furosemide to 40mg  twice daily if patient able to tolerate Jardiance 10mg  daily.   Bryanah Sidell Rodriguez-Guzman PharmD, BCPS, [place] [address] 05/04/2020 2:32 PM

## 2020-04-29 NOTE — Patient Instructions (Addendum)
Return for a  follow up appointment in 4 weeks  Check your blood pressure at home daily (if able) and keep record of the readings.  Take your BP meds as follows: *DECREASE AMLODIPINE DOSE TO 2.5MG  DAILY* *CONTINUE TAKING ENTRESTO 49/51MG  TWICE DAILY* *START TAKING JARDIANCE 10MG  DAILY*  Bring all of your meds, your BP cuff and your record of home blood pressures to your next appointment.  Exercise as you're able, try to walk approximately 30 minutes per day.  Keep salt intake to a minimum, especially watch canned and prepared boxed foods.  Eat more fresh fruits and vegetables and fewer canned items.  Avoid eating in fast food restaurants.    HOW TO TAKE YOUR BLOOD PRESSURE: . Rest 5 minutes before taking your blood pressure. .  Don't smoke or drink caffeinated beverages for at least 30 minutes before. . Take your blood pressure before (not after) you eat. . Sit comfortably with your back supported and both feet on the floor (don't cross your legs). . Elevate your arm to heart level on a table or a desk. . Use the proper sized cuff. It should fit smoothly and snugly around your bare upper arm. There should be enough room to slip a fingertip under the cuff. The bottom edge of the cuff should be 1 inch above the crease of the elbow. . Ideally, take 3 measurements at one sitting and record the average.

## 2020-05-04 ENCOUNTER — Encounter: Payer: Self-pay | Admitting: Pharmacist

## 2020-05-04 NOTE — Assessment & Plan Note (Signed)
T[employer] insurance pays for [employer] without need for prior authorization, but patient received letter indicating need for EF 35 or less to qualify for approval. Due to change in Heart Failure guidelines, and recommendation for Entresto up to EF of 49%, I will appeal Tricare decision. Two weeks Entresto 49/51mg  provided today. Patient already used 30 day free card. We will follow up and determine if possible to get medication cover ASAP.  Will add Jardiance 10mg  daily yo his current regimen as well and follow up in 2 weeks to repeat BMET and continue to tolerate HF medication. Plan to decrease furosemide to 40mg  twice daily if patient able to tolerate Jardiance 10mg  daily.

## 2020-05-11 IMAGING — DX DG CHEST 2V
2 series · 2 of 2 positions shown · non-contrast
Comparison: Chest radiograph dated 10/25/2018.

CLINICAL DATA: 78-year-old male with shortness of breath.

EXAM:
CHEST - 2 VIEW

[chest pa]
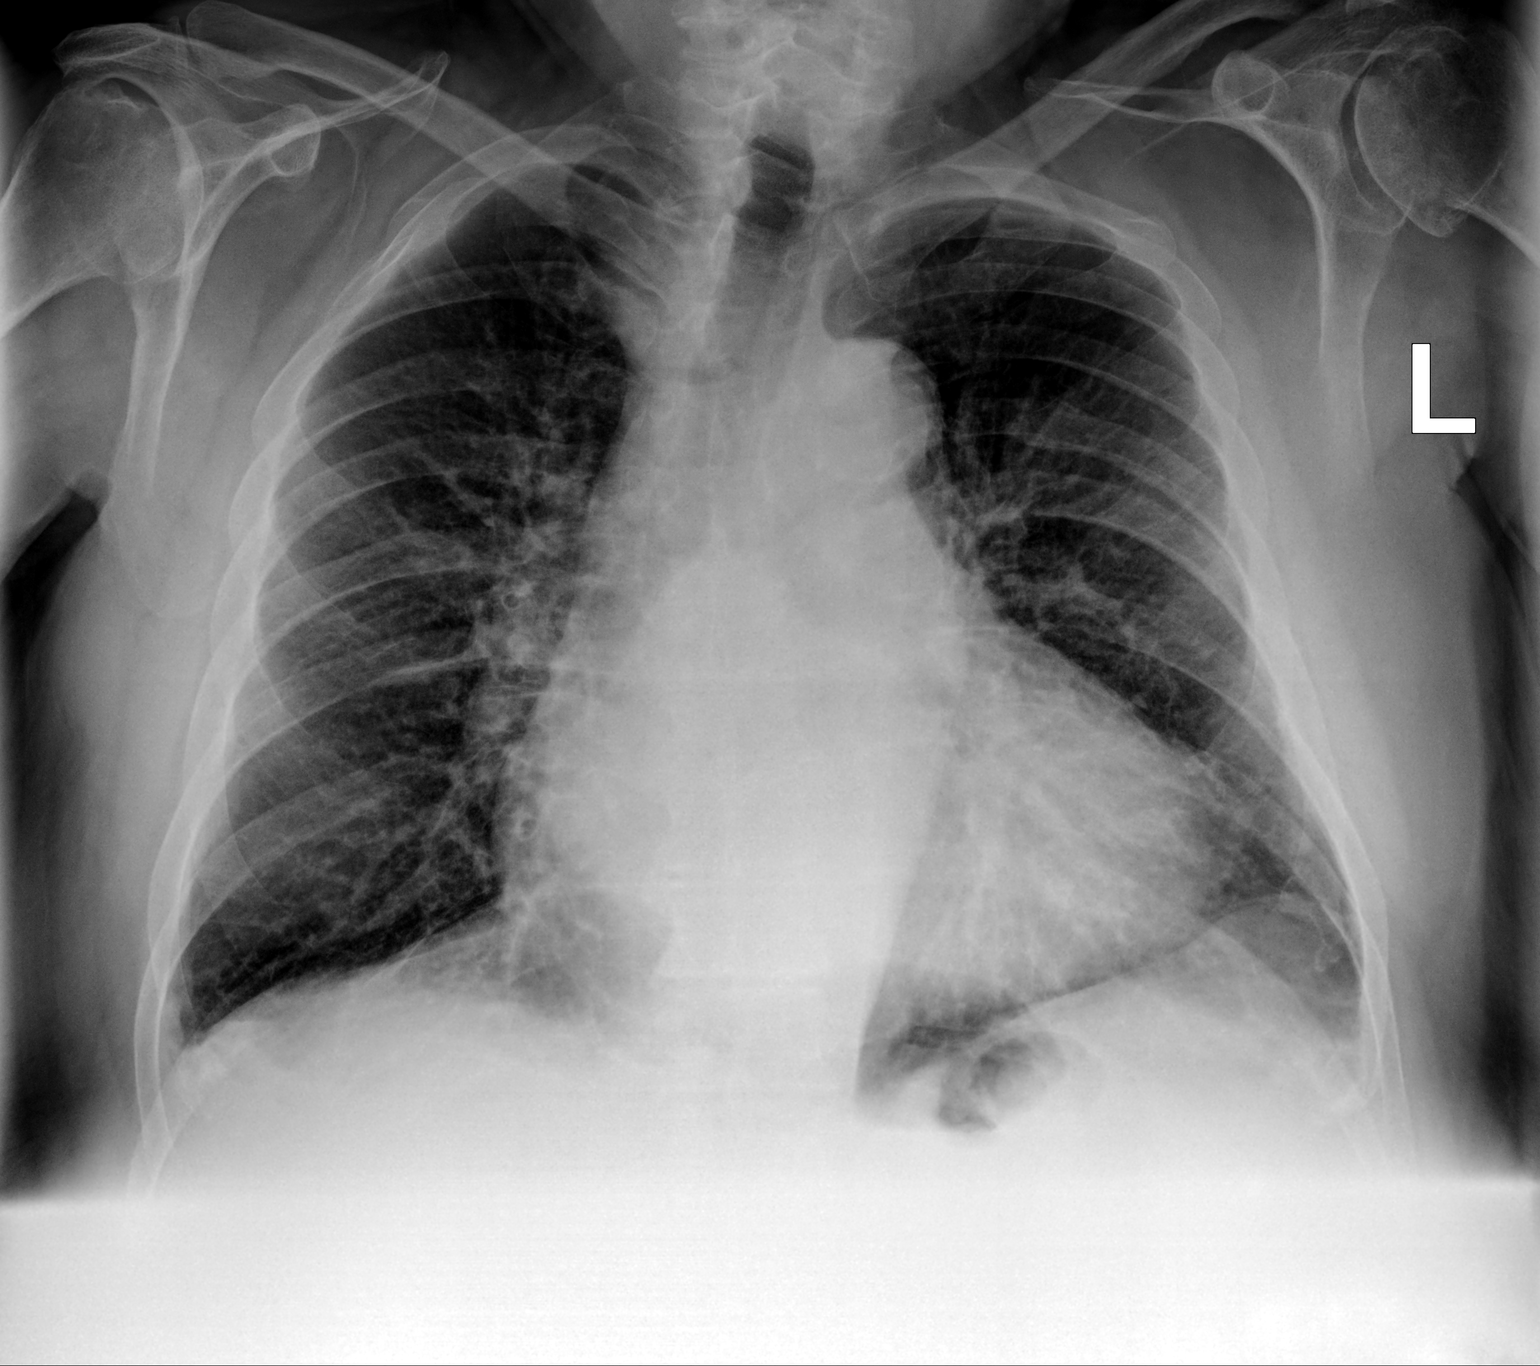

[chest lat]
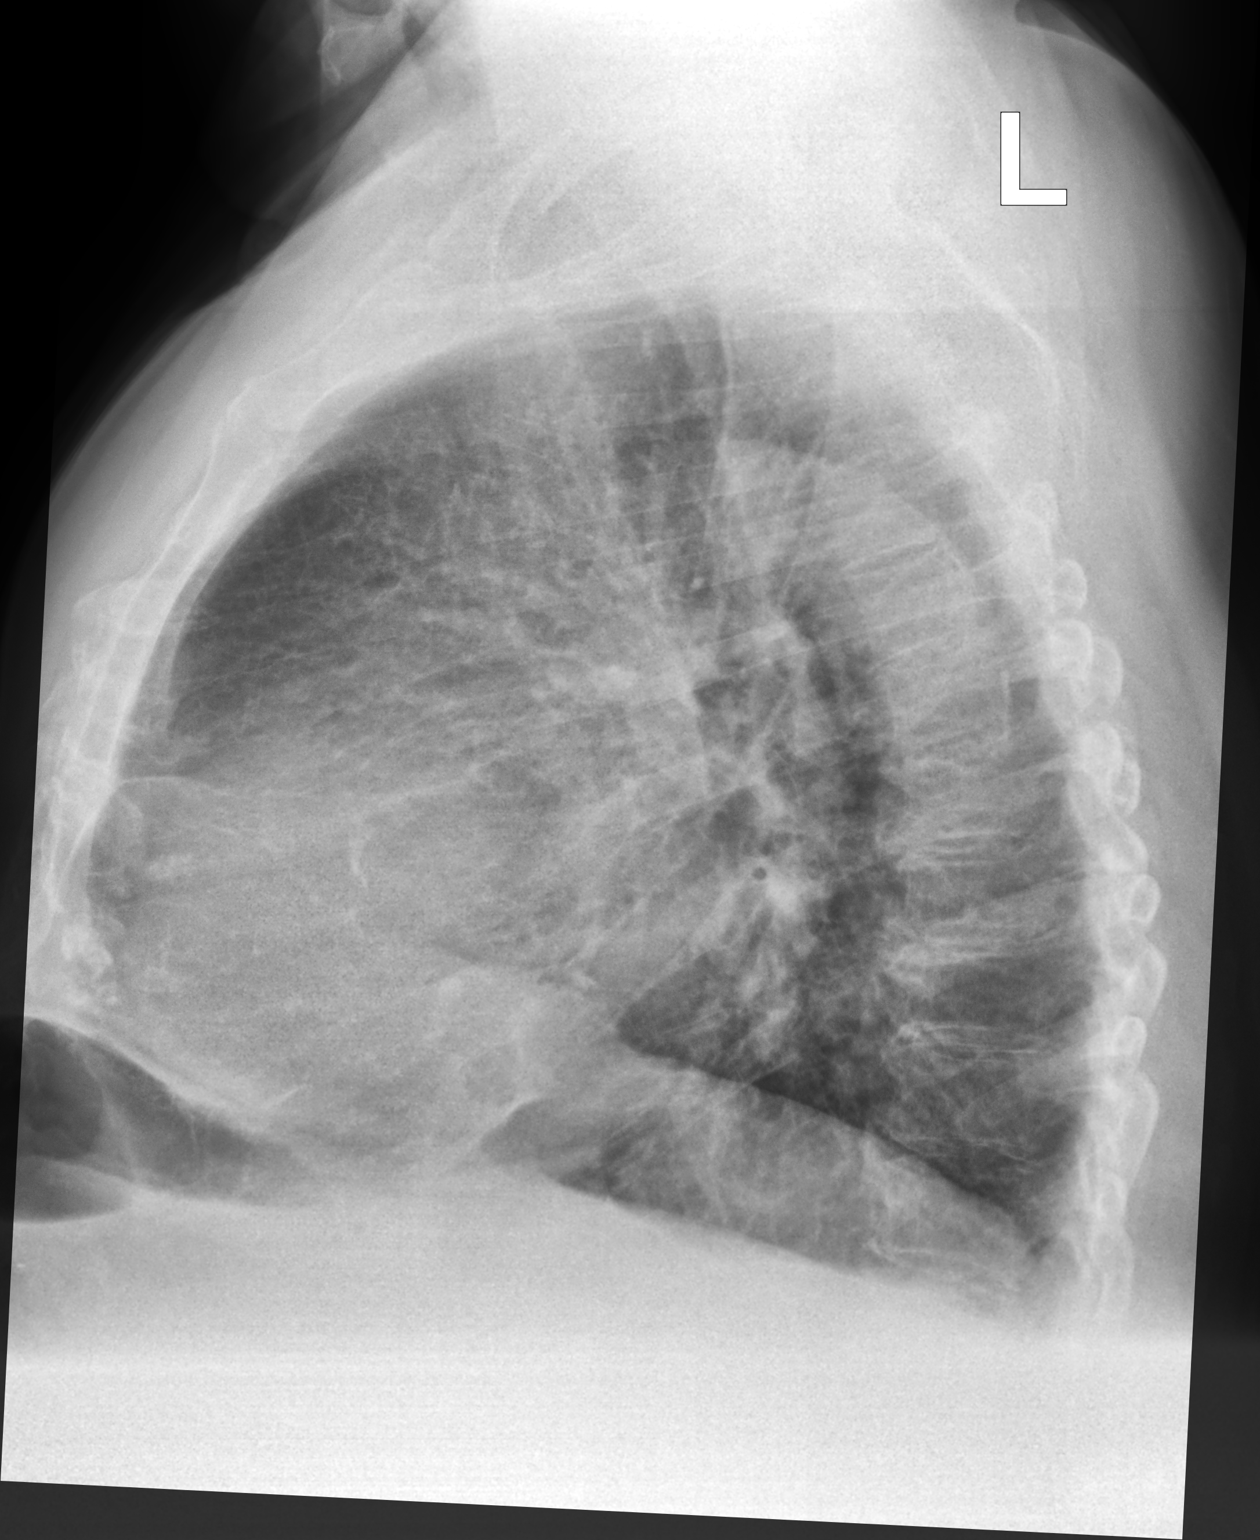

[2 of 2 positions shown; findings below may reference images not displayed]

FINDINGS: There is diffuse chronic interstitial coarsening and bronchitic
changes. No focal consolidation, pleural effusion, or pneumothorax.
Mild cardiomegaly. No acute osseous pathology.
IMPRESSION: No acute cardiopulmonary process.

## 2020-05-13 ENCOUNTER — Other Ambulatory Visit: Payer: Self-pay | Admitting: Internal Medicine

## 2020-05-14 ENCOUNTER — Other Ambulatory Visit: Payer: Self-pay

## 2020-05-14 MED ORDER — ENTRESTO 49-51 MG PO TABS: 1.0000 | ORAL_TABLET | Freq: Two times a day (BID) | ORAL | 6 refills | Status: DC

## 2020-05-14 MED ORDER — EMPAGLIFLOZIN 10 MG PO TABS: 10.0000 mg | ORAL_TABLET | Freq: Every day | ORAL | 1 refills | Status: DC

## 2020-05-27 ENCOUNTER — Ambulatory Visit (INDEPENDENT_AMBULATORY_CARE_PROVIDER_SITE_OTHER): Payer: Medicare Other | Admitting: Pharmacist Clinician (PhC)/ Clinical Pharmacy Specialist

## 2020-05-27 ENCOUNTER — Other Ambulatory Visit: Payer: Self-pay

## 2020-05-27 DIAGNOSIS — I5023 Acute on chronic systolic (congestive) heart failure: Secondary | ICD-10-CM

## 2020-05-27 DIAGNOSIS — I2510 Atherosclerotic heart disease of native coronary artery without angina pectoris: Secondary | ICD-10-CM

## 2020-05-27 NOTE — Assessment & Plan Note (Signed)
Patient with HF doing well on current medications.   Will have him reduce furosemide from 120 mg daily to 80 mg daily (40 mg bid).  He is to monitor for any signs of increased swelling and can go back to the 80 mg am furosemide if needed.  Also clarified his metoprolol, suggesting he use the 25 mg bid until it is gone, then only use 50 mg qd dose in the future.  Patient is not taking MRA at this time.  In the future we could decrease dose of amlodipine to 5 or even 2.5 mg and add spironolactone 12.5-25 mg.  Hopefully at his next visit he will have taken his medication and we can get a more accurate BP picture.  He is scheduled to see Dr. Martinique next month and we can do further medication adjustments after that if needed.

## 2020-05-27 NOTE — Patient Instructions (Signed)
  Check your blood pressure at home daily (if able) and keep record of the readings.  Take your BP meds as follows:  Metoprolol:   Take the tartrate 25 mg twice daily until they are gone, then switch to   Succinate 50 mg once daily  Furosemide - cut dose back to 1 tablet (40 mg) twice daily.  If you notice the swelling in your ankles/legs increase, then go back to 2 tablets in the mornings and 1 tablet at night.  Continue with all other medications   Bring all of your meds, your BP cuff and your record of home blood pressures to your next appointment.  Exercise as you're able, try to walk approximately 30 minutes per day.  Keep salt intake to a minimum, especially watch canned and prepared boxed foods.  Eat more fresh fruits and vegetables and fewer canned items.  Avoid eating in fast food restaurants.    HOW TO TAKE YOUR BLOOD PRESSURE: . Rest 5 minutes before taking your blood pressure. .  Don't smoke or drink caffeinated beverages for at least 30 minutes before. . Take your blood pressure before (not after) you eat. . Sit comfortably with your back supported and both feet on the floor (don't cross your legs). . Elevate your arm to heart level on a table or a desk. . Use the proper sized cuff. It should fit smoothly and snugly around your bare upper arm. There should be enough room to slip a fingertip under the cuff. The bottom edge of the cuff should be 1 inch above the crease of the elbow. . Ideally, take 3 measurements at one sitting and record the average.

## 2020-05-27 NOTE — Progress Notes (Signed)
Patient ID: Samuel Moyer.                 DOB: 1940-09-02                      MRN: [id number]     HPI: Samuel Moyer. is a 80 y.o. male referred by Dr. Martinique to pharmD clinic for HF ,medication titrations. PMH includes non-obstructive CAD, HF with EF 45% on recent ECHO (EF 40-45% on 11/15/2009), atrial fibrillation, OSA, hypertension, hyperlipidemia, pre-diabetes, and hallucinations followed by neurologist.  He was seen last month in CV[facility] at which time he was started on Jardiance 10 mg daily.  Entresto required prior authorization as his EF was not < 40%, and this was obtained.    [name] returns to the office today.  In looking at [place] he is still not taking spironolactone/eperenone.  His BP is slightly elevated today, however he has not taken any of his medications yet this morning.  Admits that he doesn't usually eat until around noon, and takes all of his "morning" medications about this time.   He reports no issues with the addition of Jardiance last month.  He did admit to some confusion about his metoprolol.  Has a bottle of metoprolol tartrate 25 mg bid as well as metoprolol succ 50 mg qd.  He isn't sure which he should be taking.    Current HTN meds:  Entresto 49-51mg  twice daily Metoprolol succinate 50mg  daily Furosemide 80mg  in AM and 40mg  in PM Amlodipine 7.5mg  daily  BP goal: <130/80  Family History: includes Colonic polyp in his sister; Heart attack in his father; Heart failure in his father; Hypertension in his father; Stroke in his mother. There is no history of Colon cancer or Stomach cancer.  Social History: former smoker, denies alcohol, drink 2 caffeinated drinks per day  Exercise: activities of daily living  Home BP readings: none provided  Wt Readings from Last 3 Encounters:  05/27/20 199 lb (90.3 kg)  04/29/20 203 lb (92.1 kg)  04/13/20 200 lb (90.7 kg)   BP Readings from Last 3 Encounters:  05/27/20 (!) 142/76  04/29/20 126/68  04/13/20 140/70   Pulse  Readings from Last 3 Encounters:  05/27/20 95  04/29/20 100  04/13/20 68    Past Medical History:  Diagnosis Date  . A-fib ([place])   . Anxiety   . Atrial fibrillation ([place])   . CHF (congestive heart failure) ([place])   . Claustrophobia    Occasionally when flying   . Colitis   . Diabetes mellitus, type 2 ([place])   . Fracture of one rib, left side, initial encounter for closed fracture 12/27/2016   Occurred 12/21/16 after a fall at home.  Left anterior seventh rib  . Gout   . Hemorrhoids   . Hyperlipidemia   . Hypertension   . LV dysfunction    EF 40-45%  . OSA (obstructive sleep apnea)    CPAP machine   . PVC's (premature ventricular contractions)   . Skin cancer     Current Outpatient Medications on File Prior to Visit  Medication Sig Dispense Refill  . allopurinol (ZYLOPRIM) 300 MG tablet TAKE 1 TABLET BY MOUTH DAILY TO LOWER URIC ACID FOR GOUT 90 tablet 1  . amLODipine (NORVASC) 5 MG tablet Take 0.5 tablets (2.5 mg total) by mouth daily. 45 tablet 1  . atorvastatin (LIPITOR) 20 MG tablet Take 1 tablet (20 mg total) by mouth daily. 90 tablet  3  . clonazePAM (KLONOPIN) 0.5 MG tablet Take 0.5 tablets (0.25 mg total) by mouth 2 (two) times daily. (Patient taking differently: Take 0.25 mg by mouth at bedtime.) 30 tablet 2  . colchicine 0.6 MG tablet Take 2 tabs po once then 1 tab one hour later for gout flair (Patient taking differently: Take 0.6-1.2 mg by mouth See admin instructions. Take 1.2 mg once then 0.6 mg one hour later for gout flair) 30 tablet 1  . dabigatran (PRADAXA) 150 MG CAPS capsule TAKE 1 CAPSULE EVERY 12 HOURS (Patient taking differently: Take 150 mg by mouth 2 (two) times daily.) 180 capsule 3  . empagliflozin (JARDIANCE) 10 MG TABS tablet Take 1 tablet (10 mg total) by mouth daily before breakfast. 90 tablet 1  . furosemide (LASIX) 40 MG tablet Take 80 mg in the morning and 40 mg in the afternoon. 270 tablet 3  . LUMIGAN 0.01 % SOLN Place 1 drop into both eyes at  bedtime.     . Multiple Vitamin (MULTIVITAMIN) tablet Take 1 tablet by mouth every evening.    Marland Kitchen PARoxetine (PAXIL) 20 MG tablet Take 0.5 tablets (10 mg total) by mouth daily. 45 tablet 1  . sacubitril-valsartan (ENTRESTO) 49-51 MG Take 1 tablet by mouth 2 (two) times daily. Start taking on Monday 03/22/20. 60 tablet 6  . traZODone (DESYREL) 50 MG tablet Take 0.5-1 tablets (25-50 mg total) by mouth at bedtime as needed for sleep. (Patient taking differently: Take 25 mg by mouth at bedtime.) 30 tablet 3  . metoprolol succinate (TOPROL-XL) 50 MG 24 hr tablet Take 1 tablet (50 mg total) by mouth daily. Take with or immediately following a meal. (Patient not taking: Reported on 05/27/2020) 30 tablet 3   No current facility-administered medications on file prior to visit.    No Known Allergies  Blood pressure (!) 142/76, pulse 95, resp. rate 14, height 5\' 8"  (1.727 m), weight 199 lb (90.3 kg), SpO2 96 %.  Acute on chronic systolic heart failure [facility]) Patient with HF doing well on current medications.   Will have him reduce furosemide from 120 mg daily to 80 mg daily (40 mg bid).  He is to monitor for any signs of increased swelling and can go back to the 80 mg am furosemide if needed.  Also clarified his metoprolol, suggesting he use the 25 mg bid until it is gone, then only use 50 mg qd dose in the future.  Patient is not taking MRA at this time.  In the future we could decrease dose of amlodipine to 5 or even 2.5 mg and add spironolactone 12.5-25 mg.  Hopefully at his next visit he will have taken his medication and we can get a more accurate BP picture.  He is scheduled to see Dr. Martinique next month and we can do further medication adjustments after that if needed.    Tommy Medal PharmD CPP [facility] [address] [place] [address] 05/27/2020 3:53 PM

## 2020-06-07 ENCOUNTER — Other Ambulatory Visit: Payer: Self-pay | Admitting: Internal Medicine

## 2020-06-20 NOTE — Progress Notes (Signed)
Cardiology Office Note:    Date:  06/25/2020   ID:  [name]., DOB [birth year], MRN [id number]  PCP:  Binnie Rail, MD  Cardiologist:  Darling Cieslewicz Martinique, MD  Electrophysiologist:  None   Referring MD: Binnie Rail, MD   Chief Complaint: follow-up of CHF  History of Present Illness:    Haldon Carley. is a 80 y.o. 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 Martinique and presents today for follow-up of CHF.  Patient was seen by Dr. Martinique on 02/05/2018 at which time her reported dyspnea on exertion (such as walking up a hill or carrying something) and weight gain.He did admit to eating out a lot and eating a lot of processed meats. Lasix was increased to 40mg  twice daily. BNP was ordered and came back elevated at 835. Echo was ordered and showed LVEF of 45-50% with hypokinesis of basal-mid inferior and inferolateral walls, severe biatrial enlargement, mild to moderate MR, and moderate TR. RV also moderately enlarged with moderately reduced systolic function and severely elevated PASP. He was seen by Dr. Martinique again on 03/15/2020 for follow-up at which time he reported increased urine output with increase in Lasix but weight and shortness of breath were unchanged. Right/left cardiac catheterization was recommended for further evaluation. This was performed on 03/18/2020 and showed mild non-obstructive CAD with moderately elevated LV filling pressures (LVEDP of 40mmHg), moderate pulmonary hypertension, and preserved cardiac output. Lasix was increased to 80mg  in the morning and 40mg  in the afternoon. Lisinopril was stopped with plan to start Entresto 49/51 twice daily after 3  day washout period.   Patient called our office on 03/22/2020 with reports of worsening shortness of breath since cardiac catheterization as well as dizziness. He stated he never started the [facility] because it was too expensive. This visit was scheduled or further evaluation. He was given a one month supply. Has been taking Entresto since then at 49/51 mg bid dose. Followed by Pharm D. Lasix reduced to 40 mg bid. On Jardiance. Toprol XL at 50 mg daily.   On follow up today he is doing well. He is active working at [employer]. States he is able to go up 29 steps without giving out. Notes he gets winded but it is much better than before. No change in edema. Weight is stable. No chest pain or dizziness. He is tolerating medications well.    Past Medical History:  Diagnosis Date  . A-fib ([place])   . Anxiety   . Atrial fibrillation ([place])   . CHF (congestive heart failure) ([place])   . Claustrophobia    Occasionally when flying   . Colitis   . Diabetes mellitus, type 2 ([place])   . Fracture of one rib, left side, initial encounter for closed fracture 12/27/2016   Occurred 12/21/16 after a fall at home.  Left anterior seventh rib  . Gout   . Hemorrhoids   . Hyperlipidemia   . Hypertension   . LV dysfunction    EF 40-45%  . OSA (obstructive sleep apnea)    CPAP machine   . PVC's (premature ventricular contractions)   . Skin cancer     Past Surgical History:  Procedure Laterality Date  . CARDIOVASCULAR STRESS TEST  03/02/2010   EF 50%  . MOHS SURGERY    . RIGHT/LEFT HEART CATH AND CORONARY ANGIOGRAPHY [name] 03/18/2020   Procedure: RIGHT/LEFT HEART CATH AND CORONARY ANGIOGRAPHY;  Surgeon: Martinique, Oumou Smead M, MD;  Location: [place] CV [facility];  Service: Cardiovascular;  Laterality: [name];  . US ECHOCARDIOGRAPHY  11/15/2009   EF 40-45%    Current Medications: Current Meds  Medication Sig  . allopurinol (ZYLOPRIM) 300 MG tablet TAKE 1 TABLET BY MOUTH DAILY TO LOWER URIC ACID FOR GOUT  . amLODipine  (NORVASC) 5 MG tablet Take 0.5 tablets (2.5 mg total) by mouth daily.  Marland Kitchen atorvastatin (LIPITOR) 20 MG tablet Take 1 tablet (20 mg total) by mouth daily.  . clonazePAM (KLONOPIN) 0.5 MG tablet Take 0.5-1 tablets (0.25-0.5 mg total) by mouth 2 (two) times daily as needed for anxiety.  . colchicine 0.6 MG tablet TAKE 1 TABLET BY MOUTH DAILY, TO PREVENT GOUT FLARE  . dabigatran (PRADAXA) 150 MG CAPS capsule TAKE 1 CAPSULE EVERY 12 HOURS (Patient taking differently: Take 150 mg by mouth 2 (two) times daily.)  . empagliflozin (JARDIANCE) 10 MG TABS tablet Take 1 tablet (10 mg total) by mouth daily before breakfast.  . LUMIGAN 0.01 % SOLN Place 1 drop into both eyes at bedtime.   . Multiple Vitamin (MULTIVITAMIN) tablet Take 1 tablet by mouth every evening.  Marland Kitchen PARoxetine (PAXIL) 20 MG tablet TAKE 1/2 TABLET(10 MG) BY MOUTH DAILY  . sacubitril-valsartan (ENTRESTO) 97-103 MG Take 1 tablet by mouth 2 (two) times daily.  Marland Kitchen spironolactone (ALDACTONE) 25 MG tablet Take 0.5 tablets (12.5 mg total) by mouth daily.  . traZODone (DESYREL) 50 MG tablet Take 0.5-1 tablets (25-50 mg total) by mouth at bedtime as needed for sleep.  . [DISCONTINUED] furosemide (LASIX) 40 MG tablet Take 80 mg in the morning and 40 mg in the afternoon.  . [DISCONTINUED] sacubitril-valsartan (ENTRESTO) 49-51 MG Take 1 tablet by mouth 2 (two) times daily. Start taking on Monday 03/22/20.     Allergies:   Patient has no known allergies.   Social History   Socioeconomic History  . Marital status: Married    Spouse name: Sunday Spillers  . Number of children: 3  . Years of education: Not on file  . Highest education level: Bachelor's degree (e.g., BA, AB, BS)  Occupational History  . Occupation: Retired    Fish farm manager: RETIRED    Comment: [employer]   Tobacco Use  . Smoking status: Former Smoker    Packs/day: 1.00    Years: 16.00    Pack years: 16.00    Types: Cigarettes    Quit date: 06/30/1977    Years since quitting: 43.0  .  Smokeless tobacco: Never Used  Vaping Use  . Vaping Use: Never used  Substance and Sexual Activity  . Alcohol use: No  . Drug use: No  . Sexual activity: Not on file  Other Topics Concern  . Not on file  Social History Narrative   03/23/20 lives with wife   2 caffeine drinks daily    Social Determinants of Health   Financial Resource Strain: Low Risk   . Difficulty of Paying Living Expenses: Not hard at all  Food Insecurity: No Food Insecurity  . Worried About Charity fundraiser in the Last Year: Never true  . Ran Out of Food in the Last Year: Never true  Transportation Needs: No Transportation Needs  . Lack of Transportation (Medical): No  . Lack of Transportation (Non-Medical): No  Physical Activity:  Inactive  . Days of Exercise per Week: 0 days  . Minutes of Exercise per Session: 0 min  Stress: No Stress Concern Present  . Feeling of Stress : Not at all  Social Connections: Not on file     Family History: The patient's family history includes Colonic polyp in his sister; Heart attack in his father; Heart failure in his father; Hypertension in his father; Stroke in his mother. There is no history of Colon cancer or Stomach cancer.  ROS:   Please see the history of present illness.     EKGs/Labs/Other Studies Reviewed:    The following studies were reviewed today:  Echocardiogram 03/10/2020: Impressions: 1. There is basal-mid inferior and inferolateral left ventricular  hypokinesis. Left ventricular ejection fraction, by estimation, is 45 to  50%. The left ventricle has mildly decreased function. The left ventricle  demonstrates regional wall motion  abnormalities (see scoring diagram/findings for description). Left  ventricular diastolic function could not be evaluated. The average left  ventricular global longitudinal strain is -14.9 %. The global longitudinal  strain is abnormal.  2. Right ventricular systolic function is moderately reduced. The right   ventricular size is moderately enlarged. There is severely elevated  pulmonary artery systolic pressure.  3. Left atrial size was severely dilated.  4. Right atrial size was severely dilated.  5. The mitral valve is normal in structure. Mild to moderate mitral valve  regurgitation.  6. Tricuspid valve regurgitation is moderate.  7. The aortic valve is tricuspid. Aortic valve regurgitation is mild.  Mild aortic valve sclerosis is present, with no evidence of aortic valve  stenosis.  8. There is borderline dilatation of the ascending aorta, measuring 40  mm.  9. The inferior vena cava is normal in size with <50% respiratory  variability, suggesting right atrial pressure of 8 mmHg.   Comparison(s): A prior study was performed on 01/09/19. Endocardial  definition is better on the current study. Wall motion abnormalities were  probably present on the previous study. Estimated right atrial and  pulmonary artery pressure and right ventricular function all appear to have worsened on the current study.  EKG:  EKG not ordered today.  _______________  Right/Left Cardiac Catheterization 03/18/2020:  Prox LAD to Mid LAD lesion is 20% stenosed.  Prox Cx to Mid Cx lesion is 15% stenosed.  Prox RCA lesion is 30% stenosed.  LV end diastolic pressure is moderately elevated.  Hemodynamic findings consistent with moderate pulmonary hypertension.   1. Mild nonobstructive CAD 2. Moderately elevated LV filling pressures. PCWP and EDP 21 mm Hg.  3. Moderate pulmonary HTN with mean PAP 42 mm Hg 4. Preserved cardiac output. Index 2.4.  Plan: will intensify medical therapy. Increase lasix to 80 mg in the morning and 40 mg in the afternoon. Stop lisinopril. After 3 days will begin Entresto 49/51 mg daily. Will titrate as tolerated. Consider SGLT2 inhibitor. May resume Pradaxa this evening.  Diagnostic Dominance: Right     Recent Labs: 02/09/2020: BNP 835.3 03/15/2020: Platelets  204 03/18/2020: Hemoglobin 12.2 04/13/2020: ALT 12 04/27/2020: BUN 17; Creatinine, Ser 1.17; Potassium 3.5; Sodium 143  Recent Lipid Panel    Component Value Date/Time   CHOL 152 04/13/2020 0954   TRIG 56.0 04/13/2020 0954   HDL 58.90 04/13/2020 0954   CHOLHDL 3 04/13/2020 0954   VLDL 11.2 04/13/2020 0954   LDLCALC 82 04/13/2020 0954   LDLCALC 83 10/20/2019 1038    Physical Exam:    Vital Signs: BP (!) 147/78 (BP  Location: Left Arm, Patient Position: Sitting)   Pulse 62   Ht 5\' 8"  (1.727 m)   Wt 199 lb 3.2 oz (90.4 kg)   SpO2 94%   BMI 30.29 kg/m     Wt Readings from Last 3 Encounters:  06/25/20 199 lb 3.2 oz (90.4 kg)  05/27/20 199 lb (90.3 kg)  04/29/20 203 lb (92.1 kg)     General: 80 y.o. male in no acute distress. HEENT: Normocephalic and atraumatic. Sclera clear. Neck: Supple. No JVD. Heart: Irregularly irregular rhythm with normal rate. Distinct S1 and S2. No murmurs, gallops, or rubs. Radial pulses 2+ and equal bilaterally.  Lungs: No increased work of breathing. Clear to ausculation bilaterally. No wheezes, rhonchi, or rales.  Abdomen: Soft, non-distended, and non-tender to palpation.  Extremities: tr lower extremity edema bilaterally. Skin: Warm and dry. Neuro:  No focal deficits. Psych: Normal affect. Responds appropriately.   Assessment:    1. Chronic systolic heart failure ([place])   2. Essential hypertension     Plan:    1. 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 Improved with change in medical therapy - Echo on 03/10/2020 was ordered and showed LVEF of 45-50% with hypokinesis of basal-mid inferior and inferolateral walls, severe biatrial enlargement, mild to moderate MR, and moderate TR. RV also moderately enlarged with moderately reduced systolic function and severely elevated PASP. - R/LHC on 03/18/2020 showed mild non-obstructive CAD with moderately elevated LV filling pressures (LVEDP of 21mmHg),  moderate pulmonary hypertension, and preserved cardiac output. - Continue Lasix  40 mg bid - Entresto will increase to 97/103 mg bid.  -Continue Toprol-XL 50mg  daily.  - add Aldactone 12.5 mg daily. - Discussed importance of daily weights and sodium/fluid restrictions.  - follow up BMET 2 weeks. - follow up in 4 months.  2. Non-Obstructive CAD - Noted on recent cardiac catheterization. - No chest pain. - No aspirin due to need for Pradaxa. - Continue statin.  3. Permanent Atrial Fibrillation - Rates well controlled. - Continue beta-blocker as above.  - Continue Pradaxa.  4. Hypertension - controlled.  - Continue medications for CHF as above. Also on Amlodipine 2.5 mg daily  5. Hyperlipidemia - Lipid panel from 10/2019: Total Cholesterol 150, Triglycerides 53, HDL 54, LDL 83.  - On Lipitor 10mg  daily.  6. Pre-Diabetes - Hemoglobin A1c 6.1.  - Followed by PCP.  7. Obstructive Sleep Apnea - On BiPAP at night. - Followed by Pulmonology.  Disposition: Follow up 4 months   Medication Adjustments/Labs and Tests Ordered: Current medicines are reviewed at length with the patient today.  Concerns regarding medicines are outlined above.  Orders Placed This Encounter  Procedures  . Basic metabolic panel   Meds ordered this encounter  Medications  . furosemide (LASIX) 40 MG tablet    Sig: Take 1 tablet (40 mg total) by mouth 2 (two) times daily. Take 80 mg in the morning and 40 mg in the afternoon.    Dispense:  180 tablet    Refill:  3  . metoprolol succinate (TOPROL-XL) 50 MG 24 hr tablet    Sig: Take 1 tablet (50 mg total) by mouth daily. Take with or immediately following a meal.    Dispense:  90 tablet    Refill:  3  . sacubitril-valsartan (ENTRESTO) 97-103 MG    Sig: Take 1 tablet by mouth 2 (two) times daily.    Dispense:  180 tablet    Refill:  3  .  spironolactone (ALDACTONE) 25 MG tablet    Sig: Take 0.5 tablets (12.5 mg total) by mouth daily.    Dispense:  45  tablet    Refill:  3    Patient Instructions  Increase Entresto to 97/103 mg twice a day  Add aldactone 12.5 mg daily  Continue your other therapy  We will check blood work- kidney function and potassium    Signed, Saron Tweed Martinique, MD  06/25/2020 9:42 AM    [facility]

## 2020-06-21 ENCOUNTER — Other Ambulatory Visit: Payer: Self-pay | Admitting: Internal Medicine

## 2020-06-25 ENCOUNTER — Other Ambulatory Visit: Payer: Self-pay

## 2020-06-25 ENCOUNTER — Encounter: Payer: Self-pay | Admitting: Cardiology

## 2020-06-25 ENCOUNTER — Ambulatory Visit (INDEPENDENT_AMBULATORY_CARE_PROVIDER_SITE_OTHER): Payer: Medicare Other | Admitting: Cardiology

## 2020-06-25 VITALS — BP 147/78 | HR 62 | Ht 68.0 in | Wt 199.2 lb

## 2020-06-25 DIAGNOSIS — I5022 Chronic systolic (congestive) heart failure: Secondary | ICD-10-CM | POA: Diagnosis not present

## 2020-06-25 DIAGNOSIS — I2510 Atherosclerotic heart disease of native coronary artery without angina pectoris: Secondary | ICD-10-CM | POA: Diagnosis not present

## 2020-06-25 DIAGNOSIS — I10 Essential (primary) hypertension: Secondary | ICD-10-CM | POA: Diagnosis not present

## 2020-06-25 DIAGNOSIS — I4821 Permanent atrial fibrillation: Secondary | ICD-10-CM | POA: Diagnosis not present

## 2020-06-25 DIAGNOSIS — E785 Hyperlipidemia, unspecified: Secondary | ICD-10-CM

## 2020-06-25 MED ORDER — SPIRONOLACTONE 25 MG PO TABS
12.5000 mg | ORAL_TABLET | Freq: Every day | ORAL | 3 refills | Status: DC
Start: 2020-06-25 — End: 2021-02-28

## 2020-06-25 MED ORDER — SACUBITRIL-VALSARTAN 97-103 MG PO TABS: 1.0000 | ORAL_TABLET | Freq: Two times a day (BID) | ORAL | 3 refills | Status: AC

## 2020-06-25 MED ORDER — METOPROLOL SUCCINATE ER 50 MG PO TB24: 50.0000 mg | ORAL_TABLET | Freq: Every day | ORAL | 3 refills | Status: AC

## 2020-06-25 MED ORDER — FUROSEMIDE 40 MG PO TABS: 40.0000 mg | ORAL_TABLET | Freq: Two times a day (BID) | ORAL | 3 refills | Status: DC

## 2020-06-25 NOTE — Patient Instructions (Addendum)
Increase Entresto to 97/103 mg twice a day  Add aldactone 12.5 mg daily  Continue your other therapy  We will check blood work- kidney function and potassium

## 2020-06-30 ENCOUNTER — Telehealth: Payer: Self-pay | Admitting: Cardiology

## 2020-06-30 NOTE — Telephone Encounter (Signed)
    Pt c/o medication issue:  1. Name of Medication:   furosemide (LASIX) 40 MG tablet    2. How are you currently taking this medication (dosage and times per day)? Take 1 tablet (40 mg total) by mouth 2 (two) times daily. Take 80 mg in the morning and 40 mg in the afternoon.  3. Are you having a reaction (difficulty breathing--STAT)?   4. What is your medication issue? Samuel Moyer with [facility] calling, she said they need to clarify the direction of this medication.  Ref # [id number]

## 2020-06-30 NOTE — Telephone Encounter (Signed)
Called patient to confirm Lasix dose.Wife stated husband is working and he will not be home until 5 pm or later.She is not sure how he takes Lasix.Advised I will call back.

## 2020-07-01 MED ORDER — FUROSEMIDE 40 MG PO TABS: ORAL_TABLET | ORAL | 3 refills | Status: DC

## 2020-07-01 MED ORDER — EMPAGLIFLOZIN 10 MG PO TABS: 10.0000 mg | ORAL_TABLET | Freq: Every day | ORAL | 3 refills | Status: AC

## 2020-07-01 NOTE — Telephone Encounter (Signed)
Spoke to patient he stated he takes Lasix 40 mg twice a day.He also needs Jardiance refilled.Refills sent to pharmacy.

## 2020-07-03 ENCOUNTER — Other Ambulatory Visit: Payer: Self-pay | Admitting: Internal Medicine

## 2020-07-13 ENCOUNTER — Other Ambulatory Visit: Payer: Self-pay | Admitting: Internal Medicine

## 2020-08-16 ENCOUNTER — Ambulatory Visit: Payer: Medicare Other

## 2020-08-20 ENCOUNTER — Ambulatory Visit: Payer: Medicare Other

## 2020-08-23 DIAGNOSIS — H401131 Primary open-angle glaucoma, bilateral, mild stage: Secondary | ICD-10-CM | POA: Diagnosis not present

## 2020-09-01 ENCOUNTER — Ambulatory Visit: Payer: Medicare Other

## 2020-09-06 ENCOUNTER — Other Ambulatory Visit: Payer: Self-pay | Admitting: Internal Medicine

## 2020-09-06 MED ORDER — CLONAZEPAM 0.5 MG PO TABS: ORAL_TABLET | ORAL | 0 refills | Status: DC

## 2020-09-14 ENCOUNTER — Ambulatory Visit (INDEPENDENT_AMBULATORY_CARE_PROVIDER_SITE_OTHER): Payer: Medicare Other

## 2020-09-14 ENCOUNTER — Other Ambulatory Visit: Payer: Self-pay

## 2020-09-14 VITALS — BP 160/90 | HR 64 | Temp 98.0°F | Ht 68.0 in | Wt 193.8 lb

## 2020-09-14 DIAGNOSIS — Z0000 Encounter for general adult medical examination without abnormal findings: Secondary | ICD-10-CM | POA: Diagnosis not present

## 2020-09-14 NOTE — Patient Instructions (Addendum)
Samuel Moyer , Thank you for taking time to come for your Medicare Wellness Visit. I appreciate your ongoing commitment to your health goals. Please review the following plan we discussed and let me know if I can assist you in the future.   Screening recommendations/referrals: Colonoscopy: Not a candidate for screening due to age Recommended yearly ophthalmology/optometry visit for glaucoma screening and checkup Recommended yearly dental visit for hygiene and checkup  Vaccinations: Influenza vaccine: 10/20/2019; due Fall 2022 Pneumococcal vaccine: 03/05/2015, 11/23/2016 Tdap vaccine: 12/23/2016; due every 10 years (due 12/24/2026) Shingles vaccine: never done; can check with local pharmacy for vaccine   Covid-19: 02/19/2019, 03/12/2019, 11/29/2019, 07/08/2020  Advanced directives: Please bring a copy of your health care power of attorney and living will to the office at your convenience.  Conditions/risks identified: Yes; Client understands the importance of follow-up with providers by attending scheduled visits and discussed goals to eat healthier, increase physical activity, exercise the brain, socialize more, get enough sleep and make time for laughter.  Next appointment: Please schedule your next Medicare Wellness Visit with your Nurse Health Advisor in 1 year by calling [phone].  Preventive Care 81 Years and Older, Male Preventive care refers to lifestyle choices and visits with your health care provider that can promote health and wellness. What does preventive care include? A yearly physical exam. This is also called an annual well check. Dental exams once or twice a year. Routine eye exams. Ask your health care provider how often you should have your eyes checked. Personal lifestyle choices, including: Daily care of your teeth and gums. Regular physical activity. Eating a healthy diet. Avoiding tobacco and drug use. Limiting alcohol use. Practicing safe sex. Taking low doses of  aspirin every day. Taking vitamin and mineral supplements as recommended by your health care provider. What happens during an annual well check? The services and screenings done by your health care provider during your annual well check will depend on your age, overall health, lifestyle risk factors, and family history of disease. Counseling  Your health care provider may ask you questions about your: Alcohol use. Tobacco use. Drug use. Emotional well-being. Home and relationship well-being. Sexual activity. Eating habits. History of falls. Memory and ability to understand (cognition). Work and work Statistician. Screening  You may have the following tests or measurements: Height, weight, and BMI. Blood pressure. Lipid and cholesterol levels. These may be checked every 5 years, or more frequently if you are over 10 years old. Skin check. Lung cancer screening. You may have this screening every year starting at age 23 if you have a 30-pack-year history of smoking and currently smoke or have quit within the past 15 years. Fecal occult blood test (FOBT) of the stool. You may have this test every year starting at age 1. Flexible sigmoidoscopy or colonoscopy. You may have a sigmoidoscopy every 5 years or a colonoscopy every 10 years starting at age 57. Prostate cancer screening. Recommendations will vary depending on your family history and other risks. Hepatitis C blood test. Hepatitis B blood test. Sexually transmitted disease (STD) testing. Diabetes screening. This is done by checking your blood sugar (glucose) after you have not eaten for a while (fasting). You may have this done every 1-3 years. Abdominal aortic aneurysm (AAA) screening. You may need this if you are a current or former smoker. Osteoporosis. You may be screened starting at age 11 if you are at high risk. Talk with your health care provider about your test results, treatment options,  and if necessary, the need for more  tests. Vaccines  Your health care provider may recommend certain vaccines, such as: Influenza vaccine. This is recommended every year. Tetanus, diphtheria, and acellular pertussis (Tdap, Td) vaccine. You may need a Td booster every 10 years. Zoster vaccine. You may need this after age 47. Pneumococcal 13-valent conjugate (PCV13) vaccine. One dose is recommended after age 23. Pneumococcal polysaccharide (PPSV23) vaccine. One dose is recommended after age 41. Talk to your health care provider about which screenings and vaccines you need and how often you need them. This information is not intended to replace advice given to you by your health care provider. Make sure you discuss any questions you have with your health care provider. Document Released: 02/12/2015 Document Revised: 10/06/2015 Document Reviewed: 11/17/2014 Elsevier Interactive Patient Education  2017 [place] Prevention in the Home Falls can cause injuries. They can happen to people of all ages. There are many things you can do to make your home safe and to help prevent falls. What can I do on the outside of my home? Regularly fix the edges of walkways and driveways and fix any cracks. Remove anything that might make you trip as you walk through a door, such as a raised step or threshold. Trim any bushes or trees on the path to your home. Use bright outdoor lighting. Clear any walking paths of anything that might make someone trip, such as rocks or tools. Regularly check to see if handrails are loose or broken. Make sure that both sides of any steps have handrails. Any raised decks and porches should have guardrails on the edges. Have any leaves, snow, or ice cleared regularly. Use sand or salt on walking paths during winter. Clean up any spills in your garage right away. This includes oil or grease spills. What can I do in the bathroom? Use night lights. Install grab bars by the toilet and in the tub and shower.  Do not use towel bars as grab bars. Use non-skid mats or decals in the tub or shower. If you need to sit down in the shower, use a plastic, non-slip stool. Keep the floor dry. Clean up any water that spills on the floor as soon as it happens. Remove soap buildup in the tub or shower regularly. Attach bath mats securely with double-sided non-slip rug tape. Do not have throw rugs and other things on the floor that can make you trip. What can I do in the bedroom? Use night lights. Make sure that you have a light by your bed that is easy to reach. Do not use any sheets or blankets that are too big for your bed. They should not hang down onto the floor. Have a firm chair that has side arms. You can use this for support while you get dressed. Do not have throw rugs and other things on the floor that can make you trip. What can I do in the kitchen? Clean up any spills right away. Avoid walking on wet floors. Keep items that you use a lot in easy-to-reach places. If you need to reach something above you, use a strong step stool that has a grab bar. Keep electrical cords out of the way. Do not use floor polish or wax that makes floors slippery. If you must use wax, use non-skid floor wax. Do not have throw rugs and other things on the floor that can make you trip. What can I do with my stairs? Do not leave  any items on the stairs. Make sure that there are handrails on both sides of the stairs and use them. Fix handrails that are broken or loose. Make sure that handrails are as long as the stairways. Check any carpeting to make sure that it is firmly attached to the stairs. Fix any carpet that is loose or worn. Avoid having throw rugs at the top or bottom of the stairs. If you do have throw rugs, attach them to the floor with carpet tape. Make sure that you have a light switch at the top of the stairs and the bottom of the stairs. If you do not have them, ask someone to add them for you. What else  can I do to help prevent falls? Wear shoes that: Do not have high heels. Have rubber bottoms. Are comfortable and fit you well. Are closed at the toe. Do not wear sandals. If you use a stepladder: Make sure that it is fully opened. Do not climb a closed stepladder. Make sure that both sides of the stepladder are locked into place. Ask someone to hold it for you, if possible. Clearly mark and make sure that you can see: Any grab bars or handrails. First and last steps. Where the edge of each step is. Use tools that help you move around (mobility aids) if they are needed. These include: Canes. Walkers. Scooters. Crutches. Turn on the lights when you go into a dark area. Replace any light bulbs as soon as they burn out. Set up your furniture so you have a clear path. Avoid moving your furniture around. If any of your floors are uneven, fix them. If there are any pets around you, be aware of where they are. Review your medicines with your doctor. Some medicines can make you feel dizzy. This can increase your chance of falling. Ask your doctor what other things that you can do to help prevent falls. This information is not intended to replace advice given to you by your health care provider. Make sure you discuss any questions you have with your health care provider. Document Released: 11/12/2008 Document Revised: 06/24/2015 Document Reviewed: 02/20/2014 Elsevier Interactive Patient Education  2017 [employer].

## 2020-09-14 NOTE — Progress Notes (Signed)
Subjective:   Samuel Moyer. is a 81 y.o. male who presents for Medicare Annual/Subsequent preventive examination.  Review of Systems     Cardiac Risk Factors include: advanced age (>72mn, >>36women);diabetes mellitus;dyslipidemia;family history of premature cardiovascular disease;hypertension;male gender     Objective:    Today's Vitals   09/14/20 0911  BP: (!) 160/90  Pulse: 64  Temp: 98 F (36.7 C)  SpO2: 96%  Weight: 193 lb 12.8 oz (87.9 kg)  Height: '5\' 8"'$  (1.727 m)  PainSc: 3    Body mass index is 29.47 kg/m.  Advanced Directives 09/14/2020 03/18/2020 01/28/2020 01/08/2020 08/15/2019 05/01/2019 09/25/2018  Does Patient Have a Medical Advance Directive? Yes Yes No No Yes Yes Yes  Type of Advance Directive Living will;Healthcare Power of ASan RamonLiving will - - - HChattaroyLiving will HDulacLiving will  Does patient want to make changes to medical advance directive? No - Patient declined - - - No - Patient declined No - Patient declined -  Copy of HJeffersonin Chart? No - copy requested - - - - No - copy requested No - copy requested  Would patient like information on creating a medical advance directive? - - No - Patient declined No - Patient declined - - -    Current Medications (verified) Outpatient Encounter Medications as of 09/14/2020  Medication Sig   allopurinol (ZYLOPRIM) 300 MG tablet TAKE 1 TABLET BY MOUTH DAILY TO LOWER URIC ACID FOR GOUT   amLODipine (NORVASC) 5 MG tablet Take 0.5 tablets (2.5 mg total) by mouth daily.   atorvastatin (LIPITOR) 20 MG tablet Take 1 tablet (20 mg total) by mouth daily.   clonazePAM (KLONOPIN) 0.5 MG tablet TAKE 1/2 TO 1 TABLET(0.25 TO 0.5 MG) BY MOUTH TWICE DAILY AS NEEDED FOR ANXIETY   colchicine 0.6 MG tablet TAKE 1 TABLET BY MOUTH DAILY, TO PREVENT GOUT FLARE   dabigatran (PRADAXA) 150 MG CAPS capsule TAKE 1 CAPSULE EVERY 12 HOURS (Patient  taking differently: Take 150 mg by mouth 2 (two) times daily.)   empagliflozin (JARDIANCE) 10 MG TABS tablet Take 1 tablet (10 mg total) by mouth daily before breakfast.   furosemide (LASIX) 40 MG tablet Take 40 mg twice a day   LUMIGAN 0.01 % SOLN Place 1 drop into both eyes at bedtime.    metoprolol succinate (TOPROL-XL) 50 MG 24 hr tablet Take 1 tablet (50 mg total) by mouth daily. Take with or immediately following a meal.   Multiple Vitamin (MULTIVITAMIN) tablet Take 1 tablet by mouth every evening.   PARoxetine (PAXIL) 20 MG tablet TAKE 1/2 TABLET(10 MG) BY MOUTH DAILY   sacubitril-valsartan (ENTRESTO) 97-103 MG Take 1 tablet by mouth 2 (two) times daily.   spironolactone (ALDACTONE) 25 MG tablet Take 0.5 tablets (12.5 mg total) by mouth daily.   traZODone (DESYREL) 50 MG tablet TAKE 1/2 TO 1 TABLET(25 TO 50 MG) BY MOUTH AT BEDTIME AS NEEDED FOR SLEEP   No facility-administered encounter medications on file as of 09/14/2020.    Allergies (verified) Patient has no known allergies.   History: Past Medical History:  Diagnosis Date   A-fib ([place]    Anxiety    Atrial fibrillation (HCC)    CHF (congestive heart failure) ([place]    Claustrophobia    Occasionally when flying    Colitis    Diabetes mellitus, type 2 ([place]    Fracture of one rib, left side, initial encounter for  closed fracture 12/27/2016   Occurred 12/21/16 after a fall at home.  Left anterior seventh rib   Gout    Hemorrhoids    Hyperlipidemia    Hypertension    LV dysfunction    EF 40-45%   OSA (obstructive sleep apnea)    CPAP machine    PVC's (premature ventricular contractions)    Skin cancer    Past Surgical History:  Procedure Laterality Date   CARDIOVASCULAR STRESS TEST  03/02/2010   EF 50%   MOHS SURGERY     RIGHT/LEFT HEART CATH AND CORONARY ANGIOGRAPHY [name] 03/18/2020   Procedure: RIGHT/LEFT HEART CATH AND CORONARY ANGIOGRAPHY;  Surgeon: Martinique, Peter M, MD;  Location: [facility];  Service:  Cardiovascular;  Laterality: [name];   US ECHOCARDIOGRAPHY  11/15/2009   EF 40-45%   Family History  Problem Relation Age of Onset   Hypertension Father    Heart attack Father        Age 20 (MI)   Heart failure Father    Colonic polyp Sister        and Father   Stroke Mother    Colon cancer Neg Hx    Stomach cancer Neg Hx    Social History   Socioeconomic History   Marital status: Married    Spouse name: Samuel Moyer   Number of children: 3   Years of education: Not on file   Highest education level: Bachelor's degree (e.g., BA, AB, BS)  Occupational History   Occupation: Retired    Fish farm manager: RETIRED    Comment: [employer]   Tobacco Use   Smoking status: Former    Packs/day: 1.00    Years: 16.00    Pack years: 16.00    Types: Cigarettes    Quit date: 06/30/1977    Years since quitting: 43.2   Smokeless tobacco: Never  Vaping Use   Vaping Use: Never used  Substance and Sexual Activity   Alcohol use: No   Drug use: No   Sexual activity: Not on file  Other Topics Concern   Not on file  Social History Narrative   03/23/20 lives with wife   2 caffeine drinks daily    Social Determinants of Health   Financial Resource Strain: Low Risk    Difficulty of Paying Living Expenses: Not hard at all  Food Insecurity: No Food Insecurity   Worried About Charity fundraiser in the Last Year: Never true   Arboriculturist in the Last Year: Never true  Transportation Needs: No Transportation Needs   Lack of Transportation (Medical): No   Lack of Transportation (Non-Medical): No  Physical Activity: Sufficiently Active   Days of Exercise per Week: 5 days   Minutes of Exercise per Session: 30 min  Stress: No Stress Concern Present   Feeling of Stress : Not at all  Social Connections: Socially Integrated   Frequency of Communication with Friends and Family: More than three times a week   Frequency of Social Gatherings with Friends and Family: More than three times a week   Attends  Religious Services: More than 4 times per year   Active Member of [employer] or Organizations: Yes   Attends Music therapist: More than 4 times per year   Marital Status: Married    Tobacco Counseling Counseling given: Not Answered   Clinical Intake:  Pre-visit preparation completed: Yes  Pain : 0-10 Pain Score: 3  Pain Type: Chronic pain Pain Location: Knee Pain Orientation:  Left Pain Onset: More than a month ago Pain Frequency: Intermittent     BMI - recorded: 29.47 Nutritional Status: BMI 25 -29 Overweight Nutritional Risks: None Diabetes: No  How often do you need to have someone help you when you read instructions, pamphlets, or other written materials from your doctor or pharmacy?: 1 - Never What is the last grade level you completed in school?: [place]; [employer]  Diabetic? yes  Interpreter Needed?: No  Information entered by :: Lisette Abu, LPN   Activities of Daily Living In your present state of health, do you have any difficulty performing the following activities: 09/14/2020  Hearing? N  Comment wears hearing aids  Vision? N  Difficulty concentrating or making decisions? N  Walking or climbing stairs? N  Dressing or bathing? N  Doing errands, shopping? N  Preparing Food and eating ? N  Using the Toilet? N  In the past six months, have you accidently leaked urine? N  Do you have problems with loss of bowel control? N  Managing your Medications? N  Managing your Finances? N  Housekeeping or managing your Housekeeping? N  Some recent data might be hidden    Patient Care Team: Binnie Rail, MD as PCP - General (Internal Medicine) Martinique, Peter M, MD as PCP - Cardiology (Cardiology) Martinique, Peter M, MD as Referring Physician (Cardiology) Newt Lukes, AUD (Audiology) Lyndal Pulley, DO as Consulting Physician (Family Medicine) Juluis Rainier as Consulting Physician (Optometry) Sheral Flow, LPN as Licensed Practical  Nurse (Internal Medicine)  Indicate any recent Medical Services you may have received from other than [facility] providers in the past year (date may be approximate).     Assessment:   This is a routine wellness examination for Samuel Moyer.  Hearing/Vision screen Hearing Screening - Comments:: Patient wears hearing aids. Vision Screening - Comments:: Patient wears eye glasses.  Eye exam done every 6 months due to glaucoma.  Patient is seen by Dr. Alois Cliche.  Dietary issues and exercise activities discussed: Current Exercise Habits: The patient has a physically strenuous job, but has no regular exercise apart from work.   Goals Addressed   None   Depression Screen PHQ 2/9 Scores 09/14/2020 08/15/2019 04/02/2019 03/27/2018 03/27/2018 12/04/2016 06/01/2016  PHQ - 2 Score 0 0 0 0 0 0 0  PHQ- 9 Score - - - - - - 0    Fall Risk Fall Risk  09/14/2020 08/15/2019 04/02/2019 03/27/2018 03/27/2018  Falls in the past year? 1 0 0 1 0  Number falls in past yr: 1 0 0 0 -  Injury with Fall? 0 0 0 1 -  Risk for fall due to : - No Fall Risks - - -  Follow up - Falls evaluation completed - Falls prevention discussed;Education provided -    FALL RISK PREVENTION PERTAINING TO THE HOME:  Any stairs in or around the home? Yes  If so, are there any without handrails? No  Home free of loose throw rugs in walkways, pet beds, electrical cords, etc? Yes  Adequate lighting in your home to reduce risk of falls? Yes   ASSISTIVE DEVICES UTILIZED TO PREVENT FALLS:  Life alert? No  Use of a cane, walker or w/c? No  Grab bars in the bathroom? No  Shower chair or bench in shower? No  Elevated toilet seat or a handicapped toilet? No   TIMED UP AND GO:  Was the test performed? Yes .  Length of time to ambulate 10  feet: 6 sec.   Gait steady and fast without use of assistive device  Cognitive Function: Normal cognitive status assessed by direct observation by this Nurse Health Advisor. No abnormalities found.   MMSE - Mini  Mental State Exam 03/23/2020  Orientation to time 3  Orientation to Place 4  Registration 3  Attention/ Calculation 5  Recall 2  Language- name 2 objects 2  Language- repeat 1  Language- follow 3 step command 3  Language- read & follow direction 1  Write a sentence 1  Copy design 1  Total score 26     6CIT Screen 08/15/2019  What Year? 0 points  What month? 0 points  What time? 3 points  Count back from 20 0 points  Months in reverse 0 points  Repeat phrase 0 points  Total Score 3    Immunizations Immunization History  Administered Date(s) Administered   Fluad Quad(high Dose 65+) 10/17/2018, 10/20/2019   Influenza Split 09/30/2013   Influenza Whole 10/21/2007, 10/31/2010, 10/31/2011   Influenza, High Dose Seasonal PF 11/12/2013, 11/24/2014, 11/18/2015, 11/23/2016   Influenza-Unspecified 09/30/2012, 11/24/2014, 11/18/2015, 11/08/2017   PFIZER(Purple Top)SARS-COV-2 Vaccination 02/19/2019, 03/12/2019, 11/29/2019, 07/08/2020   Pneumococcal Conjugate-13 11/12/2013, 03/05/2015   Pneumococcal Polysaccharide-23 11/23/2016   Pneumococcal-Unspecified 01/21/2010   Tdap 12/03/2015, 12/23/2016   Zoster, Live 03/25/2007    TDAP status: Up to date  Flu Vaccine status: Up to date  Pneumococcal vaccine status: Up to date  Covid-19 vaccine status: Completed vaccines  Qualifies for Shingles Vaccine? Yes   Zostavax completed Yes   Shingrix Completed?: No.    Education has been provided regarding the importance of this vaccine. Patient has been advised to call insurance company to determine out of pocket expense if they have not yet received this vaccine. Advised may also receive vaccine at local pharmacy or Health Dept. Verbalized acceptance and understanding.  Screening Tests Health Maintenance  Topic Date Due   Zoster Vaccines- Shingrix (1 of 2) Never done   INFLUENZA VACCINE  08/30/2020   COVID-19 Vaccine (5 - Booster for Pfizer series) 11/07/2020   TETANUS/TDAP  12/24/2026    PNA vac Low Risk Adult  Completed   HPV VACCINES  Aged Out    Health Maintenance  Health Maintenance Due  Topic Date Due   Zoster Vaccines- Shingrix (1 of 2) Never done   INFLUENZA VACCINE  08/30/2020    Colorectal cancer screening: No longer required.   Lung Cancer Screening: (Low Dose CT Chest recommended if Age 2-80 years, 30 pack-year currently smoking OR have quit w/in 15years.) does not qualify.   Lung Cancer Screening Referral: no  Additional Screening:  Hepatitis C Screening: does not qualify; Completed no  Vision Screening: Recommended annual ophthalmology exams for early detection of glaucoma and other disorders of the eye. Is the patient up to date with their annual eye exam?  Yes  Who is the provider or what is the name of the office in which the patient attends annual eye exams? Alois Cliche, OD. If pt is not established with a provider, would they like to be referred to a provider to establish care? No .   Dental Screening: Recommended annual dental exams for proper oral hygiene  Community Resource Referral / Chronic Care Management: CRR required this visit?  No   CCM required this visit?  No      Plan:     I have personally reviewed and noted the following in the patient's chart:   Medical and social history Use  of alcohol, tobacco or illicit drugs  Current medications and supplements including opioid prescriptions. Patient is not currently taking opioid prescriptions. Functional ability and status Nutritional status Physical activity Advanced directives List of other physicians Hospitalizations, surgeries, and ER visits in previous 12 months Vitals Screenings to include cognitive, depression, and falls Referrals and appointments  In addition, I have reviewed and discussed with patient certain preventive protocols, quality metrics, and best practice recommendations. A written personalized care plan for preventive services as well as general  preventive health recommendations were provided to patient.     Sheral Flow, LPN   [id number]   Nurse Notes: n/a

## 2020-09-17 ENCOUNTER — Other Ambulatory Visit: Payer: Self-pay | Admitting: Cardiology

## 2020-09-20 NOTE — Patient Instructions (Signed)
Below is our plan:  We will continue to monitor symptoms. Please let me know if you have any concerns of worsening memory. We can send you for formal cognitive testing with a neuropsychologist if you wish.   Please make sure you are staying well hydrated. I recommend 50-60 ounces daily. Well balanced diet and regular exercise encouraged. Consistent sleep schedule with 6-8 hours recommended.   Please continue follow up with care team as directed.   Follow up with me in as needed   You may receive a survey regarding today's visit. I encourage you to leave honest feed back as I do use this information to improve patient care. Thank you for seeing me today!     Management of Memory Problems   There are some general things you can do to help manage your memory problems.  Your memory may not in fact recover, but by using techniques and strategies you will be able to manage your memory difficulties better.   1)  Establish a routine. Try to establish and then stick to a regular routine.  By doing this, you will get used to what to expect and you will reduce the need to rely on your memory.  Also, try to do things at the same time of day, such as taking your medication or checking your calendar first thing in the morning. Think about think that you can do as a part of a regular routine and make a list.  Then enter them into a daily planner to remind you.  This will help you establish a routine.   2)  Organize your environment. Organize your environment so that it is uncluttered.  Decrease visual stimulation.  Place everyday items such as keys or cell phone in the same place every day (ie.  Basket next to front door) Use post it notes with a brief message to yourself (ie. Turn off light, lock the door) Use labels to indicate where things go (ie. Which cupboards are for food, dishes, etc.) Keep a notepad and pen by the telephone to take messages   3)  Memory Aids A diary or journal/notebook/daily  planner Making a list (shopping list, chore list, to do list that needs to be done) Using an alarm as a reminder (kitchen timer or cell phone alarm) Using cell phone to store information (Notes, Calendar, Reminders) Calendar/White board placed in a prominent position Post-it notes   In order for memory aids to be useful, you need to have good habits.  It's no good remembering to make a note in your journal if you don't remember to look in it.  Try setting aside a certain time of day to look in journal.   4)  Improving mood and managing fatigue. There may be other factors that contribute to memory difficulties.  Factors, such as anxiety, depression and tiredness can affect memory. Regular gentle exercise can help improve your mood and give you more energy. Simple relaxation techniques may help relieve symptoms of anxiety Try to get back to completing activities or hobbies you enjoyed doing in the past. Learn to pace yourself through activities to decrease fatigue. Find out about some local support groups where you can share experiences with others. Try and achieve 7-8 hours of sleep at night.

## 2020-09-20 NOTE — Progress Notes (Signed)
Chief Complaint  Patient presents with   Follow-up    Rm 2, alone. Last seen 03/23/20. Memory f/u. Last MMSE 26/30. Today's: 30/30. 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 is a 80 y.o. male here today for follow up for memory loss with visual hallucinations. He was seen by Dr Leta Baptist in 03/2020. His wife had reported some difficulty managing medications. MRI did show moderate atrophy and microvascular ischemia. He reports that since being seen, visual hallucinations have completely resolved. He is working part time with the [employer]. He continues to have difficulty with shob and fatigue but feels symptoms are stable. He has a significant cardiac history followed by Dr Martinique, cardiology. He is driving without difficulty. Able to performs ADLs. He is managing his medications without difficulty. He does not feel he needs additional workup at this time.    HISTORY (copied from Dr Gladstone Lighter previous note)  80 year old male with 1 year of intermittent visual hallucinations.  He sees formed visual hallucinations such as seeing a nurse, IBD, [facility], and his home.  Symptoms seem to affect him more when he is waking up out of sleep or when it is dark outside.  Episodes are occurring on a monthly basis.  In December 2021 patient fell down out of his bed.  After a few days he was having disorientation of his location and not able to recognize certain family members.  He went to the hospital for evaluation and CT scan of the head was unremarkable for any acute findings.  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   In January February 2022 patient is having more problems with his cardiac and pulmonary issues.  He underwent cardiac catheterization recently.  He has been having more problems with shortness of breath and fatigue.   Last 3 weeks his hallucinations have slightly improved.  He has been  having some issues with ADLs, getting lost driving, memory problems and other confusion.  Patient's wife tries to help him with medication but he has a hard time understanding.  Patient also is hard of hearing and has deteriorating vision.  Patient's wife herself has medical pain issues.  Their children are helping them as much as possible.  REVIEW OF SYSTEMS: Out of a complete 14 system review of symptoms, the patient complains only of the following symptoms, shortness of breath, fatigue and all other reviewed systems are negative.   ALLERGIES: No Known Allergies   HOME MEDICATIONS: Outpatient Medications Prior to Visit  Medication Sig Dispense Refill   allopurinol (ZYLOPRIM) 300 MG tablet TAKE 1 TABLET BY MOUTH DAILY TO LOWER URIC ACID FOR GOUT 90 tablet 1   amLODipine (NORVASC) 5 MG tablet Take 0.5 tablets (2.5 mg total) by mouth daily. 45 tablet 1   atorvastatin (LIPITOR) 20 MG tablet Take 1 tablet (20 mg total) by mouth daily. 90 tablet 3   clonazePAM (KLONOPIN) 0.5 MG tablet TAKE 1/2 TO 1 TABLET(0.25 TO 0.5 MG) BY MOUTH TWICE DAILY AS NEEDED FOR ANXIETY 60 tablet 0   colchicine 0.6 MG tablet TAKE 1 TABLET BY MOUTH DAILY, TO PREVENT GOUT FLARE (Patient taking differently: Take 0.6 mg by mouth. TAKE 1 TABLET BY MOUTH DAILY, TO PREVENT GOUT FLARE. Takes as needed) 90 tablet 0   dabigatran (PRADAXA) 150 MG CAPS capsule TAKE 1 CAPSULE EVERY 12 HOURS 180 capsule 3   empagliflozin (JARDIANCE) 10 MG TABS  tablet Take 1 tablet (10 mg total) by mouth daily before breakfast. 90 tablet 3   furosemide (LASIX) 40 MG tablet Take 40 mg twice a day 180 tablet 3   LUMIGAN 0.01 % SOLN Place 1 drop into both eyes at bedtime.      metoprolol succinate (TOPROL-XL) 50 MG 24 hr tablet Take 1 tablet (50 mg total) by mouth daily. Take with or immediately following a meal. 90 tablet 3   Multiple Vitamin (MULTIVITAMIN) tablet Take 1 tablet by mouth every evening.     PARoxetine (PAXIL) 20 MG tablet TAKE 1/2  TABLET(10 MG) BY MOUTH DAILY 45 tablet 1   sacubitril-valsartan (ENTRESTO) 97-103 MG Take 1 tablet by mouth 2 (two) times daily. 180 tablet 3   spironolactone (ALDACTONE) 25 MG tablet Take 0.5 tablets (12.5 mg total) by mouth daily. 45 tablet 3   traZODone (DESYREL) 50 MG tablet TAKE 1/2 TO 1 TABLET(25 TO 50 MG) BY MOUTH AT BEDTIME AS NEEDED FOR SLEEP 30 tablet 3   No facility-administered medications prior to visit.     PAST MEDICAL HISTORY: Past Medical History:  Diagnosis Date   A-fib [facility])    Anxiety    Atrial fibrillation (HCC)    CHF (congestive heart failure) ([place])    Claustrophobia    Occasionally when flying    Colitis    Diabetes mellitus, type 2 ([place])    Fracture of one rib, left side, initial encounter for closed fracture 12/27/2016   Occurred 12/21/16 after a fall at home.  Left anterior seventh rib   Gout    Hemorrhoids    Hyperlipidemia    Hypertension    LV dysfunction    EF 40-45%   OSA (obstructive sleep apnea)    CPAP machine    PVC's (premature ventricular contractions)    Skin cancer      PAST SURGICAL HISTORY: Past Surgical History:  Procedure Laterality Date   CARDIOVASCULAR STRESS TEST  03/02/2010   EF 50%   MOHS SURGERY     RIGHT/LEFT HEART CATH AND CORONARY ANGIOGRAPHY [name] 03/18/2020   Procedure: RIGHT/LEFT HEART CATH AND CORONARY ANGIOGRAPHY;  Surgeon: Martinique, Peter M, MD;  Location: [place] [facility];  Service: Cardiovascular;  Laterality: [name];   US ECHOCARDIOGRAPHY  11/15/2009   EF 40-45%     FAMILY HISTORY: Family History  Problem Relation Age of Onset   Hypertension Father    Heart attack Father        Age 68 (MI)   Heart failure Father    Colonic polyp Sister        and Father   Stroke Mother    Colon cancer Neg Hx    Stomach cancer Neg Hx      SOCIAL HISTORY: Social History   Socioeconomic History   Marital status: Married    Spouse name: Sunday Spillers   Number of children: 3   Years of education: Not on file   Highest  education level: Bachelor's degree (e.g., BA, AB, BS)  Occupational History   Occupation: Retired    Fish farm manager: RETIRED    Comment: [employer]   Tobacco Use   Smoking status: Former    Packs/day: 1.00    Years: 16.00    Pack years: 16.00    Types: Cigarettes    Quit date: 06/30/1977    Years since quitting: 43.2   Smokeless tobacco: Never  Vaping Use   Vaping Use: Never used  Substance and Sexual Activity   Alcohol use: No  Drug use: No   Sexual activity: Not on file  Other Topics Concern   Not on file  Social History Narrative   03/23/20 lives with wife   2 caffeine drinks daily    Social Determinants of Health   Financial Resource Strain: Low Risk    Difficulty of Paying Living Expenses: Not hard at all  Food Insecurity: No Food Insecurity   Worried About Charity fundraiser in the Last Year: Never true   Arboriculturist in the Last Year: Never true  Transportation Needs: No Transportation Needs   Lack of Transportation (Medical): No   Lack of Transportation (Non-Medical): No  Physical Activity: Sufficiently Active   Days of Exercise per Week: 5 days   Minutes of Exercise per Session: 30 min  Stress: No Stress Concern Present   Feeling of Stress : Not at all  Social Connections: Socially Integrated   Frequency of Communication with Friends and Family: More than three times a week   Frequency of Social Gatherings with Friends and Family: More than three times a week   Attends Religious Services: More than 4 times per year   Active Member of [employer] or Organizations: Yes   Attends Music therapist: More than 4 times per year   Marital Status: Married  Human resources officer Violence: Not At Risk   Fear of Current or Ex-Partner: No   Emotionally Abused: No   Physically Abused: No   Sexually Abused: No     PHYSICAL EXAM  Vitals:   09/21/20 0947  BP: (!) 162/82  Pulse: 71  SpO2: 91%  Weight: 192 lb (87.1 kg)  Height: '5\' 8"'$  (1.727 m)   Body mass  index is 29.19 kg/m.   Generalized: Well developed, in no acute distress  Cardiology: irregularly irregular rhythm, no murmur auscultated  Respiratory: clear to auscultation bilaterally    Neurological examination  Mentation: Alert oriented to time, place, history taking. Follows all commands speech and language fluent Cranial nerve II-XII: Pupils were equal round reactive to light. Extraocular movements were full, visual field were full on confrontational test. Facial sensation and strength were normal. Head turning and shoulder shrug  were normal and symmetric. Motor: The motor testing reveals 5 over 5 strength of all 4 extremities. Good symmetric motor tone is noted throughout.  Sensory: Sensory testing is intact to soft touch on all 4 extremities. No evidence of extinction is noted.  Gait and station: Gait is normal.    DIAGNOSTIC DATA (LABS, IMAGING, TESTING) - I reviewed patient records, labs, notes, testing and imaging myself where available.  Lab Results  Component Value Date   WBC 8.2 03/15/2020   HGB 12.2 (L) 03/18/2020   HCT 36.0 (L) 03/18/2020   MCV 97 03/15/2020   PLT 204 03/15/2020      Component Value Date/Time   NA 143 04/27/2020 0956   K 3.5 04/27/2020 0956   CL 103 04/27/2020 0956   CO2 25 04/27/2020 0956   GLUCOSE 102 (H) 04/27/2020 0956   GLUCOSE 83 04/13/2020 0954   BUN 17 04/27/2020 0956   CREATININE 1.17 04/27/2020 0956   CREATININE 1.08 10/20/2019 1038   CALCIUM 9.2 04/27/2020 0956   PROT 7.2 04/13/2020 0954   ALBUMIN 4.0 04/13/2020 0954   AST 19 04/13/2020 0954   ALT 12 04/13/2020 0954   ALKPHOS 80 04/13/2020 0954   BILITOT 1.1 04/13/2020 0954   GFRNONAA 65 03/15/2020 1123   GFRNONAA >60 01/08/2020 2048   GFRNONAA  64 10/20/2019 1038   GFRAA 75 03/15/2020 1123   GFRAA 75 10/20/2019 1038   Lab Results  Component Value Date   CHOL 152 04/13/2020   HDL 58.90 04/13/2020   LDLCALC 82 04/13/2020   TRIG 56.0 04/13/2020   CHOLHDL 3 04/13/2020    Lab Results  Component Value Date   HGBA1C 6.0 04/13/2020   No results found for: VITAMINB12 Lab Results  Component Value Date   TSH 1.30 06/01/2016    MMSE - Mini Mental State Exam 03/23/2020  Orientation to time 3  Orientation to Place 4  Registration 3  Attention/ Calculation 5  Recall 2  Language- name 2 objects 2  Language- repeat 1  Language- follow 3 step command 3  Language- read & follow direction 1  Write a sentence 1  Copy design 1  Total score 26     No flowsheet data found.   ASSESSMENT AND PLAN  80 y.o. year old male  has a past medical history of A-fib ([place]), Anxiety, Atrial fibrillation ([place]), CHF (congestive heart failure) ([place]), Claustrophobia, Colitis, Diabetes mellitus, type 2 ([place]), Fracture of one rib, left side, initial encounter for closed fracture (12/27/2016), Gout, Hemorrhoids, Hyperlipidemia, Hypertension, LV dysfunction, OSA (obstructive sleep apnea), PVC's (premature ventricular contractions), and Skin cancer. here with    Chronic systolic heart failure (HCC)  Visual hallucinations  Myking is doing well, today. MMSE 30/30. He denies difficulty driving or managing medications. Hallucinations have completely resolved. I have offered referral to neuropsychology for formal cognitive testing, however, he does not feel this is needed. He will continue to monitor symptoms. He will follow up closely with care team. He may call us as needed for worsening concerns. He verbalizes understanding and agreement with this plan.    No orders of the defined types were placed in this encounter.    No orders of the defined types were placed in this encounter.     Debbora Presto, MSN, FNP-C 09/21/2020, 10:32 AM  [facility] [address], [place], [place] [address] [phone]

## 2020-09-21 ENCOUNTER — Ambulatory Visit (INDEPENDENT_AMBULATORY_CARE_PROVIDER_SITE_OTHER): Payer: Medicare Other | Admitting: Family Medicine

## 2020-09-21 ENCOUNTER — Encounter: Payer: Self-pay | Admitting: Family Medicine

## 2020-09-21 VITALS — BP 162/82 | HR 71 | Ht 68.0 in | Wt 192.0 lb

## 2020-09-21 DIAGNOSIS — R441 Visual hallucinations: Secondary | ICD-10-CM

## 2020-09-21 DIAGNOSIS — I5022 Chronic systolic (congestive) heart failure: Secondary | ICD-10-CM | POA: Diagnosis not present

## 2020-09-21 DIAGNOSIS — F0391 Unspecified dementia with behavioral disturbance: Secondary | ICD-10-CM

## 2020-10-12 ENCOUNTER — Encounter: Payer: Self-pay | Admitting: Internal Medicine

## 2020-10-13 ENCOUNTER — Encounter: Payer: Self-pay | Admitting: Internal Medicine

## 2020-10-13 DIAGNOSIS — F0390 Unspecified dementia without behavioral disturbance: Secondary | ICD-10-CM | POA: Insufficient documentation

## 2020-10-13 NOTE — Progress Notes (Signed)
Subjective:    Patient ID: Samuel Moyer., male    DOB: May 22, 1940, 80 y.o.   MRN: [id number]  This visit occurred during the SARS-CoV-2 public health emergency.  Safety protocols were in place, including screening questions prior to the visit, additional usage of staff PPE, and extensive cleaning of exam room while observing appropriate contact time as indicated for disinfecting solutions.     HPI The patient is here for follow up of their chronic medical problems, including htn, hichol, chronic combined HF, permanent Afib, prediabetes, anxiety, gout.  He is here alone.    He is taking all of his medications as prescribed.  He has some questions about his medications.  He feels good and has no concerns.    Medications and allergies reviewed with patient and updated if appropriate.  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 45 tablet 1   atorvastatin (LIPITOR) 20 MG tablet Take 1 tablet (20 mg total)  by mouth daily. 90 tablet 3   clonazePAM (KLONOPIN) 0.5 MG tablet TAKE 1/2 TO 1 TABLET(0.25 TO 0.5 MG) BY MOUTH TWICE DAILY AS NEEDED FOR ANXIETY 60 tablet 0   dabigatran (PRADAXA) 150 MG CAPS capsule TAKE 1 CAPSULE EVERY 12 HOURS 180 capsule 3   empagliflozin (JARDIANCE) 10 MG TABS tablet Take 1 tablet (10 mg total) by mouth daily before breakfast. 90 tablet 3   furosemide (LASIX) 40 MG tablet Take 40 mg twice a day 180 tablet 3   LUMIGAN 0.01 % SOLN Place 1 drop into both eyes at bedtime.      metoprolol succinate (TOPROL-XL) 50 MG 24 hr tablet Take 1 tablet (50 mg total) by mouth daily. Take with or immediately following a meal. 90 tablet 3   Multiple Vitamin (MULTIVITAMIN) tablet Take 1 tablet by mouth every evening.     sacubitril-valsartan (ENTRESTO) 97-103 MG Take 1 tablet by mouth 2 (two) times daily. 180 tablet 3   spironolactone (ALDACTONE) 25 MG tablet Take 0.5 tablets (12.5 mg total) by mouth daily. 45 tablet 3   traZODone (DESYREL) 50 MG tablet TAKE 1/2 TO 1 TABLET(25 TO 50 MG) BY MOUTH AT BEDTIME AS NEEDED FOR SLEEP 30 tablet 3   No current facility-administered medications on file prior to visit.    Past Medical History:  Diagnosis Date   A-fib [facility])    Anxiety    Atrial fibrillation (HCC)    CHF (congestive heart failure) ([place])    Claustrophobia  Occasionally when flying    Colitis    Diabetes mellitus, type 2 ([place])    Fracture of one rib, left side, initial encounter for closed fracture 12/27/2016   Occurred 12/21/16 after a fall at home.  Left anterior seventh rib   Gout    Hemorrhoids    Hyperlipidemia    Hypertension    LV dysfunction    EF 40-45%   OSA (obstructive sleep apnea)    CPAP machine    PVC's (premature ventricular contractions)    Skin cancer     Past Surgical History:  Procedure Laterality Date   CARDIOVASCULAR STRESS TEST  03/02/2010   EF 50%   MOHS SURGERY     RIGHT/LEFT HEART CATH AND CORONARY ANGIOGRAPHY [name] 03/18/2020   Procedure:  RIGHT/LEFT HEART CATH AND CORONARY ANGIOGRAPHY;  Surgeon: Martinique, Peter M, MD;  Location: [place] [facility];  Service: Cardiovascular;  Laterality: [name];   US ECHOCARDIOGRAPHY  11/15/2009   EF 40-45%    Social History   Socioeconomic History   Marital status: Married    Spouse name: Sunday Spillers   Number of children: 3   Years of education: Not on file   Highest education level: Bachelor's degree (e.g., BA, AB, BS)  Occupational History   Occupation: Retired    Fish farm manager: RETIRED    Comment: [employer]   Tobacco Use   Smoking status: Former    Packs/day: 1.00    Years: 16.00    Pack years: 16.00    Types: Cigarettes    Quit date: 06/30/1977    Years since quitting: 43.3   Smokeless tobacco: Never  Vaping Use   Vaping Use: Never used  Substance and Sexual Activity   Alcohol use: No   Drug use: No   Sexual activity: Not on file  Other Topics Concern   Not on file  Social History Narrative   03/23/20 lives with wife   2 caffeine drinks daily    Social Determinants of Health   Financial Resource Strain: Low Risk    Difficulty of Paying Living Expenses: Not hard at all  Food Insecurity: No Food Insecurity   Worried About Charity fundraiser in the Last Year: Never true   Arboriculturist in the Last Year: Never true  Transportation Needs: No Transportation Needs   Lack of Transportation (Medical): No   Lack of Transportation (Non-Medical): No  Physical Activity: Sufficiently Active   Days of Exercise per Week: 5 days   Minutes of Exercise per Session: 30 min  Stress: No Stress Concern Present   Feeling of Stress : Not at all  Social Connections: Socially Integrated   Frequency of Communication with Friends and Family: More than three times a week   Frequency of Social Gatherings with Friends and Family: More than three times a week   Attends Religious Services: More than 4 times per year   Active Member of [employer] or Organizations: Yes   Attends Arts administrator: More than 4 times per year   Marital Status: Married    Family History  Problem Relation Age of Onset   Hypertension Father    Heart attack Father        Age 41 (MI)   Heart failure Father    Colonic polyp Sister        and Father   Stroke Mother    Colon cancer Neg Hx    Stomach cancer Neg Hx     Review of  Systems  Constitutional:  Negative for chills and fever.  Respiratory:  Positive for shortness of breath (with moderate activity). Negative for cough and wheezing.   Cardiovascular:  Negative for chest pain, palpitations and leg swelling.  Neurological:  Negative for light-headedness and headaches.      Objective:   Vitals:   10/14/20 1009  BP: 120/70  Pulse: (!) 51  Temp: 98.2 F (36.8 C)  SpO2: 98%   BP Readings from Last 3 Encounters:  10/14/20 120/70  09/21/20 (!) 162/82  09/14/20 (!) 160/90   Wt Readings from Last 3 Encounters:  10/14/20 187 lb (84.8 kg)  09/21/20 192 lb (87.1 kg)  09/14/20 193 lb 12.8 oz (87.9 kg)   Body mass index is 28.43 kg/m.   Physical Exam    Constitutional: Appears well-developed and well-nourished. No distress.  HENT:  Head: Normocephalic and atraumatic.  Neck: Neck supple. No tracheal deviation present. No thyromegaly present.  No cervical lymphadenopathy Cardiovascular: Normal rate, regular rhythm and normal heart sounds.  No murmur heard. No carotid bruit .  No edema Pulmonary/Chest: Effort normal and breath sounds normal. No respiratory distress. No has no wheezes. No rales.  Skin: Skin is warm and dry. Not diaphoretic.  Chronic skin changes in b/l LE from chronic venous insuff Psychiatric: Normal mood and affect. Behavior is normal.      Assessment & Plan:    See Problem List for Assessment and Plan of chronic medical problems.

## 2020-10-13 NOTE — Patient Instructions (Addendum)
  Blood work was ordered.     Flu immunization administered today.     Medications changes include :   none    Please followup in 6 months

## 2020-10-14 ENCOUNTER — Other Ambulatory Visit: Payer: Self-pay

## 2020-10-14 ENCOUNTER — Ambulatory Visit (INDEPENDENT_AMBULATORY_CARE_PROVIDER_SITE_OTHER): Payer: Medicare Other | Admitting: Internal Medicine

## 2020-10-14 VITALS — BP 120/70 | HR 51 | Temp 98.2°F | Ht 68.0 in | Wt 187.0 lb

## 2020-10-14 DIAGNOSIS — I5022 Chronic systolic (congestive) heart failure: Secondary | ICD-10-CM | POA: Diagnosis not present

## 2020-10-14 DIAGNOSIS — I700 Atherosclerosis of aorta: Secondary | ICD-10-CM

## 2020-10-14 DIAGNOSIS — F419 Anxiety disorder, unspecified: Secondary | ICD-10-CM | POA: Diagnosis not present

## 2020-10-14 DIAGNOSIS — R7303 Prediabetes: Secondary | ICD-10-CM

## 2020-10-14 DIAGNOSIS — I4891 Unspecified atrial fibrillation: Secondary | ICD-10-CM

## 2020-10-14 DIAGNOSIS — I2510 Atherosclerotic heart disease of native coronary artery without angina pectoris: Secondary | ICD-10-CM

## 2020-10-14 DIAGNOSIS — M1A0790 Idiopathic chronic gout, unspecified ankle and foot, without tophus (tophi): Secondary | ICD-10-CM | POA: Diagnosis not present

## 2020-10-14 DIAGNOSIS — E7800 Pure hypercholesterolemia, unspecified: Secondary | ICD-10-CM

## 2020-10-14 DIAGNOSIS — I10 Essential (primary) hypertension: Secondary | ICD-10-CM | POA: Diagnosis not present

## 2020-10-14 LAB — CBC WITH DIFFERENTIAL/PLATELET
Basophils Absolute: 0.1 10*3/uL (ref 0.0–0.1)
Basophils Relative: 0.9 % (ref 0.0–3.0)
Eosinophils Absolute: 0.2 10*3/uL (ref 0.0–0.7)
Eosinophils Relative: 3.4 % (ref 0.0–5.0)
HCT: 46.2 % (ref 39.0–52.0)
Hemoglobin: 15.1 g/dL (ref 13.0–17.0)
Lymphocytes Relative: 23 % (ref 12.0–46.0)
Lymphs Abs: 1.4 10*3/uL (ref 0.7–4.0)
MCHC: 32.7 g/dL (ref 30.0–36.0)
MCV: 102 fl — ABNORMAL HIGH (ref 78.0–100.0)
Monocytes Absolute: 0.7 10*3/uL (ref 0.1–1.0)
Monocytes Relative: 11.6 % (ref 3.0–12.0)
Neutro Abs: 3.6 10*3/uL (ref 1.4–7.7)
Neutrophils Relative %: 61.1 % (ref 43.0–77.0)
Platelets: 180 10*3/uL (ref 150.0–400.0)
RBC: 4.53 Mil/uL (ref 4.22–5.81)
RDW: 18.1 % — ABNORMAL HIGH (ref 11.5–15.5)
WBC: 6 10*3/uL (ref 4.0–10.5)

## 2020-10-14 LAB — LIPID PANEL
Cholesterol: 161 mg/dL (ref 0–200)
HDL: 66.6 mg/dL (ref >39.00)
LDL Cholesterol: 81 mg/dL (ref 0–99)
NonHDL: 94.78
Total CHOL/HDL Ratio: 2
Triglycerides: 71 mg/dL (ref 0.0–149.0)
VLDL: 14.2 mg/dL (ref 0.0–40.0)

## 2020-10-14 LAB — COMPREHENSIVE METABOLIC PANEL
ALT: 19 U/L (ref 0–53)
AST: 26 U/L (ref 0–37)
Albumin: 4.1 g/dL (ref 3.5–5.2)
Alkaline Phosphatase: 83 U/L (ref 39–117)
BUN: 17 mg/dL (ref 6–23)
CO2: 29 mEq/L (ref 19–32)
Calcium: 9.5 mg/dL (ref 8.4–10.5)
Chloride: 102 mEq/L (ref 96–112)
Creatinine, Ser: 1.22 mg/dL (ref 0.40–1.50)
GFR: 55.75 mL/min — ABNORMAL LOW (ref >60.00)
Glucose, Bld: 90 mg/dL (ref 70–99)
Potassium: 3.9 mEq/L (ref 3.5–5.1)
Sodium: 139 mEq/L (ref 135–145)
Total Bilirubin: 0.9 mg/dL (ref 0.2–1.2)
Total Protein: 7.2 g/dL (ref 6.0–8.3)

## 2020-10-14 LAB — HEMOGLOBIN A1C: Hgb A1c MFr Bld: 6 % (ref 4.6–6.5)

## 2020-10-14 MED ORDER — COLCHICINE 0.6 MG PO TABS: ORAL_TABLET | ORAL | 0 refills | Status: AC

## 2020-10-14 MED ORDER — PAROXETINE HCL 20 MG PO TABS: ORAL_TABLET | ORAL | 1 refills | Status: AC

## 2020-10-14 NOTE — Addendum Note (Signed)
Addended by: Boris Lown B on: 10/14/2020 10:57 AM   Modules accepted: Orders

## 2020-10-14 NOTE — Assessment & Plan Note (Signed)
Chronic Controlled, stable Continue paxil 10 mg daily, clonazepam 0.25-0.5 mg bid prn

## 2020-10-14 NOTE — Assessment & Plan Note (Signed)
Chronic Euvolemic on exam On entresto, metoprolol, spironolactone, lasix and jardiance

## 2020-10-14 NOTE — Assessment & Plan Note (Signed)
Chronic Continue atorvastatin 20 mg daily Lipid panel today Encouraged regular exercise

## 2020-10-14 NOTE — Assessment & Plan Note (Signed)
Chronic BP well controlled Continue amlodipine 2.5 mg qd, metoprolol xl 50 mg qd, entresto 97-103 mg bid, spironolactone 12.5 mg  cmp

## 2020-10-14 NOTE — Assessment & Plan Note (Addendum)
Chronic Controlled - no gout symptoms Continue allopurinol 300 mg daily, colchicine 0.6 mg daily prn for gout flare ( change to 2 tabs x 1, 1 tab one hour later)

## 2020-10-14 NOTE — Assessment & Plan Note (Signed)
Chronic Check lipid panel  Continue atorvastatin 20 mg Regular exercise and healthy diet encouraged

## 2020-10-14 NOTE — Assessment & Plan Note (Addendum)
Chronic Following with cardiology On pradaxa, metoprolol Cmp, cbc

## 2020-10-14 NOTE — Assessment & Plan Note (Signed)
Chronic Check a1c Low sugar / carb diet Stressed regular exercise  

## 2020-10-27 NOTE — Progress Notes (Signed)
Cardiology Office Note:    Date:  10/29/2020   ID:  Samuel Dalton., DOB [birth year], MRN [id number]  PCP:  Binnie Rail, MD  Cardiologist:  Dorothye Berni Martinique, MD  Electrophysiologist:  None   Referring MD: Binnie Rail, MD   Chief Complaint: follow-up of CHF  History of Present Illness:    Samuel Romberger. is a 80 y.o. 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 Martinique and presents today for follow-up of CHF.  Patient was seen by Dr. Martinique on 02/05/2018 at which time her reported dyspnea on exertion (such as walking up a hill or carrying something) and weight gain.He did admit to eating out a lot and eating a lot of processed meats. Lasix was increased to 40mg  twice daily. BNP was ordered and came back elevated at 835. Echo was ordered and showed LVEF of 45-50% with hypokinesis of basal-mid inferior and inferolateral walls, severe biatrial enlargement, mild to moderate MR, and moderate TR. RV also moderately enlarged with moderately reduced systolic function and severely elevated PASP. He was seen by Dr. Martinique again on 03/15/2020 for follow-up at which time he reported increased urine output with increase in Lasix but weight and shortness of breath were unchanged. Right/left cardiac catheterization was recommended for further evaluation. This was performed on 03/18/2020 and showed mild non-obstructive CAD with moderately elevated LV filling pressures (LVEDP of 38mmHg), moderate pulmonary hypertension, and preserved cardiac output. Lasix was increased to 80mg  in the morning and 40mg  in the afternoon. Lisinopril was stopped with plan to start Entresto 49/51 twice daily after 3  day washout period.   Patient called our office on 03/22/2020 with reports of worsening shortness of breath since cardiac catheterization as well as dizziness. He stated he never started the [facility] because it was too expensive. This visit was scheduled or further evaluation. He was given a one month supply. Has been taking Entresto since then at 49/51 mg bid dose. Followed by Pharm D. Lasix reduced to 40 mg bid. On Jardiance. Toprol XL at 50 mg daily. Later Entresto dose increased to 97/103 mg bid and also on aldatone.   On follow up today he is doing well. He was active working at [employer]. Now the season has ended. States he is able to go up 29 steps without giving out or SOB. No edema. Weight is stable. No chest pain or dizziness. He is tolerating medications well. Unaware of Afib.    Past Medical History:  Diagnosis Date   A-fib [facility])    Anxiety    Atrial fibrillation (HCC)    CHF (congestive heart failure) ([place])    Claustrophobia    Occasionally when flying    Colitis    Diabetes mellitus, type 2 ([place])    Fracture of one rib, left side, initial encounter for closed fracture 12/27/2016   Occurred 12/21/16 after a fall at home.  Left anterior seventh rib   Gout    Hemorrhoids    Hyperlipidemia    Hypertension    LV dysfunction    EF 40-45%   OSA (obstructive sleep apnea)    CPAP machine    PVC's (premature ventricular contractions)    Skin cancer     Past Surgical History:  Procedure Laterality Date   CARDIOVASCULAR STRESS TEST  03/02/2010   EF 50%   MOHS SURGERY     RIGHT/LEFT HEART CATH AND CORONARY ANGIOGRAPHY [name] 03/18/2020   Procedure: RIGHT/LEFT HEART CATH AND CORONARY ANGIOGRAPHY;  Surgeon: Martinique, Mazey Mantell M, MD;  Location: [facility];  Service: Cardiovascular;  Laterality: [name];   US ECHOCARDIOGRAPHY  11/15/2009   EF 40-45%    Current Medications: Current Meds  Medication Sig   allopurinol (ZYLOPRIM) 300 MG tablet TAKE 1 TABLET BY MOUTH DAILY TO LOWER  URIC ACID FOR GOUT   amLODipine (NORVASC) 5 MG tablet Take 0.5 tablets (2.5 mg total) by mouth daily.   atorvastatin (LIPITOR) 20 MG tablet Take 1 tablet (20 mg total) by mouth daily.   clonazePAM (KLONOPIN) 0.5 MG tablet TAKE 1/2 TO 1 TABLET(0.25 TO 0.5 MG) BY MOUTH TWICE DAILY AS NEEDED FOR ANXIETY   colchicine 0.6 MG tablet For gout flare take 2 pills po x 1 and 1 pill 1 hour later   dabigatran (PRADAXA) 150 MG CAPS capsule TAKE 1 CAPSULE EVERY 12 HOURS   empagliflozin (JARDIANCE) 10 MG TABS tablet Take 1 tablet (10 mg total) by mouth daily before breakfast.   furosemide (LASIX) 40 MG tablet Take 40 mg twice a day   LUMIGAN 0.01 % SOLN Place 1 drop into both eyes at bedtime.    metoprolol succinate (TOPROL-XL) 50 MG 24 hr tablet Take 1 tablet (50 mg total) by mouth daily. Take with or immediately following a meal.   Multiple Vitamin (MULTIVITAMIN) tablet Take 1 tablet by mouth every evening.   PARoxetine (PAXIL) 20 MG tablet TAKE 1/2 TABLET(10 MG) BY MOUTH DAILY   sacubitril-valsartan (ENTRESTO) 97-103 MG Take 1 tablet by mouth 2 (two) times daily.   spironolactone (ALDACTONE) 25 MG tablet Take 0.5 tablets (12.5 mg total) by mouth daily.   traZODone (DESYREL) 50 MG tablet TAKE 1/2 TO 1 TABLET(25 TO 50 MG) BY MOUTH AT BEDTIME AS NEEDED FOR SLEEP     Allergies:   Patient has no known allergies.   Social History   Socioeconomic History   Marital status: Married    Spouse name: Sunday Spillers   Number of children: 3   Years of education: Not on file   Highest education level: Bachelor's degree (e.g., BA, AB, BS)  Occupational History   Occupation: Retired    Fish farm manager: RETIRED    Comment: [employer]   Tobacco Use   Smoking status: Former    Packs/day: 1.00    Years: 16.00    Pack years: 16.00    Types: Cigarettes    Quit date: 06/30/1977    Years since quitting: 43.3   Smokeless tobacco: Never  Vaping Use   Vaping Use: Never used  Substance and Sexual Activity   Alcohol use: No    Drug use: No   Sexual activity: Not on file  Other Topics Concern   Not on file  Social History Narrative   03/23/20 lives with wife   2 caffeine drinks daily    Social Determinants of Health   Financial Resource Strain: Low Risk    Difficulty of Paying Living Expenses: Not hard at all  Food Insecurity: No Food Insecurity   Worried About Charity fundraiser in the Last Year: Never true   Arboriculturist in the Last Year: Never true  Transportation Needs: No Transportation Needs   Lack of Transportation (Medical): No   Lack of Transportation (Non-Medical): No  Physical Activity:  Sufficiently Active   Days of Exercise per Week: 5 days   Minutes of Exercise per Session: 30 min  Stress: No Stress Concern Present   Feeling of Stress : Not at all  Social Connections: Socially Integrated   Frequency of Communication with Friends and Family: More than three times a week   Frequency of Social Gatherings with Friends and Family: More than three times a week   Attends Religious Services: More than 4 times per year   Active Member of [employer] or Organizations: Yes   Attends Music therapist: More than 4 times per year   Marital Status: Married     Family History: The patient's family history includes Colonic polyp in his sister; Heart attack in his father; Heart failure in his father; Hypertension in his father; Stroke in his mother. There is no history of Colon cancer or Stomach cancer.  ROS:   Please see the history of present illness.     EKGs/Labs/Other Studies Reviewed:    The following studies were reviewed today:  Echocardiogram 03/10/2020: Impressions:  1. There is basal-mid inferior and inferolateral left ventricular  hypokinesis. Left ventricular ejection fraction, by estimation, is 45 to  50%. The left ventricle has mildly decreased function. The left ventricle  demonstrates regional wall motion  abnormalities (see scoring diagram/findings for description). Left   ventricular diastolic function could not be evaluated. The average left  ventricular global longitudinal strain is -14.9 %. The global longitudinal  strain is abnormal.   2. Right ventricular systolic function is moderately reduced. The right  ventricular size is moderately enlarged. There is severely elevated  pulmonary artery systolic pressure.   3. Left atrial size was severely dilated.   4. Right atrial size was severely dilated.   5. The mitral valve is normal in structure. Mild to moderate mitral valve  regurgitation.   6. Tricuspid valve regurgitation is moderate.   7. The aortic valve is tricuspid. Aortic valve regurgitation is mild.  Mild aortic valve sclerosis is present, with no evidence of aortic valve  stenosis.   8. There is borderline dilatation of the ascending aorta, measuring 40  mm.   9. The inferior vena cava is normal in size with <50% respiratory  variability, suggesting right atrial pressure of 8 mmHg.   Comparison(s): A prior study was performed on 01/09/19. Endocardial  definition is better on the current study. Wall motion abnormalities were  probably present on the previous study. Estimated right atrial and  pulmonary artery pressure and right ventricular function all appear to have worsened on the current study.  EKG:  EKG not ordered today.  _______________  Right/Left Cardiac Catheterization 03/18/2020: Prox LAD to Mid LAD lesion is 20% stenosed. Prox Cx to Mid Cx lesion is 15% stenosed. Prox RCA lesion is 30% stenosed. LV end diastolic pressure is moderately elevated. Hemodynamic findings consistent with moderate pulmonary hypertension.   1. Mild nonobstructive CAD 2. Moderately elevated LV filling pressures. PCWP and EDP 21 mm Hg.  3. Moderate pulmonary HTN with mean PAP 42 mm Hg 4. Preserved cardiac output. Index 2.4.   Plan: will intensify medical therapy. Increase lasix to 80 mg in the morning and 40 mg in the afternoon. Stop lisinopril.  After 3 days will begin Entresto 49/51 mg daily. Will titrate as tolerated. Consider SGLT2 inhibitor. May resume Pradaxa this evening.  Diagnostic Dominance: Right     Recent Labs: 02/09/2020: BNP 835.3 10/14/2020: ALT 19; BUN 17; Creatinine, Ser 1.22; Hemoglobin 15.1; Platelets  180.0; Potassium 3.9; Sodium 139  Recent Lipid Panel    Component Value Date/Time   CHOL 161 10/14/2020 1057   TRIG 71.0 10/14/2020 1057   HDL 66.60 10/14/2020 1057   CHOLHDL 2 10/14/2020 1057   VLDL 14.2 10/14/2020 1057   LDLCALC 81 10/14/2020 1057   LDLCALC 83 10/20/2019 1038    Physical Exam:    Vital Signs: BP 120/60 (BP Location: Left Arm)   Pulse (!) 49   Ht 5\' 8"  (1.727 m)   Wt 188 lb 9.6 oz (85.5 kg)   SpO2 97%   BMI 28.68 kg/m     Wt Readings from Last 3 Encounters:  10/29/20 188 lb 9.6 oz (85.5 kg)  10/14/20 187 lb (84.8 kg)  09/21/20 192 lb (87.1 kg)     General: 81 y.o. male in no acute distress. HEENT: Normocephalic and atraumatic. Sclera clear. Neck: Supple. No JVD. Heart: Irregularly irregular rhythm with normal rate. Distinct S1 and S2. No murmurs, gallops, or rubs. Radial pulses 2+ and equal bilaterally.  Lungs: No increased work of breathing. Clear to ausculation bilaterally. No wheezes, rhonchi, or rales.  Abdomen: Soft, non-distended, and non-tender to palpation.  Extremities: no  edema bilaterally. hyperpigmentation Skin: Warm and dry. Neuro:  No focal deficits. Psych: Normal affect. Responds appropriately.   Assessment:    1. Chronic systolic heart failure ([place])   2. Essential hypertension   3. Permanent atrial fibrillation ([place])   4. Hyperlipidemia, unspecified hyperlipidemia type      Plan:    1.  Chronic Combined CHF Non-Ischemic Cardiomyopathy - Patient is symptomatic class 1. Improved with change in medical therapy - Echo on 03/10/2020 was ordered and showed LVEF of 45-50% with hypokinesis of basal-mid inferior and inferolateral walls, severe biatrial  enlargement, mild to moderate MR, and moderate TR. RV also moderately enlarged with moderately reduced systolic function and severely elevated PASP. - R/LHC on 03/18/2020 showed mild non-obstructive CAD with moderately elevated LV filling pressures (LVEDP of 64mmHg), moderate pulmonary hypertension, and preserved cardiac output. - Continue Lasix  40 mg bid - on optimal therapy with Entresto, aldactone, Toprol XL and Jardiance.  - Discussed importance of daily weights and sodium/fluid restrictions.  - follow up in 6 months.  2. Non-Obstructive CAD - Noted on recent cardiac catheterization. - No chest pain. - No aspirin due to need for Pradaxa. - Continue statin.  3. Permanent Atrial Fibrillation - Rates well controlled. - Continue beta-blocker as above.  - Continue Pradaxa.  4. Hypertension - controlled.  - Continue medications for CHF as above. Also on Amlodipine  5. Hyperlipidemia - Lipid panel from 10/2019: Total Cholesterol 150, Triglycerides 53, HDL 54, LDL 83.  - On Lipitor 10mg  daily.  6. Pre-Diabetes - Hemoglobin A1c 6.1.  - Followed by PCP.  7. Obstructive Sleep Apnea - On BiPAP at night. - Followed by Pulmonology.  Disposition: Follow up 6 months   Medication Adjustments/Labs and Tests Ordered: Current medicines are reviewed at length with the patient today.  Concerns regarding medicines are outlined above.  No orders of the defined types were placed in this encounter.  No orders of the defined types were placed in this encounter.   There are no Patient Instructions on file for this visit.   Signed, Devonda Pequignot Martinique, MD  10/29/2020 3:20 PM    [facility]

## 2020-10-29 ENCOUNTER — Other Ambulatory Visit: Payer: Self-pay

## 2020-10-29 ENCOUNTER — Ambulatory Visit (INDEPENDENT_AMBULATORY_CARE_PROVIDER_SITE_OTHER): Payer: Medicare Other | Admitting: Cardiology

## 2020-10-29 ENCOUNTER — Encounter: Payer: Self-pay | Admitting: Cardiology

## 2020-10-29 VITALS — BP 120/60 | HR 49 | Ht 68.0 in | Wt 188.6 lb

## 2020-10-29 DIAGNOSIS — I10 Essential (primary) hypertension: Secondary | ICD-10-CM | POA: Diagnosis not present

## 2020-10-29 DIAGNOSIS — E785 Hyperlipidemia, unspecified: Secondary | ICD-10-CM | POA: Diagnosis not present

## 2020-10-29 DIAGNOSIS — I5022 Chronic systolic (congestive) heart failure: Secondary | ICD-10-CM

## 2020-10-29 DIAGNOSIS — I2510 Atherosclerotic heart disease of native coronary artery without angina pectoris: Secondary | ICD-10-CM

## 2020-10-29 DIAGNOSIS — I4821 Permanent atrial fibrillation: Secondary | ICD-10-CM | POA: Diagnosis not present

## 2020-11-22 ENCOUNTER — Other Ambulatory Visit: Payer: Self-pay | Admitting: Internal Medicine

## 2020-12-16 ENCOUNTER — Other Ambulatory Visit: Payer: Self-pay

## 2020-12-16 ENCOUNTER — Encounter (HOSPITAL_BASED_OUTPATIENT_CLINIC_OR_DEPARTMENT_OTHER): Payer: Self-pay

## 2020-12-16 ENCOUNTER — Emergency Department (HOSPITAL_BASED_OUTPATIENT_CLINIC_OR_DEPARTMENT_OTHER): Payer: Medicare Other

## 2020-12-16 ENCOUNTER — Emergency Department (HOSPITAL_BASED_OUTPATIENT_CLINIC_OR_DEPARTMENT_OTHER)
Admission: EM | Admit: 2020-12-16 | Discharge: 2020-12-16 | Disposition: A | Discharge status: Home / Self Care | Payer: Medicare Other | Attending: Emergency Medicine | Admitting: Emergency Medicine

## 2020-12-16 DIAGNOSIS — I110 Hypertensive heart disease with heart failure: Secondary | ICD-10-CM | POA: Insufficient documentation

## 2020-12-16 DIAGNOSIS — Z87891 Personal history of nicotine dependence: Secondary | ICD-10-CM | POA: Diagnosis not present

## 2020-12-16 DIAGNOSIS — R42 Dizziness and giddiness: Secondary | ICD-10-CM | POA: Diagnosis not present

## 2020-12-16 DIAGNOSIS — W07XXXA Fall from chair, initial encounter: Secondary | ICD-10-CM | POA: Insufficient documentation

## 2020-12-16 DIAGNOSIS — Y92019 Unspecified place in single-family (private) house as the place of occurrence of the external cause: Secondary | ICD-10-CM | POA: Insufficient documentation

## 2020-12-16 DIAGNOSIS — W19XXXA Unspecified fall, initial encounter: Secondary | ICD-10-CM

## 2020-12-16 DIAGNOSIS — Z8616 Personal history of COVID-19: Secondary | ICD-10-CM | POA: Insufficient documentation

## 2020-12-16 DIAGNOSIS — I5023 Acute on chronic systolic (congestive) heart failure: Secondary | ICD-10-CM | POA: Diagnosis not present

## 2020-12-16 DIAGNOSIS — I517 Cardiomegaly: Secondary | ICD-10-CM | POA: Diagnosis not present

## 2020-12-16 DIAGNOSIS — Z20822 Contact with and (suspected) exposure to covid-19: Secondary | ICD-10-CM | POA: Diagnosis not present

## 2020-12-16 DIAGNOSIS — R531 Weakness: Secondary | ICD-10-CM | POA: Diagnosis not present

## 2020-12-16 DIAGNOSIS — J45909 Unspecified asthma, uncomplicated: Secondary | ICD-10-CM | POA: Diagnosis not present

## 2020-12-16 DIAGNOSIS — Z79899 Other long term (current) drug therapy: Secondary | ICD-10-CM | POA: Diagnosis not present

## 2020-12-16 DIAGNOSIS — Z85828 Personal history of other malignant neoplasm of skin: Secondary | ICD-10-CM | POA: Insufficient documentation

## 2020-12-16 DIAGNOSIS — I509 Heart failure, unspecified: Secondary | ICD-10-CM

## 2020-12-16 DIAGNOSIS — R0602 Shortness of breath: Secondary | ICD-10-CM | POA: Diagnosis not present

## 2020-12-16 LAB — CBC
HCT: 47.5 % (ref 39.0–52.0)
Hemoglobin: 15.1 g/dL (ref 13.0–17.0)
MCH: 34 pg (ref 26.0–34.0)
MCHC: 31.8 g/dL (ref 30.0–36.0)
MCV: 107 fL — ABNORMAL HIGH (ref 80.0–100.0)
Platelets: 172 10*3/uL (ref 150–400)
RBC: 4.44 MIL/uL (ref 4.22–5.81)
RDW: 18.7 % — ABNORMAL HIGH (ref 11.5–15.5)
WBC: 8.5 10*3/uL (ref 4.0–10.5)
nRBC: 0 % (ref 0.0–0.2)

## 2020-12-16 LAB — BASIC METABOLIC PANEL
Anion gap: 12 (ref 5–15)
BUN: 35 mg/dL — ABNORMAL HIGH (ref 8–23)
CO2: 22 mmol/L (ref 22–32)
Calcium: 9.4 mg/dL (ref 8.9–10.3)
Chloride: 107 mmol/L (ref 98–111)
Creatinine, Ser: 1.58 mg/dL — ABNORMAL HIGH (ref 0.61–1.24)
GFR, Estimated: 44 mL/min — ABNORMAL LOW (ref >60)
Glucose, Bld: 99 mg/dL (ref 70–99)
Potassium: 4 mmol/L (ref 3.5–5.1)
Sodium: 141 mmol/L (ref 135–145)

## 2020-12-16 LAB — RESP PANEL BY RT-PCR (FLU A&B, COVID) ARPGX2
Influenza A by PCR: NEGATIVE
Influenza B by PCR: NEGATIVE
SARS Coronavirus 2 by RT PCR: NEGATIVE

## 2020-12-16 LAB — BRAIN NATRIURETIC PEPTIDE: B Natriuretic Peptide: 2962.8 pg/mL — ABNORMAL HIGH (ref 0.0–100.0)

## 2020-12-16 LAB — PROTIME-INR
INR: 2.1 — ABNORMAL HIGH (ref 0.8–1.2)
Prothrombin Time: 23.3 seconds — ABNORMAL HIGH (ref 11.4–15.2)

## 2020-12-16 LAB — TROPONIN I (HIGH SENSITIVITY)
Troponin I (High Sensitivity): 16 ng/L (ref <18)
Troponin I (High Sensitivity): 17 ng/L (ref <18)

## 2020-12-16 MED ORDER — FUROSEMIDE 10 MG/ML IJ SOLN
40.0000 mg | Freq: Once | INTRAMUSCULAR | Status: AC
  Administered 2020-12-16: 20:00:00 40 mg via INTRAVENOUS
  Filled 2020-12-16: qty 4

## 2020-12-16 NOTE — ED Notes (Signed)
RT Note: Repeat Troponin obtained/labelled/given to lab, RN aware.

## 2020-12-16 NOTE — ED Notes (Signed)
Heart sounds faint but S1 S2 audiable

## 2020-12-16 NOTE — ED Notes (Signed)
Discharge instructions discussed with pt and son at bedside. Pt verbalized understanding with no questions at this tim. Pt to follow up with cardiology. Pt provided with urinal.

## 2020-12-16 NOTE — ED Provider Notes (Signed)
Patient was reassessed after signout.  He has remained stable on room air with no hypoxia or tachypnea here in the ED.  He remains a stable rate controlled A. fib with a heart rate in the 60s or 70s.  He reports he has chronic A. fib and is compliant with Pradaxa for that.  I suspect he may have had PVCs or perhaps an episode of rapid A. fib at home, but I doubt this is ACS, he had no significant coronary disease within the past 2 years on his left heart cath.  Clinically this does seem more consistent with gradually worsening congestive heart failure.  He reports gradual weight loss over the past several weeks, about 10 pounds, and also orthopnea and dyspnea with exertion.  He would prefer to go home if possible.  Have advised that we double his morning dose of Lasix for the next 7 days from 40 mg to 80 mg.  He can continue his 40 mg at night.  We will give a dose of IV Lasix here.  Otherwise I do think he is stable for discharge with cardiology follow-up.  His son was present as well as his daughter by phone for the entirety of my exam and for the plan.  They are all in agreement.   Wyvonnia Dusky, MD 12/16/20 [phone]

## 2020-12-16 NOTE — ED Triage Notes (Signed)
Patient here POV from Home from [place].  Patient has been feeling SOB over the past few days and today the Patient was in a seated position when he became lightheaded and slid out of his chair.  No Complete LOC. A&Ox4. GCS 15. Ambulatory with Cane. No Pain.

## 2020-12-16 NOTE — ED Provider Notes (Signed)
[facility] EMERG[facility] DEPT Provider Note   CSN: [id number] Arrival date & time: 12/16/20  1305     History Chief Complaint  Patient presents with   Samuel Moyer. is a 80 y.o. male.  HPI Patient's past medical history includes chronic atrial fibrillation anticoagulated on Pradaxa.  He has history of congestive heart failure and sleep apnea on BiPAP.  Patient reports for a day or so he has noticed he is a bit more short of breath.  He identifies this when he is come down the stairs and with some exertion.  Patient reports that this is a change since last summer.  He reports during summer he was pretty active outside and not having limitations of shortness of breath.  Patient reports he does take Lasix and is compliant with his medications.  Today he was getting ready to go out and meet his son-in-law to eat he had come down the stairs and sat down at a chair and got a little weak and dizzy.  He reports that from up a seated position, he slid to the floor due to weakness but did not have loss of consciousness or any associated chest pain or headache.  He did not strike his head or injure himself.  He reports that his wife was very concerned when she identified that he had slipped to the floor.  They thought to call EMS but the patient's son-in-law brought the patient for evaluation.  Patient reports he feels back to normal at this point.  He is not having any chest pain.  He does say he has put on some extra fluid weight over the past few weeks.  He reports he is compliant with his Lasix.  He does note that sometimes it seems like his heart medications are working a little "too much".  He reports his heart rate will get down into the 40s at times.    Past Medical History:  Diagnosis Date   A-fib [facility] [place])    Anxiety    Atrial fibrillation (HCC)    CHF (congestive heart failure) ([place])    Claustrophobia    Occasionally when flying    Colitis    Diabetes mellitus, type 2  ([place])    Fracture of one rib, left side, initial encounter for closed fracture 12/27/2016   Occurred 12/21/16 after a fall at home.  Left anterior seventh rib   Gout    Hemorrhoids    Hyperlipidemia    Hypertension    LV dysfunction    EF 40-45%   OSA (obstructive sleep apnea)    CPAP machine    PVC's (premature ventricular contractions)    Skin cancer     Patient Active Problem List   Diagnosis Date Noted   Chronic systolic heart failure ([place]) 04/13/2020   COVID 02/20/2020   Visual hallucinations 01/11/2020   Aortic atherosclerosis ([place]) 10/20/2019   Nerve disorder 10/08/2019   Coughing 04/02/2019   Dyspnea on exertion 11/01/2018   Hemarthrosis of left knee 02/16/2018   Acute pain of right knee 12/25/2017   Fall 12/27/2016   Gout 06/03/2015   Prediabetes 03/05/2015   Venous (peripheral) insufficiency 02/16/2014   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   ATRIAL FIBRILLATION  01/26/2010   Acute on chronic systolic heart failure ([place]) 01/26/2010   Asthma 01/14/2010   Hypercholesterolemia 10/21/2007   Anxiety 10/21/2007   Essential hypertension 02/26/2006   COLONIC POLYPS, HX OF 02/26/2006    Past Surgical History:  Procedure  Laterality Date   CARDIOVASCULAR STRESS TEST  03/02/2010   EF 50%   MOHS SURGERY     RIGHT/LEFT HEART CATH AND CORONARY ANGIOGRAPHY [name] 03/18/2020   Procedure: RIGHT/LEFT HEART CATH AND CORONARY ANGIOGRAPHY;  Surgeon: Martinique, Peter M, MD;  Location: [facility];  Service: Cardiovascular;  Laterality: [name];   US ECHOCARDIOGRAPHY  11/15/2009   EF 40-45%       Family History  Problem Relation Age of Onset   Hypertension Father    Heart attack Father        Age 22 (MI)   Heart failure Father    Colonic polyp Sister        and Father   Stroke Mother    Colon cancer Neg Hx    Stomach cancer Neg Hx     Social History   Tobacco Use   Smoking status: Former    Packs/day: 1.00    Years: 16.00    Pack years: 16.00    Types:  Cigarettes    Quit date: 06/30/1977    Years since quitting: 43.4   Smokeless tobacco: Never  Vaping Use   Vaping Use: Never used  Substance Use Topics   Alcohol use: No   Drug use: No    Home Medications Prior to Admission medications   Medication Sig Start Date End Date Taking? Authorizing Provider  allopurinol (ZYLOPRIM) 300 MG tablet TAKE 1 TABLET BY MOUTH EVERY DAY TO LOWER URIC ACID FOR GOUT 11/23/20  Yes Burns, Claudina Lick, MD  amLODipine (NORVASC) 5 MG tablet Take 0.5 tablets (2.5 mg total) by mouth daily. 04/29/20  Yes Martinique, Peter M, MD  clonazePAM (KLONOPIN) 0.5 MG tablet TAKE 1/2 TO 1 TABLET(0.25 TO 0.5 MG) BY MOUTH TWICE DAILY AS NEEDED FOR ANXIETY 11/23/20  Yes Burns, Claudina Lick, MD  atorvastatin (LIPITOR) 20 MG tablet Take 1 tablet (20 mg total) by mouth daily. 04/16/20   Binnie Rail, MD  colchicine 0.6 MG tablet For gout flare take 2 pills po x 1 and 1 pill 1 hour later 10/14/20   Binnie Rail, MD  dabigatran (PRADAXA) 150 MG CAPS capsule TAKE 1 CAPSULE EVERY 12 HOURS 09/20/20   Martinique, Peter M, MD  empagliflozin (JARDIANCE) 10 MG TABS tablet Take 1 tablet (10 mg total) by mouth daily before breakfast. 07/01/20   Martinique, Peter M, MD  furosemide (LASIX) 40 MG tablet Take 40 mg twice a day 07/01/20   Martinique, Peter M, MD  LUMIGAN 0.01 % SOLN Place 1 drop into both eyes at bedtime.  03/08/17   [provider]  metoprolol succinate (TOPROL-XL) 50 MG 24 hr tablet Take 1 tablet (50 mg total) by mouth daily. Take with or immediately following a meal. 06/25/20 06/20/21  Martinique, Peter M, MD  Multiple Vitamin (MULTIVITAMIN) tablet Take 1 tablet by mouth every evening.    [provider]  PARoxetine (PAXIL) 20 MG tablet TAKE 1/2 TABLET(10 MG) BY MOUTH DAILY 10/14/20   Burns, Claudina Lick, MD  sacubitril-valsartan (ENTRESTO) 97-103 MG Take 1 tablet by mouth 2 (two) times daily. 06/25/20   Martinique, Peter M, MD  spironolactone (ALDACTONE) 25 MG tablet Take 0.5 tablets (12.5 mg total) by mouth  daily. 06/25/20 06/20/21  Martinique, Peter M, MD  traZODone (DESYREL) 50 MG tablet TAKE 1/2 TO 1 TABLET(25 TO 50 MG) BY MOUTH AT BEDTIME AS NEEDED FOR SLEEP 07/13/20   Binnie Rail, MD    Allergies    Patient has no known  allergies.  Review of Systems   Review of Systems 10 systems reviewed and negative except as per HPI Physical Exam Updated Vital Signs BP (!) 155/87   Pulse 62   Temp (!) 97.5 F (36.4 C) (Oral)   Resp 16   Ht 5\' 8"  (1.727 m)   Wt 85.5 kg   SpO2 94%   BMI 28.66 kg/m   Physical Exam Constitutional:      Comments: Alert nontoxic clinically well in appearance.  HENT:     Head: Normocephalic and atraumatic.     Mouth/Throat:     Pharynx: Oropharynx is clear.  Eyes:     Extraocular Movements: Extraocular movements intact.  Cardiovascular:     Comments: Irregularly irregular.  Rate from the mid 40s to upper 50s.  No gross rub murmur gallop. Pulmonary:     Effort: Pulmonary effort is normal.     Breath sounds: Normal breath sounds.  Abdominal:     General: There is no distension.     Palpations: Abdomen is soft.     Tenderness: There is no abdominal tenderness. There is no guarding.  Musculoskeletal:     Comments: Changes of chronic venous stasis of the skin of the lower extremities with hyperpigmentation and thinning.  1+ edema bilaterally.  Calves soft and nontender.  Skin:    General: Skin is warm and dry.  Neurological:     General: No focal deficit present.     Mental Status: He is oriented to person, place, and time.     Coordination: Coordination normal.  Psychiatric:        Mood and Affect: Mood normal.    ED Results / Procedures / Treatments   Labs (all labs ordered are listed, but only abnormal results are displayed) Labs Reviewed  CBC - Abnormal; Notable for the following components:      Result Value   MCV 107.0 (*)    RDW 18.7 (*)    All other components within normal limits  URINALYSIS, ROUTINE W REFLEX MICROSCOPIC  BASIC METABOLIC  PANEL  BRAIN NATRIURETIC PEPTIDE  PROTIME-INR  TROPONIN I (HIGH SENSITIVITY)    EKG EKG Interpretation  Date/Time:  Thursday December 16 2020 13:21:31 EST Ventricular Rate:  57 PR Interval:    QRS Duration: 138 QT Interval:  504 QTC Calculation: 490 R Axis:   -30 Text Interpretation: Atrial fibrillation with slow ventricular response with premature ventricular or aberrantly conducted complexes Left axis deviation Right bundle branch block T wave abnormality, consider inferolateral ischemia Abnormal ECG agree, no sig change from previous Confirmed by Charlesetta Shanks [phone]) on 12/16/2020 2:33:40 PM  Radiology DG Chest Portable 1 View  Result Date: 12/16/2020 CLINICAL DATA:  Shortness of breath, dizziness EXAM: PORTABLE CHEST 1 VIEW COMPARISON:  Previous studies including the examination of 04/02/2019 FINDINGS: Transverse diameter of heart is increased. There are no signs of pulmonary edema or new focal infiltrates. There is elevation of left hemidiaphragm. Left cardiophrenic angle is indistinct. There is no pneumothorax. IMPRESSION: Cardiomegaly. There are no signs of pulmonary edema or focal pulmonary consolidation. Electronically Signed   By: Elmer Picker M.D.   On: 12/16/2020 13:55    Procedures Procedures   Medications Ordered in ED Medications - No data to display  ED Course  I have reviewed the triage vital signs and the nursing notes.  Pertinent labs & imaging results that were available during my care of the patient were reviewed by me and considered in my medical decision making (see chart  for details).    MDM Rules/Calculators/A&P                            Patient describes symptoms of mild shortness of breath for several days and some element of weight gain.  Clinically suggestive of volume overload with CHF.  Patient does not have gross crackles on exam.  He is got slight edema peripherally.  At this time, based on clinical exam and available diagnostic  results, patient may have mild CHF exacerbation.  No chest pain or EKG changes at this point to suggest ACS.  No injury with the patient's fall.  I do not have suspicion for intracranial injury.  Patient shows no signs of confusion and did not hit his head. At this time plan will be to review diagnostic results and dedication's.  If no elevation in troponin or significant BNP elevation, anticipate discharge with close follow-up with cardiology.  Anticipate increasing Lasix dose for 2 days and then close follow-up.  Dr.Trifan to F/U diagnostic results for final disposition Final Clinical Impression(s) / ED Diagnoses Final diagnoses:  Fall, initial encounter    Rx / DC Orders ED Discharge Orders     None        Charlesetta Shanks, MD 12/17/20 [phone]

## 2020-12-16 NOTE — Discharge Instructions (Signed)
Please call your cardiologist schedule follow-up appointment in the office.  You have signs of a congestive heart failure flareup.  This means there may be some fluid in your lungs.  We gave you IV Lasix to remove some of the fluid in your urine.  For the next 7 days, I want you to double your morning dose of Furosemide (Lasix) from 40 mg to 80 mg.  You can continue your normal dose of 40 mg at night.  You should try to schedule an appointment with your cardiologist in the meantime.  If your breathing gets worse, or you have worsening palpitations, lightheadedness, chest pressure, please call 911 or return to the ER.

## 2020-12-19 NOTE — Progress Notes (Signed)
Cardiology Office Note:    Date:  12/21/2020   ID:  Samuel Dalton., DOB Oct 17, 1940, MRN [id number]  PCP:  Binnie Rail, MD  Cardiologist:  Teniya Filter Martinique, MD  Electrophysiologist:  None   Referring MD: Binnie Rail, MD   Chief Complaint: follow-up of CHF  History of Present Illness:    Samuel Busch. is a 80 y.o. 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 Martinique and presents today for follow-up of CHF.  Patient was seen by Dr. Martinique on 02/05/2018 at which time her reported dyspnea on exertion (such as walking up a hill or carrying something) and weight gain.He did admit to eating out a lot and eating a lot of processed meats. Lasix was increased to 40mg  twice daily. BNP was ordered and came back elevated at 835. Echo was ordered and showed LVEF of 45-50% with hypokinesis of basal-mid inferior and inferolateral walls, severe biatrial enlargement, mild to moderate MR, and moderate TR. RV also moderately enlarged with moderately reduced systolic function and severely elevated PASP. He was seen by Dr. Martinique again on 03/15/2020 for follow-up at which time he reported increased urine output with increase in Lasix but weight and shortness of breath were unchanged. Right/left cardiac catheterization was recommended for further evaluation. This was performed on 03/18/2020 and showed mild non-obstructive CAD with moderately elevated LV filling pressures (LVEDP of 60mmHg), moderate pulmonary hypertension, and preserved cardiac output. Lasix was increased to 80mg  in the morning and 40mg  in the afternoon. Lisinopril was stopped with plan to start Entresto 49/51 twice daily after  3 day washout period.   Patient called our office on 03/22/2020 with reports of worsening shortness of breath since cardiac catheterization as well as dizziness. He stated he never started the [facility] because it was too expensive. This visit was scheduled or further evaluation. He was given a one month supply. Has been taking Entresto since then at 49/51 mg bid dose. Followed by Pharm D. Lasix reduced to 40 mg bid. On Jardiance. Toprol XL at 50 mg daily. Later Entresto dose increased to 97/103 mg bid and also on aldatone.   On his last visit we reduced his lasix to 40 mg bid. Last week he noted increased SOB, weight gain and dizziness. Weight was up 10 lbs. He was seen in the ED on 12/16/20. Felt to be volume overloaded and lasix dose increased back to 80 mg in the morning and 40 mg in the evening. He was given IV lasix x 1.  BNP was 2963. CXR clear. Troponins normal. BUN 35 with creatinine 1.58.   Seen with his daughter today. Notes his wife has not been well so has been eating out more. Take out. Feels like his breathing is better since his ED visit. Wears CPAP at night.      Past Medical History:  Diagnosis Date   A-fib [facility])    Anxiety    Atrial fibrillation (HCC)    CHF (congestive heart failure) ([place])    Claustrophobia    Occasionally when flying    Colitis    Diabetes mellitus, type 2 ([place])    Fracture of one rib, left side, initial encounter for closed fracture 12/27/2016   Occurred 12/21/16 after a  fall at home.  Left anterior seventh rib   Gout    Hemorrhoids    Hyperlipidemia    Hypertension    LV dysfunction    EF 40-45%   OSA (obstructive sleep apnea)    CPAP machine    PVC's (premature ventricular contractions)    Skin cancer     Past Surgical History:  Procedure Laterality Date   CARDIOVASCULAR STRESS TEST  03/02/2010   EF 50%   MOHS SURGERY     RIGHT/LEFT HEART CATH AND CORONARY ANGIOGRAPHY [name] 03/18/2020   Procedure: RIGHT/LEFT HEART CATH AND CORONARY  ANGIOGRAPHY;  Surgeon: Martinique, Nuno Brubacher M, MD;  Location: [facility];  Service: Cardiovascular;  Laterality: [name];   US ECHOCARDIOGRAPHY  11/15/2009   EF 40-45%    Current Medications: Current Meds  Medication Sig   allopurinol (ZYLOPRIM) 300 MG tablet TAKE 1 TABLET BY MOUTH EVERY DAY TO LOWER URIC ACID FOR GOUT   amLODipine (NORVASC) 5 MG tablet Take 0.5 tablets (2.5 mg total) by mouth daily.   atorvastatin (LIPITOR) 20 MG tablet Take 1 tablet (20 mg total) by mouth daily.   clonazePAM (KLONOPIN) 0.5 MG tablet TAKE 1/2 TO 1 TABLET(0.25 TO 0.5 MG) BY MOUTH TWICE DAILY AS NEEDED FOR ANXIETY   colchicine 0.6 MG tablet For gout flare take 2 pills po x 1 and 1 pill 1 hour later   dabigatran (PRADAXA) 150 MG CAPS capsule TAKE 1 CAPSULE EVERY 12 HOURS   empagliflozin (JARDIANCE) 10 MG TABS tablet Take 1 tablet (10 mg total) by mouth daily before breakfast.   LUMIGAN 0.01 % SOLN Place 1 drop into both eyes at bedtime.    metoprolol succinate (TOPROL-XL) 50 MG 24 hr tablet Take 1 tablet (50 mg total) by mouth daily. Take with or immediately following a meal.   Multiple Vitamin (MULTIVITAMIN) tablet Take 1 tablet by mouth every evening.   PARoxetine (PAXIL) 20 MG tablet TAKE 1/2 TABLET(10 MG) BY MOUTH DAILY   sacubitril-valsartan (ENTRESTO) 97-103 MG Take 1 tablet by mouth 2 (two) times daily.   spironolactone (ALDACTONE) 25 MG tablet Take 0.5 tablets (12.5 mg total) by mouth daily.   traZODone (DESYREL) 50 MG tablet TAKE 1/2 TO 1 TABLET(25 TO 50 MG) BY MOUTH AT BEDTIME AS NEEDED FOR SLEEP   [DISCONTINUED] furosemide (LASIX) 40 MG tablet Take 40 mg twice a day     Allergies:   Patient has no known allergies.   Social History   Socioeconomic History   Marital status: Married    Spouse name: Sunday Spillers   Number of children: 3   Years of education: Not on file   Highest education level: Bachelor's degree (e.g., BA, AB, BS)  Occupational History   Occupation: Retired    Fish farm manager: RETIRED     Comment: [employer]   Tobacco Use   Smoking status: Former    Packs/day: 1.00    Years: 16.00    Pack years: 16.00    Types: Cigarettes    Quit date: 06/30/1977    Years since quitting: 43.5   Smokeless tobacco: Never  Vaping Use   Vaping Use: Never used  Substance and Sexual Activity   Alcohol use: No   Drug use: No   Sexual activity: Not on file  Other Topics Concern   Not on file  Social History Narrative   03/23/20 lives with wife   2 caffeine drinks daily    Social Determinants of Health   Financial Resource Strain: Low Risk  Difficulty of Paying Living Expenses: Not hard at all  Food Insecurity: No Food Insecurity   Worried About [place] in the Last Year: Never true   Ran Out of Food in the Last Year: Never true  Transportation Needs: No Transportation Needs   Lack of Transportation (Medical): No   Lack of Transportation (Non-Medical): No  Physical Activity: Sufficiently Active   Days of Exercise per Week: 5 days   Minutes of Exercise per Session: 30 min  Stress: No Stress Concern Present   Feeling of Stress : Not at all  Social Connections: Socially Integrated   Frequency of Communication with Friends and Family: More than three times a week   Frequency of Social Gatherings with Friends and Family: More than three times a week   Attends Religious Services: More than 4 times per year   Active Member of [employer] or Organizations: Yes   Attends Music therapist: More than 4 times per year   Marital Status: Married     Family History: The patient's family history includes Colonic polyp in his sister; Heart attack in his father; Heart failure in his father; Hypertension in his father; Stroke in his mother. There is no history of Colon cancer or Stomach cancer.  ROS:   Please see the history of present illness.     EKGs/Labs/Other Studies Reviewed:    The following studies were reviewed today:  Echocardiogram 03/10/2020: Impressions:   1. There is basal-mid inferior and inferolateral left ventricular  hypokinesis. Left ventricular ejection fraction, by estimation, is 45 to  50%. The left ventricle has mildly decreased function. The left ventricle  demonstrates regional wall motion  abnormalities (see scoring diagram/findings for description). Left  ventricular diastolic function could not be evaluated. The average left  ventricular global longitudinal strain is -14.9 %. The global longitudinal  strain is abnormal.   2. Right ventricular systolic function is moderately reduced. The right  ventricular size is moderately enlarged. There is severely elevated  pulmonary artery systolic pressure.   3. Left atrial size was severely dilated.   4. Right atrial size was severely dilated.   5. The mitral valve is normal in structure. Mild to moderate mitral valve  regurgitation.   6. Tricuspid valve regurgitation is moderate.   7. The aortic valve is tricuspid. Aortic valve regurgitation is mild.  Mild aortic valve sclerosis is present, with no evidence of aortic valve  stenosis.   8. There is borderline dilatation of the ascending aorta, measuring 40  mm.   9. The inferior vena cava is normal in size with <50% respiratory  variability, suggesting right atrial pressure of 8 mmHg.   Comparison(s): A prior study was performed on 01/09/19. Endocardial  definition is better on the current study. Wall motion abnormalities were  probably present on the previous study. Estimated right atrial and  pulmonary artery pressure and right ventricular function all appear to have worsened on the current study.  EKG:  EKG not ordered today.  _______________  Right/Left Cardiac Catheterization 03/18/2020: Prox LAD to Mid LAD lesion is 20% stenosed. Prox Cx to Mid Cx lesion is 15% stenosed. Prox RCA lesion is 30% stenosed. LV end diastolic pressure is moderately elevated. Hemodynamic findings consistent with moderate pulmonary  hypertension.   1. Mild nonobstructive CAD 2. Moderately elevated LV filling pressures. PCWP and EDP 21 mm Hg.  3. Moderate pulmonary HTN with mean PAP 42 mm Hg 4. Preserved cardiac output. Index 2.4.   Plan: will  intensify medical therapy. Increase lasix to 80 mg in the morning and 40 mg in the afternoon. Stop lisinopril. After 3 days will begin Entresto 49/51 mg daily. Will titrate as tolerated. Consider SGLT2 inhibitor. May resume Pradaxa this evening.  Diagnostic Dominance: Right     Recent Labs: 10/14/2020: ALT 19 12/16/2020: B Natriuretic Peptide 2,962.8; BUN 35; Creatinine, Ser 1.58; Hemoglobin 15.1; Platelets 172; Potassium 4.0; Sodium 141  Recent Lipid Panel    Component Value Date/Time   CHOL 161 10/14/2020 1057   TRIG 71.0 10/14/2020 1057   HDL 66.60 10/14/2020 1057   CHOLHDL 2 10/14/2020 1057   VLDL 14.2 10/14/2020 1057   LDLCALC 81 10/14/2020 1057   LDLCALC 83 10/20/2019 1038    Physical Exam:    Vital Signs: BP 130/70   Pulse 88   Ht 5\' 8"  (1.727 m)   Wt 200 lb 3.2 oz (90.8 kg)   SpO2 96%   BMI 30.44 kg/m     Wt Readings from Last 3 Encounters:  12/21/20 200 lb 3.2 oz (90.8 kg)  12/16/20 188 lb 7.9 oz (85.5 kg)  10/29/20 188 lb 9.6 oz (85.5 kg)     General: 80 y.o. male in no acute distress. HEENT: Normocephalic and atraumatic. Sclera clear. Neck: Supple. No JVD. Heart: Irregularly irregular rhythm with normal rate. Distinct S1 and S2. No murmurs, gallops, or rubs. Radial pulses 2+ and equal bilaterally.  Lungs: No increased work of breathing. Clear to ausculation bilaterally. No wheezes, rhonchi, or rales.  Abdomen: Soft, non-distended, and non-tender to palpation.  Extremities: TR- 1+edema bilaterally. hyperpigmentation Skin: Warm and dry. Neuro:  No focal deficits. Psych: Normal affect. Responds appropriately.   Assessment:    1. Acute on chronic combined systolic and diastolic CHF (congestive heart failure) ([place])   2. Essential  hypertension   3. Permanent atrial fibrillation ([place])   4. Hyperlipidemia, unspecified hyperlipidemia type   5. Nonischemic cardiomyopathy ([place])       Plan:    1.  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 - will update Echo - R/LHC on 03/18/2020 showed mild non-obstructive CAD with moderately elevated LV filling pressures (LVEDP of 73mmHg), moderate pulmonary hypertension, and preserved cardiac80 mg in the morning and 40 mg in the evening.  - on optimal therapy with Entresto, aldactone, Toprol XL and Jardiance.  - Discussed importance of daily weights and sodium/fluid restrictions.  - follow up in 2 weeks with repeat BMET and BNP  2. Non-Obstructive CAD - Noted on recent cardiac catheterization. - No chest pain. - No aspirin due to need for Pradaxa. - Continue statin.  3. Permanent Atrial Fibrillation - Rates well controlled. - Continue beta-blocker as above.  - Continue Pradaxa.  4. Hypertension - controlled.  - Continue medications for CHF as above. Also on Amlodipine  5. Hyperlipidemia - Lipid panel from 10/2019: Total Cholesterol 150, Triglycerides 53, HDL 54, LDL 83.  - On Lipitor 10mg  daily.  6. Pre-Diabetes - Hemoglobin A1c 6.1.  - Followed by PCP.  7. Obstructive Sleep Apnea - On BiPAP at night. - Followed by Pulmonology.  8. Acute Kidney injury. Related to CHF. Creatinine up to 1.58. will repeat in 2 weeks.     Medication Adjustments/Labs and Tests Ordered: Current medicines are reviewed at length with the patient today.  Concerns regarding medicines are outlined above.  No orders of the defined types were placed in this encounter.  Meds ordered this encounter  Medications   furosemide (LASIX) 40 MG tablet    Sig: Take 80 mg in the morning and 40 mg in the evening    Dispense:  180 tablet    Refill:  3     Patient Instructions   Restrict your sodium to less than 2 grams per day.  Restrict fluid to less than 2 liters a day  Continue higher lasix dose 80 mg in the morning and 40 mg in the evening.   Call if weight increases by more than 3 lbs in a day or 5 lbs in a week  Weigh daily.  We will schedule you for an Echocardiogram    Signed, Ontario Pettengill Martinique, MD  12/21/2020 4:55 PM    [facility]

## 2020-12-21 ENCOUNTER — Encounter: Payer: Self-pay | Admitting: Cardiology

## 2020-12-21 ENCOUNTER — Other Ambulatory Visit: Payer: Self-pay

## 2020-12-21 ENCOUNTER — Ambulatory Visit (INDEPENDENT_AMBULATORY_CARE_PROVIDER_SITE_OTHER): Payer: Medicare Other | Admitting: Cardiology

## 2020-12-21 VITALS — BP 130/70 | HR 88 | Ht 68.0 in | Wt 200.2 lb

## 2020-12-21 DIAGNOSIS — E785 Hyperlipidemia, unspecified: Secondary | ICD-10-CM

## 2020-12-21 DIAGNOSIS — I4821 Permanent atrial fibrillation: Secondary | ICD-10-CM | POA: Diagnosis not present

## 2020-12-21 DIAGNOSIS — I428 Other cardiomyopathies: Secondary | ICD-10-CM

## 2020-12-21 DIAGNOSIS — I5043 Acute on chronic combined systolic (congestive) and diastolic (congestive) heart failure: Secondary | ICD-10-CM

## 2020-12-21 DIAGNOSIS — I2510 Atherosclerotic heart disease of native coronary artery without angina pectoris: Secondary | ICD-10-CM | POA: Diagnosis not present

## 2020-12-21 DIAGNOSIS — I10 Essential (primary) hypertension: Secondary | ICD-10-CM

## 2020-12-21 MED ORDER — FUROSEMIDE 40 MG PO TABS: ORAL_TABLET | ORAL | 3 refills | Status: DC

## 2020-12-21 NOTE — Patient Instructions (Signed)
Restrict your sodium to less than 2 grams per day.  Restrict fluid to less than 2 liters a day  Continue higher lasix dose 80 mg in the morning and 40 mg in the evening.   Call if weight increases by more than 3 lbs in a day or 5 lbs in a week  Weigh daily.  We will schedule you for an Echocardiogram

## 2021-01-04 ENCOUNTER — Ambulatory Visit: Payer: TRICARE For Life (TFL) | Admitting: Cardiology

## 2021-01-06 ENCOUNTER — Other Ambulatory Visit: Payer: Self-pay

## 2021-01-06 ENCOUNTER — Ambulatory Visit (INDEPENDENT_AMBULATORY_CARE_PROVIDER_SITE_OTHER): Payer: Medicare Other

## 2021-01-06 DIAGNOSIS — I5043 Acute on chronic combined systolic (congestive) and diastolic (congestive) heart failure: Secondary | ICD-10-CM

## 2021-01-06 DIAGNOSIS — I10 Essential (primary) hypertension: Secondary | ICD-10-CM

## 2021-01-06 DIAGNOSIS — I428 Other cardiomyopathies: Secondary | ICD-10-CM | POA: Diagnosis not present

## 2021-01-06 DIAGNOSIS — E785 Hyperlipidemia, unspecified: Secondary | ICD-10-CM

## 2021-01-06 DIAGNOSIS — I4821 Permanent atrial fibrillation: Secondary | ICD-10-CM | POA: Diagnosis not present

## 2021-01-06 LAB — ECHOCARDIOGRAM COMPLETE
AR max vel: 2.85 cm2
AV Area VTI: 2.97 cm2
AV Area mean vel: 2.85 cm2
AV Mean grad: 4 mmHg
AV Peak grad: 8.1 mmHg
AV Vena cont: 0.28 cm
Ao pk vel: 1.42 m/s
Area-P 1/2: 3.77 cm2
MV M vel: 4.75 m/s
MV Peak grad: 90.3 mmHg
P 1/2 time: 626 msec
S' Lateral: 4.43 cm
Single Plane A4C EF: 49.3 %

## 2021-01-09 NOTE — Progress Notes (Signed)
Cardiology Office Note:    Date:  01/11/2021   ID:  Samuel Moyer., DOB July 26, 1940, MRN [id number]  PCP:  Binnie Rail, MD  Cardiologist:  Tateanna Bach Martinique, MD  Electrophysiologist:  None   Referring MD: Binnie Rail, MD   Chief Complaint: follow-up of CHF  History of Present Illness:    Samuel Moyer. is a 80 y.o. 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  Patient was seen by Dr. Martinique on 02/05/2018 at which time her reported dyspnea on exertion (such as walking up a hill or carrying something) and weight gain.He did admit to eating out a lot and eating a lot of processed meats. Lasix was increased to 96m twice daily. BNP was ordered and came back elevated at 835. Echo was ordered and showed LVEF of 45-50% with hypokinesis of basal-mid inferior and inferolateral walls, severe biatrial enlargement, mild to moderate MR, and moderate TR. RV also moderately enlarged with moderately reduced systolic function and severely elevated PASP. He was seen by Dr. JMartiniqueagain on 03/15/2020 for follow-up at which time he reported increased urine output with increase in Lasix but weight and shortness of breath were unchanged. Right/left cardiac catheterization was recommended for further evaluation. This was performed on 03/18/2020 and showed mild non-obstructive CAD with moderately elevated LV filling pressures (LVEDP of 244mg), moderate pulmonary hypertension, and preserved cardiac output. Lasix was increased to 8055mn the morning and 35m10m the afternoon. Lisinopril was stopped with plan to start Entresto 49/51 twice daily after 3 day washout period.   Patient called our office  on 03/22/2020 with reports of worsening shortness of breath since cardiac catheterization as well as dizziness. [facility] This visit was scheduled or further evaluation. He was given a one month supply. Has been taking Entresto since then at 49/51 mg bid dose. Followed by Pharm D. Lasix reduced to 40 mg bid. On Jardiance. Toprol XL at 50 mg daily. Later Entresto dose increased to 97/103 mg bid and also on aldatone.   On his last visit we reduced his lasix to 40 mg bid. Last week he noted increased SOB, weight gain and dizziness. Weight was up 10 lbs. He was seen in the ED on 12/16/20. Felt to be volume overloaded and lasix dose increased back to 80 mg in the morning and 40 mg in the evening. He was given IV lasix x 1.  BNP was 2963. CXR clear. Troponins normal. BUN 35 with creatinine 1.58. Repeat Echo showed no change in EF 45% with moderate RV dysfunction.  When seen on Nov 22 we increased lasix to 80 mg in am and 40 in the pm. Notes his wife has not been well so has been eating out more. He has since lost 8 lbs. Notes improvement in his breathing and swelling. Feels well overall except a runny nose.      Past Medical History:  Diagnosis Date   [facility]    Atrial fibrillation (HCC)    CHF (congestive heart failure) (HCC)[place] Claustrophobia    Occasionally when flying    Colitis    Diabetes mellitus, type 2 (HCC)[place]  Fracture of one rib, left side, initial encounter for closed fracture 12/27/2016   Occurred 12/21/16 after a fall at home.  Left anterior seventh rib   Gout    Hemorrhoids    Hyperlipidemia    Hypertension    LV dysfunction    EF 40-45%   OSA (obstructive sleep apnea)    CPAP machine    PVC's (premature ventricular contractions)    Skin cancer     Past Surgical History:  Procedure Laterality Date   CARDIOVASCULAR STRESS TEST  03/02/2010   EF 50%   MOHS SURGERY     RIGHT/LEFT HEART CATH AND CORONARY  ANGIOGRAPHY [name] 03/18/2020   Procedure: RIGHT/LEFT HEART CATH AND CORONARY ANGIOGRAPHY;  Surgeon: Martinique, Aldrin Engelhard M, MD;  Location: [facility];  Service: Cardiovascular;  Laterality: [name];   US ECHOCARDIOGRAPHY  11/15/2009   EF 40-45%    Current Medications: Current Meds  Medication Sig   allopurinol (ZYLOPRIM) 300 MG tablet TAKE 1 TABLET BY MOUTH EVERY DAY TO LOWER URIC ACID FOR GOUT   amLODipine (NORVASC) 5 MG tablet Take 0.5 tablets (2.5 mg total) by mouth daily.   atorvastatin (LIPITOR) 20 MG tablet Take 1 tablet (20 mg total) by mouth daily.   clonazePAM (KLONOPIN) 0.5 MG tablet TAKE 1/2 TO 1 TABLET(0.25 TO 0.5 MG) BY MOUTH TWICE DAILY AS NEEDED FOR ANXIETY   colchicine 0.6 MG tablet For gout flare take 2 pills po x 1 and 1 pill 1 hour later   dabigatran (PRADAXA) 150 MG CAPS capsule TAKE 1 CAPSULE EVERY 12 HOURS   empagliflozin (JARDIANCE) 10 MG TABS tablet Take 1 tablet (10 mg total) by mouth daily before breakfast.   furosemide (LASIX) 40 MG tablet Take 80 mg in the morning and 40 mg in the evening   LUMIGAN 0.01 % SOLN Place 1 drop into both eyes at bedtime.    metoprolol succinate (TOPROL-XL) 50 MG 24 hr tablet Take 1 tablet (50 mg total) by mouth daily. Take with or immediately following a meal.   Multiple Vitamin (MULTIVITAMIN) tablet Take 1 tablet by mouth every evening.   PARoxetine (PAXIL) 20 MG tablet TAKE 1/2 TABLET(10 MG) BY MOUTH DAILY   sacubitril-valsartan (ENTRESTO) 97-103 MG Take 1 tablet by mouth 2 (two) times daily.   spironolactone (ALDACTONE) 25 MG tablet Take 0.5 tablets (12.5 mg total) by mouth daily.   traZODone (DESYREL) 50 MG tablet TAKE 1/2 TO 1 TABLET(25 TO 50 MG) BY MOUTH AT BEDTIME AS NEEDED FOR SLEEP     Allergies:   Patient has no known allergies.   Social History   Socioeconomic History   Marital status: Married    Spouse name: Samuel Moyer   Number of children: 3   Years of education: Not on file   Highest education level: Bachelor's degree  (e.g., BA, AB, BS)  Occupational History   Occupation: Retired    Fish farm manager: RETIRED    Comment: [employer]   Tobacco Use   Smoking status: Former    Packs/day: 1.00    Years: 16.00    Pack years: 16.00    Types: Cigarettes    Quit date: 06/30/1977    Years since quitting: 43.5   Smokeless tobacco: Never  Vaping Use   Vaping Use: Never used  Substance and Sexual Activity   Alcohol use: No   Drug use: No   Sexual activity: Not on file  Other Topics Concern   Not on file  Social History Narrative   03/23/20 lives  with wife   2 caffeine drinks daily    Social Determinants of Health   Financial Resource Strain: Low Risk    Difficulty of Paying Living Expenses: Not hard at all  Food Insecurity: No Food Insecurity   Worried About Charity fundraiser in the Last Year: Never true   Arboriculturist in the Last Year: Never true  Transportation Needs: No Transportation Needs   Lack of Transportation (Medical): No   Lack of Transportation (Non-Medical): No  Physical Activity: Sufficiently Active   Days of Exercise per Week: 5 days   Minutes of Exercise per Session: 30 min  Stress: No Stress Concern Present   Feeling of Stress : Not at all  Social Connections: Socially Integrated   Frequency of Communication with Friends and Family: More than three times a week   Frequency of Social Gatherings with Friends and Family: More than three times a week   Attends Religious Services: More than 4 times per year   Active Member of [employer] or Organizations: Yes   Attends Music therapist: More than 4 times per year   Marital Status: Married     Family History: The patient's family history includes Colonic polyp in his sister; Heart attack in his father; Heart failure in his father; Hypertension in his father; Stroke in his mother. There is no history of Colon cancer or Stomach cancer.  ROS:   Please see the history of present illness.     EKGs/Labs/Other Studies Reviewed:     The following studies were reviewed today:  Echocardiogram 03/10/2020: Impressions:  1. There is basal-mid inferior and inferolateral left ventricular  hypokinesis. Left ventricular ejection fraction, by estimation, is 45 to  50%. The left ventricle has mildly decreased function. The left ventricle  demonstrates regional wall motion  abnormalities (see scoring diagram/findings for description). Left  ventricular diastolic function could not be evaluated. The average left  ventricular global longitudinal strain is -14.9 %. The global longitudinal  strain is abnormal.   2. Right ventricular systolic function is moderately reduced. The right  ventricular size is moderately enlarged. There is severely elevated  pulmonary artery systolic pressure.   3. Left atrial size was severely dilated.   4. Right atrial size was severely dilated.   5. The mitral valve is normal in structure. Mild to moderate mitral valve  regurgitation.   6. Tricuspid valve regurgitation is moderate.   7. The aortic valve is tricuspid. Aortic valve regurgitation is mild.  Mild aortic valve sclerosis is present, with no evidence of aortic valve  stenosis.   8. There is borderline dilatation of the ascending aorta, measuring 40  mm.   9. The inferior vena cava is normal in size with <50% respiratory  variability, suggesting right atrial pressure of 8 mmHg.   Comparison(s): A prior study was performed on 01/09/19. Endocardial  definition is better on the current study. Wall motion abnormalities were  probably present on the previous study. Estimated right atrial and  pulmonary artery pressure and right ventricular function all appear to have worsened on the current study.  EKG:  EKG not ordered today.  _______________  Right/Left Cardiac Catheterization 03/18/2020: Prox LAD to Mid LAD lesion is 20% stenosed. Prox Cx to Mid Cx lesion is 15% stenosed. Prox RCA lesion is 30% stenosed. LV end diastolic pressure is  moderately elevated. Hemodynamic findings consistent with moderate pulmonary hypertension.   1. Mild nonobstructive CAD 2. Moderately elevated LV filling pressures. PCWP and  EDP 21 mm Hg.  3. Moderate pulmonary HTN with mean PAP 42 mm Hg 4. Preserved cardiac output. Index 2.4.   Plan: will intensify medical therapy. Increase lasix to 80 mg in the morning and 40 mg in the afternoon. Stop lisinopril. After 3 days will begin Entresto 49/51 mg daily. Will titrate as tolerated. Consider SGLT2 inhibitor. May resume Pradaxa this evening.  Diagnostic Dominance: Right   Echo 01/06/21: IMPRESSIONS     1. Diffuse hypokinesis wore in inferior base EF similar to that seen on  TTE 03/10/20. Left ventricular ejection fraction, by estimation, is 40 to  45%. The left ventricle has mildly decreased function. The left ventricle  has no regional wall motion  abnormalities. The left ventricular internal cavity size was mildly  dilated. Left ventricular diastolic parameters were normal.   2. Right ventricular systolic function is moderately reduced. The right  ventricular size is moderately enlarged.   3. Left atrial size was severely dilated.   4. Right atrial size was severely dilated.   5. The mitral valve is abnormal. Mild mitral valve regurgitation. No  evidence of mitral stenosis.   6. Tricuspid valve regurgitation is moderate.   7. The aortic valve is tricuspid. Aortic valve regurgitation is mild. No  aortic stenosis is present.   8. The inferior vena cava is dilated in size with >50% respiratory  variability, suggesting right atrial pressure of 8 mmHg.   Comparison(s): EF 45%, basal-mid inferior and inferolateral left  ventricular  hypokinesis, ascending aorta 73m, mild AI, mild MAC.   Recent Labs: 10/14/2020: ALT 19 12/16/2020: B Natriuretic Peptide 2,962.8; BUN 35; Creatinine, Ser 1.58; Hemoglobin 15.1; Platelets 172; Potassium 4.0; Sodium 141  Recent Lipid Panel    Component Value  Date/Time   CHOL 161 10/14/2020 1057   TRIG 71.0 10/14/2020 1057   HDL 66.60 10/14/2020 1057   CHOLHDL 2 10/14/2020 1057   VLDL 14.2 10/14/2020 1057   LDLCALC 81 10/14/2020 1057   LDLCALC 83 10/20/2019 1038    Physical Exam:    Vital Signs: BP 132/68 (BP Location: Left Arm, Patient Position: Sitting, Cuff Size: Normal)   Pulse (!) 48   Resp 20   Ht _0  (1.702 m)   Wt 192 lb 9.6 oz (87.4 kg)   SpO2 100%   BMI 30.17 kg/m     Wt Readings from Last 3 Encounters:  01/11/21 192 lb 9.6 oz (87.4 kg)  12/21/20 200 lb 3.2 oz (90.8 kg)  12/16/20 188 lb 7.9 oz (85.5 kg)     General: 80y.o. male in no acute distress. HEENT: Normocephalic and atraumatic. Sclera clear. Neck: Supple. No JVD. Heart: Irregularly irregular rhythm with normal rate. Distinct S1 and S2. No murmurs, gallops, or rubs. Radial pulses 2+ and equal bilaterally.  Lungs: No increased work of breathing. Clear to ausculation bilaterally. No wheezes, rhonchi, or rales.  Abdomen: Soft, non-distended, and non-tender to palpation.  Extremities: TR-edema bilaterally. hyperpigmentation Skin: Warm and dry. Neuro:  No focal deficits. Psych: Normal affect. Responds appropriately.   Assessment:    1. Acute on chronic combined systolic and diastolic CHF (congestive heart failure) ([place]   2. Essential hypertension   3. Nonischemic cardiomyopathy ([place]   4. Permanent atrial fibrillation ([place]        Plan:    1.  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 - recent Echo really unchanged with EF 45%.  -  R/LHC on 03/18/2020 showed mild non-obstructive CAD with moderately elevated LV filling pressures (LVEDP of 17mHg), moderate pulmonary hypertension, and preserved cardiac output. Continue  80 mg in the morning and 40 mg in the evening.  - on optimal therapy with Entresto, aldactone, Toprol XL and Jardiance.  -  Discussed importance of daily weights and sodium/fluid restrictions.  - will check BMET and BNP today.  2. Non-Obstructive CAD - Noted on recent cardiac catheterization. - No chest pain. - No aspirin due to need for Pradaxa. - Continue statin.  3. Permanent Atrial Fibrillation - Rates well controlled. - Continue beta-blocker as above.  - Continue Pradaxa.  4. Hypertension - controlled.  - Continue medications for CHF as above. Also on Amlodipine  5. Hyperlipidemia - Lipid panel from 10/2019: Total Cholesterol 150, Triglycerides 53, HDL 54, LDL 83.  - On Lipitor 169mdaily.  6. Pre-Diabetes - Hemoglobin A1c 6.1.  - Followed by PCP.  7. Obstructive Sleep Apnea - On BiPAP at night. - Followed by Pulmonology.  8. Acute Kidney injury. Will update lab work today.   Follow up in 3 months.   Medication Adjustments/Labs and Tests Ordered: Current medicines are reviewed at length with the patient today.  Concerns regarding medicines are outlined above.  No orders of the defined types were placed in this encounter.  No orders of the defined types were placed in this encounter.    There are no Patient Instructions on file for this visit.   Signed, Cedricka Sackrider JoMartiniqueMD  01/11/2021 11:20 AM    [place]

## 2021-01-11 ENCOUNTER — Other Ambulatory Visit: Payer: Self-pay

## 2021-01-11 ENCOUNTER — Ambulatory Visit (INDEPENDENT_AMBULATORY_CARE_PROVIDER_SITE_OTHER): Payer: Medicare Other | Admitting: Cardiology

## 2021-01-11 ENCOUNTER — Encounter: Payer: Self-pay | Admitting: Cardiology

## 2021-01-11 VITALS — BP 132/68 | HR 48 | Resp 20 | Ht 67.0 in | Wt 192.6 lb

## 2021-01-11 DIAGNOSIS — I2510 Atherosclerotic heart disease of native coronary artery without angina pectoris: Secondary | ICD-10-CM

## 2021-01-11 DIAGNOSIS — I5043 Acute on chronic combined systolic (congestive) and diastolic (congestive) heart failure: Secondary | ICD-10-CM

## 2021-01-11 DIAGNOSIS — I10 Essential (primary) hypertension: Secondary | ICD-10-CM

## 2021-01-11 DIAGNOSIS — I428 Other cardiomyopathies: Secondary | ICD-10-CM

## 2021-01-11 DIAGNOSIS — I4821 Permanent atrial fibrillation: Secondary | ICD-10-CM | POA: Diagnosis not present

## 2021-01-11 NOTE — Patient Instructions (Addendum)
Medication Instructions:  Continue same medications *If you need a refill on your cardiac medications before your next appointment, please call your pharmacy*   Lab Work: Bmet and BNP today  Testing/Procedures: None ordered   Follow-Up: At [facility], you and your health needs are our priority.  As part of our continuing mission to provide you with exceptional heart care, we have created designated Provider Care Teams.  These Care Teams include your primary Cardiologist (physician) and Advanced Practice Providers (APPs -  Physician Assistants and Nurse Practitioners) who all work together to provide you with the care you need, when you need it.  We recommend signing up for the patient portal called "MyChart".  Sign up information is provided on this After Visit Summary.  MyChart is used to connect with patients for Virtual Visits (Telemedicine).  Patients are able to view lab/test results, encounter notes, upcoming appointments, etc.  Non-urgent messages can be sent to your provider as well.   To learn more about what you can do with MyChart, go to [email or web address].      Your next appointment:  3 months Keep appointment Monday 04/11/21 at 1:20 pm    The format for your next appointment: Office    Provider: Dr.Jordan

## 2021-01-19 LAB — BASIC METABOLIC PANEL
BUN/Creatinine Ratio: 20 (ref 10–24)
BUN: 24 mg/dL (ref 8–27)
CO2: 25 mmol/L (ref 20–29)
Calcium: 9 mg/dL (ref 8.6–10.2)
Chloride: 103 mmol/L (ref 96–106)
Creatinine, Ser: 1.2 mg/dL (ref 0.76–1.27)
Glucose: 79 mg/dL (ref 70–99)
Potassium: 4 mmol/L (ref 3.5–5.2)
Sodium: 144 mmol/L (ref 134–144)
eGFR: 61 mL/min/{1.73_m2} (ref >59)

## 2021-01-19 LAB — BRAIN NATRIURETIC PEPTIDE: BNP: 2647 pg/mL — ABNORMAL HIGH (ref 0.0–100.0)

## 2021-01-25 ENCOUNTER — Encounter: Payer: Self-pay | Admitting: Internal Medicine

## 2021-01-26 ENCOUNTER — Other Ambulatory Visit: Payer: Self-pay | Admitting: Internal Medicine

## 2021-01-26 ENCOUNTER — Other Ambulatory Visit: Payer: Self-pay

## 2021-02-12 DIAGNOSIS — Z23 Encounter for immunization: Secondary | ICD-10-CM | POA: Diagnosis not present

## 2021-02-15 ENCOUNTER — Encounter: Payer: Self-pay | Admitting: Cardiology

## 2021-02-22 ENCOUNTER — Encounter: Payer: Self-pay | Admitting: Internal Medicine

## 2021-02-22 DIAGNOSIS — R238 Other skin changes: Secondary | ICD-10-CM

## 2021-02-22 DIAGNOSIS — H401131 Primary open-angle glaucoma, bilateral, mild stage: Secondary | ICD-10-CM | POA: Diagnosis not present

## 2021-02-22 DIAGNOSIS — R5381 Other malaise: Secondary | ICD-10-CM

## 2021-02-22 DIAGNOSIS — F0390 Unspecified dementia without behavioral disturbance: Secondary | ICD-10-CM

## 2021-02-22 DIAGNOSIS — R2689 Other abnormalities of gait and mobility: Secondary | ICD-10-CM

## 2021-02-23 ENCOUNTER — Ambulatory Visit (INDEPENDENT_AMBULATORY_CARE_PROVIDER_SITE_OTHER): Payer: Medicare Other

## 2021-02-23 ENCOUNTER — Ambulatory Visit (INDEPENDENT_AMBULATORY_CARE_PROVIDER_SITE_OTHER): Payer: Medicare Other | Admitting: Internal Medicine

## 2021-02-23 ENCOUNTER — Other Ambulatory Visit: Payer: Self-pay

## 2021-02-23 ENCOUNTER — Encounter: Payer: Self-pay | Admitting: Internal Medicine

## 2021-02-23 VITALS — BP 130/68 | HR 73 | Temp 98.3°F | Ht 67.0 in | Wt 217.2 lb

## 2021-02-23 DIAGNOSIS — G4700 Insomnia, unspecified: Secondary | ICD-10-CM | POA: Insufficient documentation

## 2021-02-23 DIAGNOSIS — J811 Chronic pulmonary edema: Secondary | ICD-10-CM | POA: Diagnosis not present

## 2021-02-23 DIAGNOSIS — I8312 Varicose veins of left lower extremity with inflammation: Secondary | ICD-10-CM | POA: Diagnosis not present

## 2021-02-23 DIAGNOSIS — J90 Pleural effusion, not elsewhere classified: Secondary | ICD-10-CM | POA: Diagnosis not present

## 2021-02-23 DIAGNOSIS — R0609 Other forms of dyspnea: Secondary | ICD-10-CM

## 2021-02-23 DIAGNOSIS — R238 Other skin changes: Secondary | ICD-10-CM | POA: Diagnosis not present

## 2021-02-23 DIAGNOSIS — I10 Essential (primary) hypertension: Secondary | ICD-10-CM

## 2021-02-23 DIAGNOSIS — L0889 Other specified local infections of the skin and subcutaneous tissue: Secondary | ICD-10-CM | POA: Diagnosis not present

## 2021-02-23 DIAGNOSIS — R7303 Prediabetes: Secondary | ICD-10-CM

## 2021-02-23 DIAGNOSIS — F0390 Unspecified dementia without behavioral disturbance: Secondary | ICD-10-CM

## 2021-02-23 DIAGNOSIS — L821 Other seborrheic keratosis: Secondary | ICD-10-CM | POA: Diagnosis not present

## 2021-02-23 DIAGNOSIS — D329 Benign neoplasm of meninges, unspecified: Secondary | ICD-10-CM | POA: Diagnosis not present

## 2021-02-23 DIAGNOSIS — I8311 Varicose veins of right lower extremity with inflammation: Secondary | ICD-10-CM | POA: Diagnosis not present

## 2021-02-23 DIAGNOSIS — I872 Venous insufficiency (chronic) (peripheral): Secondary | ICD-10-CM | POA: Diagnosis not present

## 2021-02-23 DIAGNOSIS — R0600 Dyspnea, unspecified: Secondary | ICD-10-CM | POA: Diagnosis not present

## 2021-02-23 DIAGNOSIS — I5022 Chronic systolic (congestive) heart failure: Secondary | ICD-10-CM | POA: Diagnosis not present

## 2021-02-23 DIAGNOSIS — L03116 Cellulitis of left lower limb: Secondary | ICD-10-CM | POA: Diagnosis not present

## 2021-02-23 DIAGNOSIS — D225 Melanocytic nevi of trunk: Secondary | ICD-10-CM | POA: Diagnosis not present

## 2021-02-23 DIAGNOSIS — Z85828 Personal history of other malignant neoplasm of skin: Secondary | ICD-10-CM | POA: Diagnosis not present

## 2021-02-23 DIAGNOSIS — G4709 Other insomnia: Secondary | ICD-10-CM | POA: Diagnosis not present

## 2021-02-23 LAB — VITAMIN B12: Vitamin B-12: 599 pg/mL (ref 211–911)

## 2021-02-23 LAB — CBC WITH DIFFERENTIAL/PLATELET
Basophils Absolute: 0.1 10*3/uL (ref 0.0–0.1)
Basophils Relative: 1.2 % (ref 0.0–3.0)
Eosinophils Absolute: 0.1 10*3/uL (ref 0.0–0.7)
Eosinophils Relative: 1 % (ref 0.0–5.0)
HCT: 41.7 % (ref 39.0–52.0)
Hemoglobin: 13.5 g/dL (ref 13.0–17.0)
Lymphocytes Relative: 15.2 % (ref 12.0–46.0)
Lymphs Abs: 0.9 10*3/uL (ref 0.7–4.0)
MCHC: 32.3 g/dL (ref 30.0–36.0)
MCV: 102.7 fl — ABNORMAL HIGH (ref 78.0–100.0)
Monocytes Absolute: 0.6 10*3/uL (ref 0.1–1.0)
Monocytes Relative: 9.7 % (ref 3.0–12.0)
Neutro Abs: 4.2 10*3/uL (ref 1.4–7.7)
Neutrophils Relative %: 72.9 % (ref 43.0–77.0)
Platelets: 145 10*3/uL — ABNORMAL LOW (ref 150.0–400.0)
RBC: 4.06 Mil/uL — ABNORMAL LOW (ref 4.22–5.81)
RDW: 18.6 % — ABNORMAL HIGH (ref 11.5–15.5)
WBC: 5.8 10*3/uL (ref 4.0–10.5)

## 2021-02-23 LAB — COMPREHENSIVE METABOLIC PANEL
ALT: 18 U/L (ref 0–53)
AST: 28 U/L (ref 0–37)
Albumin: 3.9 g/dL (ref 3.5–5.2)
Alkaline Phosphatase: 92 U/L (ref 39–117)
BUN: 34 mg/dL — ABNORMAL HIGH (ref 6–23)
CO2: 28 mEq/L (ref 19–32)
Calcium: 9.2 mg/dL (ref 8.4–10.5)
Chloride: 106 mEq/L (ref 96–112)
Creatinine, Ser: 1.73 mg/dL — ABNORMAL HIGH (ref 0.40–1.50)
GFR: 36.57 mL/min — ABNORMAL LOW (ref >60.00)
Glucose, Bld: 106 mg/dL — ABNORMAL HIGH (ref 70–99)
Potassium: 3.8 mEq/L (ref 3.5–5.1)
Sodium: 143 mEq/L (ref 135–145)
Total Bilirubin: 1.6 mg/dL — ABNORMAL HIGH (ref 0.2–1.2)
Total Protein: 6.5 g/dL (ref 6.0–8.3)

## 2021-02-23 LAB — TSH: TSH: 3.09 u[IU]/mL (ref 0.35–5.50)

## 2021-02-23 LAB — BRAIN NATRIURETIC PEPTIDE: Pro B Natriuretic peptide (BNP): 4543 pg/mL — ABNORMAL HIGH (ref 0.0–100.0)

## 2021-02-23 MED ORDER — CLONAZEPAM 0.5 MG PO TABS: 0.2500 mg | ORAL_TABLET | Freq: Two times a day (BID) | ORAL | 0 refills | Status: AC

## 2021-02-23 NOTE — Assessment & Plan Note (Signed)
Chronic Check A1c 

## 2021-02-23 NOTE — Assessment & Plan Note (Signed)
Acute on chronic Dyspnea on exertion is worse and he does appear to be fluid overloaded-suspect acute on chronic systolic heart failure Chest x-ray, BNP, CBC, CMP Will adjust Lasix dose

## 2021-02-23 NOTE — Assessment & Plan Note (Signed)
Chronic Per his son he has been sleeping well and sleeping a lot We will discontinue trazodone Only taking half of clonazepam twice daily, but ideally would like to try to eliminate that, but will hold off until his next visit

## 2021-02-23 NOTE — Assessment & Plan Note (Signed)
Chronic Has known meningioma-last MRI was just under a year ago and they did advise a follow-up in about 1 year He has had some increased confusion, change in gait and incontinence Will get MRI for follow-up on the meningioma as well as these other symptoms
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
## 2021-02-23 NOTE — Assessment & Plan Note (Signed)
Chronic Blood pressure very good here today and well controlled Continue amlodipine 2.5 mg daily, metoprolol XL 50 mg daily, Entresto 97-103 mg twice daily, spironolactone 12.5 mg daily

## 2021-02-23 NOTE — Assessment & Plan Note (Signed)
Acute Bilateral blisters posterior lower extremities-blister on the left leg has popped on the right leg blister still intact Saw dermatology earlier today and was started on an oral antibiotic Will continue topical triple antibiotic cream Dermatology referred him to wound care center

## 2021-02-23 NOTE — Patient Instructions (Addendum)
° °  No driving.  Someone should be helping with your medications.     Blood work was ordered.   A chest xray was ordered.    Medications changes include :   stop trazodone - sleep medication     An MRI of your brain was ordered.    Someone from their office will call you to schedule an appointment.    Think about home health care.  Nursing or PT - and HHA   He should follow up with neurology.
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
## 2021-02-23 NOTE — Assessment & Plan Note (Signed)
Chronic Fluid overloaded with mild increased respiration, shortness of breath with full sentences and with exertion Check chest x-ray, BNP, CBC, CMP Will likely need to increase Lasix temporarily-we will await blood work results

## 2021-02-24 ENCOUNTER — Telehealth: Payer: Self-pay | Admitting: Internal Medicine

## 2021-02-24 LAB — HEMOGLOBIN A1C: Hgb A1c MFr Bld: 6.4 % (ref 4.6–6.5)
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
## 2021-02-24 NOTE — Telephone Encounter (Signed)
Spoke with spouse today.  Blister burst and they have been told to apply the nonstick dressing, ointment cream and keep area dry.

## 2021-02-28 ENCOUNTER — Other Ambulatory Visit: Payer: Self-pay

## 2021-02-28 ENCOUNTER — Inpatient Hospital Stay (HOSPITAL_COMMUNITY)
Admission: EM | Admit: 2021-02-28 | Discharge: 2021-03-09 | DRG: 291 | Disposition: A | Discharge status: Hospice | Payer: Medicare Other | Attending: Internal Medicine | Admitting: Internal Medicine

## 2021-02-28 ENCOUNTER — Encounter (HOSPITAL_COMMUNITY): Payer: Self-pay | Admitting: Emergency Medicine

## 2021-02-28 ENCOUNTER — Emergency Department (HOSPITAL_COMMUNITY): Payer: Medicare Other

## 2021-02-28 DIAGNOSIS — I110 Hypertensive heart disease with heart failure: Secondary | ICD-10-CM | POA: Diagnosis not present

## 2021-02-28 DIAGNOSIS — I509 Heart failure, unspecified: Secondary | ICD-10-CM

## 2021-02-28 DIAGNOSIS — Z85828 Personal history of other malignant neoplasm of skin: Secondary | ICD-10-CM

## 2021-02-28 DIAGNOSIS — N1832 Chronic kidney disease, stage 3b: Secondary | ICD-10-CM | POA: Diagnosis not present

## 2021-02-28 DIAGNOSIS — G4733 Obstructive sleep apnea (adult) (pediatric): Secondary | ICD-10-CM | POA: Diagnosis present

## 2021-02-28 DIAGNOSIS — Z7189 Other specified counseling: Secondary | ICD-10-CM | POA: Diagnosis not present

## 2021-02-28 DIAGNOSIS — Z515 Encounter for palliative care: Secondary | ICD-10-CM

## 2021-02-28 DIAGNOSIS — M1A0790 Idiopathic chronic gout, unspecified ankle and foot, without tophus (tophi): Secondary | ICD-10-CM | POA: Diagnosis not present

## 2021-02-28 DIAGNOSIS — E1122 Type 2 diabetes mellitus with diabetic chronic kidney disease: Secondary | ICD-10-CM | POA: Diagnosis present

## 2021-02-28 DIAGNOSIS — I5043 Acute on chronic combined systolic (congestive) and diastolic (congestive) heart failure: Secondary | ICD-10-CM | POA: Diagnosis present

## 2021-02-28 DIAGNOSIS — Z66 Do not resuscitate: Secondary | ICD-10-CM | POA: Diagnosis not present

## 2021-02-28 DIAGNOSIS — N1831 Chronic kidney disease, stage 3a: Secondary | ICD-10-CM | POA: Diagnosis present

## 2021-02-28 DIAGNOSIS — I2729 Other secondary pulmonary hypertension: Secondary | ICD-10-CM | POA: Diagnosis present

## 2021-02-28 DIAGNOSIS — F03918 Unspecified dementia, unspecified severity, with other behavioral disturbance: Secondary | ICD-10-CM | POA: Diagnosis present

## 2021-02-28 DIAGNOSIS — I071 Rheumatic tricuspid insufficiency: Secondary | ICD-10-CM | POA: Diagnosis present

## 2021-02-28 DIAGNOSIS — I5023 Acute on chronic systolic (congestive) heart failure: Secondary | ICD-10-CM | POA: Diagnosis not present

## 2021-02-28 DIAGNOSIS — I5082 Biventricular heart failure: Secondary | ICD-10-CM | POA: Diagnosis not present

## 2021-02-28 DIAGNOSIS — E872 Acidosis, unspecified: Secondary | ICD-10-CM | POA: Diagnosis present

## 2021-02-28 DIAGNOSIS — R0902 Hypoxemia: Secondary | ICD-10-CM | POA: Diagnosis not present

## 2021-02-28 DIAGNOSIS — Z7901 Long term (current) use of anticoagulants: Secondary | ICD-10-CM | POA: Diagnosis not present

## 2021-02-28 DIAGNOSIS — J45909 Unspecified asthma, uncomplicated: Secondary | ICD-10-CM | POA: Diagnosis not present

## 2021-02-28 DIAGNOSIS — Z79899 Other long term (current) drug therapy: Secondary | ICD-10-CM

## 2021-02-28 DIAGNOSIS — J9601 Acute respiratory failure with hypoxia: Secondary | ICD-10-CM | POA: Diagnosis not present

## 2021-02-28 DIAGNOSIS — N179 Acute kidney failure, unspecified: Secondary | ICD-10-CM | POA: Diagnosis not present

## 2021-02-28 DIAGNOSIS — I10 Essential (primary) hypertension: Secondary | ICD-10-CM

## 2021-02-28 DIAGNOSIS — G4731 Primary central sleep apnea: Secondary | ICD-10-CM | POA: Diagnosis not present

## 2021-02-28 DIAGNOSIS — I130 Hypertensive heart and chronic kidney disease with heart failure and stage 1 through stage 4 chronic kidney disease, or unspecified chronic kidney disease: Secondary | ICD-10-CM | POA: Diagnosis not present

## 2021-02-28 DIAGNOSIS — Z789 Other specified health status: Secondary | ICD-10-CM | POA: Diagnosis not present

## 2021-02-28 DIAGNOSIS — D696 Thrombocytopenia, unspecified: Secondary | ICD-10-CM | POA: Diagnosis present

## 2021-02-28 DIAGNOSIS — I517 Cardiomegaly: Secondary | ICD-10-CM | POA: Diagnosis not present

## 2021-02-28 DIAGNOSIS — F0390 Unspecified dementia without behavioral disturbance: Secondary | ICD-10-CM | POA: Diagnosis not present

## 2021-02-28 DIAGNOSIS — Z7401 Bed confinement status: Secondary | ICD-10-CM | POA: Diagnosis not present

## 2021-02-28 DIAGNOSIS — I872 Venous insufficiency (chronic) (peripheral): Secondary | ICD-10-CM | POA: Diagnosis not present

## 2021-02-28 DIAGNOSIS — Z87891 Personal history of nicotine dependence: Secondary | ICD-10-CM

## 2021-02-28 DIAGNOSIS — H409 Unspecified glaucoma: Secondary | ICD-10-CM | POA: Diagnosis not present

## 2021-02-28 DIAGNOSIS — R531 Weakness: Secondary | ICD-10-CM

## 2021-02-28 DIAGNOSIS — Z823 Family history of stroke: Secondary | ICD-10-CM

## 2021-02-28 DIAGNOSIS — Z20822 Contact with and (suspected) exposure to covid-19: Secondary | ICD-10-CM | POA: Diagnosis present

## 2021-02-28 DIAGNOSIS — Z8249 Family history of ischemic heart disease and other diseases of the circulatory system: Secondary | ICD-10-CM | POA: Diagnosis not present

## 2021-02-28 DIAGNOSIS — M109 Gout, unspecified: Secondary | ICD-10-CM | POA: Diagnosis present

## 2021-02-28 DIAGNOSIS — I4821 Permanent atrial fibrillation: Secondary | ICD-10-CM | POA: Diagnosis present

## 2021-02-28 DIAGNOSIS — F0393 Unspecified dementia, unspecified severity, with mood disturbance: Secondary | ICD-10-CM | POA: Diagnosis not present

## 2021-02-28 DIAGNOSIS — F419 Anxiety disorder, unspecified: Secondary | ICD-10-CM | POA: Diagnosis present

## 2021-02-28 DIAGNOSIS — R32 Unspecified urinary incontinence: Secondary | ICD-10-CM | POA: Diagnosis present

## 2021-02-28 DIAGNOSIS — F05 Delirium due to known physiological condition: Secondary | ICD-10-CM | POA: Diagnosis present

## 2021-02-28 DIAGNOSIS — E785 Hyperlipidemia, unspecified: Secondary | ICD-10-CM | POA: Diagnosis not present

## 2021-02-28 DIAGNOSIS — I4891 Unspecified atrial fibrillation: Secondary | ICD-10-CM

## 2021-02-28 DIAGNOSIS — I5020 Unspecified systolic (congestive) heart failure: Secondary | ICD-10-CM | POA: Diagnosis not present

## 2021-02-28 DIAGNOSIS — R238 Other skin changes: Secondary | ICD-10-CM | POA: Diagnosis not present

## 2021-02-28 DIAGNOSIS — E876 Hypokalemia: Secondary | ICD-10-CM | POA: Diagnosis present

## 2021-02-28 DIAGNOSIS — E7800 Pure hypercholesterolemia, unspecified: Secondary | ICD-10-CM

## 2021-02-28 DIAGNOSIS — R638 Other symptoms and signs concerning food and fluid intake: Secondary | ICD-10-CM | POA: Diagnosis not present

## 2021-02-28 DIAGNOSIS — R0602 Shortness of breath: Secondary | ICD-10-CM | POA: Diagnosis not present

## 2021-02-28 LAB — BASIC METABOLIC PANEL
Anion gap: 18 — ABNORMAL HIGH (ref 5–15)
BUN: 41 mg/dL — ABNORMAL HIGH (ref 8–23)
CO2: 17 mmol/L — ABNORMAL LOW (ref 22–32)
Calcium: 9 mg/dL (ref 8.9–10.3)
Chloride: 106 mmol/L (ref 98–111)
Creatinine, Ser: 1.98 mg/dL — ABNORMAL HIGH (ref 0.61–1.24)
GFR, Estimated: 33 mL/min — ABNORMAL LOW (ref >60)
Glucose, Bld: 107 mg/dL — ABNORMAL HIGH (ref 70–99)
Potassium: 3.9 mmol/L (ref 3.5–5.1)
Sodium: 141 mmol/L (ref 135–145)

## 2021-02-28 LAB — CBC WITH DIFFERENTIAL/PLATELET
Abs Immature Granulocytes: 0.01 10*3/uL (ref 0.00–0.07)
Basophils Absolute: 0 10*3/uL (ref 0.0–0.1)
Basophils Relative: 1 %
Eosinophils Absolute: 0.1 10*3/uL (ref 0.0–0.5)
Eosinophils Relative: 2 %
HCT: 43.4 % (ref 39.0–52.0)
Hemoglobin: 14.3 g/dL (ref 13.0–17.0)
Immature Granulocytes: 0 %
Lymphocytes Relative: 21 %
Lymphs Abs: 1.4 10*3/uL (ref 0.7–4.0)
MCH: 34.1 pg — ABNORMAL HIGH (ref 26.0–34.0)
MCHC: 32.9 g/dL (ref 30.0–36.0)
MCV: 103.6 fL — ABNORMAL HIGH (ref 80.0–100.0)
Monocytes Absolute: 0.7 10*3/uL (ref 0.1–1.0)
Monocytes Relative: 11 %
Neutro Abs: 4.6 10*3/uL (ref 1.7–7.7)
Neutrophils Relative %: 65 %
Platelets: 146 10*3/uL — ABNORMAL LOW (ref 150–400)
RBC: 4.19 MIL/uL — ABNORMAL LOW (ref 4.22–5.81)
RDW: 19 % — ABNORMAL HIGH (ref 11.5–15.5)
WBC: 6.9 10*3/uL (ref 4.0–10.5)
nRBC: 0 % (ref 0.0–0.2)

## 2021-02-28 LAB — LACTIC ACID, PLASMA: Lactic Acid, Venous: 2.8 mmol/L (ref 0.5–1.9)

## 2021-02-28 LAB — BRAIN NATRIURETIC PEPTIDE: B Natriuretic Peptide: 4007.4 pg/mL — ABNORMAL HIGH (ref 0.0–100.0)

## 2021-02-28 MED ORDER — ACETAMINOPHEN 325 MG PO TABS
650.0000 mg | ORAL_TABLET | Freq: Four times a day (QID) | ORAL | Status: DC | PRN
  Administered 2021-03-02 – 2021-03-05 (×2): 650 mg via ORAL
  Filled 2021-02-28 (×3): qty 2

## 2021-02-28 MED ORDER — EMPAGLIFLOZIN 10 MG PO TABS
10.0000 mg | ORAL_TABLET | Freq: Every day | ORAL | Status: DC
  Administered 2021-03-01 – 2021-03-09 (×9): 10 mg via ORAL
  Filled 2021-02-28 (×10): qty 1

## 2021-02-28 MED ORDER — ATORVASTATIN CALCIUM 10 MG PO TABS
20.0000 mg | ORAL_TABLET | Freq: Every day | ORAL | Status: DC
  Administered 2021-03-01 – 2021-03-09 (×9): 20 mg via ORAL
  Filled 2021-02-28 (×9): qty 2

## 2021-02-28 MED ORDER — PAROXETINE HCL 10 MG PO TABS
10.0000 mg | ORAL_TABLET | Freq: Every day | ORAL | Status: DC
  Administered 2021-03-01 – 2021-03-09 (×9): 10 mg via ORAL
  Filled 2021-02-28 (×4): qty 1
  Filled 2021-02-28: qty 0.5
  Filled 2021-02-28 (×4): qty 1

## 2021-02-28 MED ORDER — DOXYCYCLINE HYCLATE 100 MG PO TABS
100.0000 mg | ORAL_TABLET | Freq: Two times a day (BID) | ORAL | Status: DC
  Administered 2021-02-28 – 2021-03-08 (×15): 100 mg via ORAL
  Filled 2021-02-28 (×15): qty 1

## 2021-02-28 MED ORDER — SODIUM CHLORIDE 0.9% FLUSH
3.0000 mL | Freq: Two times a day (BID) | INTRAVENOUS | Status: DC
  Administered 2021-02-28 – 2021-03-09 (×16): 3 mL via INTRAVENOUS

## 2021-02-28 MED ORDER — SACUBITRIL-VALSARTAN 97-103 MG PO TABS
1.0000 | ORAL_TABLET | Freq: Two times a day (BID) | ORAL | Status: DC
  Administered 2021-02-28 – 2021-03-09 (×17): 1 via ORAL
  Filled 2021-02-28 (×19): qty 1

## 2021-02-28 MED ORDER — POLYETHYLENE GLYCOL 3350 17 G PO PACK
17.0000 g | PACK | Freq: Every day | ORAL | Status: DC | PRN
  Administered 2021-03-05: 17 g via ORAL
  Filled 2021-02-28 (×3): qty 1

## 2021-02-28 MED ORDER — METOPROLOL SUCCINATE ER 50 MG PO TB24
50.0000 mg | ORAL_TABLET | Freq: Every day | ORAL | Status: DC
  Administered 2021-03-01 – 2021-03-09 (×9): 50 mg via ORAL
  Filled 2021-02-28 (×6): qty 1
  Filled 2021-02-28: qty 2
  Filled 2021-02-28: qty 1
  Filled 2021-02-28: qty 2

## 2021-02-28 MED ORDER — FUROSEMIDE 10 MG/ML IJ SOLN
120.0000 mg | Freq: Two times a day (BID) | INTRAVENOUS | Status: DC
  Administered 2021-03-01 – 2021-03-09 (×17): 120 mg via INTRAVENOUS
  Filled 2021-02-28: qty 10
  Filled 2021-02-28: qty 12
  Filled 2021-02-28: qty 10
  Filled 2021-02-28 (×2): qty 12
  Filled 2021-02-28 (×3): qty 10
  Filled 2021-02-28: qty 12
  Filled 2021-02-28: qty 10
  Filled 2021-02-28: qty 12
  Filled 2021-02-28: qty 10
  Filled 2021-02-28: qty 12
  Filled 2021-02-28: qty 2
  Filled 2021-02-28: qty 12
  Filled 2021-02-28: qty 10
  Filled 2021-02-28 (×4): qty 12

## 2021-02-28 MED ORDER — DABIGATRAN ETEXILATE MESYLATE 150 MG PO CAPS
150.0000 mg | ORAL_CAPSULE | Freq: Two times a day (BID) | ORAL | Status: DC
  Administered 2021-02-28 – 2021-03-09 (×17): 150 mg via ORAL
  Filled 2021-02-28 (×19): qty 1

## 2021-02-28 MED ORDER — ACETAMINOPHEN 650 MG RE SUPP: 650.0000 mg | Freq: Four times a day (QID) | RECTAL | Status: DC | PRN

## 2021-02-28 MED ORDER — LATANOPROST 0.005 % OP SOLN
1.0000 [drp] | Freq: Every day | OPHTHALMIC | Status: DC
  Administered 2021-03-01 – 2021-03-08 (×8): 1 [drp] via OPHTHALMIC
  Filled 2021-02-28 (×2): qty 2.5

## 2021-02-28 MED ORDER — ALLOPURINOL 300 MG PO TABS
300.0000 mg | ORAL_TABLET | Freq: Every day | ORAL | Status: DC
  Administered 2021-03-01 – 2021-03-09 (×9): 300 mg via ORAL
  Filled 2021-02-28 (×2): qty 3
  Filled 2021-02-28 (×7): qty 1

## 2021-02-28 MED ORDER — FUROSEMIDE 10 MG/ML IJ SOLN
80.0000 mg | Freq: Once | INTRAMUSCULAR | Status: AC
  Administered 2021-02-28: 80 mg via INTRAVENOUS
  Filled 2021-02-28: qty 8

## 2021-02-28 MED ORDER — CLONAZEPAM 0.125 MG PO TBDP
0.2500 mg | ORAL_TABLET | Freq: Two times a day (BID) | ORAL | Status: DC
  Administered 2021-02-28 – 2021-03-01 (×2): 0.25 mg via ORAL
  Filled 2021-02-28 (×2): qty 2

## 2021-02-28 MED ORDER — AMLODIPINE BESYLATE 2.5 MG PO TABS
2.5000 mg | ORAL_TABLET | Freq: Every day | ORAL | Status: DC
  Administered 2021-03-01 – 2021-03-09 (×9): 2.5 mg via ORAL
  Filled 2021-02-28 (×9): qty 1

## 2021-02-28 NOTE — ED Provider Notes (Addendum)
[facility] EMERGENCY DEPARTMENT Provider Note   CSN: [id number] Arrival date & time: 02/28/21  1350     History  Chief Complaint  Patient presents with   Wound Check    Samuel Moyer. is a 81 y.o. male.  Patient with history of CHF, A. Fib, hypertension presents today with chief complaint of bilateral lower leg wounds.  Accompanied by his son who provides majority of the history.  Patient began having formation of these blisters over the past few weeks which have now ruptured and have been worsening. Patient saw dermatology and was given doxycycline for same which he is started taking on Friday with no improvement thus far.  Son endorses that he has been attempting to perform dressing changes with significant difficulty.  Also endorses that the patient has been increasingly short of breath and fluid overloaded in the past week with an associated 20 lb weight gain.  He is now unable to get from the bed to the bathroom without stopping for breaks due to being short of breath. Was originally placed on 3L [place] due to O2 drop from ambulating to the ER from his car upon arrival. Was recently increased from 80mg  Lasix in the morning and 80mg  in the evening which originally caused him to loose weight, however soon after he immediately gained the weight back.  The history is provided by the patient and a relative. No language interpreter was used.  Wound Check Associated symptoms include shortness of breath. Pertinent negatives include no chest pain.      Home Medications Prior to Admission medications   Medication Sig Start Date End Date Taking? Authorizing Provider  allopurinol (ZYLOPRIM) 300 MG tablet TAKE 1 TABLET BY MOUTH EVERY DAY TO LOWER URIC ACID FOR GOUT 11/23/20   Burns, Claudina Lick, MD  amLODipine (NORVASC) 5 MG tablet Take 0.5 tablets (2.5 mg total) by mouth daily. 04/29/20   Martinique, Peter M, MD  atorvastatin (LIPITOR) 20 MG tablet Take 1 tablet (20 mg total) by mouth  daily. 04/16/20   Binnie Rail, MD  clonazePAM (KLONOPIN) 0.5 MG tablet Take 0.5 tablets (0.25 mg total) by mouth 2 (two) times daily. 02/23/21   Binnie Rail, MD  colchicine 0.6 MG tablet For gout flare take 2 pills po x 1 and 1 pill 1 hour later 10/14/20   Binnie Rail, MD  dabigatran (PRADAXA) 150 MG CAPS capsule TAKE 1 CAPSULE EVERY 12 HOURS 09/20/20   Martinique, Peter M, MD  empagliflozin (JARDIANCE) 10 MG TABS tablet Take 1 tablet (10 mg total) by mouth daily before breakfast. 07/01/20   Martinique, Peter M, MD  furosemide (LASIX) 40 MG tablet Take 80 mg in the morning and 40 mg in the evening 12/21/20   Martinique, Peter M, MD  LUMIGAN 0.01 % SOLN Place 1 drop into both eyes at bedtime.  03/08/17   [provider]  metoprolol succinate (TOPROL-XL) 50 MG 24 hr tablet Take 1 tablet (50 mg total) by mouth daily. Take with or immediately following a meal. 06/25/20 06/20/21  Martinique, Peter M, MD  Multiple Vitamin (MULTIVITAMIN) tablet Take 1 tablet by mouth every evening.    [provider]  PARoxetine (PAXIL) 20 MG tablet TAKE 1/2 TABLET(10 MG) BY MOUTH DAILY 10/14/20   Burns, Claudina Lick, MD  sacubitril-valsartan (ENTRESTO) 97-103 MG Take 1 tablet by mouth 2 (two) times daily. 06/25/20   Martinique, Peter M, MD  spironolactone (ALDACTONE) 25 MG tablet Take 0.5 tablets (12.5 mg  total) by mouth daily. 06/25/20 06/20/21  Martinique, Peter M, MD      Allergies    Patient has no known allergies.    Review of Systems   Review of Systems  Constitutional:  Negative for chills and fever.  Respiratory:  Positive for cough and shortness of breath. Negative for apnea, choking, chest tightness, wheezing and stridor.   Cardiovascular:  Positive for leg swelling. Negative for chest pain.  Skin:  Positive for wound.  All other systems reviewed and are negative.  Physical Exam Updated Vital Signs BP (!) 125/92    Pulse (!) 52    Temp 97.7 F (36.5 C)    Resp (!) 27    Ht 5\' 7"  (1.702 m)    Wt 98.5 kg    SpO2 93%     BMI 34.01 kg/m  Physical Exam Vitals and nursing note reviewed.  Constitutional:      Appearance: Normal appearance. He is obese.     Comments: Patient resting comfortably in bed in no acute distress  HENT:     Head: Normocephalic and atraumatic.  Eyes:     Extraocular Movements: Extraocular movements intact.     Pupils: Pupils are equal, round, and reactive to light.  Cardiovascular:     Rate and Rhythm: Normal rate and regular rhythm.     Heart sounds: Normal heart sounds.  Pulmonary:     Effort: Pulmonary effort is normal. No respiratory distress.     Comments: Crackles in bilateral lung bases Abdominal:     General: There is distension.     Palpations: Abdomen is soft.     Tenderness: There is no abdominal tenderness.  Musculoskeletal:     Cervical back: Normal range of motion.  Skin:    Comments: Bilateral lower extremities with ruptured blisters with signs of healing around edges. No purulent discharge present. Bilateral venous stasis present. DP and PT pulses intact. 3+ pitting edema present in bilateral lower extremities  Neurological:     General: No focal deficit present.     Mental Status: He is alert.  Psychiatric:        Mood and Affect: Mood normal.        Behavior: Behavior normal.       ED Results / Procedures / Treatments   Labs (all labs ordered are listed, but only abnormal results are displayed) Labs Reviewed  CBC WITH DIFFERENTIAL/PLATELET - Abnormal; Notable for the following components:      Result Value   RBC 4.19 (*)    MCV 103.6 (*)    MCH 34.1 (*)    RDW 19.0 (*)    Platelets 146 (*)    All other components within normal limits  BASIC METABOLIC PANEL - Abnormal; Notable for the following components:   CO2 17 (*)    Glucose, Bld 107 (*)    BUN 41 (*)    Creatinine, Ser 1.98 (*)    GFR, Estimated 33 (*)    Anion gap 18 (*)    All other components within normal limits  LACTIC ACID, PLASMA - Abnormal; Notable for the following  components:   Lactic Acid, Venous 2.8 (*)    All other components within normal limits  BRAIN NATRIURETIC PEPTIDE - Abnormal; Notable for the following components:   B Natriuretic Peptide 4,007.4 (*)    All other components within normal limits  LACTIC ACID, PLASMA    EKG None  Radiology DG Chest Portable 1 View  Result Date: 02/28/2021  CLINICAL DATA:  Shortness of breath. EXAM: PORTABLE CHEST 1 VIEW COMPARISON:  Chest x-ray 02/23/2021. FINDINGS: Cardiac silhouette is markedly enlarged unchanged. There is no focal lung consolidation. There may be a small left pleural effusion. There is no evidence for pneumothorax or acute fracture. IMPRESSION: 1. Stable marked cardiomegaly. 2. Questionable small left pleural effusion. Electronically Signed   By: Ronney Asters M.D.   On: 02/28/2021 18:49    Procedures Procedures    Medications Ordered in ED Medications  furosemide (LASIX) injection 80 mg (has no administration in time range)    ED Course/ Medical Decision Making/ A&P                           Medical Decision Making Amount and/or Complexity of Data Reviewed Labs: ordered. Radiology: ordered.  Risk Decision regarding hospitalization.   This patient presents to the ED for concern of leg wounds and increasing shortness of breath, this involves an extensive number of treatment options, and is a complaint that carries with it a high risk of complications and morbidity.    Co morbidities that complicate the patient evaluation  CHF, afib, hypercholesterolemia   Additional history obtained:  Additional history obtained from family members External records from outside source obtained and reviewed including cardiology note   Lab Tests:  I Ordered, and personally interpreted labs.  The pertinent results include:  no leukocytosis or anemia. No electrolyte abnormalities. BUN 41 up from 34 5 days ago. Creatinine 1.98 up from 1.73 5 days ago. Bicarb 17. Lactic acid 2.8. BNP  4,007.4   Imaging Studies ordered:  I ordered imaging studies including CXR  I independently visualized and interpreted imaging which showed  1. Stable marked cardiomegaly. 2. Questionable small left pleural effusion. I agree with the radiologist interpretation    Medicines ordered and prescription drug management:  I ordered medication including Lasix  for CHF  Reevaluation of the patient after these medicines showed that the patient improved I have reviewed the patients home medicines and have made adjustments as needed   Dispostion:  After consideration of the diagnostic results and the patients response to treatment, I feel that the patent would benefit from inpatient admission for CHF exacerbation.  Patient presents today for bilateral lower extremity wounds. Wounds without signs of infection, no purulent drainage. No leukocytosis. Low suspicion of cellulitis at this time. Suspect findings are due to fluid overload from CHF exacerbation. Patient and family agrees, stating that patient has been increasingly short of breath recently, now even unable to walk to the bathroom without stopping to catch his breath. He also endorses that he has a 20 lb weight gain recently. Originally was place on O2 after being hypoxic following walking in to the department today. BNP 4,000. Lasix given with some relief, will need further diuresis and evaluation inpatient. Discussed this with patient who is amenable with plan. All questions answered.  Discussed patient with hospitalist who agrees to admit.   This is a shared visit with supervising physician Dr. Alvino Chapel who has independently evaluated patient & provided guidance in evaluation/management/disposition, in agreement with care     Final Clinical Impression(s) / ED Diagnoses Final diagnoses:  Acute on chronic congestive heart failure, unspecified heart failure type [facility])    Rx / DC Orders ED Discharge Orders     None          Bud Face, PA-C 02/28/21 2256    Lc Joynt, Leary Roca, PA-C 02/28/21  0689    Davonna Belling, MD 03/01/21 [phone]

## 2021-02-28 NOTE — H&P (Signed)
History and Physical   Samuel Moyer. OHY:[id number] DOB: 08-Sep-1940 DOA: 02/28/2021  PCP: Binnie Rail, MD  Patient coming from: Home  Chief Complaint: Lower extremity blisters, edema, shortness of breath  HPI: Samuel Moyer. is a 81 y.o. male with medical history significant of venous insufficiency, dementia, angioma, hyperlipidemia, gout, hypertension, sleep apnea on BiPAP, CHF, A. fib, asthma, anxiety presenting with shortness of breath and lower extreme edema with worsening of lower extremity blisters.  Patient states for the past few weeks he has had blisters on his lower extremity that were worsening over time and have not ruptured.  Did see dermatology last week and was started on doxycycline which he started on Friday has not yet seen improvement in his blisters.  Also reports some increasing shortness of breath and lower extremity edema with 20 pound weight gain in the past week or so.  He did recently have his Lasix increased from 80 mg daily daily Brilinta twice daily which seemed to help at first but he has been gaining weight again as above.  He denies fevers, chills, chest pain, constipation, diarrhea, nausea, vomiting, abdominal pain.   ED Course: Vital signs in the ED significant for needing 3 L to maintain saturations.  Lab work-up showed BMP with BUN 41, creatinine elevated 1.99 from baseline at 1.2, bicarb 17 with a gap of 18.  CBC within normal limits other than mild thrombocytopenia at 146.  Lactic acid mildly elevated at 2.8 with repeat pending.  BNP elevated to greater than 4000.  Chest x-ray showed cardiomegaly with questionable small left pleural effusion.  Patient received 80 mg IV Lasix in the ED.  Review of Systems: As per HPI otherwise all other systems reviewed and are negative.  Past Medical History:  Diagnosis Date   A-fib [facility])    Anxiety    Atrial fibrillation (HCC)    CHF (congestive heart failure) ([place])    Claustrophobia    Occasionally when flying     Colitis    Diabetes mellitus, type 2 ([place])    Fracture of one rib, left side, initial encounter for closed fracture 12/27/2016   Occurred 12/21/16 after a fall at home.  Left anterior seventh rib   Gout    Hemorrhoids    Hyperlipidemia    Hypertension    LV dysfunction    EF 40-45%   OSA (obstructive sleep apnea)    CPAP machine    PVC's (premature ventricular contractions)    Skin cancer     Past Surgical History:  Procedure Laterality Date   CARDIOVASCULAR STRESS TEST  03/02/2010   EF 50%   MOHS SURGERY     RIGHT/LEFT HEART CATH AND CORONARY ANGIOGRAPHY [name] 03/18/2020   Procedure: RIGHT/LEFT HEART CATH AND CORONARY ANGIOGRAPHY;  Surgeon: Martinique, Peter M, MD;  Location: [place] [facility];  Service: Cardiovascular;  Laterality: [name];   US ECHOCARDIOGRAPHY  11/15/2009   EF 40-45%    Social History  reports that he quit smoking about 43 years ago. His smoking use included cigarettes. He has a 16.00 pack-year smoking history. He has never used smokeless tobacco. He reports that he does not drink alcohol and does not use drugs.  No Known Allergies  Family History  Problem Relation Age of Onset   Hypertension Father    Heart attack Father        Age 29 (MI)   Heart failure Father    Colonic polyp Sister  and Father   Stroke Mother    Colon cancer Neg Hx    Stomach cancer Neg Hx   Reviewed on admission  Prior to Admission medications   Medication Sig Start Date End Date Taking? Authorizing Provider  allopurinol (ZYLOPRIM) 300 MG tablet TAKE 1 TABLET BY MOUTH EVERY DAY TO LOWER URIC ACID FOR GOUT 11/23/20   Burns, Claudina Lick, MD  amLODipine (NORVASC) 5 MG tablet Take 0.5 tablets (2.5 mg total) by mouth daily. 04/29/20   Martinique, Peter M, MD  atorvastatin (LIPITOR) 20 MG tablet Take 1 tablet (20 mg total) by mouth daily. 04/16/20   Binnie Rail, MD  clonazePAM (KLONOPIN) 0.5 MG tablet Take 0.5 tablets (0.25 mg total) by mouth 2 (two) times daily. 02/23/21   Binnie Rail,  MD  colchicine 0.6 MG tablet For gout flare take 2 pills po x 1 and 1 pill 1 hour later 10/14/20   Binnie Rail, MD  dabigatran (PRADAXA) 150 MG CAPS capsule TAKE 1 CAPSULE EVERY 12 HOURS 09/20/20   Martinique, Peter M, MD  empagliflozin (JARDIANCE) 10 MG TABS tablet Take 1 tablet (10 mg total) by mouth daily before breakfast. 07/01/20   Martinique, Peter M, MD  furosemide (LASIX) 40 MG tablet Take 80 mg in the morning and 40 mg in the evening 12/21/20   Martinique, Peter M, MD  LUMIGAN 0.01 % SOLN Place 1 drop into both eyes at bedtime.  03/08/17   [provider]  metoprolol succinate (TOPROL-XL) 50 MG 24 hr tablet Take 1 tablet (50 mg total) by mouth daily. Take with or immediately following a meal. 06/25/20 06/20/21  Martinique, Peter M, MD  Multiple Vitamin (MULTIVITAMIN) tablet Take 1 tablet by mouth every evening.    [provider]  PARoxetine (PAXIL) 20 MG tablet TAKE 1/2 TABLET(10 MG) BY MOUTH DAILY 10/14/20   Burns, Claudina Lick, MD  sacubitril-valsartan (ENTRESTO) 97-103 MG Take 1 tablet by mouth 2 (two) times daily. 06/25/20   Martinique, Peter M, MD    Physical Exam: Vitals:   02/28/21 2015 02/28/21 2025 02/28/21 2030 02/28/21 2115  BP: (!) 156/95  126/90 134/89  Pulse:  64 65 69  Resp: 13 15 17 16   Temp:      SpO2:  90% 91% 95%  Weight:      Height:        Physical Exam Constitutional:      General: He is not in acute distress.    Appearance: Normal appearance.  HENT:     Head: Normocephalic and atraumatic.     Mouth/Throat:     Mouth: Mucous membranes are moist.     Pharynx: Oropharynx is clear.  Eyes:     Extraocular Movements: Extraocular movements intact.     Pupils: Pupils are equal, round, and reactive to light.  Cardiovascular:     Rate and Rhythm: Normal rate. Rhythm irregular.     Pulses: Normal pulses.     Heart sounds: Normal heart sounds.  Pulmonary:     Effort: Pulmonary effort is normal. No respiratory distress.     Breath sounds: Rales present.  Abdominal:      General: Bowel sounds are normal. There is no distension.     Palpations: Abdomen is soft.     Tenderness: There is no abdominal tenderness.  Musculoskeletal:        General: No swelling or deformity.     Right lower leg: Edema present.     Left lower leg: Edema  present.  Skin:    General: Skin is warm and dry.     Comments: Venous stasis changes.  Blisters and burst blisters on lower extremities bilaterally.  Neurological:     General: No focal deficit present.     Mental Status: Mental status is at baseline.   Labs on Admission: I have personally reviewed following labs and imaging studies  CBC: Recent Labs  Lab 02/23/21 1620 02/28/21 1413  WBC 5.8 6.9  NEUTROABS 4.2 4.6  HGB 13.5 14.3  HCT 41.7 43.4  MCV 102.7* 103.6*  PLT 145.0* 146*    Basic Metabolic Panel: Recent Labs  Lab 02/23/21 1620 02/28/21 1413  NA 143 141  K 3.8 3.9  CL 106 106  CO2 28 17*  GLUCOSE 106* 107*  BUN 34* 41*  CREATININE 1.73* 1.98*  CALCIUM 9.2 9.0    GFR: Estimated Creatinine Clearance: 32.7 mL/min (A) (by C-G formula based on SCr of 1.98 mg/dL (H)).  Liver Function Tests: Recent Labs  Lab 02/23/21 1620  AST 28  ALT 18  ALKPHOS 92  BILITOT 1.6*  PROT 6.5  ALBUMIN 3.9    Urine analysis:    Component Value Date/Time   COLORURINE YELLOW 01/09/2020 0046   APPEARANCEUR CLEAR 01/09/2020 0046   LABSPEC 1.016 01/09/2020 0046   PHURINE 5.0 01/09/2020 0046   GLUCOSEU NEGATIVE 01/09/2020 0046   HGBUR SMALL (A) 01/09/2020 0046   BILIRUBINUR NEGATIVE 01/09/2020 0046   KETONESUR NEGATIVE 01/09/2020 0046   PROTEINUR 30 (A) 01/09/2020 0046   NITRITE NEGATIVE 01/09/2020 0046   LEUKOCYTESUR NEGATIVE 01/09/2020 0046    Radiological Exams on Admission: DG Chest Portable 1 View  Result Date: 02/28/2021 CLINICAL DATA:  Shortness of breath. EXAM: PORTABLE CHEST 1 VIEW COMPARISON:  Chest x-ray 02/23/2021. FINDINGS: Cardiac silhouette is markedly enlarged unchanged. There is no focal  lung consolidation. There may be a small left pleural effusion. There is no evidence for pneumothorax or acute fracture. IMPRESSION: 1. Stable marked cardiomegaly. 2. Questionable small left pleural effusion. Electronically Signed   By: Ronney Asters M.D.   On: 02/28/2021 18:49    EKG: Not yet performed in the ED.  Assessment/Plan Principal Problem:   CHF exacerbation (HCC) Active Problems:   Hypercholesterolemia   Anxiety   Essential hypertension   ATRIAL FIBRILLATION    Complex sleep apnea syndrome   Gout   Blisters of multiple sites   AKI (acute kidney injury) ([place])  CHF exacerbation Acute respiratory failure with hypoxia > Last echo was 2 months ago with EF 40-45% with normal diastolic function, mildly reduced RV function, moderately enlarged RV, dilated atria. > Patient presenting with worsening shortness of breath, worsening lower extremity edema, 20 pound weight gain in the past week or so. > Patient had had his Lasix dose increased recently from 80 daily to 80 twice daily which had initially helped but he has begun gaining weight again. > Blisters, listed below likely worsening in the setting of his volume overload. > Chest x-ray looks stable in the ED but BNP was elevated to greater than 4000 and AKI with creatinine 1.99 as below.  Lactic acid also elevated 2.8 suspect this to be secondary to vascular congestion. - Monitor telemetry - Hold off on repeat echo given how recent his last 1 was just 2 months ago - Strict I's and O's, daily weights - Increase Lasix to 120 mg IV twice daily - Trend renal function and electrolytes - Check magnesium now - Continue to trend lactic acid -  Continue home metoprolol, Entresto, spironolactone, Jardiance  AKI > Patient presented with creatinine elevated to 1.9 from baseline of 1.2. > The setting of CHF exacerbation above. - Continue with diuresis as above - Trend renal function electrolytes - Avoid nephrotoxic agents  Lower extremity  blisters > Has been seen by dermatology outpatient for these and start on doxycycline.  They have continued to worsen despite this.  Suspect this is at least some extent secondary to his fluid overload and fluid blisters worsening as his CHF exacerbation worsened. - We will continue with doxycycline as prescribed by dermatologist - Treat CHF exacerbation as above  Hypertension - Continue home amlodipine, metoprolol, Entresto, spironolactone - Lasix as above  Sleep apnea - Continue home BiPAP  Atrial fibrillation - Continue home metoprolol, Pradaxa  Gout - Continue home allopurinol  Anxiety - Continue home Paxil and Klonopin  DVT prophylaxis: Continue home Pradaxa Code Status:   Full Family Communication:  Son updated at bedside. Disposition Plan:   Patient is from:  Home  Anticipated DC to:  Home  Anticipated DC date:  1 to 7 days  Anticipated DC barriers: None  Consults called:  None Admission status:  Observation, progressive  Severity of Illness: The appropriate patient status for this patient is OBSERVATION. Observation status is judged to be reasonable and necessary in order to provide the required intensity of service to ensure the patient's safety. The patient's presenting symptoms, physical exam findings, and initial radiographic and laboratory data in the context of their medical condition is felt to place them at decreased risk for further clinical deterioration. Furthermore, it is anticipated that the patient will be medically stable for discharge from the hospital within 2 midnights of admission.    Marcelyn Bruins MD [facility]  How to contact the [facility] Attending or Consulting provider [place] or covering provider during after hours [place], for this patient?   Check the care team in [facility] and look for a) attending/consulting [facility] provider listed and b) the [facility] team listed Log into [email or web address] and use [place]'s universal password to access. If you do  not have the password, please contact the hospital operator. Locate the [facility] provider you are looking for under [facility] and page to a number that you can be directly reached. If you still have difficulty reaching the provider, please page the [facility] (Director on Call) for the Hospitalists listed on amion for assistance.  02/28/2021, 9:49 PM

## 2021-02-28 NOTE — ED Triage Notes (Signed)
Patient here for evaluation of bilateral lower extremity "blisters" that he states his son has been treating for him at home for the last week. Patient drowsy, oriented, and wearing 3L O2 [place].

## 2021-02-28 NOTE — Telephone Encounter (Signed)
Has been changing dressing twice a day.   Today, they look worse, with new blisters. Both legs are very bad, reddened.   Wants to know if Samuel Moyer/ Dr. Quay Burow can look at images taken of the legs, or if he can be added on for today.   Pt wife states they are all concerned.   Please advise

## 2021-02-28 NOTE — ED Notes (Signed)
Pts son is going to take the pts wallet home with him. Boniface (son) [phone], Eyvonne Mechanic (wife) [phone]. Pt son says that he gets forgetful at night.

## 2021-02-28 NOTE — ED Provider Triage Note (Signed)
Emergency Medicine Provider Triage Evaluation Note  Samuel Moyer. , a 81 y.o. male  was evaluated in triage.  Pt complains of bilateral leg blisters.  Patient states that these are being treated by his primary doctor and his son, and his son noted that the blisters have now turned black.  Patient has no history of diabetes. Denies injury to the areas.   Review of Systems  Positive: Bilateral leg wounds Negative: Fever, chills, numbness  Physical Exam  BP (!) 134/91 (BP Location: Left Arm)    Pulse 62    Temp 97.7 F (36.5 C)    Resp 16    SpO2 99%  Gen:   Awake, no distress   Resp:  Normal effort  MSK:   Moves extremities without difficulty  Other:  Wounds are bandaged on bilateral posterior calves  Medical Decision Making  Medically screening exam initiated at 2:08 PM.  Appropriate orders placed.  Samuel Moyer. was informed that the remainder of the evaluation will be completed by another provider, this initial triage assessment does not replace that evaluation, and the importance of remaining in the ED until their evaluation is complete.     Samuel Moyer T, PA-C 02/28/21 1409

## 2021-02-28 NOTE — Addendum Note (Signed)
Addended by: Binnie Rail on: 02/28/2021 07:27 AM   Modules accepted: Orders

## 2021-03-01 DIAGNOSIS — I5023 Acute on chronic systolic (congestive) heart failure: Secondary | ICD-10-CM | POA: Diagnosis not present

## 2021-03-01 DIAGNOSIS — I4821 Permanent atrial fibrillation: Secondary | ICD-10-CM | POA: Diagnosis present

## 2021-03-01 DIAGNOSIS — F419 Anxiety disorder, unspecified: Secondary | ICD-10-CM | POA: Diagnosis not present

## 2021-03-01 DIAGNOSIS — M109 Gout, unspecified: Secondary | ICD-10-CM | POA: Diagnosis not present

## 2021-03-01 DIAGNOSIS — I130 Hypertensive heart and chronic kidney disease with heart failure and stage 1 through stage 4 chronic kidney disease, or unspecified chronic kidney disease: Secondary | ICD-10-CM | POA: Diagnosis present

## 2021-03-01 DIAGNOSIS — I5082 Biventricular heart failure: Secondary | ICD-10-CM | POA: Diagnosis present

## 2021-03-01 DIAGNOSIS — G4731 Primary central sleep apnea: Secondary | ICD-10-CM | POA: Diagnosis not present

## 2021-03-01 DIAGNOSIS — N1832 Chronic kidney disease, stage 3b: Secondary | ICD-10-CM | POA: Diagnosis not present

## 2021-03-01 DIAGNOSIS — Z7401 Bed confinement status: Secondary | ICD-10-CM | POA: Diagnosis not present

## 2021-03-01 DIAGNOSIS — F0390 Unspecified dementia without behavioral disturbance: Secondary | ICD-10-CM | POA: Diagnosis not present

## 2021-03-01 DIAGNOSIS — M1A0790 Idiopathic chronic gout, unspecified ankle and foot, without tophus (tophi): Secondary | ICD-10-CM | POA: Diagnosis not present

## 2021-03-01 DIAGNOSIS — N1831 Chronic kidney disease, stage 3a: Secondary | ICD-10-CM | POA: Diagnosis present

## 2021-03-01 DIAGNOSIS — J45909 Unspecified asthma, uncomplicated: Secondary | ICD-10-CM | POA: Diagnosis not present

## 2021-03-01 DIAGNOSIS — Z515 Encounter for palliative care: Secondary | ICD-10-CM | POA: Diagnosis not present

## 2021-03-01 DIAGNOSIS — Z7901 Long term (current) use of anticoagulants: Secondary | ICD-10-CM | POA: Diagnosis not present

## 2021-03-01 DIAGNOSIS — Z7189 Other specified counseling: Secondary | ICD-10-CM | POA: Diagnosis not present

## 2021-03-01 DIAGNOSIS — R638 Other symptoms and signs concerning food and fluid intake: Secondary | ICD-10-CM | POA: Diagnosis not present

## 2021-03-01 DIAGNOSIS — N179 Acute kidney failure, unspecified: Secondary | ICD-10-CM

## 2021-03-01 DIAGNOSIS — I5043 Acute on chronic combined systolic (congestive) and diastolic (congestive) heart failure: Secondary | ICD-10-CM | POA: Diagnosis present

## 2021-03-01 DIAGNOSIS — E1122 Type 2 diabetes mellitus with diabetic chronic kidney disease: Secondary | ICD-10-CM | POA: Diagnosis present

## 2021-03-01 DIAGNOSIS — I5020 Unspecified systolic (congestive) heart failure: Secondary | ICD-10-CM | POA: Diagnosis not present

## 2021-03-01 DIAGNOSIS — I872 Venous insufficiency (chronic) (peripheral): Secondary | ICD-10-CM | POA: Diagnosis not present

## 2021-03-01 DIAGNOSIS — Z789 Other specified health status: Secondary | ICD-10-CM | POA: Diagnosis not present

## 2021-03-01 DIAGNOSIS — G4733 Obstructive sleep apnea (adult) (pediatric): Secondary | ICD-10-CM | POA: Diagnosis present

## 2021-03-01 DIAGNOSIS — Z8249 Family history of ischemic heart disease and other diseases of the circulatory system: Secondary | ICD-10-CM | POA: Diagnosis not present

## 2021-03-01 DIAGNOSIS — D696 Thrombocytopenia, unspecified: Secondary | ICD-10-CM | POA: Diagnosis present

## 2021-03-01 DIAGNOSIS — R0902 Hypoxemia: Secondary | ICD-10-CM | POA: Diagnosis not present

## 2021-03-01 DIAGNOSIS — R238 Other skin changes: Secondary | ICD-10-CM | POA: Diagnosis not present

## 2021-03-01 DIAGNOSIS — H409 Unspecified glaucoma: Secondary | ICD-10-CM | POA: Diagnosis not present

## 2021-03-01 DIAGNOSIS — I10 Essential (primary) hypertension: Secondary | ICD-10-CM | POA: Diagnosis not present

## 2021-03-01 DIAGNOSIS — Z79899 Other long term (current) drug therapy: Secondary | ICD-10-CM | POA: Diagnosis not present

## 2021-03-01 DIAGNOSIS — I4891 Unspecified atrial fibrillation: Secondary | ICD-10-CM | POA: Diagnosis not present

## 2021-03-01 DIAGNOSIS — F03918 Unspecified dementia, unspecified severity, with other behavioral disturbance: Secondary | ICD-10-CM | POA: Diagnosis present

## 2021-03-01 DIAGNOSIS — F0393 Unspecified dementia, unspecified severity, with mood disturbance: Secondary | ICD-10-CM | POA: Diagnosis not present

## 2021-03-01 DIAGNOSIS — Z20822 Contact with and (suspected) exposure to covid-19: Secondary | ICD-10-CM | POA: Diagnosis present

## 2021-03-01 DIAGNOSIS — J9601 Acute respiratory failure with hypoxia: Secondary | ICD-10-CM | POA: Diagnosis present

## 2021-03-01 DIAGNOSIS — I071 Rheumatic tricuspid insufficiency: Secondary | ICD-10-CM | POA: Diagnosis present

## 2021-03-01 DIAGNOSIS — I2729 Other secondary pulmonary hypertension: Secondary | ICD-10-CM | POA: Diagnosis present

## 2021-03-01 DIAGNOSIS — Z66 Do not resuscitate: Secondary | ICD-10-CM | POA: Diagnosis present

## 2021-03-01 DIAGNOSIS — R531 Weakness: Secondary | ICD-10-CM | POA: Diagnosis not present

## 2021-03-01 DIAGNOSIS — I509 Heart failure, unspecified: Secondary | ICD-10-CM | POA: Diagnosis present

## 2021-03-01 DIAGNOSIS — F05 Delirium due to known physiological condition: Secondary | ICD-10-CM | POA: Diagnosis present

## 2021-03-01 DIAGNOSIS — E7800 Pure hypercholesterolemia, unspecified: Secondary | ICD-10-CM | POA: Diagnosis present

## 2021-03-01 DIAGNOSIS — Z87891 Personal history of nicotine dependence: Secondary | ICD-10-CM | POA: Diagnosis not present

## 2021-03-01 DIAGNOSIS — Z85828 Personal history of other malignant neoplasm of skin: Secondary | ICD-10-CM | POA: Diagnosis not present

## 2021-03-01 DIAGNOSIS — E872 Acidosis, unspecified: Secondary | ICD-10-CM | POA: Diagnosis present

## 2021-03-01 DIAGNOSIS — E785 Hyperlipidemia, unspecified: Secondary | ICD-10-CM | POA: Diagnosis not present

## 2021-03-01 LAB — CBC
HCT: 43.1 % (ref 39.0–52.0)
Hemoglobin: 14.1 g/dL (ref 13.0–17.0)
MCH: 34.1 pg — ABNORMAL HIGH (ref 26.0–34.0)
MCHC: 32.7 g/dL (ref 30.0–36.0)
MCV: 104.4 fL — ABNORMAL HIGH (ref 80.0–100.0)
Platelets: 135 10*3/uL — ABNORMAL LOW (ref 150–400)
RBC: 4.13 MIL/uL — ABNORMAL LOW (ref 4.22–5.81)
RDW: 19.1 % — ABNORMAL HIGH (ref 11.5–15.5)
WBC: 7.5 10*3/uL (ref 4.0–10.5)
nRBC: 0 % (ref 0.0–0.2)

## 2021-03-01 LAB — COMPREHENSIVE METABOLIC PANEL
ALT: 21 U/L (ref 0–44)
AST: 36 U/L (ref 15–41)
Albumin: 3.6 g/dL (ref 3.5–5.0)
Alkaline Phosphatase: 98 U/L (ref 38–126)
Anion gap: 15 (ref 5–15)
BUN: 39 mg/dL — ABNORMAL HIGH (ref 8–23)
CO2: 19 mmol/L — ABNORMAL LOW (ref 22–32)
Calcium: 9 mg/dL (ref 8.9–10.3)
Chloride: 107 mmol/L (ref 98–111)
Creatinine, Ser: 1.89 mg/dL — ABNORMAL HIGH (ref 0.61–1.24)
GFR, Estimated: 35 mL/min — ABNORMAL LOW (ref >60)
Glucose, Bld: 80 mg/dL (ref 70–99)
Potassium: 3.5 mmol/L (ref 3.5–5.1)
Sodium: 141 mmol/L (ref 135–145)
Total Bilirubin: 2.2 mg/dL — ABNORMAL HIGH (ref 0.3–1.2)
Total Protein: 6.7 g/dL (ref 6.5–8.1)

## 2021-03-01 LAB — MAGNESIUM: Magnesium: 2.1 mg/dL (ref 1.7–2.4)

## 2021-03-01 LAB — I-STAT ARTERIAL BLOOD GAS, ED
Acid-base deficit: 3 mmol/L — ABNORMAL HIGH (ref 0.0–2.0)
Bicarbonate: 21.3 mmol/L (ref 20.0–28.0)
Calcium, Ion: 1.19 mmol/L (ref 1.15–1.40)
HCT: 43 % (ref 39.0–52.0)
Hemoglobin: 14.6 g/dL (ref 13.0–17.0)
O2 Saturation: 99 %
Potassium: 3.5 mmol/L (ref 3.5–5.1)
Sodium: 139 mmol/L (ref 135–145)
TCO2: 22 mmol/L (ref 22–32)
pCO2 arterial: 35.9 mmHg (ref 32.0–48.0)
pH, Arterial: 7.381 (ref 7.350–7.450)
pO2, Arterial: 117 mmHg — ABNORMAL HIGH (ref 83.0–108.0)

## 2021-03-01 LAB — RESP PANEL BY RT-PCR (FLU A&B, COVID) ARPGX2
Influenza A by PCR: NEGATIVE
Influenza B by PCR: NEGATIVE
SARS Coronavirus 2 by RT PCR: NEGATIVE

## 2021-03-01 MED ORDER — IPRATROPIUM-ALBUTEROL 0.5-2.5 (3) MG/3ML IN SOLN
3.0000 mL | Freq: Four times a day (QID) | RESPIRATORY_TRACT | Status: DC
  Administered 2021-03-01 – 2021-03-03 (×10): 3 mL via RESPIRATORY_TRACT
  Filled 2021-03-01 (×10): qty 3

## 2021-03-01 MED ORDER — CLONAZEPAM 0.25 MG PO TBDP: 0.2500 mg | ORAL_TABLET | Freq: Two times a day (BID) | ORAL | Status: DC | PRN

## 2021-03-01 MED ORDER — ALBUTEROL SULFATE (2.5 MG/3ML) 0.083% IN NEBU
2.5000 mg | INHALATION_SOLUTION | RESPIRATORY_TRACT | Status: DC | PRN
  Administered 2021-03-01: 2.5 mg via RESPIRATORY_TRACT
  Filled 2021-03-01: qty 3

## 2021-03-01 MED ORDER — HALOPERIDOL LACTATE 5 MG/ML IJ SOLN
1.0000 mg | Freq: Four times a day (QID) | INTRAMUSCULAR | Status: DC | PRN
  Filled 2021-03-01: qty 1

## 2021-03-01 MED ORDER — HALOPERIDOL LACTATE 2 MG/ML PO CONC: 1.0000 mg | Freq: Four times a day (QID) | ORAL | Status: DC | PRN

## 2021-03-01 NOTE — ED Notes (Signed)
Pt tried to get out of bed. RN and NT repositioned pt, gave him something to drink. MD at bedside.

## 2021-03-01 NOTE — Progress Notes (Signed)
Chaplain responded to call requesting support for pt's son. Son initially concerned about having a [place] priest visit his father tonight.  Quickly became concerned about his father's restlessness.  Chaplain offered ministry of presence as staff came to help the pt rest better.  Son reports his dad and the family members of [facility] on [address] [place].  Son made the decision to not call tonight. He will call priest in the morning to see if he can come and help his father rest easier.  Chaplain advised son to have Chaplain paged anytime throughout the night if there is any kind of spiritual support needed.  [place]

## 2021-03-01 NOTE — Progress Notes (Signed)
Patient ID: Samuel Teschner., male   DOB: 17-Jul-1940, 81 y.o.   MRN: [id number]  PROGRESS NOTE    Samuel Moyer.  BJS:[id number] DOB: 01-18-1941 DOA: 02/28/2021 PCP: Binnie Rail, MD   Brief Narrative:   81 y.o. male with medical history significant of venous insufficiency, dementia, angioma, hyperlipidemia, gout, hypertension, sleep apnea on BiPAP, CHF, A. fib, asthma, anxiety presented with worsening shortness of breath, lower extremity edema with worsening lower extremity blisters despite being placed on oral doxycycline as an outpatient by dermatology last week.  He apparently gained 20 pound in the last week or so; Lasix dose was recently increased to 80 mg twice daily.  On presentation, creatinine was 1.98, from baseline of 1.2; BNP more than 4000.  Chest x-ray showed cardiomegaly with questionable small left pleural effusion.  He was started on IV Lasix.  Assessment & Plan:   Acute on chronic systolic heart failure Acute respiratory failure with hypoxia -Presented with worsening shortness of breath, almost 20 pound weight gain in the last week or so, BNP of more than 4000 with chest x-ray showing cardiomegaly.  Last echo 2 months ago showed EF of 40 to 45% with normal diastolic function -Currently on 3 L oxygen by nasal cannula.  Wean off as able. -Continue Lasix 120 mg IV every 12 hours.  Consult cardiology.  Strict input and output.  Daily weights.  Fluid restriction.  Continue Entresto and metoprolol succinate.  Acute kidney injury -Baseline creatinine around 1.2.  Presented with creatinine of 1.98.  Creatinine improving to 1.89 today.  Continue diuresis.  Monitor creatinine.  Lower extremity blisters -Recently was started on doxycycline by dermatology as an outpatient.  Possibly complicated by lower extremity fluid overload -Continue doxycycline and outpatient follow-up with dermatology  Hypertension -Monitor blood pressure.  Continue amlodipine, metoprolol succinate, Entresto and  Lasix  Sleep apnea -Continue home BiPAP  Permanent atrial fibrillation -Currently rate controlled.  Continue Pradaxa and metoprolol succinate  Gout -Continue home allopurinol  Anxiety -Continue home Paxil and Klonopin  Generalized deconditioning -PT eval  Dementia -Fall precautions.  Delirium precautions.  Palliative care consultation for goals of care discussion.  Currently listed as full code.  DVT prophylaxis: Pradaxa Code Status: Full Family Communication: None at bedside Disposition Plan: Status is: Observation The patient will require care spanning > 2 midnights and should be moved to inpatient because: Of severity of illness and need for IV diuretics  Planned Discharge Destination: Home  Consultants: Cardiology  Procedures: None  Antimicrobials: Doxycycline continued from outpatient   Subjective: Patient seen and examined at bedside.  Extremely poor historian.  Does not participate in conversation much.  Nursing staff reports that patient refused medications this morning.  No overnight fever, vomiting reported. Objective: Vitals:   03/01/21 0245 03/01/21 0400 03/01/21 0644 03/01/21 0645  BP: (!) 146/80 (!) 151/95 (!) 119/93 129/90  Pulse: 70 62  67  Resp: 16 14 16 17   Temp:      SpO2: 95% 91%  97%  Weight:      Height:        Intake/Output Summary (Last 24 hours) at 03/01/2021 0821 Last data filed at 02/28/2021 2335 Gross per 24 hour  Intake --  Output 950 ml  Net -950 ml   Filed Weights   02/28/21 1958  Weight: 98.5 kg    Examination:  General exam: Appears calm and comfortable.  Currently intermittently requiring 3 L oxygen by nasal cannula  respiratory system: Bilateral decreased breath sounds at  bases with bibasilar crackles Cardiovascular system: S1 & S2 heard, Rate controlled Gastrointestinal system: Abdomen is obese, nondistended, soft and nontender. Normal bowel sounds heard. Extremities: No cyanosis, clubbing; bilateral lower extremity  edema present  Central nervous system: Awake, extremely slow to respond, extremely poor historian.  No focal neurological deficits. Moving extremities Skin: Bilateral lower extremity chronic venous stasis changes along with blisters and burst blisters on lower extremities Psychiatry: Affect is mostly flat.  Hardly participates in any conversation.   Data Reviewed: I have personally reviewed following labs and imaging studies  CBC: Recent Labs  Lab 02/23/21 1620 02/28/21 1413 03/01/21 0336  WBC 5.8 6.9 7.5  NEUTROABS 4.2 4.6  --   HGB 13.5 14.3 14.1  HCT 41.7 43.4 43.1  MCV 102.7* 103.6* 104.4*  PLT 145.0* 146* 938*   Basic Metabolic Panel: Recent Labs  Lab 02/23/21 1620 02/28/21 1413 03/01/21 0336  NA 143 141 141  K 3.8 3.9 3.5  CL 106 106 107  CO2 28 17* 19*  GLUCOSE 106* 107* 80  BUN 34* 41* 39*  CREATININE 1.73* 1.98* 1.89*  CALCIUM 9.2 9.0 9.0  MG  --   --  2.1   GFR: Estimated Creatinine Clearance: 34.3 mL/min (A) (by C-G formula based on SCr of 1.89 mg/dL (H)). Liver Function Tests: Recent Labs  Lab 02/23/21 1620 03/01/21 0336  AST 28 36  ALT 18 21  ALKPHOS 92 98  BILITOT 1.6* 2.2*  PROT 6.5 6.7  ALBUMIN 3.9 3.6   No results for input(s): LIPASE, AMYLASE in the last 168 hours. No results for input(s): AMMONIA in the last 168 hours. Coagulation Profile: No results for input(s): INR, PROTIME in the last 168 hours. Cardiac Enzymes: No results for input(s): CKTOTAL, CKMB, CKMBINDEX, TROPONINI in the last 168 hours. BNP (last 3 results) Recent Labs    02/23/21 1620  PROBNP 4,543.0*   HbA1C: No results for input(s): HGBA1C in the last 72 hours. CBG: No results for input(s): GLUCAP in the last 168 hours. Lipid Profile: No results for input(s): CHOL, HDL, LDLCALC, TRIG, CHOLHDL, LDLDIRECT in the last 72 hours. Thyroid Function Tests: No results for input(s): TSH, T4TOTAL, FREET4, T3FREE, THYROIDAB in the last 72 hours. Anemia Panel: No results for  input(s): VITAMINB12, FOLATE, FERRITIN, TIBC, IRON, RETICCTPCT in the last 72 hours. Sepsis Labs: Recent Labs  Lab 02/28/21 1413  LATICACIDVEN 2.8*    No results found for this or any previous visit (from the past 240 hour(s)).       Radiology Studies: DG Chest Portable 1 View  Result Date: 02/28/2021 CLINICAL DATA:  Shortness of breath. EXAM: PORTABLE CHEST 1 VIEW COMPARISON:  Chest x-ray 02/23/2021. FINDINGS: Cardiac silhouette is markedly enlarged unchanged. There is no focal lung consolidation. There may be a small left pleural effusion. There is no evidence for pneumothorax or acute fracture. IMPRESSION: 1. Stable marked cardiomegaly. 2. Questionable small left pleural effusion. Electronically Signed   By: Ronney Asters M.D.   On: 02/28/2021 18:49        Scheduled Meds:  allopurinol  300 mg Oral Daily   amLODipine  2.5 mg Oral Daily   atorvastatin  20 mg Oral Daily   clonazepam  0.25 mg Oral BID   dabigatran  150 mg Oral Q12H   doxycycline  100 mg Oral Q12H   empagliflozin  10 mg Oral QAC breakfast   latanoprost  1 drop Both Eyes QHS   metoprolol succinate  50 mg Oral Daily   PARoxetine  10 mg Oral Daily   sacubitril-valsartan  1 tablet Oral BID   sodium chloride flush  3 mL Intravenous Q12H   Continuous Infusions:  furosemide            Aline August, MD [facility] 03/01/2021, 8:21 AM

## 2021-03-01 NOTE — Progress Notes (Signed)
RT came by to obtain ABG, but pt is very agitated at this time. MD was made aware. RT will attempt later when pt calms down.

## 2021-03-01 NOTE — Progress Notes (Signed)
Pt transitioned from NRB mask to 8L HFNC per ABG results. Sats are 95%.

## 2021-03-01 NOTE — Progress Notes (Signed)
Pt placed on NRB mask due to sats in low 80's.

## 2021-03-01 NOTE — ED Notes (Signed)
Son at bedside.

## 2021-03-01 NOTE — ED Notes (Signed)
Breakfast orders placed 

## 2021-03-01 NOTE — ED Notes (Signed)
Pt resting comfortably in bed, w/ family at bedside. No needs expressed, no acute distress.

## 2021-03-01 NOTE — Progress Notes (Signed)
Pt placed on BIPAP 10/5 per MD order. Pt is tolerating well at this time. RN aware.

## 2021-03-01 NOTE — Progress Notes (Signed)
Heart Failure Navigator Progress Note  Assessed for Heart & Vascular TOC clinic readiness.  Patient does not meet criteria due to dementia.   Navigator available for reassessment of patient.   Pricilla Holm, MSN, RN Heart Failure Nurse Navigator [phone]

## 2021-03-01 NOTE — Telephone Encounter (Signed)
Currently in the hospital.

## 2021-03-01 NOTE — Consult Note (Signed)
Consultation Note Date: 03/01/2021   Patient Name: Samuel Moyer.  DOB: 26-Jan-1941  MRN: [id number]  Age / Sex: 81 y.o., male  PCP: Binnie Rail, MD Referring Physician: Aline August, MD  Reason for Consultation: Establishing goals of care  HPI/Patient Profile: 81 y.o. male  with past medical history of venous insufficiency, hallucinations/dementia, angioma, hyperlipidemia, gout, hypertension, sleep apnea on BiPAP, CHF (EF 40-45% 12/2020), A. fib, asthma, anxiety presented to the ED from home on 02/28/21 with family concerns for patient's increased shortness of breath, LE edema, and LE non-healing blisters. Patient was admitted on 02/28/2021 with CHF exacerbation, acute respiratory failure with hypoxia, LE blisters, AKI.   Clinical Assessment and Goals of Care: I have reviewed medical records including EPIC notes, labs, and imaging. Received report from primary RN - no acute concerns.   Went to visit patient at bedside - son/Samuel Moyer was present. Patient was lying in bed intermittently awake - he slept majority of visit. Patient is not able to participate in conversation/make complex decisions. No signs or non-verbal gestures of pain or discomfort noted. No respiratory distress, increased work of breathing, or secretions noted.   Met with son/Samuel Moyer in person and daughter/Samuel Moyer via facetime  to discuss diagnosis, prognosis, GOC, EOL wishes, disposition, and options.  I introduced Palliative Medicine as specialized medical care for people living with serious illness. It focuses on providing relief from the symptoms and stress of a serious illness. The goal is to improve quality of life for both the patient and the family.  We discussed a brief life review of the patient as well as functional and nutritional status. 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 He is  married and has two daughters and one son. Prior to hospitalization, patient lived in a private residence with his wife. [place] checks on them both often. Patient has to use a cane for ambulation. Family note they have seen a drastic decline in his functional ability since Sep 2022. Patient is not able to walk short distances within his home without having to stop and rest due to shortness of breath/fatigue. Family note that he even struggles to sit up, has trouble getting out of his chair, and is very unsteady on his feed. Since Christmas time, family report he eats about one meal per day and sleeps a majority of the rest of the day. They also report patient has become incontinent and has to now wear depends.   We discussed patient's current illness and what it means in the larger context of patient's on-going co-morbidities. Education provided that CHF and dementia are progressive, non-curable disease underlying the patient's current acute medical conditions. Education provided on cardiorenal syndrome. Patient's history is significant for gradually worsening progressive symptoms of dementia. Reviewed dementia progression in context of CHF/chronic illness exacerbations. Natural disease trajectory and expectations at EOL were discussed. I attempted to elicit values and goals of care important to the patient. The difference between aggressive medical intervention and comfort care  was considered in light of the patient's goals of care.   Provided education and counseling at length on the philosophy and benefits of hospice care. Discussed that it offers a holistic approach to care in the setting of end-stage illness, and is about supporting the patient where they are allowing nature to take it's course. Discussed the hospice team includes RNs, physicians, social workers, and chaplains. They can provide personal care, support for the family, and help keep patient out of the hospital as well as assist with DME needs for  home hospice. Education provided on the difference between home vs residential hospice.   Reviewed that patient will likely need 24/7 supervision/assistance after discharge. Reviewed hospice at [facility] and home hospice with family and private duty caregiver support per their request.   Advance directives, concepts specific to code status, artificial feeding and hydration, and rehospitalization were considered and discussed. Patient does have a Living Will and HCPOA - requested copies of documents. Reviewed both in conversation today with family.   Encouraged family to consider DNR/DNI status understanding evidenced based poor outcomes in similar hospitalized patient, as the cause of arrest is likely associated with advanced chronic/terminal illness rather than an easily reversible acute cardio-pulmonary event. I explained that DNR/DNI does not change the medical plan and it only comes into effect after a person has arrested (died).  It is a protective measure to keep [place] from harming the patient in their last moments of life. Family are open to DNR/DNI, but were not agreeable to DNR/DNI today with understanding that patient would receive CPR, defibrillation, ACLS medications, and/or intubation. Patient's two daughters are flying into town and they would like to discuss this as a family. Lam and Mickel Baas also explain patient's wife is not aware of his overall condition - offered to call her. They feel it's best they speak with her first. Caedyn and Mickel Baas are open to family meeting after all children arrive from out of town - which would be Thursday.   Discussed with family the importance of continued conversation with each other and the medical providers regarding overall plan of care and treatment options, ensuring decisions are within the context of the patients values and GOCs.    Questions and concerns were addressed. The patient/family was encouraged to call with questions and/or concerns. PMT card was  provided.   Primary Decision Maker: HCPOA- patient's wife/Samuel Moyer    SUMMARY OF RECOMMENDATIONS   Continue full code/full scope for now Family are considering DNR/DNI and possible discharge with hospice care Family request in person meeting with PMT after all of patient's children arrive from out of town late Wednesday Scheduled in-person meeting with patient's children and wife for Thursday 03/03/21 - time TBD PMT will continue to follow and support holistically  Code Status/Advance Care Planning: Full code  Palliative Prophylaxis:  Aspiration, Bowel Regimen, Delirium Protocol, Frequent Pain Assessment, Oral Care, and Turn Reposition  Additional Recommendations (Limitations, Scope, Preferences): Full Scope Treatment  Psycho-social/Spiritual:  Created space and opportunity for patient and family to express thoughts and feelings regarding patient's current medical situation.  Emotional support and therapeutic listening provided.  Prognosis:  Unable to determine  Discharge Planning: To Be Determined      Primary Diagnoses: Present on Admission:  Hypercholesterolemia  ATRIAL FIBRILLATION   Anxiety  Gout  Essential hypertension  Blisters of multiple sites   I have reviewed the medical record, interviewed the patient and family, and examined the patient. The following aspects are pertinent.  Past  Medical History:  Diagnosis Date   A-fib [facility])    Anxiety    Atrial fibrillation (HCC)    CHF (congestive heart failure) ([place])    Claustrophobia    Occasionally when flying    Colitis    Diabetes mellitus, type 2 ([place])    Fracture of one rib, left side, initial encounter for closed fracture 12/27/2016   Occurred 12/21/16 after a fall at home.  Left anterior seventh rib   Gout    Hemorrhoids    Hyperlipidemia    Hypertension    LV dysfunction    EF 40-45%   OSA (obstructive sleep apnea)    CPAP machine    PVC's (premature ventricular contractions)    Skin  cancer    Social History   Socioeconomic History   Marital status: Married    Spouse name: Sunday Spillers   Number of children: 3   Years of education: Not on file   Highest education level: Bachelor's degree (e.g., BA, AB, BS)  Occupational History   Occupation: Retired    Fish farm manager: RETIRED    Comment: [employer]   Tobacco Use   Smoking status: Former    Packs/day: 1.00    Years: 16.00    Pack years: 16.00    Types: Cigarettes    Quit date: 06/30/1977    Years since quitting: 43.6   Smokeless tobacco: Never  Vaping Use   Vaping Use: Never used  Substance and Sexual Activity   Alcohol use: No   Drug use: No   Sexual activity: Not on file  Other Topics Concern   Not on file  Social History Narrative   03/23/20 lives with wife   2 caffeine drinks daily    Social Determinants of Health   Financial Resource Strain: Low Risk    Difficulty of Paying Living Expenses: Not hard at all  Food Insecurity: No Food Insecurity   Worried About Charity fundraiser in the Last Year: Never true   Arboriculturist in the Last Year: Never true  Transportation Needs: No Transportation Needs   Lack of Transportation (Medical): No   Lack of Transportation (Non-Medical): No  Physical Activity: Sufficiently Active   Days of Exercise per Week: 5 days   Minutes of Exercise per Session: 30 min  Stress: No Stress Concern Present   Feeling of Stress : Not at all  Social Connections: Socially Integrated   Frequency of Communication with Friends and Family: More than three times a week   Frequency of Social Gatherings with Friends and Family: More than three times a week   Attends Religious Services: More than 4 times per year   Active Member of [employer] or Organizations: Yes   Attends Music therapist: More than 4 times per year   Marital Status: Married   Family History  Problem Relation Age of Onset   Hypertension Father    Heart attack Father        Age 24 (MI)   Heart failure  Father    Colonic polyp Sister        and Father   Stroke Mother    Colon cancer Neg Hx    Stomach cancer Neg Hx    Scheduled Meds:  allopurinol  300 mg Oral Daily   amLODipine  2.5 mg Oral Daily   atorvastatin  20 mg Oral Daily   dabigatran  150 mg Oral Q12H   doxycycline  100 mg Oral Q12H   empagliflozin  10 mg Oral QAC breakfast   ipratropium-albuterol  3 mL Nebulization Q6H   latanoprost  1 drop Both Eyes QHS   metoprolol succinate  50 mg Oral Daily   PARoxetine  10 mg Oral Daily   sacubitril-valsartan  1 tablet Oral BID   sodium chloride flush  3 mL Intravenous Q12H   Continuous Infusions:  furosemide Stopped (03/01/21 1239)   PRN Meds:.acetaminophen **OR** acetaminophen, albuterol, clonazepam, haloperidol lactate, polyethylene glycol Medications Prior to Admission:  Prior to Admission medications   Medication Sig Start Date End Date Taking? Authorizing Provider  acetaminophen (TYLENOL) 500 MG tablet Take 1,000 mg by mouth every 6 (six) hours as needed for moderate pain or mild pain.   Yes [provider]  allopurinol (ZYLOPRIM) 300 MG tablet TAKE 1 TABLET BY MOUTH EVERY DAY TO LOWER URIC ACID FOR GOUT Patient taking differently: Take 300 mg by mouth daily. 11/23/20  Yes Burns, Claudina Lick, MD  amLODipine (NORVASC) 5 MG tablet Take 0.5 tablets (2.5 mg total) by mouth daily. 04/29/20  Yes [place]ique, Peter M, MD  atorvastatin (LIPITOR) 20 MG tablet Take 1 tablet (20 mg total) by mouth daily. 04/16/20  Yes Burns, Claudina Lick, MD  clonazePAM (KLONOPIN) 0.5 MG tablet Take 0.5 tablets (0.25 mg total) by mouth 2 (two) times daily. 02/23/21  Yes Burns, Claudina Lick, MD  colchicine 0.6 MG tablet For gout flare take 2 pills po x 1 and 1 pill 1 hour later Patient taking differently: Take 0.6 mg by mouth as directed. For gout flare take 2 pills po x 1 and 1 pill 1 hour later 10/14/20  Yes Burns, Claudina Lick, MD  dabigatran (PRADAXA) 150 MG CAPS capsule TAKE 1 CAPSULE EVERY 12 HOURS Patient taking  differently: Take 150 mg by mouth 2 (two) times daily. 09/20/20  Yes [place], Peter M, MD  doxycycline (VIBRA-TABS) 100 MG tablet Take 100 mg by mouth 2 (two) times daily. 02/23/21  Yes [provider]  empagliflozin (JARDIANCE) 10 MG TABS tablet Take 1 tablet (10 mg total) by mouth daily before breakfast. 07/01/20  Yes [place], Peter M, MD  furosemide (LASIX) 40 MG tablet Take 80 mg in the morning and 40 mg in the evening Patient taking differently: Take 40 mg by mouth 2 (two) times daily. Take 80 mg in the morning and 40 mg in the evening 12/21/20  Yes [place], Peter M, MD  LUMIGAN 0.01 % SOLN Place 1 drop into both eyes at bedtime.  03/08/17  Yes [provider]  metoprolol succinate (TOPROL-XL) 50 MG 24 hr tablet Take 1 tablet (50 mg total) by mouth daily. Take with or immediately following a meal. 06/25/20 06/20/21 Yes [place], Peter M, MD  Multiple Vitamin (MULTIVITAMIN) tablet Take 1 tablet by mouth every evening.   Yes [provider]  Neomycin-Bacitracin-Polymyxin (TRIPLE ANTIBIOTIC) OINT Apply 1 application topically as needed (inflammation).   Yes [provider]  PARoxetine (PAXIL) 20 MG tablet TAKE 1/2 TABLET(10 MG) BY MOUTH DAILY Patient taking differently: Take 10 mg by mouth daily. 10/14/20  Yes Burns, Claudina Lick, MD  sacubitril-valsartan (ENTRESTO) 97-103 MG Take 1 tablet by mouth 2 (two) times daily. 06/25/20  Yes [place], Peter M, MD   No Known Allergies Review of Systems  Physical Exam  Vital Signs: BP (!) 144/88    Pulse 77    Temp 97.7 F (36.5 C)    Resp 19    Ht _0  (1.702 m)    Wt 98.5 kg  SpO2 95%    BMI 34.01 kg/m  Pain Scale: 0-10   Pain Score: 0-No pain   SpO2: SpO2: 95 % O2 Device:SpO2: 95 % O2 Flow Rate: .O2 Flow Rate (L/min): 8 L/min  IO: Intake/output summary:  Intake/Output Summary (Last 24 hours) at 03/01/2021 1333 Last data filed at 03/01/2021 1239 Gross per 24 hour  Intake 50 ml  Output 950 ml  Net -900 ml    LBM:    Baseline Weight: Weight: 98.5 kg Most recent weight: Weight: 98.5 kg     Palliative Assessment/Data: PPS 20%     Time In: 1313 Time Out: 1425 Time Total: 72 minutes  Greater than 50%  of this time was spent counseling and coordinating care related to the above assessment and plan.  Signed by: Lin Landsman, NP   Please contact Palliative Medicine Team phone at [phone] for questions and concerns.  For individual provider: See Shea Evans

## 2021-03-01 NOTE — ED Notes (Signed)
RN readjusted pt. Son is at bedside feeding pt.

## 2021-03-01 NOTE — Consult Note (Signed)
Cardiology Consultation:   Patient ID: Samuel Moyer. MRN: [id number]; DOB: January 03, 1941  Admit date: 02/28/2021 Date of Consult: 03/01/2021  PCP:  Binnie Rail, MD   [facility] Providers Cardiologist:  [name], MD   {  Patient Profile:   Samuel Moyer. is a 81 y.o. 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 Starla Link.  Echo 12/2018: LVEF 50-55%, RVSP 46 mm Hg Echo 03/2020: LVEF 45-50% with hypokinesis of basal-mid inferior and inferolateral walls, severe biatrial enlargement, mild to moderate MR, and moderate TR.  RVSP 66.7 mm Hg. Right/left cardiac catheterization was performed on 03/18/2020 and showed mild non-obstructive CAD with moderately elevated LV filling pressures (LVEDP of 2mHg), moderate pulmonary hypertension, and preserved cardiac output.  Hx of volume exacerbation due to dietary noncompliance.  Last echocardiogram January 06, 2021 showed LV function of 40 to 45% with diffuse hypokinesis and inferior basal which appears similar to prior echocardiogram.  Bilateral atrial enlargement.  Last seen by Dr. JMartiniqueon January 11, 2021.  At that time, he was taking Lasix 80 mg a.m. and 40 mg p.m.  His weight was 192 pounds.   History of Present Illness:   Mr. MCreasonpatient here for evaluation of worsening shortness of breath and lower extremity edema with blister.  Patient was seen by PCP about a week ago and increase his Lasix from 80 mg daily (was on 80 mg a.m. and 40 mg p.m. when seen by Dr. TBarnet Glasgowlast month) to 80 mg twice daily.  He reported approximately 20 pound weight gain and lower  extremity edema.  He was seen by dermatologist last week for lower extremity blister, erythema and started on doxycycline.  Patient lives with wife.  Son frequently checks on him.  Patient is demented.  History obtained from chart review and son at bedside.  Patient in recently dealing with balance issue and had a fall as well.  His dementia has progressed.  Wife manages his medication.  Does endorsee high salt diet.   Scr 1.98>>1.89 BNP 4007  Given IV Lasix 80 mg yesterday and now on 120 mg twice daily.  Renal function has improved.  Still significant volume overloaded exam.  Patient is requiring high flow nasal cannula.   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  Left anterior seventh rib   Gout    Hemorrhoids    Hyperlipidemia    Hypertension    LV dysfunction    EF 40-45%   OSA (obstructive sleep apnea)    CPAP machine    PVC's (premature ventricular contractions)    Skin cancer     Past Surgical History:  Procedure Laterality Date   CARDIOVASCULAR STRESS TEST  03/02/2010   EF 50%   MOHS SURGERY     RIGHT/LEFT HEART CATH AND CORONARY ANGIOGRAPHY  [name] 03/18/2020   Procedure: RIGHT/LEFT HEART CATH AND CORONARY ANGIOGRAPHY;  Surgeon: Martinique, Peter M, MD;  Location: [place] [facility];  Service: Cardiovascular;  Laterality: [name];   US ECHOCARDIOGRAPHY  11/15/2009   EF 40-45%    Inpatient Medications: Scheduled Meds:  allopurinol  300 mg Oral Daily   amLODipine  2.5 mg Oral Daily   atorvastatin  20 mg Oral Daily   dabigatran  150 mg Oral Q12H   doxycycline  100 mg Oral Q12H   empagliflozin  10 mg Oral QAC breakfast   ipratropium-albuterol  3 mL Nebulization Q6H    latanoprost  1 drop Both Eyes QHS   metoprolol succinate  50 mg Oral Daily   PARoxetine  10 mg Oral Daily   sacubitril-valsartan  1 tablet Oral BID   sodium chloride flush  3 mL Intravenous Q12H   Continuous Infusions:  furosemide Stopped (03/01/21 1239)   PRN Meds: acetaminophen **OR** acetaminophen, albuterol, clonazepam, haloperidol lactate, polyethylene glycol  Allergies:   No Known Allergies  Social History:   Social History   Socioeconomic History   Marital status: Married    Spouse name: Sunday Spillers   Number of children: 3   Years of education: Not on file   Highest education level: Bachelor's degree (e.g., BA, AB, BS)  Occupational History   Occupation: Retired    Fish farm manager: RETIRED    Comment: [employer]   Tobacco Use   Smoking status: Former    Packs/day: 1.00    Years: 16.00    Pack years: 16.00    Types: Cigarettes    Quit date: 06/30/1977    Years since quitting: 43.6   Smokeless tobacco: Never  Vaping Use   Vaping Use: Never used  Substance and Sexual Activity   Alcohol use: No   Drug use: No   Sexual activity: Not on file  Other Topics Concern   Not on file  Social History Narrative   03/23/20 lives with wife   2 caffeine drinks daily    Social Determinants of Health   Financial Resource Strain: Low Risk    Difficulty of Paying Living Expenses: Not hard at all  Food Insecurity: No Food Insecurity   Worried About Charity fundraiser in the Last Year: Never true   Arboriculturist in the Last Year: Never true  Transportation Needs: No Transportation Needs   Lack of Transportation (Medical): No   Lack of Transportation (Non-Medical): No  Physical Activity: Sufficiently Active   Days of Exercise per Week: 5 days   Minutes of Exercise per Session: 30 min  Stress: No Stress Concern Present   Feeling of Stress : Not at all  Social Connections: Socially Integrated   Frequency of Communication with Friends and Family: More than three times a week    Frequency of Social Gatherings with Friends and Family: More than three times a week   Attends Religious Services: More than 4 times per year   Active Member of [employer] or Organizations: Yes   Attends Music therapist: More than 4 times per year   Marital Status: Married  Human resources officer Violence: Not At Risk   Fear of Current or Ex-Partner: No   Emotionally Abused: No   Physically Abused: No   Sexually Abused: No    Family History:    Family History  Problem Relation Age of Onset   Hypertension Father    Heart attack Father        Age 29 (  MI)   Heart failure Father    Colonic polyp Sister        and Father   Stroke Mother    Colon cancer Neg Hx    Stomach cancer Neg Hx      ROS:  Please see the history of present illness.  All other ROS reviewed and negative.     Physical Exam/Data:   Vitals:   03/01/21 1000 03/01/21 1030 03/01/21 1100 03/01/21 1111  BP: (!) 127/105 (!) 134/91 (!) 144/88   Pulse: (!) 54 79 (!) 32 77  Resp: 17 16 (!) 27 19  Temp:      SpO2: 100% 96% 95% 95%  Weight:      Height:        Intake/Output Summary (Last 24 hours) at 03/01/2021 1323 Last data filed at 03/01/2021 1239 Gross per 24 hour  Intake 50 ml  Output 950 ml  Net -900 ml   Last 3 Weights 02/28/2021 02/23/2021 01/11/2021  Weight (lbs) 217 lb 2.5 oz 217 lb 3.2 oz 192 lb 9.6 oz  Weight (kg) 98.5 kg 98.521 kg 87.363 kg     Body mass index is 34.01 kg/m.  General: Ill appearing demented elderly male on high flow nasal cannula  HEENT: normal Neck: + JVD Vascular: No carotid bruits; Distal pulses 2+ bilaterally Cardiac:  normal S1, S2; RRR; no murmur  Lungs: Diminished breath sound  Abd: soft, nontender, no hepatomegaly  Ext: Ace wrap on right lower extremity edema.  Skin breakdown with blister & erythema on left lower extremity edema Musculoskeletal:  No deformities, BUE and BLE strength normal and equal Skin: warm and dry  Neuro:  CNs 2-12 intact, no focal  abnormalities noted Psych:  Normal affect   EKG:  The EKG was personally reviewed and demonstrates:  No EKG done this admission  Telemetry:  Telemetry was personally reviewed and demonstrates:  atrial fibrillation, PVCs   Relevant CV Studies:  Echo 01/06/2021  1. Diffuse hypokinesis wore in inferior base EF similar to that seen on  TTE 03/10/20. Left ventricular ejection fraction, by estimation, is 40 to  45%. The left ventricle has mildly decreased function. The left ventricle  has no regional wall motion  abnormalities. The left ventricular internal cavity size was mildly  dilated. Left ventricular diastolic parameters were normal.   2. Right ventricular systolic function is moderately reduced. The right  ventricular size is moderately enlarged.   3. Left atrial size was severely dilated.   4. Right atrial size was severely dilated.   5. The mitral valve is abnormal. Mild mitral valve regurgitation. No  evidence of mitral stenosis.   6. Tricuspid valve regurgitation is moderate.   7. The aortic valve is tricuspid. Aortic valve regurgitation is mild. No  aortic stenosis is present.   8. The inferior vena cava is dilated in size with >50% respiratory  variability, suggesting right atrial pressure of 8 mmHg.   Comparison(s): EF 45%, basal-mid inferior and inferolateral left  ventricular  hypokinesis, ascending aorta 32m, mild AI, mild MAC.   RIGHT/LEFT HEART CATH AND CORONARY ANGIOGRAPHY  03/18/2020   Conclusion    Prox LAD to Mid LAD lesion is 20% stenosed. Prox Cx to Mid Cx lesion is 15% stenosed. Prox RCA lesion is 30% stenosed. LV end diastolic pressure is moderately elevated. Hemodynamic findings consistent with moderate pulmonary hypertension.   1. Mild nonobstructive CAD 2. Moderately elevated LV filling pressures. PCWP and EDP 21 mm Hg.  3. Moderate pulmonary HTN  with mean PAP 42 mm Hg 4. Preserved cardiac output. Index 2.4.   Plan: will intensify medical therapy.  Increase lasix to 80 mg in the morning and 40 mg in the afternoon. Stop lisinopril. After 3 days will begin Entresto 49/51 mg daily. Will titrate as tolerated. Consider SGLT2 inhibitor. May resume Pradaxa this evening.   Laboratory Data:  High Sensitivity Troponin:  No results for input(s): TROPONINIHS in the last 720 hours.   Chemistry Recent Labs  Lab 02/23/21 1620 02/28/21 1413 03/01/21 0336 03/01/21 1102  NA 143 141 141 139  K 3.8 3.9 3.5 3.5  CL 106 106 107  --   CO2 28 17* 19*  --   GLUCOSE 106* 107* 80  --   BUN 34* 41* 39*  --   CREATININE 1.73* 1.98* 1.89*  --   CALCIUM 9.2 9.0 9.0  --   MG  --   --  2.1  --   GFRNONAA  --  33* 35*  --   ANIONGAP  --  18* 15  --     Recent Labs  Lab 02/23/21 1620 03/01/21 0336  PROT 6.5 6.7  ALBUMIN 3.9 3.6  AST 28 36  ALT 18 21  ALKPHOS 92 98  BILITOT 1.6* 2.2*   Lipids No results for input(s): CHOL, TRIG, HDL, LABVLDL, LDLCALC, CHOLHDL in the last 168 hours.  Hematology Recent Labs  Lab 02/23/21 1620 02/28/21 1413 03/01/21 0336 03/01/21 1102  WBC 5.8 6.9 7.5  --   RBC 4.06* 4.19* 4.13*  --   HGB 13.5 14.3 14.1 14.6  HCT 41.7 43.4 43.1 43.0  MCV 102.7* 103.6* 104.4*  --   MCH  --  34.1* 34.1*  --   MCHC 32.3 32.9 32.7  --   RDW 18.6* 19.0* 19.1*  --   PLT 145.0* 146* 135*  --    Thyroid  Recent Labs  Lab 02/23/21 1620  TSH 3.09    BNP Recent Labs  Lab 02/23/21 1620 02/28/21 1931  BNP  --  4,007.4*  PROBNP 4,543.0*  --      Radiology/Studies:  DG Chest Portable 1 View  Result Date: 02/28/2021 CLINICAL DATA:  Shortness of breath. EXAM: PORTABLE CHEST 1 VIEW COMPARISON:  Chest x-ray 02/23/2021. FINDINGS: Cardiac silhouette is markedly enlarged unchanged. There is no focal lung consolidation. There may be a small left pleural effusion. There is no evidence for pneumothorax or acute fracture. IMPRESSION: 1. Stable marked cardiomegaly. 2. Questionable small left pleural effusion. Electronically Signed   By:  Ronney Asters M.D.   On: 02/28/2021 18:49    Assessment and Plan:   Chronic combined heart failure BiV failure - Last echocardiogram January 06, 2021 showed LV function of 40 to 45% with diffuse hypokinesis and inferior basal which appears similar to prior echocardiogram.  Bilateral atrial enlargement. -Patient with evidence of right-sided heart failure on exam.  Elevated RSVP on echocardiogram. -Presented with approximately 10 pound weight gain, shortness of breath, lower extremity edema with blister -Agree with IV Lasix 20 mg twice daily -Strict INO and daily weight -Continue Entresto for now>> if worsening renal function tomorrow, will hold -Continue Toprol-XL 50 mg daily -Continue Jardiance -Discussed low-sodium diet with son at bedside in detail however seems poor insight.  Given recent fall and worsening dementia recommended discussion of goals of care.  3.  Persistent atrial fibrillation -Rate controlled -Continue beta-blocker and Pradaxa   4.  Hypertension -Blood pressure elevated at times but relatively controlled -Continue current  therapy  5.  Acute kidney injury -Likely due to volume overload.  Baseline creatinine was 1.2 last month.  Follow closely with diuresis   Risk Assessment/Risk Scores:   New York Heart Association (NYHA) Functional Class NYHA Class III  CHA2DS2-VASc Score = 4   This indicates a 4.8% annual risk of stroke. The patient's score is based upon: CHF History: 1 HTN History: 1 Diabetes History: 0 Stroke History: 0 Vascular Disease History: 0 Age Score: 2 Gender Score: 0    For questions or updates, please contact [place] Please consult [email or web address] for contact info under    Jarrett Soho, PA  03/01/2021 1:23 PM

## 2021-03-02 DIAGNOSIS — R531 Weakness: Secondary | ICD-10-CM | POA: Insufficient documentation

## 2021-03-02 DIAGNOSIS — Z7189 Other specified counseling: Secondary | ICD-10-CM

## 2021-03-02 DIAGNOSIS — J9601 Acute respiratory failure with hypoxia: Secondary | ICD-10-CM | POA: Diagnosis present

## 2021-03-02 LAB — CBC WITH DIFFERENTIAL/PLATELET
Abs Immature Granulocytes: 0.02 10*3/uL (ref 0.00–0.07)
Basophils Absolute: 0 10*3/uL (ref 0.0–0.1)
Basophils Relative: 0 %
Eosinophils Absolute: 0.1 10*3/uL (ref 0.0–0.5)
Eosinophils Relative: 1 %
HCT: 44.4 % (ref 39.0–52.0)
Hemoglobin: 14.2 g/dL (ref 13.0–17.0)
Immature Granulocytes: 0 %
Lymphocytes Relative: 11 %
Lymphs Abs: 1 10*3/uL (ref 0.7–4.0)
MCH: 33.8 pg (ref 26.0–34.0)
MCHC: 32 g/dL (ref 30.0–36.0)
MCV: 105.7 fL — ABNORMAL HIGH (ref 80.0–100.0)
Monocytes Absolute: 1.1 10*3/uL — ABNORMAL HIGH (ref 0.1–1.0)
Monocytes Relative: 11 %
Neutro Abs: 7.2 10*3/uL (ref 1.7–7.7)
Neutrophils Relative %: 77 %
Platelets: 140 10*3/uL — ABNORMAL LOW (ref 150–400)
RBC: 4.2 MIL/uL — ABNORMAL LOW (ref 4.22–5.81)
RDW: 19.5 % — ABNORMAL HIGH (ref 11.5–15.5)
WBC: 9.4 10*3/uL (ref 4.0–10.5)
nRBC: 0.2 % (ref 0.0–0.2)

## 2021-03-02 LAB — COMPREHENSIVE METABOLIC PANEL
ALT: 22 U/L (ref 0–44)
AST: 46 U/L — ABNORMAL HIGH (ref 15–41)
Albumin: 3.5 g/dL (ref 3.5–5.0)
Alkaline Phosphatase: 94 U/L (ref 38–126)
Anion gap: 14 (ref 5–15)
BUN: 40 mg/dL — ABNORMAL HIGH (ref 8–23)
CO2: 20 mmol/L — ABNORMAL LOW (ref 22–32)
Calcium: 9 mg/dL (ref 8.9–10.3)
Chloride: 105 mmol/L (ref 98–111)
Creatinine, Ser: 1.93 mg/dL — ABNORMAL HIGH (ref 0.61–1.24)
GFR, Estimated: 34 mL/min — ABNORMAL LOW (ref >60)
Glucose, Bld: 96 mg/dL (ref 70–99)
Potassium: 4.6 mmol/L (ref 3.5–5.1)
Sodium: 139 mmol/L (ref 135–145)
Total Bilirubin: 2.7 mg/dL — ABNORMAL HIGH (ref 0.3–1.2)
Total Protein: 6.2 g/dL — ABNORMAL LOW (ref 6.5–8.1)

## 2021-03-02 LAB — TSH: TSH: 2.286 u[IU]/mL (ref 0.350–4.500)

## 2021-03-02 LAB — MAGNESIUM: Magnesium: 2.1 mg/dL (ref 1.7–2.4)

## 2021-03-02 LAB — AMMONIA: Ammonia: 16 umol/L (ref 9–35)

## 2021-03-02 NOTE — Assessment & Plan Note (Addendum)
Hypokalemia.  Patient was diuresed with IV furosemide and negative fluid balance was achieved. His renal function at the time of discharge has a serum cr of 1,53 with K of 4,0 and serum bicarbonate at 28. Received Kcl for hypokalemia correction.

## 2021-03-02 NOTE — Progress Notes (Signed)
Pt will wear BIPAP at bedtime

## 2021-03-02 NOTE — Assessment & Plan Note (Addendum)
Continue blood pressure control with amlodipine Continue with heart failure management with metoprolol and entresto. Diuresis with furosemide.

## 2021-03-02 NOTE — Assessment & Plan Note (Addendum)
Patient was admitted to the cardiac ward and received IV furosemide for diuresis, negative fluid balanced was achieved, 12,458 ml, with significant improvement in his symptoms.   Echocardiogram with diffuse hypokinesis, EF 40 to 45%, with mild dilatation of LV cavity. Severe dilatation of right and left atriums. Moderate tricuspid regurgitation.   Patient will continue diuresis with furosemide 80 mg IV bid. Continue with metoprolol, entresto and empagliflozin.  Follow as outpatient with cardiology

## 2021-03-02 NOTE — Progress Notes (Signed)
Pt placed on BIPAP for night rest.  Daughter at bedside and is aware that if there is a issue with BIPAP to have RN call RT.

## 2021-03-02 NOTE — Progress Notes (Signed)
Progress Note   Patient: Samuel Moyer. NTZ:[id number] DOB: Mar 07, 1940 DOA: 02/28/2021     1 DOS: the patient was seen and examined on 03/02/2021   Brief hospital course:  81 y.o. male with medical history significant of venous insufficiency, dementia, angioma, hyperlipidemia, gout, hypertension, sleep apnea on BiPAP, CHF, A. fib, asthma, anxiety presented with worsening shortness of breath, lower extremity edema with worsening lower extremity blisters despite being placed on oral doxycycline as an outpatient by dermatology last week.  He apparently gained 20 pound in the last week or so; Lasix dose was recently increased to 80 mg twice daily.  On presentation, creatinine was 1.98, from baseline of 1.2; BNP more than 4000.  Chest x-ray showed cardiomegaly with questionable small left pleural effusion.  He was started on IV Lasix 120mg  bid. He is being followed by cardiology. Palliative care also following for goals of care.  Assessment and Plan: * Acute on chronic systolic heart failure (HCC)- (present on admission) Patient admitted with decompensated CHF EF 40-45% in 12/2020 Currently on IV lasix Still has evidence of volume overload Creatinine currently stable Continue current treatments Cardiology following, appreciate assistance Continue entresto, Toprol, lasix and empagliflozin   Acute respiratory failure with hypoxia ([place])- (present on admission) Secondary to decompensated CHF Patient currently on 10L oxygen He does not wear any oxygen at home Continue to wean down oxygen as tolerated  ATRIAL FIBRILLATION - (present on admission) Permanent atrial fibrillation HR currently stable Continue current dose of toprol He is anticoagulated with pradaxa  AKI (acute kidney injury) ([place])- (present on admission) Baseline creatinine approx 1.2 Admission creatinine 1.9 Suspect this is cardiorenal Creatinine currently stable with IV lasix Continue to follow renal function and urine  output  Essential hypertension- (present on admission) BP currently stable Continue on amlodipine, entresto and toprol  Hypercholesterolemia- (present on admission) Continue statin  Blisters of multiple sites- (present on admission) See venous insufficiency  Venous (peripheral) insufficiency- (present on admission) Suspect swelling and blisters in LE related to venous insufficiency and CHF Keep LE elevated Should hopefully improve with further diuresis Currently on doxycycline   Neurodegenerative dementia ([place])- (present on admission) Chronic dementia Family reports that patient does have some sundowning This is usually better if family member spends the night (which they intend to do)  Complex sleep apnea syndrome Continue Bipap QHS  Anxiety- (present on admission) Continue on clonazepam and paroxetine   Counseling regarding advance care planning and goals of care Palliative care following Plans are for family meeting tomorrow to further discuss goals of care Currently full code  Generalized weakness PT eval pending        Subjective: patient says he is breathing better today. No chest pain. His son is at bedside and feels he is more awake today.  Physical Exam: Vitals:   03/02/21 0500 03/02/21 0600 03/02/21 0725 03/02/21 0733  BP: 126/79 113/65  125/82  Pulse: 68 62  75  Resp: (!) 21 18  18   Temp:      SpO2: 100% 96% 98% 94%  Weight:      Height:       General exam: Alert, awake, no distress Respiratory system: Clear to auscultation. Respiratory effort normal. Cardiovascular system:RRR. No murmurs, rubs, gallops. Gastrointestinal system: Abdomen is nondistended, soft and nontender. No organomegaly or masses felt. Normal bowel sounds heard. Central nervous system: Alert and oriented. No focal neurological deficits. Extremities: 1+ edema bilaterally with blisters and venous stasis changes bilaterally Psychiatry: pleasant, confused at times  Data  Reviewed:  Basic labs reviewed including chemistry and CBC. Recent echocardiogram from 12/2020 also reviewed. I have ordered repeat chemistry and cbc for AM  Family Communication: discussed with son, Garry at the bedside  Disposition: Status is: Inpatient Remains inpatient appropriate because: continued IV diuretics and still requiring 10L oxygen  Planned Discharge Destination:  to be determined based on PT evaluation and goals of care conversation           Time spent: 40 minutes  Author: Kathie Dike, MD 03/02/2021 11:49 AM  For on call review [email or web address].

## 2021-03-02 NOTE — Assessment & Plan Note (Addendum)
Continue rate control with metoprolol and anticoagulation with dabigatran.

## 2021-03-02 NOTE — Assessment & Plan Note (Addendum)
On clonazepam and paroxetine

## 2021-03-02 NOTE — Progress Notes (Signed)
Progress Note  Patient Name: Samuel Moyer. Date of Encounter: 03/02/2021  [facility] Cardiologist: Amirra Herling [place], MD   Subjective   Patient is much more alert today. Still SOB. Finds it hard to keep oxygen on. No chest pain  Inpatient Medications    Scheduled Meds:  allopurinol  300 mg Oral Daily   amLODipine  2.5 mg Oral Daily   atorvastatin  20 mg Oral Daily   dabigatran  150 mg Oral Q12H   doxycycline  100 mg Oral Q12H   empagliflozin  10 mg Oral QAC breakfast   ipratropium-albuterol  3 mL Nebulization Q6H   latanoprost  1 drop Both Eyes QHS   metoprolol succinate  50 mg Oral Daily   PARoxetine  10 mg Oral Daily   sacubitril-valsartan  1 tablet Oral BID   sodium chloride flush  3 mL Intravenous Q12H   Continuous Infusions:  furosemide 120 mg (03/02/21 1052)   PRN Meds: acetaminophen **OR** acetaminophen, albuterol, clonazepam, haloperidol lactate, polyethylene glycol   Vital Signs    Vitals:   03/02/21 0500 03/02/21 0600 03/02/21 0725 03/02/21 0733  BP: 126/79 113/65  125/82  Pulse: 68 62  75  Resp: (!) _0 Temp:      SpO2: 100% 96% 98% 94%  Weight:      Height:        Intake/Output Summary (Last 24 hours) at 03/02/2021 1148 Last data filed at 03/02/2021 0454 Gross per 24 hour  Intake 50 ml  Output 950 ml  Net -900 ml   Last 3 Weights 02/28/2021 02/23/2021 01/11/2021  Weight (lbs) 217 lb 2.5 oz 217 lb 3.2 oz 192 lb 9.6 oz  Weight (kg) 98.5 kg 98.521 kg 87.363 kg      Telemetry    Afib with controlled rate - Personally Reviewed  ECG    None today - Personally Reviewed  Physical Exam   GEN: elderly chronically ill appearing WM on high flow oxygen Neck: + JVD to jaw Cardiac: iRRR, no murmurs, rubs, or gallops.  Respiratory: bilateral rales GI: Soft, nontender, non-distended  MS: 2+ edema; No deformity. Neuro:  Nonfocal  Psych: Normal affect   Labs    High Sensitivity Troponin:  No results for input(s): TROPONINIHS in the last 720  hours.   Chemistry Recent Labs  Lab 02/23/21 1620 02/28/21 1413 03/01/21 0336 03/01/21 1102 03/02/21 0143  NA 143 141 141 139 139  K 3.8 3.9 3.5 3.5 4.6  CL 106 106 107  --  105  CO2 28 17* 19*  --  20*  GLUCOSE 106* 107* 80  --  96  BUN 34* 41* 39*  --  40*  CREATININE 1.73* 1.98* 1.89*  --  1.93*  CALCIUM 9.2 9.0 9.0  --  9.0  MG  --   --  2.1  --  2.1  PROT 6.5  --  6.7  --  6.2*  ALBUMIN 3.9  --  3.6  --  3.5  AST 28  --  36  --  46*  ALT 18  --  21  --  22  ALKPHOS 92  --  98  --  94  BILITOT 1.6*  --  2.2*  --  2.7*  GFRNONAA  --  33* 35*  --  34*  ANIONGAP  --  18* 15  --  14    Lipids No results for input(s): CHOL, TRIG, HDL, LABVLDL, LDLCALC, CHOLHDL in the last 168 hours.  Hematology Recent Labs  Lab 02/28/21 1413 03/01/21 0336 03/01/21 1102 03/02/21 0143  WBC 6.9 7.5  --  9.4  RBC 4.19* 4.13*  --  4.20*  HGB 14.3 14.1 14.6 14.2  HCT 43.4 43.1 43.0 44.4  MCV 103.6* 104.4*  --  105.7*  MCH 34.1* 34.1*  --  33.8  MCHC 32.9 32.7  --  32.0  RDW 19.0* 19.1*  --  19.5*  PLT 146* 135*  --  140*   Thyroid  Recent Labs  Lab 02/23/21 1620  TSH 3.09    BNP Recent Labs  Lab 02/23/21 1620 02/28/21 1931  BNP  --  4,007.4*  PROBNP 4,543.0*  --     DDimer No results for input(s): DDIMER in the last 168 hours.   Radiology    DG Chest Portable 1 View  Result Date: 02/28/2021 CLINICAL DATA:  Shortness of breath. EXAM: PORTABLE CHEST 1 VIEW COMPARISON:  Chest x-ray 02/23/2021. FINDINGS: Cardiac silhouette is markedly enlarged unchanged. There is no focal lung consolidation. There may be a small left pleural effusion. There is no evidence for pneumothorax or acute fracture. IMPRESSION: 1. Stable marked cardiomegaly. 2. Questionable small left pleural effusion. Electronically Signed   By: Ronney Asters M.D.   On: 02/28/2021 18:49    Cardiac Studies   Echo 01/06/2021  1. Diffuse hypokinesis wore in inferior base EF similar to that seen on  TTE 03/10/20. Left  ventricular ejection fraction, by estimation, is 40 to  45%. The left ventricle has mildly decreased function. The left ventricle  has no regional wall motion  abnormalities. The left ventricular internal cavity size was mildly  dilated. Left ventricular diastolic parameters were normal.   2. Right ventricular systolic function is moderately reduced. The right  ventricular size is moderately enlarged.   3. Left atrial size was severely dilated.   4. Right atrial size was severely dilated.   5. The mitral valve is abnormal. Mild mitral valve regurgitation. No  evidence of mitral stenosis.   6. Tricuspid valve regurgitation is moderate.   7. The aortic valve is tricuspid. Aortic valve regurgitation is mild. No  aortic stenosis is present.   8. The inferior vena cava is dilated in size with >50% respiratory  variability, suggesting right atrial pressure of 8 mmHg.   Comparison(s): EF 45%, basal-mid inferior and inferolateral left  ventricular  hypokinesis, ascending aorta 41m, mild AI, mild MAC.    RIGHT/LEFT HEART CATH AND CORONARY ANGIOGRAPHY  03/18/2020    Conclusion     Prox LAD to Mid LAD lesion is 20% stenosed. Prox Cx to Mid Cx lesion is 15% stenosed. Prox RCA lesion is 30% stenosed. LV end diastolic pressure is moderately elevated. Hemodynamic findings consistent with moderate pulmonary hypertension.   1. Mild nonobstructive CAD 2. Moderately elevated LV filling pressures. PCWP and EDP 21 mm Hg.  3. Moderate pulmonary HTN with mean PAP 42 mm Hg 4. Preserved cardiac output. Index 2.4.   Plan: will intensify medical therapy. Increase lasix to 80 mg in the morning and 40 mg in the afternoon. Stop lisinopril. After 3 days will begin Entresto 49/51 mg daily. Will titrate as tolerated. Consider SGLT2 inhibitor. May resume Pradaxa this evening.      Patient Profile     81y.o. 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 Starla Link.  Assessment & Plan    Acute on Chronic combined heart failure with BiV failure - Last echocardiogram January 06, 2021 showed LV function of 40 to 45% with diffuse hypokinesis and inferior basal which appears similar to prior echocardiogram.  Bilateral atrial enlargement. -Patient with evidence of right-sided heart failure on exam.  Elevated RSVP on echocardiogram. Weight up > 17 lbs in one month despite good medical therapy and close monthly follow up. - renal function is worse -Agree with IV Lasix 120 mg twice daily -Strict INO and daily weight- negative 900 cc yesterday -650 already today -Continue Entresto for now>> if worsening renal function tomorrow, will hold -Continue Toprol-XL 50 mg daily -Continue Jardiance - patient has high sodium diet. Wife has also been ill so he eats out or gets take out mostly. Discussed low-sodium diet   Given recent fall and worsening dementia recommended discussion of goals of care. -will check SPEP. PYP scan would be interesting but even if he had amyloid at this point there is little that would change his natural history.    2.  Persistent atrial fibrillation -Rate controlled -Continue beta-blocker and Pradaxa    3.  Hypertension -Blood pressure elevated at times but relatively controlled -Continue current therapy   4.  Acute on chronic kidney injury -Likely due to volume overload.  Baseline creatinine was 1.2 last month.  Follow closely with diuresis. Appears stable today.  5. Disposition: I suspect he needs more assistance than can be provided at home. Appreciate Palliative consult to establish goals of care.         For questions or updates, please contact [place] Please consult [email or web address] for contact info under        Signed, Priyah Schmuck [place], MD  03/02/2021, 11:48 AM

## 2021-03-02 NOTE — Progress Notes (Signed)
Brief Palliative Medicine Progress Note:  Spoke with patient's son/Samuel Moyer - family meeting scheduled for tomorrow 2/2 at 1:15pm.  Thank you for allowing PMT to assist in the care of this patient.  Samuel Moyer M. [facility] Palliative Medicine Team Team Phone: [phone] NO CHARGE

## 2021-03-02 NOTE — TOC Progression Note (Signed)
Transition of Care (TOC) - Progression Note    Patient Details  Name: Samuel Moyer. MRN: [id number] Date of Birth: 11-09-1940  Transition of Care [facility]) CM/SW Contact  Zenon Mayo, RN Phone Number: 03/02/2021, 3:21 PM  Clinical Narrative:     Transition of Care [facility]) Screening Note   Patient Details  Name: Samuel Moyer. Date of Birth: 16-Oct-1940   Transition of Care [facility]) CM/SW Contact:    Zenon Mayo, RN Phone Number: 03/02/2021, 3:22 PM    From home, CHF ex, AKI,  TOC  will continue to monitor patient advancement through interdisciplinary progression rounds. If new patient transition needs arise, please place a TOC consult.          Expected Discharge Plan and Services                                                 Social Determinants of Health (SDOH) Interventions    Readmission Risk Interventions No flowsheet data found.

## 2021-03-02 NOTE — Hospital Course (Addendum)
Samuel Moyer was admitted to the hospital with the working diagnosis of acute heart failure decompensation.   81 y.o. male with medical history significant of venous insufficiency, dementia, angioma, hyperlipidemia, gout, hypertension, sleep apnea on BiPAP, CHF, A. fib, asthma, anxiety presented with worsening shortness of breath, lower extremity edema with worsening lower extremity blisters despite being placed on oral doxycycline as an outpatient by dermatology for 7 days.  He apparently gained 20 pound in the last week or so; Lasix dose was recently increased to 80 mg twice daily.   On his initial physical examination his blood pressure was 156/95, HR 64, RR 17 and oxygen saturation 91%. Heart with S1 and S2 present and irregular, lungs with rales bilaterally, abdomen soft and positive lower extremity edema.   Na 141, K 3,9, Cl 106, bicarbonate 17, glucose 107 bun 41 and cr 1,98 BNP 4007, lactic acid 2,8 Wbc 6,9, hgb 14,3 hct 43 and plt 146  SARS COVID 19 negative.  Chest radiograph with cardiomegaly.   He was started on IV Lasix 120mg  bid.  Cardiology and palliative care were consulted.  After discussing with family, they had agreed to DNR status.  They wish to continue current treatments and upon discharge wish for patient to have hospice services follow-up.   Improved volume status and plan to continue oral diuretic therapy at home with hospice.

## 2021-03-02 NOTE — Assessment & Plan Note (Signed)
See venous insufficiency

## 2021-03-02 NOTE — Assessment & Plan Note (Addendum)
Continue with statin therapy.  ?

## 2021-03-02 NOTE — Assessment & Plan Note (Addendum)
Family reports that patient does have some sundowning Patient with no agitation.

## 2021-03-02 NOTE — Assessment & Plan Note (Deleted)
PT eval pending

## 2021-03-02 NOTE — Assessment & Plan Note (Signed)
Continue Bipap QHS

## 2021-03-02 NOTE — Assessment & Plan Note (Addendum)
Improvement in oxygenation and dyspnea after diuresis, his oxygenation is 92 to 100% on 2 L/min per  high flow.  Continue home 02 to keep 02 saturation 92% on greater.

## 2021-03-02 NOTE — Assessment & Plan Note (Addendum)
Palliative care following After meeting with family on 2/2, patient is now DNR Family wishes to continue current treatments in order to optimize his cardiac and respiratory status Upon discharge, they are requesting that hospice services follow patient at home DME for patient has been delivered to his home They are arranging private care staff at home for patient

## 2021-03-02 NOTE — Assessment & Plan Note (Addendum)
Positive wounds at his lower extremities, plan to continue with local wound care. Continue leg elevation.

## 2021-03-03 DIAGNOSIS — J9601 Acute respiratory failure with hypoxia: Secondary | ICD-10-CM

## 2021-03-03 DIAGNOSIS — I4821 Permanent atrial fibrillation: Secondary | ICD-10-CM | POA: Diagnosis not present

## 2021-03-03 DIAGNOSIS — I5023 Acute on chronic systolic (congestive) heart failure: Secondary | ICD-10-CM | POA: Diagnosis not present

## 2021-03-03 DIAGNOSIS — N179 Acute kidney failure, unspecified: Secondary | ICD-10-CM | POA: Diagnosis not present

## 2021-03-03 LAB — CBC
HCT: 38.3 % — ABNORMAL LOW (ref 39.0–52.0)
Hemoglobin: 13 g/dL (ref 13.0–17.0)
MCH: 34 pg (ref 26.0–34.0)
MCHC: 33.9 g/dL (ref 30.0–36.0)
MCV: 100.3 fL — ABNORMAL HIGH (ref 80.0–100.0)
Platelets: 147 10*3/uL — ABNORMAL LOW (ref 150–400)
RBC: 3.82 MIL/uL — ABNORMAL LOW (ref 4.22–5.81)
RDW: 18.7 % — ABNORMAL HIGH (ref 11.5–15.5)
WBC: 6.6 10*3/uL (ref 4.0–10.5)
nRBC: 0 % (ref 0.0–0.2)

## 2021-03-03 LAB — COMPREHENSIVE METABOLIC PANEL
ALT: 19 U/L (ref 0–44)
AST: 32 U/L (ref 15–41)
Albumin: 3.1 g/dL — ABNORMAL LOW (ref 3.5–5.0)
Alkaline Phosphatase: 85 U/L (ref 38–126)
Anion gap: 10 (ref 5–15)
BUN: 41 mg/dL — ABNORMAL HIGH (ref 8–23)
CO2: 26 mmol/L (ref 22–32)
Calcium: 9 mg/dL (ref 8.9–10.3)
Chloride: 104 mmol/L (ref 98–111)
Creatinine, Ser: 1.83 mg/dL — ABNORMAL HIGH (ref 0.61–1.24)
GFR, Estimated: 37 mL/min — ABNORMAL LOW (ref >60)
Glucose, Bld: 100 mg/dL — ABNORMAL HIGH (ref 70–99)
Potassium: 3.3 mmol/L — ABNORMAL LOW (ref 3.5–5.1)
Sodium: 140 mmol/L (ref 135–145)
Total Bilirubin: 1.4 mg/dL — ABNORMAL HIGH (ref 0.3–1.2)
Total Protein: 6 g/dL — ABNORMAL LOW (ref 6.5–8.1)

## 2021-03-03 LAB — VITAMIN B12: Vitamin B-12: 1298 pg/mL — ABNORMAL HIGH (ref 180–914)

## 2021-03-03 MED ORDER — POTASSIUM CHLORIDE CRYS ER 20 MEQ PO TBCR
40.0000 meq | EXTENDED_RELEASE_TABLET | Freq: Once | ORAL | Status: AC
  Administered 2021-03-03: 40 meq via ORAL
  Filled 2021-03-03: qty 2

## 2021-03-03 NOTE — Progress Notes (Signed)
[facility]) Hospital Liaison Note   Received request from Transitions of Care Manager, Janett Billow, for [facility] services. HLT to evaluate patient for hospice services.  Please call with any questions/concerns.    Thank you for the opportunity to participate in this patient's care.   Daphene Calamity, MSW [facility] Liaison  [phone]

## 2021-03-03 NOTE — Progress Notes (Signed)
Daily Progress Note   Patient Name: Samuel Moyer.       Date: 03/03/2021 DOB: [birth year]  Age: 81 y.o. MRN#: [id number] Attending Physician: Kathie Dike, MD Primary Care Physician: Binnie Rail, MD Admit Date: 02/28/2021  Reason for Consultation/Follow-up: Establishing goals of care  Subjective: Chart review performed.   1:15 PM Met patient's wife/Sylvia, daughter/Laura, daughter/Kathy, son/Arnold in person and son in law via speakerphone to continue [place] discussions.  Reviewed interval history since patient's admission and family conversations since last PMT meeting with Mickel Baas and Frankey Poot. All medical questions answered. We discussed patient's current illness and what it means in the larger context of patient's on-going co-morbidities. Education provided that CHF and dementia are non-curable, progressive diseases. Natural disease trajectory and expectations at EOL were discussed. I attempted to elicit values and goals of care important to the patient. The difference between aggressive medical intervention and comfort care was considered in light of the patient's goals of care. Family are clear they are looking for additional support for patient as his wife cannot continue to care for him in the home by herself.   Reviewed disposition options in detail to include rehab with outpatient Palliative Care, home with/without private duty caregivers or hospice, LTCF with/without outpatient Palliative Care or hospice. Reviewed pros/cons of him discharging home with additional support vs LTC facility/rehab in context of his dementia. Family state that patient has started to have behavioral disturbances/hallucinations and feel going to a facility would cause anxiety/stress.  Discussed that the goal of rehab  is improvement/stabalization of functional status,  which can be a difficult goal to meet for patients with advanced illness and multiple medical conditions. Reviewed what is needed for someone to have a positive rehabilitation experience to include adequate nutritional intake as well as willingness/ability to participate. Family do not feel patient would do well in rehab - they are not interested in pursuing this option.  Provided education and counseling at length on the philosophy and benefits of hospice care. Discussed that it offers a holistic approach to care in the setting of end-stage illness, and is about supporting the patient where they are allowing nature to take it's course. Discussed the hospice team includes RNs, physicians, social workers, and chaplains. They can provide personal care, support for the family, and help keep patient out of  the hospital as well as assist with DME needs for home hospice. Education provided on the difference between home vs residential hospice. Family are clear that regardless of where patient discharges to, they would like home hospice services - they request [facility].  Family request additional information about private duty caregivers and LTCF, which will assist in their decision making- will notify TOC. Family review that patient has long term care insurance.  Obtained copy of Living Will and reviewed information with family. We talked about transition to comfort measures in house and what that would entail inclusive of medications to control pain, dyspnea, agitation, nausea, and itching. We discussed stopping all unnecessary measures such as blood draws, needle sticks, oxygen, antibiotics, CBGs/insulin, cardiac monitoring, IVF, and frequent vital signs. Family would like to continue current gentle medical interventions without escalation of care. If he declines, they would be open for transition to full comfort care.  Encouraged patient/family to consider  DNR/DNI status understanding evidenced based poor outcomes in similar hospitalized patient, as the cause of arrest is likely associated with advanced chronic/terminal illness rather than an easily reversible acute cardio-pulmonary event. I explained that DNR/DNI does not change the medical plan and it only comes into effect after a person has arrested (died).  It is a protective measure to keep [place] from harming the patient in their last moments of life. Family were agreeable to DNR/DNI with understanding that he would not receive CPR, defibrillation, ACLS medications, or intubation.   Visit also consisted of discussions dealing with the complex and emotionally intense issues of symptom management and palliative care in the setting of serious and potentially life-threatening illness. Palliative care team will continue to support patient, patient's family, and medical team.  Discussed with patient/family the importance of continued conversation with each other and the medical providers regarding overall plan of care and treatment options, ensuring decisions are within the context of the patients values and GOCs.    Questions and concerns were addressed. The patient/family was encouraged to call with questions and/or concerns. PMT card was provided.  After meeting, went to visit patient at bedside. No signs or non-verbal gestures of pain or discomfort noted. No respiratory distress, increased work of breathing, or secretions noted. Patient was sleeping - I did not attempt to wake him. He is on 4L O2 [place].   Length of Stay: 2  Current Medications: Scheduled Meds:   allopurinol  300 mg Oral Daily   amLODipine  2.5 mg Oral Daily   atorvastatin  20 mg Oral Daily   dabigatran  150 mg Oral Q12H   doxycycline  100 mg Oral Q12H   empagliflozin  10 mg Oral QAC breakfast   ipratropium-albuterol  3 mL Nebulization Q6H   latanoprost  1 drop Both Eyes QHS   metoprolol succinate  50 mg Oral Daily   PARoxetine   10 mg Oral Daily   sacubitril-valsartan  1 tablet Oral BID   sodium chloride flush  3 mL Intravenous Q12H    Continuous Infusions:  furosemide 120 mg (03/03/21 0926)    PRN Meds: acetaminophen **OR** acetaminophen, albuterol, clonazepam, haloperidol lactate, polyethylene glycol  Physical Exam Vitals and nursing note reviewed.  Constitutional:      General: He is not in acute distress.    Appearance: He is ill-appearing.  Pulmonary:     Effort: No respiratory distress.  Skin:    General: Skin is warm and dry.  Neurological:     Mental Status: He is lethargic.  Motor: Weakness present.            Vital Signs: BP 109/67 (BP Location: Left Arm)    Pulse 62    Temp 97.8 F (36.6 C) (Oral)    Resp 20    Ht _0  (1.727 m)    Wt 93.6 kg    SpO2 96%    BMI 31.38 kg/m  SpO2: SpO2: 96 % O2 Device: O2 Device: Nasal Cannula O2 Flow Rate: O2 Flow Rate (L/min): 4 L/min  Intake/output summary:  Intake/Output Summary (Last 24 hours) at 03/03/2021 1139 Last data filed at 03/03/2021 1116 Gross per 24 hour  Intake 993 ml  Output 2075 ml  Net -1082 ml   LBM: Last BM Date: 01/28/21 Baseline Weight: Weight: 98.5 kg Most recent weight: Weight: 93.6 kg       Palliative Assessment/Data: PPS 30-40%    Flowsheet Rows    Flowsheet Row Most Recent Value  Intake Tab   Referral Department Hospitalist  Unit at Time of Referral ER  Palliative Care Primary Diagnosis Cardiac  Date Notified 03/01/21  Reason for referral Clarify Goals of Care  Date of Admission 02/28/21  Date first seen by Palliative Care 03/01/21  # of days Palliative referral response time 0 Day(s)  # of days IP prior to Palliative referral 1  Clinical Assessment   Psychosocial & Spiritual Assessment   Palliative Care Outcomes   Patient/Family meeting held? Yes  Who was at the meeting? daughter, son  Palliative Care Outcomes Clarified goals of care, Counseled regarding hospice, Provided psychosocial or spiritual  support       Patient Active Problem List   Diagnosis Date Noted   Acute respiratory failure with hypoxia ([place]) 03/02/2021   Counseling regarding advance care planning and goals of care 03/02/2021   Generalized weakness 03/02/2021   AKI (acute kidney injury) ([place]) 02/28/2021   Meningioma ([place]) 02/23/2021   Blisters of multiple sites 02/23/2021   Insomnia 02/23/2021   Neurodegenerative dementia ([place]) 28/78/6767   Chronic systolic heart failure ([place]) 04/13/2020   COVID 02/20/2020   Visual hallucinations 01/11/2020   Aortic atherosclerosis ([place]) 10/20/2019   Nerve disorder 10/08/2019   Coughing 04/02/2019   DOE (dyspnea on exertion) 11/01/2018   Acute pain of right knee 12/25/2017   Gout 06/03/2015   Prediabetes 03/05/2015   Venous (peripheral) insufficiency 02/16/2014   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 male  with past medical history of venous insufficiency, hallucinations/dementia, angioma, hyperlipidemia, gout, hypertension, sleep apnea on BiPAP, CHF (EF 40-45% 12/2020), A. fib, asthma, anxiety presented to the ED from home on 02/28/21 with family concerns for patient's increased shortness of breath, LE edema, and LE non-healing blisters. Patient was admitted on 02/28/2021 with CHF exacerbation, acute respiratory failure with hypoxia, LE blisters, AKI.  Assessment: Acute on chronic combined heart failure with BiV failure Persistent atrial fibrillation AKI on CKD Acute respiratory failure with hypoxia Venous insufficiency Neurodegenerative dementia with behavorial disturbance  Recommendations/Plan: Continue current gentle medical interventions without escalation of  care. In the event of a decline, family would not want heroic measures and would be open for transition to comfort care Now DNR/DNI - durable DNR form completed and  placed in shadow chart. Copy of DNR form and Living Will was made and will be scanned into Vynca/ACP tab Family's goal is for patient to discharge with home hospice. They are deciding between discharge home with the addition of private duty caregivers vs LTC [facility] consulted for: family request for home hospice with [facility] as well as their request for additional information on private duty caregivers and LTCF PMT will continue to follow and support holistically  Goals of Care and Additional Recommendations: Limitations on Scope of Treatment: Full Scope Treatment, No Artificial Feeding, and No Tracheostomy  Code Status:    Code Status Orders  (From admission, onward)           Start     Ordered   02/28/21 2139  Full code  Continuous        02/28/21 2139           Code Status History     Date Active Date Inactive Code Status Order ID Comments User Context   03/18/2020 1317 03/18/2020 2217 Full Code [id number]  Martinique, Peter M, MD Inpatient      Advance Directive Documentation    Flowsheet Row Most Recent Value  Type of Advance Directive Healthcare Power of Attorney, Living will  Pre-existing out of facility DNR order (yellow form or pink MOST form) --  "MOST" Form in Place? --       Prognosis:  < 6 months   Discharge Planning: To Be Determined  Care plan was discussed with primary RN, patient's family, Dr. Roderic Palau, [facility], [facility] liaison, primary RN  Thank you for allowing the Palliative Medicine Team to assist in the care of this patient.   Total Time 90 minutes Prolonged Time Billed  yes       Greater than 50%  of this time was spent counseling and coordinating care related to the above assessment and plan.  Lin Landsman, NP  Please contact Palliative Medicine Team phone at [phone] for questions  and concerns.

## 2021-03-03 NOTE — Plan of Care (Signed)
  Problem: Cardiac: Goal: Ability to achieve and maintain adequate cardiopulmonary perfusion will improve Outcome: Progressing   

## 2021-03-03 NOTE — TOC Progression Note (Signed)
Transition of Care (TOC) - Progression Note    Patient Details  Name: Samuel Moyer. MRN: [id number] Date of Birth: 04-07-1940  Transition of Care [facility]) CM/SW [place], [place] Phone Number: 03/03/2021, 3:38 PM  Clinical Narrative:    CSW and CM spoke with Pt's daughter Mickel Baas to discuss Hospice options. CM described Hospice at home and the equipment that would be needed. A referral was made to [facility], to assist with any needs when going home. CSW discussed inpatient hospice options and the barriers to placement including a limited payor source and pt having an extended prognosis. Mickel Baas noted understanding and stated she would speak with Palliative again tomorrow before making a decision. TOC will continue to follow for DC needs.        Expected Discharge Plan and Services                                                 Social Determinants of Health (SDOH) Interventions    Readmission Risk Interventions No flowsheet data found.

## 2021-03-03 NOTE — Progress Notes (Deleted)
Cardiology Clinic Note   Patient Name: Samuel Moyer. Date of Encounter: 03/03/2021  Primary Care Provider:  Binnie Rail, MD Primary Cardiologist:  [name], MD  Patient Profile      Past Medical History    Past Medical History:  Diagnosis Date   A-fib [facility])    Anxiety    Atrial fibrillation ([place])    CHF (congestive heart failure) ([place])    Claustrophobia    Occasionally when flying    Colitis    Diabetes mellitus, type 2 ([place])    Fracture of one rib, left side, initial encounter for closed fracture 12/27/2016   Occurred 12/21/16 after a fall at home.  Left anterior seventh rib   Gout    Hemorrhoids    Hyperlipidemia    Hypertension    LV dysfunction    EF 40-45%   OSA (obstructive sleep apnea)    CPAP machine    PVC's (premature ventricular contractions)    Skin cancer    Past Surgical History:  Procedure Laterality Date   CARDIOVASCULAR STRESS TEST  03/02/2010   EF 50%   MOHS SURGERY     RIGHT/LEFT HEART CATH AND CORONARY ANGIOGRAPHY [name] 03/18/2020   Procedure: RIGHT/LEFT HEART CATH AND CORONARY ANGIOGRAPHY;  Surgeon: Martinique, Peter M, MD;  Location: [facility];  Service: Cardiovascular;  Laterality: [name];   US ECHOCARDIOGRAPHY  11/15/2009   EF 40-45%    Allergies  No Known Allergies  History of Present Illness    ***  Home Medications    No current facility-administered medications for this visit.   No current outpatient medications on file.   Facility-Administered Medications Ordered in Other Visits  Medication Dose Route Frequency Provider Last Rate Last Admin   acetaminophen (TYLENOL) tablet 650 mg  650 mg Oral Q6H PRN Marcelyn Bruins, MD   650 mg at 03/02/21 2102   Or   acetaminophen (TYLENOL) suppository 650 mg  650 mg Rectal Q6H PRN Marcelyn Bruins, MD       albuterol (PROVENTIL) (2.5 MG/3ML) 0.083% nebulizer solution 2.5 mg  2.5 mg Nebulization Q2H PRN Aline August, MD   2.5 mg at 03/01/21 1517   allopurinol (ZYLOPRIM)  tablet 300 mg  300 mg Oral Daily Marcelyn Bruins, MD   300 mg at 03/03/21 0849   amLODipine (NORVASC) tablet 2.5 mg  2.5 mg Oral Daily Marcelyn Bruins, MD   2.5 mg at 03/03/21 0848   atorvastatin (LIPITOR) tablet 20 mg  20 mg Oral Daily Marcelyn Bruins, MD   20 mg at 03/03/21 0849   clonazePAM (KLONOPIN) disintegrating tablet 0.25 mg  0.25 mg Oral BID PRN Aline August, MD       dabigatran (PRADAXA) capsule 150 mg  150 mg Oral Q12H Marcelyn Bruins, MD   150 mg at 03/03/21 0848   doxycycline (VIBRA-TABS) tablet 100 mg  100 mg Oral Q12H Marcelyn Bruins, MD   100 mg at 03/03/21 0849   empagliflozin (JARDIANCE) tablet 10 mg  10 mg Oral QAC breakfast Marcelyn Bruins, MD   10 mg at 03/03/21 0849   furosemide (LASIX) 120 mg in dextrose 5 % 50 mL IVPB  120 mg Intravenous BID Marcelyn Bruins, MD 62 mL/hr at 03/03/21 1748 120 mg at 03/03/21 1748   haloperidol lactate (HALDOL) injection 1-2 mg  1-2 mg Intravenous Q6H PRN Alekh, Kshitiz, MD       ipratropium-albuterol (DUONEB) 0.5-2.5 (3) MG/3ML nebulizer solution 3 mL  3 mL Nebulization Q6H Alekh, Kshitiz, MD   3 mL at 03/03/21 1442   latanoprost (XALATAN) 0.005 % ophthalmic solution 1 drop  1 drop Both Eyes QHS Marcelyn Bruins, MD   1 drop at 03/01/21 2102   metoprolol succinate (TOPROL-XL) 24 hr tablet 50 mg  50 mg Oral Daily Marcelyn Bruins, MD   50 mg at 03/03/21 0848   PARoxetine (PAXIL) tablet 10 mg  10 mg Oral Daily Marcelyn Bruins, MD   10 mg at 03/03/21 0848   polyethylene glycol (MIRALAX / GLYCOLAX) packet 17 g  17 g Oral Daily PRN Marcelyn Bruins, MD       sacubitril-valsartan (ENTRESTO) 97-103 mg per tablet  1 tablet Oral BID Marcelyn Bruins, MD   1 tablet at 03/03/21 0848   sodium chloride flush (NS) 0.9 % injection 3 mL  3 mL Intravenous Q12H Marcelyn Bruins, MD   3 mL at 03/03/21 4166     Family History    Family History  Problem Relation Age of Onset   Hypertension Father    Heart  attack Father        Age 81 (MI)   Heart failure Father    Colonic polyp Sister        and Father   Stroke Mother    Colon cancer Neg Hx    Stomach cancer Neg Hx    He indicated that his mother is deceased. He indicated that his father is deceased. He indicated that his sister is alive. He indicated that both of his brothers are alive. He indicated that his maternal grandmother is deceased. He indicated that his maternal grandfather is deceased. He indicated that his paternal grandmother is deceased. He indicated that his paternal grandfather is deceased. He indicated that the status of his neg hx is unknown.  Social History    Social History   Socioeconomic History   Marital status: Married    Spouse name: Sunday Spillers   Number of children: 3   Years of education: Not on file   Highest education level: Bachelor's degree (e.g., BA, AB, BS)  Occupational History   Occupation: Retired    Fish farm manager: RETIRED    Comment: [employer]   Tobacco Use   Smoking status: Former    Packs/day: 1.00    Years: 16.00    Pack years: 16.00    Types: Cigarettes    Quit date: 06/30/1977    Years since quitting: 43.7   Smokeless tobacco: Never  Vaping Use   Vaping Use: Never used  Substance and Sexual Activity   Alcohol use: No   Drug use: No   Sexual activity: Not on file  Other Topics Concern   Not on file  Social History Narrative   03/23/20 lives with wife   2 caffeine drinks daily    Social Determinants of Health   Financial Resource Strain: Low Risk    Difficulty of Paying Living Expenses: Not hard at all  Food Insecurity: No Food Insecurity   Worried About Charity fundraiser in the Last Year: Never true   Arboriculturist in the Last Year: Never true  Transportation Needs: No Transportation Needs   Lack of Transportation (Medical): No   Lack of Transportation (Non-Medical): No  Physical Activity: Sufficiently Active   Days of Exercise per Week: 5 days   Minutes of Exercise per  Session: 30 min  Stress: No Stress Concern Present   Feeling of Stress :  Not at all  Social Connections: Socially Integrated   Frequency of Communication with Friends and Family: More than three times a week   Frequency of Social Gatherings with Friends and Family: More than three times a week   Attends Religious Services: More than 4 times per year   Active Member of [employer] or Organizations: Yes   Attends Music therapist: More than 4 times per year   Marital Status: Married  Human resources officer Violence: Not At Risk   Fear of Current or Ex-Partner: No   Emotionally Abused: No   Physically Abused: No   Sexually Abused: No     Review of Systems    General:  No chills, fever, night sweats or weight changes.  Cardiovascular:  No chest pain, dyspnea on exertion, edema, orthopnea, palpitations, paroxysmal nocturnal dyspnea. Dermatological: No rash, lesions/masses Respiratory: No cough, dyspnea Urologic: No hematuria, dysuria Abdominal:   No nausea, vomiting, diarrhea, bright red blood per rectum, melena, or hematemesis Neurologic:  No visual changes, wkns, changes in mental status. All other systems reviewed and are otherwise negative except as noted above.     Physical Exam    VS:  There were no vitals taken for this visit. , BMI There is no height or weight on file to calculate BMI.     GEN: Well nourished, well developed, in no acute distress. HEENT: normal. Neck: Supple, no JVD, carotid bruits, or masses. Cardiac: RRR, no murmurs, rubs, or gallops. No clubbing, cyanosis, edema.  Radials/DP/PT 2+ and equal bilaterally.  Respiratory:  Respirations regular and unlabored, clear to auscultation bilaterally. GI: Soft, nontender, nondistended, BS + x 4. MS: no deformity or atrophy. Skin: warm and dry, no rash. Neuro:  Strength and sensation are intact. Psych: Normal affect.  Accessory Clinical Findings    ECG personally reviewed by me today- *** - No acute  changes  Lab Results  Component Value Date   WBC 6.6 03/03/2021   HGB 13.0 03/03/2021   HCT 38.3 (L) 03/03/2021   MCV 100.3 (H) 03/03/2021   PLT 147 (L) 03/03/2021   Lab Results  Component Value Date   CREATININE 1.83 (H) 03/03/2021   BUN 41 (H) 03/03/2021   NA 140 03/03/2021   K 3.3 (L) 03/03/2021   CL 104 03/03/2021   CO2 26 03/03/2021   Lab Results  Component Value Date   ALT 19 03/03/2021   AST 32 03/03/2021   ALKPHOS 85 03/03/2021   BILITOT 1.4 (H) 03/03/2021   Lab Results  Component Value Date   CHOL 161 10/14/2020   HDL 66.60 10/14/2020   LDLCALC 81 10/14/2020   TRIG 71.0 10/14/2020   CHOLHDL 2 10/14/2020    Lab Results  Component Value Date   HGBA1C 6.4 02/23/2021    Review of Prior Studies:   Assessment & Plan   1.  ***    Current medicines are reviewed at length with the patient today.  I have spent *** min's  dedicated to the care of this patient on the date of this encounter to include pre-visit review of records, assessment, management and diagnostic testing,with shared decision making. Signed, Phill Myron. West Pugh, ANP, AACC   03/03/2021 7:01 PM    [facility] [address] Office [phone] Fax [phone]  Notice: This dictation was prepared with Dragon dictation along with smaller phrase technology. Any transcriptional errors that result from this process are unintentional and may not be corrected upon review.

## 2021-03-03 NOTE — Progress Notes (Signed)
Progress Note   Patient: Samuel Moyer. PPJ:[id number] DOB: Nov 18, 1940 DOA: 02/28/2021     2 DOS: the patient was seen and examined on 03/03/2021   Brief hospital course:  81 y.o. male with medical history significant of venous insufficiency, dementia, angioma, hyperlipidemia, gout, hypertension, sleep apnea on BiPAP, CHF, A. fib, asthma, anxiety presented with worsening shortness of breath, lower extremity edema with worsening lower extremity blisters despite being placed on oral doxycycline as an outpatient by dermatology last week.  He apparently gained 20 pound in the last week or so; Lasix dose was recently increased to 80 mg twice daily.  On presentation, creatinine was 1.98, from baseline of 1.2; BNP more than 4000.  Chest x-ray showed cardiomegaly with questionable small left pleural effusion.  He was started on IV Lasix 120mg  bid. He is being followed by cardiology. Palliative care also following for goals of care.  After discussing with family, they had agreed to DNR status.  They wish to continue current treatments and upon discharge wish for patient to have hospice services follow-up.  Maintains good diuresis with stable creatinine.  Continue IV diuretics  Assessment and Plan: * Acute on chronic systolic heart failure ([place])- (present on admission) Patient admitted with decompensated CHF EF 40-45% in 12/2020 Currently on IV lasix Still has evidence of volume overload Creatinine currently stable Continue current treatments Cardiology following, appreciate assistance Continue entresto, Toprol, lasix and empagliflozin   Acute respiratory failure with hypoxia ([place])- (present on admission) Secondary to decompensated CHF Patient initially required up to 10 L of oxygen, now down to 4 L He does not wear any oxygen at home Continue to wean down oxygen as tolerated  ATRIAL FIBRILLATION - (present on admission) Permanent atrial fibrillation HR currently stable Continue current dose of  toprol He is anticoagulated with pradaxa  AKI (acute kidney injury) ([place])- (present on admission) Baseline creatinine approx 1.2 Admission creatinine 1.9 Suspect this is cardiorenal Creatinine currently stable with IV lasix Continue to follow renal function and urine output  Essential hypertension- (present on admission) BP currently stable Continue on amlodipine, entresto and toprol  Hypercholesterolemia- (present on admission) Continue statin  Blisters of multiple sites- (present on admission) See venous insufficiency  Venous (peripheral) insufficiency- (present on admission) Suspect swelling and blisters in LE related to venous insufficiency and CHF Keep LE elevated Should hopefully improve with further diuresis Currently on doxycycline  Appreciate wound care input  Neurodegenerative dementia ([place])- (present on admission) Chronic dementia Family reports that patient does have some sundowning This is usually better if family member spends the night (which they intend to do)  Complex sleep apnea syndrome Continue Bipap QHS  Anxiety- (present on admission) Continue on clonazepam and paroxetine   Counseling regarding advance care planning and goals of care Palliative care following After meeting with family on 2/2, patient is now DNR Family wishes to continue current treatments in order to optimize his cardiac and respiratory status Upon discharge, they are requesting that hospice services follow patient If patient has a decline in the interim, they would consider transitioning to comfort measures  Generalized weakness PT eval pending        Subjective: Sleeping on my arrival, wakes up to voice  Physical Exam: Vitals:   03/03/21 1659 03/03/21 1944 03/03/21 2100 03/03/21 2104  BP: 111/81  121/79   Pulse: 67  88 84  Resp: 20  16 16   Temp: 97.8 F (36.6 C)   97.7 F (36.5 C)  TempSrc: Oral  Oral  SpO2: 98% 100% 98% 98%  Weight:      Height:        General exam: Alert, awake, no distress Respiratory system: Crackles at bases. Respiratory effort normal. Cardiovascular system:RRR. No murmurs, rubs, gallops. Gastrointestinal system: Abdomen is nondistended, soft and nontender. No organomegaly or masses felt. Normal bowel sounds heard. Central nervous system:  No focal neurological deficits. Extremities: Pitting edema bilaterally Skin: No rashes, lesions or ulcers Psychiatry: Confused, pleasant   Data Reviewed:  Reviewed serum chemistries and CBC.  Repeat chemistry and CBC ordered for a.m.  Family Communication: No family today  Disposition: Status is: Inpatient Remains inpatient appropriate because: Continued IV diuretics          Planned Discharge Destination:  To be determined.  Home with hospice services and private duty sitters versus long-term care     Time spent: 35 minutes  Author: Kathie Dike, MD 03/03/2021 9:31 PM  For on call review [email or web address].

## 2021-03-03 NOTE — Progress Notes (Signed)
Progress Note  Patient Name: Samuel Moyer. Date of Encounter: 03/03/2021  [facility] Cardiologist: Jemimah Cressy Martinique, MD   Subjective   Patient is  alert today. Still SOB. Oxygen requirement less.   Inpatient Medications    Scheduled Meds:  allopurinol  300 mg Oral Daily   amLODipine  2.5 mg Oral Daily   atorvastatin  20 mg Oral Daily   dabigatran  150 mg Oral Q12H   doxycycline  100 mg Oral Q12H   empagliflozin  10 mg Oral QAC breakfast   ipratropium-albuterol  3 mL Nebulization Q6H   latanoprost  1 drop Both Eyes QHS   metoprolol succinate  50 mg Oral Daily   PARoxetine  10 mg Oral Daily   potassium chloride  40 mEq Oral Once   sacubitril-valsartan  1 tablet Oral BID   sodium chloride flush  3 mL Intravenous Q12H   Continuous Infusions:  furosemide 120 mg (03/03/21 0926)   PRN Meds: acetaminophen **OR** acetaminophen, albuterol, clonazepam, haloperidol lactate, polyethylene glycol   Vital Signs    Vitals:   03/03/21 0226 03/03/21 0424 03/03/21 0820 03/03/21 0903  BP:  132/81  111/84  Pulse:  62  88  Resp:  14  20  Temp:  97.7 F (36.5 C)    TempSrc:  Axillary    SpO2:  99% 97% 97%  Weight: 93.6 kg     Height:        Intake/Output Summary (Last 24 hours) at 03/03/2021 1022 Last data filed at 03/03/2021 0086 Gross per 24 hour  Intake 993 ml  Output 1525 ml  Net -532 ml    Last 3 Weights 03/03/2021 03/02/2021 02/28/2021  Weight (lbs) 206 lb 5.6 oz 207 lb 14.3 oz 217 lb 2.5 oz  Weight (kg) 93.6 kg 94.3 kg 98.5 kg      Telemetry    Afib with controlled rate - Personally Reviewed  ECG    None today - Personally Reviewed  Physical Exam   GEN: elderly chronically ill appearing WM on oxygen Neck: + JVD 6 cm Cardiac: iRRR, no murmurs, rubs, or gallops.  Respiratory: decreased BS in bases GI: Soft, nontender, non-distended  MS: 1+ edema; legs now wrapped by wound care Neuro:  Nonfocal  Psych: Normal affect   Labs    High Sensitivity Troponin:  No  results for input(s): TROPONINIHS in the last 720 hours.   Chemistry Recent Labs  Lab 03/01/21 0336 03/01/21 1102 03/02/21 0143 03/03/21 0251  NA 141 139 139 140  K 3.5 3.5 4.6 3.3*  CL 107  --  105 104  CO2 19*  --  20* 26  GLUCOSE 80  --  96 100*  BUN 39*  --  40* 41*  CREATININE 1.89*  --  1.93* 1.83*  CALCIUM 9.0  --  9.0 9.0  MG 2.1  --  2.1  --   PROT 6.7  --  6.2* 6.0*  ALBUMIN 3.6  --  3.5 3.1*  AST 36  --  46* 32  ALT 21  --  22 19  ALKPHOS 98  --  94 85  BILITOT 2.2*  --  2.7* 1.4*  GFRNONAA 35*  --  34* 37*  ANIONGAP 15  --  14 10     Lipids No results for input(s): CHOL, TRIG, HDL, LABVLDL, LDLCALC, CHOLHDL in the last 168 hours.  Hematology Recent Labs  Lab 03/01/21 0336 03/01/21 1102 03/02/21 0143 03/03/21 0251  WBC 7.5  --  9.4 6.6  RBC 4.13*  --  4.20* 3.82*  HGB 14.1 14.6 14.2 13.0  HCT 43.1 43.0 44.4 38.3*  MCV 104.4*  --  105.7* 100.3*  MCH 34.1*  --  33.8 34.0  MCHC 32.7  --  32.0 33.9  RDW 19.1*  --  19.5* 18.7*  PLT 135*  --  140* 147*    Thyroid  Recent Labs  Lab 03/02/21 1318  TSH 2.286     BNP Recent Labs  Lab 02/28/21 1931  BNP 4,007.4*     DDimer No results for input(s): DDIMER in the last 168 hours.   Radiology    No results found.  Cardiac Studies   Echo 01/06/2021  1. Diffuse hypokinesis wore in inferior base EF similar to that seen on  TTE 03/10/20. Left ventricular ejection fraction, by estimation, is 40 to  45%. The left ventricle has mildly decreased function. The left ventricle  has no regional wall motion  abnormalities. The left ventricular internal cavity size was mildly  dilated. Left ventricular diastolic parameters were normal.   2. Right ventricular systolic function is moderately reduced. The right  ventricular size is moderately enlarged.   3. Left atrial size was severely dilated.   4. Right atrial size was severely dilated.   5. The mitral valve is abnormal. Mild mitral valve regurgitation. No   evidence of mitral stenosis.   6. Tricuspid valve regurgitation is moderate.   7. The aortic valve is tricuspid. Aortic valve regurgitation is mild. No  aortic stenosis is present.   8. The inferior vena cava is dilated in size with >50% respiratory  variability, suggesting right atrial pressure of 8 mmHg.   Comparison(s): EF 45%, basal-mid inferior and inferolateral left  ventricular  hypokinesis, ascending aorta 63m, mild AI, mild MAC.    RIGHT/LEFT HEART CATH AND CORONARY ANGIOGRAPHY  03/18/2020    Conclusion     Prox LAD to Mid LAD lesion is 20% stenosed. Prox Cx to Mid Cx lesion is 15% stenosed. Prox RCA lesion is 30% stenosed. LV end diastolic pressure is moderately elevated. Hemodynamic findings consistent with moderate pulmonary hypertension.   1. Mild nonobstructive CAD 2. Moderately elevated LV filling pressures. PCWP and EDP 21 mm Hg.  3. Moderate pulmonary HTN with mean PAP 42 mm Hg 4. Preserved cardiac output. Index 2.4.   Plan: will intensify medical therapy. Increase lasix to 80 mg in the morning and 40 mg in the afternoon. Stop lisinopril. After 3 days will begin Entresto 49/51 mg daily. Will titrate as tolerated. Consider SGLT2 inhibitor. May resume Pradaxa this evening.      Patient Profile     81y.o. 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 AStarla Link  Assessment & Plan    Acute on Chronic combined heart failure with BiV failure - Last echocardiogram January 06, 2021 showed LV function of 40 to 45% with diffuse hypokinesis and inferior basal which appears similar to prior  echocardiogram.  Bilateral atrial enlargement. -Patient with evidence of right-sided heart failure on exam.  Elevated RSVP on echocardiogram. Weight up > 17 lbs in one month despite good medical therapy and close monthly follow up. - renal function is  worse -Agree with IV Lasix 120 mg twice daily -diuresing Ok - weight coming down. Renal function stable.  -Continue Entresto  -Continue Toprol-XL 50 mg daily -Continue Jardiance - low sodium diet with fluid restriction.  - patient has high sodium diet. Wife has also been ill so he eats out or gets take out mostly. Discussed low-sodium diet   Given recent fall and worsening dementia recommended discussion of goals of care. -will check SPEP. PYP scan would be interesting but even if he had amyloid at this point there is little that would change his natural history.    2.  Persistent atrial fibrillation -Rate controlled -Continue beta-blocker and Pradaxa    3.  Hypertension -Blood pressure controlled -Continue current therapy   4.  Acute on chronic kidney injury -Likely due to volume overload.  Baseline creatinine was 1.2 last month.  Follow closely with diuresis. Appears stable today.  5. Disposition: I suspect he needs more assistance than can be provided at home. Appreciate Palliative consult to establish goals of care. Plan meeting today. Would consider PT /OT consult and case management involvement.       For questions or updates, please contact [place] Please consult [email or web address] for contact info under        Signed, Khole Arterburn Martinique, MD  03/03/2021, 10:22 AM

## 2021-03-03 NOTE — Consult Note (Addendum)
[facility] Nurse Consult Note: Reason for Consult: Consult requested for bilat legs.  Pt has generalized edema and developed significant bulla prior to admission, which have now ruptured and evolved into full thickness tissue loss.  Wound type: Left posterior leg red, moist full thickness wound 5X3X.1cm, mod amt yellow drainage Right posterior leg with red moist full thickness wound; 12X6X.2cm, mod amt yellow drainage Dressing procedure/placement/frequency: Topical treatment provided for bedside nurses to perform as follows to promote drying and healing: Apply double-folded xeroform gauze to bilat posterior leg wounds Q day, then cover with ABD pads and kerlex and ace wraps in a spiral fashion, beginning just behind toes to below knees. Please re-consult if further assistance is needed.  Thank-you,  Julien Girt MSN, [place], [place], [place], [place]

## 2021-03-04 ENCOUNTER — Ambulatory Visit: Payer: TRICARE For Life (TFL) | Admitting: Adult Health

## 2021-03-04 DIAGNOSIS — Z515 Encounter for palliative care: Secondary | ICD-10-CM

## 2021-03-04 DIAGNOSIS — R531 Weakness: Secondary | ICD-10-CM

## 2021-03-04 DIAGNOSIS — R238 Other skin changes: Secondary | ICD-10-CM

## 2021-03-04 DIAGNOSIS — Z7189 Other specified counseling: Secondary | ICD-10-CM

## 2021-03-04 DIAGNOSIS — I5023 Acute on chronic systolic (congestive) heart failure: Secondary | ICD-10-CM | POA: Diagnosis not present

## 2021-03-04 DIAGNOSIS — R638 Other symptoms and signs concerning food and fluid intake: Secondary | ICD-10-CM

## 2021-03-04 DIAGNOSIS — Z66 Do not resuscitate: Secondary | ICD-10-CM

## 2021-03-04 DIAGNOSIS — I872 Venous insufficiency (chronic) (peripheral): Secondary | ICD-10-CM

## 2021-03-04 DIAGNOSIS — F0390 Unspecified dementia without behavioral disturbance: Secondary | ICD-10-CM

## 2021-03-04 DIAGNOSIS — N179 Acute kidney failure, unspecified: Secondary | ICD-10-CM | POA: Diagnosis not present

## 2021-03-04 DIAGNOSIS — Z789 Other specified health status: Secondary | ICD-10-CM

## 2021-03-04 DIAGNOSIS — J9601 Acute respiratory failure with hypoxia: Secondary | ICD-10-CM | POA: Diagnosis not present

## 2021-03-04 LAB — PROTEIN ELECTROPHORESIS, SERUM
A/G Ratio: 1.1 (ref 0.7–1.7)
Albumin ELP: 3.3 g/dL (ref 2.9–4.4)
Alpha-1-Globulin: 0.3 g/dL (ref 0.0–0.4)
Alpha-2-Globulin: 0.6 g/dL (ref 0.4–1.0)
Beta Globulin: 1 g/dL (ref 0.7–1.3)
Gamma Globulin: 1 g/dL (ref 0.4–1.8)
Globulin, Total: 2.9 g/dL (ref 2.2–3.9)
Total Protein ELP: 6.2 g/dL (ref 6.0–8.5)

## 2021-03-04 LAB — BASIC METABOLIC PANEL
Anion gap: 9 (ref 5–15)
BUN: 36 mg/dL — ABNORMAL HIGH (ref 8–23)
CO2: 27 mmol/L (ref 22–32)
Calcium: 8.8 mg/dL — ABNORMAL LOW (ref 8.9–10.3)
Chloride: 104 mmol/L (ref 98–111)
Creatinine, Ser: 1.65 mg/dL — ABNORMAL HIGH (ref 0.61–1.24)
GFR, Estimated: 41 mL/min — ABNORMAL LOW (ref >60)
Glucose, Bld: 100 mg/dL — ABNORMAL HIGH (ref 70–99)
Potassium: 3.4 mmol/L — ABNORMAL LOW (ref 3.5–5.1)
Sodium: 140 mmol/L (ref 135–145)

## 2021-03-04 LAB — MAGNESIUM: Magnesium: 2.1 mg/dL (ref 1.7–2.4)

## 2021-03-04 MED ORDER — IPRATROPIUM-ALBUTEROL 0.5-2.5 (3) MG/3ML IN SOLN
3.0000 mL | Freq: Four times a day (QID) | RESPIRATORY_TRACT | Status: DC
  Administered 2021-03-04: 3 mL via RESPIRATORY_TRACT
  Filled 2021-03-04: qty 3

## 2021-03-04 MED ORDER — IPRATROPIUM-ALBUTEROL 0.5-2.5 (3) MG/3ML IN SOLN
3.0000 mL | Freq: Two times a day (BID) | RESPIRATORY_TRACT | Status: DC
  Administered 2021-03-04 – 2021-03-07 (×6): 3 mL via RESPIRATORY_TRACT
  Filled 2021-03-04 (×6): qty 3

## 2021-03-04 MED ORDER — POTASSIUM CHLORIDE CRYS ER 20 MEQ PO TBCR
40.0000 meq | EXTENDED_RELEASE_TABLET | ORAL | Status: AC
  Administered 2021-03-04 (×2): 40 meq via ORAL
  Filled 2021-03-04 (×2): qty 2

## 2021-03-04 NOTE — Progress Notes (Signed)
[facility]) Hospital Liaison Note   Received request from Transitions of Care Manager, Janett Billow, for hospice services at home after discharge. Chart and patient information under review by [facility] physician. Hospice eligibility pending.   Spoke with daughter/Laura to initiate education related to hospice philosophy, services, and team approach to care. daughter/Laura verbalized understanding of information given. Per discussion, the plan is for patient to discharge home via private vehicle (may change) once cleared to DC.    DME needs discussed. Patient has the following equipment in the home (Purchased privately): none Patient requests the following equipment for delivery: Rolling walker  Hospital Bed Bedside table O2 (4L) Bedside Commode  Address verified and is correct in the chart. daughter/Laura is the family member to contact to arrange time of equipment delivery.    Please send signed and completed DNR home with patient/family. Please provide prescriptions at discharge as needed to ensure ongoing symptom management.    [facility] information and contact numbers given to family & above information shared with TOC.   Please call with any questions/concerns.    Thank you for the opportunity to participate in this patient's care.   Daphene Calamity, MSW [facility] Liaison  [phone]

## 2021-03-04 NOTE — TOC Progression Note (Signed)
Transition of Care (TOC) - Progression Note    Patient Details  Name: Samuel Moyer. MRN: [id number] Date of Birth: [birth year]  Transition of Care [facility]) CM/SW Contact  Zenon Mayo, RN Phone Number: 03/04/2021, 3:26 PM  Clinical Narrative:    Plan is for patient to go home with hospice with [facility].  Janett Billow, CSW made referral to [place] with [facility]. Patient will be transported by car per note, will reasses at time of discharge.  TOC will continue to follow.   Expected Discharge Plan: Home w Hospice Care Barriers to Discharge: Continued Medical Work up  Expected Discharge Plan and Services Expected Discharge Plan: [place]   Discharge Planning Services: CM Consult Post Acute Care Choice: Hospice Living arrangements for the past 2 months: Single Family Home                 DME Arranged:  ([facility] to supply DME)         HH Arranged: RN HH Agency:  Teacher, early years/pre) Date [place]: 03/03/21 Time HH Agency Contacted: 1500 Representative spoke with at [place]: [place] ([place]) Interventions    Readmission Risk Interventions No flowsheet data found.

## 2021-03-04 NOTE — Progress Notes (Addendum)
Daily Progress Note   Patient Name: Samuel Moyer.       Date: 03/04/2021 DOB: [birth year]  Age: 81 y.o. MRN#: [id number] Attending Physician: Kathie Dike, MD Primary Care Physician: Binnie Rail, MD Admit Date: 02/28/2021  Reason for Consultation/Follow-up: Establishing goals of care/follow up per family request  Subjective: Chart review performed. Received report from primary RN - no acute concerns. RN reports patient is more awake/alert today.   Went to visit patient at bedside - daughter present. Patient was lying in bed awake, alert, oriented, and able to participate in simple conversation. No signs or non-verbal gestures of pain or discomfort noted. No respiratory distress, increased work of breathing, or secretions noted. He denies pain or shortness of breath - states he "feels better" today. He is on 4L [place].  Daughter states family has decided to try and have patient discharged home with private duty caregivers and hospice. They have spoken to hospice liaison and are working on getting DME delivered to the house. Answered questions about DME in context of discharge. Appreciate TOC assistance in providing family with printed information. Reviewed the goals of palliative wound care.  All questions and concerns addressed. Encouraged to call with questions and/or concerns. PMT card previously provided.  Length of Stay: 3  Current Medications: Scheduled Meds:   allopurinol  300 mg Oral Daily   amLODipine  2.5 mg Oral Daily   atorvastatin  20 mg Oral Daily   dabigatran  150 mg Oral Q12H   doxycycline  100 mg Oral Q12H   empagliflozin  10 mg Oral QAC breakfast   ipratropium-albuterol  3 mL Nebulization BID   latanoprost  1 drop Both Eyes QHS   metoprolol succinate  50 mg Oral Daily    PARoxetine  10 mg Oral Daily   potassium chloride  40 mEq Oral Q4H   sacubitril-valsartan  1 tablet Oral BID   sodium chloride flush  3 mL Intravenous Q12H    Continuous Infusions:  furosemide 120 mg (03/04/21 1112)    PRN Meds: acetaminophen **OR** acetaminophen, albuterol, clonazepam, haloperidol lactate, polyethylene glycol  Physical Exam Vitals and nursing note reviewed.  Constitutional:      General: He is not in acute distress. Pulmonary:     Effort: No respiratory distress.  Skin:    General: Skin is  warm and dry.  Neurological:     Mental Status: He is alert and oriented to person, place, and time.     Motor: Weakness present.  Psychiatric:        Behavior: Behavior is cooperative.        Cognition and Memory: Cognition and memory normal.            Vital Signs: BP 117/83 (BP Location: Left Arm)    Pulse (!) 57    Temp 98 F (36.7 C) (Oral)    Resp 16    Ht 5\' 8"  (1.727 m)    Wt 92.5 kg    SpO2 94%    BMI 31.01 kg/m  SpO2: SpO2: 94 % O2 Device: O2 Device: Nasal Cannula O2 Flow Rate: O2 Flow Rate (L/min): 4 L/min  Intake/output summary:  Intake/Output Summary (Last 24 hours) at 03/04/2021 1226 Last data filed at 03/04/2021 1213 Gross per 24 hour  Intake 908.53 ml  Output 3400 ml  Net -2491.47 ml   LBM: Last BM Date: 02/28/21 Baseline Weight: Weight: 98.5 kg Most recent weight: Weight: 92.5 kg       Palliative Assessment/Data: PPS 40-50%    Flowsheet Rows    Flowsheet Row Most Recent Value  Intake Tab   Referral Department Hospitalist  Unit at Time of Referral ER  Palliative Care Primary Diagnosis Cardiac  Date Notified 03/01/21  Reason for referral Clarify Goals of Care  Date of Admission 02/28/21  Date first seen by Palliative Care 03/01/21  # of days Palliative referral response time 0 Day(s)  # of days IP prior to Palliative referral 1  Clinical Assessment   Psychosocial & Spiritual Assessment   Palliative Care Outcomes   Patient/Family  meeting held? Yes  Who was at the meeting? 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[place]) 40/98/1191   Chronic systolic heart failure ([place]) 04/13/2020   COVID 02/20/2020   Visual hallucinations 01/11/2020   Aortic atherosclerosis ([place]) 10/20/2019   Nerve disorder 10/08/2019   Coughing 04/02/2019   DOE (dyspnea on exertion) 11/01/2018   Acute pain of right knee 12/25/2017   Gout 06/03/2015   Prediabetes 03/05/2015   Venous (peripheral) insufficiency 02/16/2014   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 male  with past medical history of venous insufficiency, hallucinations/dementia, angioma, hyperlipidemia, gout, hypertension, sleep apnea on BiPAP, CHF (EF 40-45% 12/2020), A. fib, asthma, anxiety presented to the ED from home on 02/28/21 with family concerns for patient's increased shortness of breath, LE edema, and LE non-healing blisters. Patient was admitted on 02/28/2021 with CHF exacerbation, acute respiratory failure with hypoxia, LE blisters, AKI.  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 In the event of a decline, family would not want heroic measures and would be open for transition to comfort care Family do not want patient rehospitalized after discharge Continue DNR/DNI  Family have decided for patient to discharge home with hospice and private duty caregiver support. They are currently working with hospice liaison to get DME in place prior to discharge Continue palliative wound care PMT will continue to follow peripherally. If there are any imminent needs please call the service directly   Goals of Care and Additional Recommendations: Limitations on Scope of Treatment: Avoid Hospitalization, Full Scope Treatment, No Artificial Feeding, and No Tracheostomy  Code Status:    Code Status Orders  (From admission, onward)           Start     Ordered   03/03/21 1446  Do not attempt resuscitation (DNR)  Continuous       Question Answer Comment  In the event of cardiac or respiratory ARREST Do not call a code blue   In the event of cardiac or respiratory ARREST Do not perform Intubation, CPR, defibrillation or ACLS   In the event of cardiac or respiratory ARREST Use medication by any route, position, wound care, and other measures to relive pain and suffering. May use oxygen, suction and manual treatment of airway obstruction as needed for comfort.      03/03/21 1445           Code Status History     Date Active Date Inactive Code Status Order ID Comments User Context   02/28/2021 2140 03/03/2021 1445 Full Code [id number]  Marcelyn Bruins, MD ED   03/18/2020 1317 03/18/2020 2217 Full Code [id number]  [place], Peter M, MD Inpatient      Advance Directive Documentation    Flowsheet Row Most Recent Value  Type of Advance Directive Healthcare Power of Attorney, Living will   Pre-existing out of facility DNR order (yellow form or pink MOST form) --  "MOST" Form in Place? --       Prognosis:  Poor overall in the setting of advanced age, progressing dementia with behavioral disturbance, high risk for recurrent hospitalizations secondary to worsening CHF, and multiple comorbidities   Discharge Planning: Home with Hospice  Care plan was discussed with primary RN, patient, patient's daughter  Thank you for allowing the Palliative Medicine Team to assist in the care of this patient.   Total Time 30 minutes Prolonged Time Billed  no       Greater than 50%  of this time was spent counseling and coordinating care related to the above assessment and plan.  Lin Landsman, NP  Please contact Palliative Medicine Team phone at [phone] for questions and concerns.

## 2021-03-04 NOTE — Care Management Important Message (Signed)
Important Message  Patient Details  Name: Samuel Moyer. MRN: [id number] Date of Birth: [birth year]   Medicare Important Message Given:  Yes     [name] 03/04/2021, 8:42 AM

## 2021-03-04 NOTE — Progress Notes (Signed)
Progress Note  Patient Name: Samuel Moyer. Date of Encounter: 03/04/2021  [facility] Cardiologist: Chasiti Waddington [place], MD   Subjective   Patient is  alert today. Had some confusion yesterday PM. Breathing is better.  Oxygen requirement less.   Inpatient Medications    Scheduled Meds:  allopurinol  300 mg Oral Daily   amLODipine  2.5 mg Oral Daily   atorvastatin  20 mg Oral Daily   dabigatran  150 mg Oral Q12H   doxycycline  100 mg Oral Q12H   empagliflozin  10 mg Oral QAC breakfast   ipratropium-albuterol  3 mL Nebulization QID   latanoprost  1 drop Both Eyes QHS   metoprolol succinate  50 mg Oral Daily   PARoxetine  10 mg Oral Daily   sacubitril-valsartan  1 tablet Oral BID   sodium chloride flush  3 mL Intravenous Q12H   Continuous Infusions:  furosemide Stopped (03/03/21 1848)   PRN Meds: acetaminophen **OR** acetaminophen, albuterol, clonazepam, haloperidol lactate, polyethylene glycol   Vital Signs    Vitals:   03/04/21 0055 03/04/21 0300 03/04/21 0357 03/04/21 0717  BP: 121/79  118/86 135/72  Pulse: 96  (!) 50 (!) 33  Resp: _0 Temp:  97.8 F (36.6 C) 97.8 F (36.6 C) 97.6 F (36.4 C)  TempSrc:  Oral Oral Oral  SpO2: 98%  99% 98%  Weight:      Height:        Intake/Output Summary (Last 24 hours) at 03/04/2021 0835 Last data filed at 03/04/2021 0400 Gross per 24 hour  Intake 544.53 ml  Output 3550 ml  Net -3005.47 ml    Last 3 Weights 03/04/2021 03/03/2021 03/02/2021  Weight (lbs) 203 lb 14.8 oz 206 lb 5.6 oz 207 lb 14.3 oz  Weight (kg) 92.5 kg 93.6 kg 94.3 kg      Telemetry    Afib with controlled rate - Personally Reviewed  ECG    None today - Personally Reviewed  Physical Exam   GEN: elderly chronically ill appearing WM on oxygen Neck: + JVD 6 cm Cardiac: iRRR, no murmurs, rubs, or gallops.  Respiratory: decreased BS in bases GI: Soft, nontender, non-distended  MS: 1+ edema; legs now wrapped by wound care Neuro:  Nonfocal  Psych:  Normal affect   Labs    High Sensitivity Troponin:  No results for input(s): TROPONINIHS in the last 720 hours.   Chemistry Recent Labs  Lab 03/01/21 0336 03/01/21 1102 03/02/21 0143 03/03/21 0251 03/04/21 0301  NA 141   < > 139 140 140  K 3.5   < > 4.6 3.3* 3.4*  CL 107  --  105 104 104  CO2 19*  --  20* 26 27  GLUCOSE 80  --  96 100* 100*  BUN 39*  --  40* 41* 36*  CREATININE 1.89*  --  1.93* 1.83* 1.65*  CALCIUM 9.0  --  9.0 9.0 8.8*  MG 2.1  --  2.1  --  2.1  PROT 6.7  --  6.2* 6.0*  --   ALBUMIN 3.6  --  3.5 3.1*  --   AST 36  --  46* 32  --   ALT 21  --  22 19  --   ALKPHOS 98  --  94 85  --   BILITOT 2.2*  --  2.7* 1.4*  --   GFRNONAA 35*  --  34* 37* 41*  ANIONGAP 15  --  14 10  9   < > = values in this interval not displayed.     Lipids No results for input(s): CHOL, TRIG, HDL, LABVLDL, LDLCALC, CHOLHDL in the last 168 hours.  Hematology Recent Labs  Lab 03/01/21 0336 03/01/21 1102 03/02/21 0143 03/03/21 0251  WBC 7.5  --  9.4 6.6  RBC 4.13*  --  4.20* 3.82*  HGB 14.1 14.6 14.2 13.0  HCT 43.1 43.0 44.4 38.3*  MCV 104.4*  --  105.7* 100.3*  MCH 34.1*  --  33.8 34.0  MCHC 32.7  --  32.0 33.9  RDW 19.1*  --  19.5* 18.7*  PLT 135*  --  140* 147*    Thyroid  Recent Labs  Lab 03/02/21 1318  TSH 2.286     BNP Recent Labs  Lab 02/28/21 1931  BNP 4,007.4*     DDimer No results for input(s): DDIMER in the last 168 hours.   Radiology    No results found.  Cardiac Studies   Echo 01/06/2021  1. Diffuse hypokinesis wore in inferior base EF similar to that seen on  TTE 03/10/20. Left ventricular ejection fraction, by estimation, is 40 to  45%. The left ventricle has mildly decreased function. The left ventricle  has no regional wall motion  abnormalities. The left ventricular internal cavity size was mildly  dilated. Left ventricular diastolic parameters were normal.   2. Right ventricular systolic function is moderately reduced. The right   ventricular size is moderately enlarged.   3. Left atrial size was severely dilated.   4. Right atrial size was severely dilated.   5. The mitral valve is abnormal. Mild mitral valve regurgitation. No  evidence of mitral stenosis.   6. Tricuspid valve regurgitation is moderate.   7. The aortic valve is tricuspid. Aortic valve regurgitation is mild. No  aortic stenosis is present.   8. The inferior vena cava is dilated in size with >50% respiratory  variability, suggesting right atrial pressure of 8 mmHg.   Comparison(s): EF 45%, basal-mid inferior and inferolateral left  ventricular  hypokinesis, ascending aorta 60m, mild AI, mild MAC.    RIGHT/LEFT HEART CATH AND CORONARY ANGIOGRAPHY  03/18/2020    Conclusion     Prox LAD to Mid LAD lesion is 20% stenosed. Prox Cx to Mid Cx lesion is 15% stenosed. Prox RCA lesion is 30% stenosed. LV end diastolic pressure is moderately elevated. Hemodynamic findings consistent with moderate pulmonary hypertension.   1. Mild nonobstructive CAD 2. Moderately elevated LV filling pressures. PCWP and EDP 21 mm Hg.  3. Moderate pulmonary HTN with mean PAP 42 mm Hg 4. Preserved cardiac output. Index 2.4.   Plan: will intensify medical therapy. Increase lasix to 80 mg in the morning and 40 mg in the afternoon. Stop lisinopril. After 3 days will begin Entresto 49/51 mg daily. Will titrate as tolerated. Consider SGLT2 inhibitor. May resume Pradaxa this evening.      Patient Profile     81y.o. 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  Bilateral atrial enlargement. -Patient with evidence of right-sided heart failure on exam.  Elevated RSVP on echocardiogram. Weight up > 17 lbs in one month despite good medical therapy and close monthly follow up. - renal function worse -diuresing well on lasix 120 mg IV bid. Weight down 14 lbs but still not at dry weight. I/O negative > 5.6 liters -Continue Entresto  -Continue Toprol-XL 50 mg daily -Continue Jardiance - low sodium diet with fluid restriction.  - patient has high sodium diet. Wife has also been ill so he eats out or gets take out mostly. Discussed low-sodium diet   Given recent fall and worsening dementia appreciate Palliative care involvement.  -SPEP pending. PYP scan would be interesting but even if he had amyloid at this point there is little that would change his natural history.    2.  Persistent atrial fibrillation -Rate controlled -Continue beta-blocker and Pradaxa    3.  Hypertension -Blood pressure controlled -Continue current therapy   4.  Acute on chronic kidney injury -Likely due to volume overload.  Baseline creatinine was 1.2 last month.  Follow closely with diuresis. Appears better today 1.9>>1.6  5. Disposition: He needs more assistance than he has had at home. Appreciate Palliative consult to establish goals of care. Plan home hospice with assistance in home. Now DNR.        For questions or updates, please contact [place] Please consult [email or web address] for contact info under        Signed, Maximino Cozzolino [place], MD  03/04/2021, 8:35 AM

## 2021-03-04 NOTE — Plan of Care (Signed)

## 2021-03-04 NOTE — Progress Notes (Signed)
Pt placed on Bipap QHS on documented settings.  Pt tolerating well.

## 2021-03-04 NOTE — Progress Notes (Signed)
Pt placed on BIPAP for night rest tolerating well. 

## 2021-03-04 NOTE — Progress Notes (Signed)
Mobility Specialist Progress Note:   03/04/21 1508  Mobility  Activity Ambulated independently in room;Stood at bedside  Level of Assistance Moderate assist, patient does 50-74%  Assistive Device Front wheel walker  Distance Ambulated (ft) 4 ft  Activity Response Tolerated well  $Mobility charge 1 Mobility   Pt received in bed willing to participate in mobility. Complaints of lower leg pain. ModA to stand from elevated bed and was able to stand for 2 minutes then needed a seated break. Pt then completed EOB leg exercises. Pt able to stand EOB again with MinA and step toward HOB. Needed a rest break then stood one more time without any assist and was able to complete a few more steps toward HOB.Left in bed with call bell in reach, all needs met, and daughter present.   [facility] Public librarian Phone [phone] Secondary Phone [phone]

## 2021-03-04 NOTE — Progress Notes (Signed)
Progress Note   Patient: Samuel Moyer. YIR:[id number] DOB: [birth year] DOA: 02/28/2021     3 DOS: the patient was seen and examined on 03/04/2021   Brief hospital course:  81 y.o. male with medical history significant of venous insufficiency, dementia, angioma, hyperlipidemia, gout, hypertension, sleep apnea on BiPAP, CHF, A. fib, asthma, anxiety presented with worsening shortness of breath, lower extremity edema with worsening lower extremity blisters despite being placed on oral doxycycline as an outpatient by dermatology last week.  He apparently gained 20 pound in the last week or so; Lasix dose was recently increased to 80 mg twice daily.  On presentation, creatinine was 1.98, from baseline of 1.2; BNP more than 4000.  Chest x-ray showed cardiomegaly with questionable small left pleural effusion.  He was started on IV Lasix 120mg  bid. He is being followed by cardiology. Palliative care also following for goals of care.  After discussing with family, they had agreed to DNR status.  They wish to continue current treatments and upon discharge wish for patient to have hospice services follow-up.  Maintains good diuresis with stable creatinine.  Continue IV diuretics  Assessment and Plan: * Acute on chronic systolic heart failure ([place])- (present on admission) Patient admitted with decompensated CHF EF 40-45% in 12/2020 Currently on IV lasix Still has evidence of volume overload Creatinine currently stable Continue current treatments Cardiology following, appreciate assistance Continue entresto, Toprol, lasix and empagliflozin   Acute respiratory failure with hypoxia ([place])- (present on admission) Secondary to decompensated CHF Patient initially required up to 10 L of oxygen, now down to 2 L He does not wear any oxygen at home Continue to wean down oxygen as tolerated  ATRIAL FIBRILLATION - (present on admission) Permanent atrial fibrillation HR currently stable Continue current dose of  toprol He is anticoagulated with pradaxa  AKI (acute kidney injury) ([place])- (present on admission) Baseline creatinine approx 1.2 Admission creatinine 1.9 Suspect this is cardiorenal Creatinine mildly better today at 1.6 with IV lasix Continue to follow renal function and urine output  Essential hypertension- (present on admission) BP currently stable Continue on amlodipine, entresto and toprol  Hypercholesterolemia- (present on admission) Continue statin  Blisters of multiple sites- (present on admission) See venous insufficiency  Venous (peripheral) insufficiency- (present on admission) Suspect swelling and blisters in LE related to venous insufficiency and CHF Keep LE elevated Should hopefully improve with further diuresis Currently on doxycycline  Appreciate wound care input  Neurodegenerative dementia ([place])- (present on admission) Chronic dementia Family reports that patient does have some sundowning This is usually better if family member spends the night (which they intend to do)  Complex sleep apnea syndrome Continue Bipap QHS  Anxiety- (present on admission) Continue on clonazepam and paroxetine   Counseling regarding advance care planning and goals of care Palliative care following After meeting with family on 2/2, patient is now DNR Family wishes to continue current treatments in order to optimize his cardiac and respiratory status Upon discharge, they are requesting that hospice services follow patient at home If patient has a decline in the interim, they would consider transitioning to comfort measures        Subjective: says that breathing is improving. No other complaints  Physical Exam: Vitals:   03/04/21 1114 03/04/21 1614 03/04/21 2056 03/04/21 2116  BP: 117/83 108/78  125/89  Pulse: (!) 57 64  79  Resp: 16 18  19   Temp: 98 F (36.7 C) 98.3 F (36.8 C)  98.4 F (36.9 C)  TempSrc:  Oral Oral  Oral  SpO2: 94% 98% 98% 93%  Weight:       Height:       General exam: Alert, awake Respiratory system: crackles at bases. Respiratory effort normal. Cardiovascular system:RRR. No murmurs, rubs, gallops. Gastrointestinal system: Abdomen is nondistended, soft and nontender. No organomegaly or masses felt. Normal bowel sounds heard. Central nervous system: Alert and oriented. No focal neurological deficits. Extremities: pitting edema bilaterally Skin: No rashes, lesions or ulcers Psychiatry: confused   Data Reviewed:  Reviewed BMET. Ordered another BMET for AM  Family Communication: updated patient daughter Juliann Pulse at bedside  Disposition: Status is: Inpatient Remains inpatient appropriate because: continued IV diuresis          Planned Discharge Destination: Home with hospice     Time spent: 30 minutes  Author: Kathie Dike, MD 03/04/2021 10:33 PM  For on call review [email or web address].

## 2021-03-04 NOTE — Evaluation (Signed)
Physical Therapy Evaluation Patient Details Name: Samuel Moyer. MRN: [id number] DOB: 1940-06-27 Today's Date: 03/04/2021  History of Present Illness  Pt is an 81 y/o male presenting with SOB and LE edema with worsening LE blisters.  PMHx:  afib, CHF, Colitis, DM2, gout, HTN, LV dysfunction, OSA, PVC's  Clinical Impression  Pt admitted with/for CHF exacerbation and BLE swelling/blistering.  Pt needing assist of 1 person for basic mobility.  Today's session limited by mobility sessions too close together.  Pt currently limited functionally due to the problems listed below.  (see problems list.)  Pt will benefit from PT to maximize function and safety to be able to get home safely with available assist of family, PCA and or hospice services.        Recommendations for follow up therapy are one component of a multi-disciplinary discharge planning process, led by the attending physician.  Recommendations may be updated based on patient status, additional functional criteria and insurance authorization.  Follow Up Recommendations Other (comment) (Hospice Care/ No follow up if pt able to transfer and walk short distance)    Assistance Recommended at Discharge Frequent or constant Supervision/Assistance  Patient can return home with the following  A little help with walking and/or transfers;A little help with bathing/dressing/bathroom;Assistance with cooking/housework;Direct supervision/assist for medications management;Direct supervision/assist for financial management;Assist for transportation;Help with stairs or ramp for entrance    Equipment Recommendations Other (comment) (Hospice ordering equipment)  Recommendations for Other Services       Functional Status Assessment Patient has had a recent decline in their functional status and/or demonstrates limited ability to make significant improvements in function in a reasonable and predictable amount of time     Precautions / Restrictions  Precautions Precautions: Fall      Mobility  Bed Mobility Overal bed mobility: Needs Assistance Bed Mobility: Supine to Sit, Sit to Supine     Supine to sit: Min guard Sit to supine: Min guard   General bed mobility comments: pt used momentum to "power" up without assist and use of R UE to boost.    Transfers Overall transfer level: Needs assistance                 General transfer comment: pt deferred standing due to pain.  Mobility teams stood several times with this patient less than an hour before.    Ambulation/Gait                  Stairs            Wheelchair Mobility    Modified Rankin (Stroke Patients Only)       Balance Overall balance assessment: Needs assistance Sitting-balance support: No upper extremity supported, Feet supported Sitting balance-Leahy Scale: Fair Sitting balance - Comments: able to sittle with UE assist                                     Pertinent Vitals/Pain Pain Assessment Pain Assessment: Faces Faces Pain Scale: Hurts little more Pain Location: bil LE's distal to "shins" Pain Descriptors / Indicators: Burning, Grimacing, Sore Pain Intervention(s): Limited activity within patient's tolerance, Monitored during session    Home Living Family/patient expects to be discharged to:: Private residence Living Arrangements: Spouse/significant other Available Help at Discharge: Other (Comment);Personal care attendant;Family;Available 24 hours/day;Available PRN/intermittently (Family expects to hire help to assist parents and children to fill in the rest of the time.)  Type of Home: House Home Access: Stairs to enter Entrance Stairs-Rails: Right;Left Entrance Stairs-Number of Steps: 2-5 Alternate Level Stairs-Number of Steps: 1 flight Home Layout: Two level;Other (Comment) (Family intends to fix a place to live on the main level.) Home Equipment: Cane - single point Additional Comments: Palliative is  ordering RW, 3 in 1, ?    Prior Function Prior Level of Function : Needs assist             Mobility Comments: had been using a cane for very short distances up until lately ADLs Comments: TBA     Hand Dominance   Dominant Hand: Right    Extremity/Trunk Assessment   Upper Extremity Assessment Upper Extremity Assessment: Overall WFL for tasks assessed (general weaknesses)    Lower Extremity Assessment Lower Extremity Assessment: Generalized weakness       Communication   Communication: HOH  Cognition Arousal/Alertness: Awake/alert Behavior During Therapy: WFL for tasks assessed/performed Overall Cognitive Status:  (NT formally, follow commands well.)                                          General Comments General comments (skin integrity, edema, etc.): VSS on RA at rest.  Pt kept taking the [place] down, but SpO2 maintain in the mid 90's and HR in the 80's and 90's    Exercises     Assessment/Plan    PT Assessment Patient needs continued PT services  PT Problem List Decreased strength;Decreased activity tolerance;Decreased balance;Decreased mobility;Decreased knowledge of use of DME;Cardiopulmonary status limiting activity;Pain       PT Treatment Interventions DME instruction;Gait training;Functional mobility training;Therapeutic activities;Patient/family education;Balance training    PT Goals (Current goals can be found in the Care Plan section)  Acute Rehab PT Goals Patient Stated Goal: family wish pt to be able to move safely in the home as needed with available assist, but kept comfortable. PT Goal Formulation: With patient/family Time For Goal Achievement: 03/18/21 Potential to Achieve Goals: Good    Frequency Min 3X/week     Co-evaluation               AM-PAC PT "6 Clicks" Mobility  Outcome Measure Help needed turning from your back to your side while in a flat bed without using bedrails?: None Help needed moving from lying  on your back to sitting on the side of a flat bed without using bedrails?: None Help needed moving to and from a bed to a chair (including a wheelchair)?: A Lot Help needed standing up from a chair using your arms (e.g., wheelchair or bedside chair)?: A Lot Help needed to walk in hospital room?: A Lot Help needed climbing 3-5 steps with a railing? : A Lot 6 Click Score: 16    End of Session Equipment Utilized During Treatment: Oxygen Activity Tolerance: Patient tolerated treatment well;Patient limited by pain Patient left: in bed;with call bell/phone within reach;with family/visitor present Nurse Communication: Mobility status PT Visit Diagnosis: Other abnormalities of gait and mobility (R26.89);Pain Pain - part of body:  (bil LE's)    Time: 4098-1191 PT Time Calculation (min) (ACUTE ONLY): 21 min   Charges:   PT Evaluation $PT Eval Moderate Complexity: 1 Mod          03/04/2021  Ginger Carne., PT Acute Rehabilitation Services [phone]  (pager) [phone]  (office)  Tessie Fass Tayton Decaire 03/04/2021, 5:08 PM

## 2021-03-05 DIAGNOSIS — I5023 Acute on chronic systolic (congestive) heart failure: Secondary | ICD-10-CM | POA: Diagnosis not present

## 2021-03-05 DIAGNOSIS — J9601 Acute respiratory failure with hypoxia: Secondary | ICD-10-CM | POA: Diagnosis not present

## 2021-03-05 DIAGNOSIS — N179 Acute kidney failure, unspecified: Secondary | ICD-10-CM | POA: Diagnosis not present

## 2021-03-05 DIAGNOSIS — I4821 Permanent atrial fibrillation: Secondary | ICD-10-CM | POA: Diagnosis not present

## 2021-03-05 LAB — BASIC METABOLIC PANEL
Anion gap: 10 (ref 5–15)
BUN: 36 mg/dL — ABNORMAL HIGH (ref 8–23)
CO2: 26 mmol/L (ref 22–32)
Calcium: 8.9 mg/dL (ref 8.9–10.3)
Chloride: 102 mmol/L (ref 98–111)
Creatinine, Ser: 1.51 mg/dL — ABNORMAL HIGH (ref 0.61–1.24)
GFR, Estimated: 46 mL/min — ABNORMAL LOW (ref >60)
Glucose, Bld: 95 mg/dL (ref 70–99)
Potassium: 3.6 mmol/L (ref 3.5–5.1)
Sodium: 138 mmol/L (ref 135–145)

## 2021-03-05 NOTE — Progress Notes (Signed)
Progress Note   Patient: Samuel Moyer. VOH:[id number] DOB: January 28, 1941 DOA: 02/28/2021     4 DOS: the patient was seen and examined on 03/05/2021   Brief hospital course:  81 y.o. male with medical history significant of venous insufficiency, dementia, angioma, hyperlipidemia, gout, hypertension, sleep apnea on BiPAP, CHF, A. fib, asthma, anxiety presented with worsening shortness of breath, lower extremity edema with worsening lower extremity blisters despite being placed on oral doxycycline as an outpatient by dermatology last week.  He apparently gained 20 pound in the last week or so; Lasix dose was recently increased to 80 mg twice daily.  On presentation, creatinine was 1.98, from baseline of 1.2; BNP more than 4000.  Chest x-ray showed cardiomegaly with questionable small left pleural effusion.  He was started on IV Lasix 120mg  bid. He is being followed by cardiology. Palliative care also following for goals of care.  After discussing with family, they had agreed to DNR status.  They wish to continue current treatments and upon discharge wish for patient to have hospice services follow-up.  Maintains good diuresis with stable creatinine.  Continue IV diuretics  Assessment and Plan: * Acute on chronic systolic heart failure ([place])- (present on admission) Patient admitted with decompensated CHF EF 40-45% in 12/2020 Currently on IV lasix Still has evidence of volume overload Creatinine currently stable Continue current treatments Cardiology following, appreciate assistance Continue entresto, Toprol, lasix and empagliflozin   Acute respiratory failure with hypoxia ([place])- (present on admission) Secondary to decompensated CHF Patient initially required up to 10 L of oxygen, now down to 2 L He does not wear any oxygen at home Continue to wean down oxygen as tolerated  ATRIAL FIBRILLATION - (present on admission) Permanent atrial fibrillation HR currently stable Continue current dose of  toprol He is anticoagulated with pradaxa  AKI (acute kidney injury) ([place])- (present on admission) Baseline creatinine approx 1.2 Admission creatinine 1.9 Suspect this is cardiorenal Creatinine mildly better today at 1.6 with IV lasix Continue to follow renal function and urine output  Essential hypertension- (present on admission) BP currently stable Continue on amlodipine, entresto and toprol  Hypercholesterolemia- (present on admission) Continue statin  Blisters of multiple sites- (present on admission) See venous insufficiency  Venous (peripheral) insufficiency- (present on admission) Suspect swelling and blisters in LE related to venous insufficiency and CHF Keep LE elevated Should hopefully improve with further diuresis Currently on doxycycline  Appreciate wound care input  Neurodegenerative dementia ([place])- (present on admission) Chronic dementia Family reports that patient does have some sundowning This is usually better if family member spends the night (which they intend to do)  Complex sleep apnea syndrome Continue Bipap QHS  Anxiety- (present on admission) Continue on clonazepam and paroxetine   Counseling regarding advance care planning and goals of care Palliative care following After meeting with family on 2/2, patient is now DNR Family wishes to continue current treatments in order to optimize his cardiac and respiratory status Upon discharge, they are requesting that hospice services follow patient at home If patient has a decline in the interim, they would consider transitioning to comfort measures        Subjective: He denies any shortness of breath.  Physical Exam: Vitals:   03/05/21 0843 03/05/21 1017 03/05/21 1127 03/05/21 1940  BP: 135/89  120/86 110/77  Pulse: 74 60 (!) 57 77  Resp:  14 18 18   Temp:   97.8 F (36.6 C) 97.8 F (36.6 C)  TempSrc:   Oral Oral  SpO2:  94% 95% 90%  Weight:      Height:       General exam: Alert,  awake, no distress Respiratory system: Crackles at bases. Respiratory effort normal. Cardiovascular system:RRR. No murmurs, rubs, gallops. Gastrointestinal system: Abdomen is nondistended, soft and nontender. No organomegaly or masses felt. Normal bowel sounds heard. Central nervous system:No focal neurological deficits. Extremities: Improving lower extremity edema Skin: No rashes, lesions or ulcers Psychiatry: Confused, pleasant   Data Reviewed:  Reviewed basic chemistry, repeat labs ordered for a.m.  Family Communication: Discussed with son-in-law at the bedside  Disposition: Status is: Inpatient Remains inpatient appropriate because: Continued IV diuretics          Planned Discharge Destination: Home     Time spent: 35 minutes  Author: Kathie Dike, MD 03/05/2021 9:17 PM  For on call review [email or web address].

## 2021-03-05 NOTE — Progress Notes (Signed)
RT note. BIPAP QHS, patient on 2L [place] sat 95%. No labored breathing noted at this time. BIPAP is in patients room if needed later. RT will continue to monitor.

## 2021-03-05 NOTE — Plan of Care (Signed)

## 2021-03-05 NOTE — Progress Notes (Signed)
Manufacturing engineer [facility]) Hospital Liaison Note  [facility] HLT continues to follow patient for D/C planning. Per MD, he is not medically cleared to D/C at this time. [facility] staff updated.    Please call with any questions/concerns.    Thank you for the opportunity to participate in this patient's care.   Buck [facility] Liaison  [phone]

## 2021-03-05 NOTE — Progress Notes (Signed)
Progress Note  Patient Name: Samuel Moyer. Date of Encounter: 03/05/2021  [facility] Cardiologist: [name], MD   Subjective   Patient is  alert but confused. Daughter is at the bedside. He was pulling off his telemetry    Inpatient Medications    Scheduled Meds:  allopurinol  300 mg Oral Daily   amLODipine  2.5 mg Oral Daily   atorvastatin  20 mg Oral Daily   dabigatran  150 mg Oral Q12H   doxycycline  100 mg Oral Q12H   empagliflozin  10 mg Oral QAC breakfast   ipratropium-albuterol  3 mL Nebulization BID   latanoprost  1 drop Both Eyes QHS   metoprolol succinate  50 mg Oral Daily   PARoxetine  10 mg Oral Daily   sacubitril-valsartan  1 tablet Oral BID   sodium chloride flush  3 mL Intravenous Q12H   Continuous Infusions:  furosemide 120 mg (03/05/21 0849)   PRN Meds: acetaminophen **OR** acetaminophen, albuterol, clonazepam, haloperidol lactate, polyethylene glycol   Vital Signs    Vitals:   03/05/21 0800 03/05/21 0843 03/05/21 1017 03/05/21 1127  BP: 135/89 135/89  120/86  Pulse: (!) 48 74 60 (!) 57  Resp: _0 Temp:    97.8 F (36.6 C)  TempSrc:    Oral  SpO2: 95%  94% 95%  Weight:      Height:        Intake/Output Summary (Last 24 hours) at 03/05/2021 1200 Last data filed at 03/05/2021 1128 Gross per 24 hour  Intake 362.13 ml  Output 3450 ml  Net -3087.87 ml   Last 3 Weights 03/05/2021 03/04/2021 03/03/2021  Weight (lbs) 203 lb 7.8 oz 203 lb 14.8 oz 206 lb 5.6 oz  Weight (kg) 92.3 kg 92.5 kg 93.6 kg      Telemetry    Continued Afib with controlled rate - Personally Reviewed  ECG    None today - Personally Reviewed  Physical Exam   GEN: elderly chronically ill appearing WM on oxygen Neck: + JVD  Cardiac: irregularly irregular, no murmurs, rubs, or gallops.  Respiratory: Nl wob, on O2 decreased BS in bases BL GI: Soft, nontender, non-distended  MS: 1+ edema; legs now wrapped by wound care Neuro:  Nonfocal  Psych: Normal affect    Labs    High Sensitivity Troponin:  No results for input(s): TROPONINIHS in the last 720 hours.   Chemistry Recent Labs  Lab 03/01/21 0336 03/01/21 1102 03/02/21 0143 03/03/21 0251 03/04/21 0301 03/05/21 0406  NA 141   < > 139 140 140 138  K 3.5   < > 4.6 3.3* 3.4* 3.6  CL 107  --  105 104 104 102  CO2 19*  --  20* _1 GLUCOSE 80  --  96 100* 100* 95  BUN 39*  --  40* 41* 36* 36*  CREATININE 1.89*  --  1.93* 1.83* 1.65* 1.51*  CALCIUM 9.0  --  9.0 9.0 8.8* 8.9  MG 2.1  --  2.1  --  2.1  --   PROT 6.7  --  6.2* 6.0*  --   --   ALBUMIN 3.6  --  3.5 3.1*  --   --   AST 36  --  46* 32  --   --   ALT 21  --  22 19  --   --   ALKPHOS 98  --  94 85  --   --  BILITOT 2.2*  --  2.7* 1.4*  --   --   GFRNONAA 35*  --  34* 37* 41* 46*  ANIONGAP 15  --  _0 < > = values in this interval not displayed.    Lipids No results for input(s): CHOL, TRIG, HDL, LABVLDL, LDLCALC, CHOLHDL in the last 168 hours.  Hematology Recent Labs  Lab 03/01/21 0336 03/01/21 1102 03/02/21 0143 03/03/21 0251  WBC 7.5  --  9.4 6.6  RBC 4.13*  --  4.20* 3.82*  HGB 14.1 14.6 14.2 13.0  HCT 43.1 43.0 44.4 38.3*  MCV 104.4*  --  105.7* 100.3*  MCH 34.1*  --  33.8 34.0  MCHC 32.7  --  32.0 33.9  RDW 19.1*  --  19.5* 18.7*  PLT 135*  --  140* 147*   Thyroid  Recent Labs  Lab 03/02/21 1318  TSH 2.286    BNP Recent Labs  Lab 02/28/21 1931  BNP 4,007.4*    DDimer No results for input(s): DDIMER in the last 168 hours.   Radiology    No results found.  Cardiac Studies   Echo 01/06/2021  1. Diffuse hypokinesis wore in inferior base EF similar to that seen on  TTE 03/10/20. Left ventricular ejection fraction, by estimation, is 40 to  45%. The left ventricle has mildly decreased function. The left ventricle  has no regional wall motion  abnormalities. The left ventricular internal cavity size was mildly  dilated. Left ventricular diastolic parameters were normal.   2. Right  ventricular systolic function is moderately reduced. The right  ventricular size is moderately enlarged.   3. Left atrial size was severely dilated.   4. Right atrial size was severely dilated.   5. The mitral valve is abnormal. Mild mitral valve regurgitation. No  evidence of mitral stenosis.   6. Tricuspid valve regurgitation is moderate.   7. The aortic valve is tricuspid. Aortic valve regurgitation is mild. No  aortic stenosis is present.   8. The inferior vena cava is dilated in size with >50% respiratory  variability, suggesting right atrial pressure of 8 mmHg.   Comparison(s): EF 45%, basal-mid inferior and inferolateral left  ventricular  hypokinesis, ascending aorta 62m, mild AI, mild MAC.    RIGHT/LEFT HEART CATH AND CORONARY ANGIOGRAPHY  03/18/2020    Conclusion     Prox LAD to Mid LAD lesion is 20% stenosed. Prox Cx to Mid Cx lesion is 15% stenosed. Prox RCA lesion is 30% stenosed. LV end diastolic pressure is moderately elevated. Hemodynamic findings consistent with moderate pulmonary hypertension.   1. Mild nonobstructive CAD 2. Moderately elevated LV filling pressures. PCWP and EDP 21 mm Hg.  3. Moderate pulmonary HTN with mean PAP 42 mm Hg 4. Preserved cardiac output. Index 2.4.   Plan: will intensify medical therapy. Increase lasix to 80 mg in the morning and 40 mg in the afternoon. Stop lisinopril. After 3 days will begin Entresto 49/51 mg daily. Will titrate as tolerated. Consider SGLT2 inhibitor. May resume Pradaxa this evening.      Patient Profile     81y.o. 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 AStarla Link  Assessment & Plan    Acute on Chronic combined heart failure with BiV failure - Last echocardiogram January 06, 2021 showed LV function of 40 to 45% with diffuse hypokinesis and inferior basal which appears similar to prior echocardiogram.  Bilateral atrial enlargement. -Patient with evidence of right-sided heart failure on exam.  Elevated RSVP on echocardiogram. Weight up > 17 lbs in one month despite good medical therapy and close monthly follow up. -continue on lasix 120 mg IV bid. Weight down 14 lbs but still not at dry weight. Net negative 2L  -Continue Entresto  -Continue Toprol-XL 50 mg daily -Continue Jardiance - low sodium diet with fluid restriction.  - patient has high sodium diet. Wife has also been ill so he eats out or gets take out mostly. Discussed low-sodium diet   Given recent fall and worsening dementia appreciate Palliative care involvement.  -SPEP pending. PYP scan would be interesting but even if he had amyloid at this point there is little that would change his natural history.    2.  Persistent atrial fibrillation - Ok to be off tele -Rate controlled -Continue beta-blocker and Pradaxa    3.  Hypertension -Blood pressure controlled -Continue current therapy   4.  Acute on chronic kidney injury -Likely due to volume overload.  Baseline creatinine was 1.2 last month. Crt stable 1.5 up to 1.98 on admission  5. Disposition: He needs more assistance than he has had at home. Appreciate Palliative consult to establish goals of care. Plan home hospice with assistance in home. Now DNR.        For questions or updates, please contact [place] Please consult [email or web address] for contact info under        Signed, Janina Mayo, MD  03/05/2021, 12:00 PM

## 2021-03-06 DIAGNOSIS — I4821 Permanent atrial fibrillation: Secondary | ICD-10-CM | POA: Diagnosis not present

## 2021-03-06 DIAGNOSIS — N179 Acute kidney failure, unspecified: Secondary | ICD-10-CM | POA: Diagnosis not present

## 2021-03-06 DIAGNOSIS — J9601 Acute respiratory failure with hypoxia: Secondary | ICD-10-CM | POA: Diagnosis not present

## 2021-03-06 DIAGNOSIS — I5023 Acute on chronic systolic (congestive) heart failure: Secondary | ICD-10-CM | POA: Diagnosis not present

## 2021-03-06 LAB — BASIC METABOLIC PANEL
Anion gap: 9 (ref 5–15)
BUN: 33 mg/dL — ABNORMAL HIGH (ref 8–23)
CO2: 28 mmol/L (ref 22–32)
Calcium: 8.9 mg/dL (ref 8.9–10.3)
Chloride: 102 mmol/L (ref 98–111)
Creatinine, Ser: 1.61 mg/dL — ABNORMAL HIGH (ref 0.61–1.24)
GFR, Estimated: 43 mL/min — ABNORMAL LOW (ref >60)
Glucose, Bld: 125 mg/dL — ABNORMAL HIGH (ref 70–99)
Potassium: 3.7 mmol/L (ref 3.5–5.1)
Sodium: 139 mmol/L (ref 135–145)

## 2021-03-06 NOTE — Progress Notes (Signed)
Progress Note   Patient: Samuel Moyer. UXL:[id number] DOB: February 10, 1940 DOA: 02/28/2021     5 DOS: the patient was seen and examined on 03/06/2021   Brief hospital course:  81 y.o. male with medical history significant of venous insufficiency, dementia, angioma, hyperlipidemia, gout, hypertension, sleep apnea on BiPAP, CHF, A. fib, asthma, anxiety presented with worsening shortness of breath, lower extremity edema with worsening lower extremity blisters despite being placed on oral doxycycline as an outpatient by dermatology last week.  He apparently gained 20 pound in the last week or so; Lasix dose was recently increased to 80 mg twice daily.  On presentation, creatinine was 1.98, from baseline of 1.2; BNP more than 4000.  Chest x-ray showed cardiomegaly with questionable small left pleural effusion.  He was started on IV Lasix 120mg  bid. He is being followed by cardiology. Palliative care also following for goals of care.  After discussing with family, they had agreed to DNR status.  They wish to continue current treatments and upon discharge wish for patient to have hospice services follow-up.  Maintains good diuresis with stable creatinine.  Continue IV diuretics  Assessment and Plan: * Acute on chronic systolic heart failure ([place])- (present on admission) Patient admitted with decompensated CHF EF 40-45% in 12/2020 Currently on IV lasix Still has evidence of volume overload Creatinine currently stable Continue current treatments Cardiology following, appreciate assistance Continue entresto, Toprol, lasix and empagliflozin   Acute respiratory failure with hypoxia ([place])- (present on admission) Secondary to decompensated CHF Patient initially required up to 10 L of oxygen, now down to 2 L He does not wear any oxygen at home Continue to wean down oxygen as tolerated  ATRIAL FIBRILLATION - (present on admission) Permanent atrial fibrillation HR currently stable Continue current dose of  toprol He is anticoagulated with pradaxa  AKI (acute kidney injury) ([place])- (present on admission) Baseline creatinine approx 1.2 Admission creatinine 1.9 Suspect this is cardiorenal Creatinine stable today at 1.6 with IV lasix, BUN trending down Continue to follow renal function and urine output  Essential hypertension- (present on admission) BP currently stable Continue on amlodipine, entresto and toprol  Hypercholesterolemia- (present on admission) Continue statin  Blisters of multiple sites- (present on admission) See venous insufficiency  Venous (peripheral) insufficiency- (present on admission) Suspect swelling and blisters in LE related to venous insufficiency and CHF Keep LE elevated Should hopefully improve with further diuresis Currently on doxycycline  Appreciate wound care input  Neurodegenerative dementia ([place])- (present on admission) Chronic dementia Family reports that patient does have some sundowning This is usually better if family member spends the night (which they intend to do)  Complex sleep apnea syndrome Continue Bipap QHS  Anxiety- (present on admission) Continue on clonazepam and paroxetine   Counseling regarding advance care planning and goals of care Palliative care following After meeting with family on 2/2, patient is now DNR Family wishes to continue current treatments in order to optimize his cardiac and respiratory status Upon discharge, they are requesting that hospice services follow patient at home Currently they awaiting equipment to be delivered to home tomorrow They are also in the process of arranging private nursing care at home        Subjective: he is feeling better today. Shortness of breath improving.   Physical Exam: Vitals:   03/05/21 1940 03/06/21 0415 03/06/21 0729 03/06/21 1156  BP: 110/77 (!) 137/98 (!) 132/91 106/73  Pulse: 77 78 73 64  Resp: 18 18 18 19   Temp: 97.8 F (  36.6 C) 98 F (36.7 C) 98.2 F  (36.8 C) (!) 97.4 F (36.3 C)  TempSrc: Oral Axillary Oral Oral  SpO2: 90% 93% 93% 100%  Weight:  94.2 kg    Height:       General exam: Alert, awake, no distress Respiratory system: crackles at bases. Respiratory effort normal. Cardiovascular system:RRR. No murmurs, rubs, gallops. Gastrointestinal system: Abdomen is nondistended, soft and nontender. No organomegaly or masses felt. Normal bowel sounds heard. Central nervous system:  No focal neurological deficits. Extremities: 1+ edema b/l. Both legs are wrapped in dressings Skin: No rashes, lesions or ulcers Psychiatry: pleasant, calm   Data Reviewed:  Reviewed chemistry panel today, repeat  chemistry ordered for AM  Family Communication: discussed with daughter at the bedside  Disposition: Status is: Inpatient Remains inpatient appropriate because: continued IV diuresis          Planned Discharge Destination: Home     Time spent: 35 minutes  Author: Kathie Dike, MD 03/06/2021 11:56 AM  For on call review [email or web address].

## 2021-03-06 NOTE — Plan of Care (Signed)

## 2021-03-06 NOTE — Progress Notes (Addendum)
Progress Note  Patient Name: Samuel Moyer. Date of Encounter: 03/06/2021  [facility] Cardiologist: [name], MD   Subjective   Patient is  alert. He's less confused today. His son is by the bedside. He has no issues  Inpatient Medications    Scheduled Meds:  allopurinol  300 mg Oral Daily   amLODipine  2.5 mg Oral Daily   atorvastatin  20 mg Oral Daily   dabigatran  150 mg Oral Q12H   doxycycline  100 mg Oral Q12H   empagliflozin  10 mg Oral QAC breakfast   ipratropium-albuterol  3 mL Nebulization BID   latanoprost  1 drop Both Eyes QHS   metoprolol succinate  50 mg Oral Daily   PARoxetine  10 mg Oral Daily   sacubitril-valsartan  1 tablet Oral BID   sodium chloride flush  3 mL Intravenous Q12H   Continuous Infusions:  furosemide 120 mg (03/06/21 0820)   PRN Meds: acetaminophen **OR** acetaminophen, albuterol, clonazepam, haloperidol lactate, polyethylene glycol   Vital Signs    Vitals:   03/05/21 1127 03/05/21 1940 03/06/21 0415 03/06/21 0729  BP: 120/86 110/77 (!) 137/98 (!) 132/91  Pulse: (!) 57 77 78 73  Resp: _0 Temp: 97.8 F (36.6 C) 97.8 F (36.6 C) 98 F (36.7 C) 98.2 F (36.8 C)  TempSrc: Oral Oral Axillary Oral  SpO2: 95% 90% 93% 93%  Weight:   94.2 kg   Height:        Intake/Output Summary (Last 24 hours) at 03/06/2021 0915 Last data filed at 03/06/2021 0730 Gross per 24 hour  Intake 546 ml  Output 2000 ml  Net -1454 ml   Last 3 Weights 03/06/2021 03/05/2021 03/04/2021  Weight (lbs) 207 lb 10.8 oz 203 lb 7.8 oz 203 lb 14.8 oz  Weight (kg) 94.2 kg 92.3 kg 92.5 kg      Telemetry    No tele- Personally Reviewed  ECG    None today - Personally Reviewed  Physical Exam   GEN: elderly chronically ill appearing WM on oxygen Neck: + JVD  Cardiac: irregularly irregular, no murmurs, rubs, or gallops.  Respiratory: Nl wob, on O2 decreased BS in bases BL GI: Soft, nontender, non-distended  MS: 1+ edema; legs now wrapped by wound  care Neuro:  Nonfocal  Psych: Normal affect   Labs    High Sensitivity Troponin:  No results for input(s): TROPONINIHS in the last 720 hours.   Chemistry Recent Labs  Lab 03/01/21 0336 03/01/21 1102 03/02/21 0143 03/03/21 0251 03/04/21 0301 03/05/21 0406  NA 141   < > 139 140 140 138  K 3.5   < > 4.6 3.3* 3.4* 3.6  CL 107  --  105 104 104 102  CO2 19*  --  20* _1 GLUCOSE 80  --  96 100* 100* 95  BUN 39*  --  40* 41* 36* 36*  CREATININE 1.89*  --  1.93* 1.83* 1.65* 1.51*  CALCIUM 9.0  --  9.0 9.0 8.8* 8.9  MG 2.1  --  2.1  --  2.1  --   PROT 6.7  --  6.2* 6.0*  --   --   ALBUMIN 3.6  --  3.5 3.1*  --   --   AST 36  --  46* 32  --   --   ALT 21  --  22 19  --   --   ALKPHOS 98  --  94  85  --   --   BILITOT 2.2*  --  2.7* 1.4*  --   --   GFRNONAA 35*  --  34* 37* 41* 46*  ANIONGAP 15  --  _0 < > = values in this interval not displayed.    Lipids No results for input(s): CHOL, TRIG, HDL, LABVLDL, LDLCALC, CHOLHDL in the last 168 hours.  Hematology Recent Labs  Lab 03/01/21 0336 03/01/21 1102 03/02/21 0143 03/03/21 0251  WBC 7.5  --  9.4 6.6  RBC 4.13*  --  4.20* 3.82*  HGB 14.1 14.6 14.2 13.0  HCT 43.1 43.0 44.4 38.3*  MCV 104.4*  --  105.7* 100.3*  MCH 34.1*  --  33.8 34.0  MCHC 32.7  --  32.0 33.9  RDW 19.1*  --  19.5* 18.7*  PLT 135*  --  140* 147*   Thyroid  Recent Labs  Lab 03/02/21 1318  TSH 2.286    BNP Recent Labs  Lab 02/28/21 1931  BNP 4,007.4*    DDimer No results for input(s): DDIMER in the last 168 hours.   Radiology    No results found.  Cardiac Studies   Echo 01/06/2021  1. Diffuse hypokinesis wore in inferior base EF similar to that seen on  TTE 03/10/20. Left ventricular ejection fraction, by estimation, is 40 to  45%. The left ventricle has mildly decreased function. The left ventricle  has no regional wall motion  abnormalities. The left ventricular internal cavity size was mildly  dilated. Left ventricular  diastolic parameters were normal.   2. Right ventricular systolic function is moderately reduced. The right  ventricular size is moderately enlarged.   3. Left atrial size was severely dilated.   4. Right atrial size was severely dilated.   5. The mitral valve is abnormal. Mild mitral valve regurgitation. No  evidence of mitral stenosis.   6. Tricuspid valve regurgitation is moderate.   7. The aortic valve is tricuspid. Aortic valve regurgitation is mild. No  aortic stenosis is present.   8. The inferior vena cava is dilated in size with >50% respiratory  variability, suggesting right atrial pressure of 8 mmHg.   Comparison(s): EF 45%, basal-mid inferior and inferolateral left  ventricular  hypokinesis, ascending aorta 38m, mild AI, mild MAC.    RIGHT/LEFT HEART CATH AND CORONARY ANGIOGRAPHY  03/18/2020    Conclusion     Prox LAD to Mid LAD lesion is 20% stenosed. Prox Cx to Mid Cx lesion is 15% stenosed. Prox RCA lesion is 30% stenosed. LV end diastolic pressure is moderately elevated. Hemodynamic findings consistent with moderate pulmonary hypertension.   1. Mild nonobstructive CAD 2. Moderately elevated LV filling pressures. PCWP and EDP 21 mm Hg.  3. Moderate pulmonary HTN with mean PAP 42 mm Hg 4. Preserved cardiac output. Index 2.4.   Plan: will intensify medical therapy. Increase lasix to 80 mg in the morning and 40 mg in the afternoon. Stop lisinopril. After 3 days will begin Entresto 49/51 mg daily. Will titrate as tolerated. Consider SGLT2 inhibitor. May resume Pradaxa this evening.      Patient Profile     81y.o. 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 Starla Link.  Assessment & Plan    Acute on Chronic combined heart failure with BiV failure - Last echocardiogram January 06, 2021 showed LV function of 40 to 45% with diffuse hypokinesis and inferior basal which appears similar to prior echocardiogram.  Bilateral atrial enlargement. -Patient with evidence of right-sided heart failure on exam.  Elevated RSVP on echocardiogram. Weight up > 17 lbs in one month despite good medical therapy and close monthly follow up. -continue on lasix 120 mg IV BID .  Net negative 1.1L. Crt stable , improving from 1.98. 1.51  Wt Readings from Last 3 Encounters:  03/06/21 94.2 kg  02/23/21 98.5 kg  01/11/21 87.4 kg     -Continue Entresto  -Continue Toprol-XL 50 mg daily -Continue Jardiance - low sodium diet with fluid restriction.  - patient has high sodium diet. Wife has also been ill so he eats out or gets take out mostly. Discussed low-sodium diet   Given recent fall and worsening dementia appreciate Palliative care involvement.  -SPEP pending. PYP scan was considered but even if he had amyloid at this point there is little that would change his natural history.    2.  Persistent atrial fibrillation - Ok to be off tele -Rate controlled -Continue beta-blocker and Pradaxa    3.  Hypertension -Blood pressure controlled -Continue current therapy   4.  Acute on chronic kidney injury -Likely due to volume overload.  Baseline creatinine was 1.2 last month. Crt stable 1.5 up to 1.98 on admission  5. Disposition: He needs more assistance than he has had at home. Appreciate Palliative consult to establish goals of care. Plan home hospice with assistance in home. Now DNR.        For questions or updates, please contact [place] Please consult [email or web address] for contact info under        Signed, Janina Mayo, MD  03/06/2021, 9:15 AM

## 2021-03-06 NOTE — Progress Notes (Signed)
Manufacturing engineer [facility]) Hospital Liaison Note   [facility] HLT continues to follow patient for D/C planning. Hospice eligibility has been approved. Per MD, he is not medically cleared to D/C at this time. Please call with any questions/concerns.    Thank you for the opportunity to participate in this patient's care.  Clementeen Hoof, BSN, [employer] [phone]

## 2021-03-07 DIAGNOSIS — I4821 Permanent atrial fibrillation: Secondary | ICD-10-CM | POA: Diagnosis not present

## 2021-03-07 DIAGNOSIS — I5023 Acute on chronic systolic (congestive) heart failure: Secondary | ICD-10-CM | POA: Diagnosis not present

## 2021-03-07 LAB — BASIC METABOLIC PANEL
Anion gap: 12 (ref 5–15)
BUN: 34 mg/dL — ABNORMAL HIGH (ref 8–23)
CO2: 30 mmol/L (ref 22–32)
Calcium: 8.9 mg/dL (ref 8.9–10.3)
Chloride: 97 mmol/L — ABNORMAL LOW (ref 98–111)
Creatinine, Ser: 1.45 mg/dL — ABNORMAL HIGH (ref 0.61–1.24)
GFR, Estimated: 48 mL/min — ABNORMAL LOW (ref >60)
Glucose, Bld: 152 mg/dL — ABNORMAL HIGH (ref 70–99)
Potassium: 3.6 mmol/L (ref 3.5–5.1)
Sodium: 139 mmol/L (ref 135–145)

## 2021-03-07 NOTE — TOC Progression Note (Signed)
Transition of Care (TOC) - Progression Note    Patient Details  Name: Davidlee Jeanbaptiste. MRN: [id number] Date of Birth: 16-Nov-1940  Transition of [facility]) CM/SW Contact  Zenon Mayo, RN Phone Number: 03/07/2021, 4:11 PM  Clinical Narrative:    Per MD , they still need to diurese patient, and per daughter the DME should be delivered at 5 pm today and she will be meeting with comfort keepers in the am for an assessment.  So may be ready by Wed for dc.     Expected Discharge Plan: Home w Hospice Care Barriers to Discharge: Continued Medical Work up  Expected Discharge Plan and Services Expected Discharge Plan: [place]   Discharge Planning Services: CM Consult Post Acute Care Choice: Hospice Living arrangements for the past 2 months: Single Family Home                 DME Arranged:  ([facility] to supply DME)         HH Arranged: RN HH Agency:  Teacher, early years/pre) Date [place]: 03/03/21 Time HH Agency Contacted: 1500 Representative spoke with at [place]: [place] ([place]) Interventions    Readmission Risk Interventions No flowsheet data found.

## 2021-03-07 NOTE — Progress Notes (Signed)
Physical Therapy Treatment Patient Details Name: Samuel Moyer. MRN: [id number] DOB: [birth year] Today's Date: 03/07/2021   History of Present Illness Pt is an 81 y/o male presenting with SOB and LE edema with worsening LE blisters.  PMHx:  afib, CHF, Colitis, DM2, gout, HTN, LV dysfunction, OSA, PVC's    PT Comments    The pt was able to progress to complete OOB mobility this session. He was soiled of urine upon my arrival and therefore agreeable to OOB mobility to get cleaned up. He completed multiple sit-stand transfers and pivot transfer from bed-BSC and then walk 4-5 feet from [facility] to recliner with RW and min-modA. He requires increased assist from low surface or without arm rests and modA in standing due to strong posterior lean. Will continue to benefit from skilled PT to attain family goals of pt being able to ambulate short household distances.    Recommendations for follow up therapy are one component of a multi-disciplinary discharge planning process, led by the attending physician.  Recommendations may be updated based on patient status, additional functional criteria and insurance authorization.  Follow Up Recommendations  Other (comment) (Hospice Care/ No follow up if pt able to transfer and walk short distance)     Assistance Recommended at Discharge Frequent or constant Supervision/Assistance  Patient can return home with the following A little help with walking and/or transfers;A little help with bathing/dressing/bathroom;Assistance with cooking/housework;Direct supervision/assist for medications management;Direct supervision/assist for financial management;Assist for transportation;Help with stairs or ramp for entrance   Equipment Recommendations  Other (comment) (hospice ordering equipment)    Recommendations for Other Services       Precautions / Restrictions Precautions Precautions: Fall Precaution Comments: on 1L O2 for session Restrictions Weight Bearing  Restrictions: No     Mobility  Bed Mobility Overal bed mobility: Needs Assistance Bed Mobility: Supine to Sit     Supine to sit: Min assist     General bed mobility comments: minA with increased time. pt rolling well with use of bed rails    Transfers Overall transfer level: Needs assistance Equipment used: Rolling walker (2 wheels) Transfers: Sit to/from Stand, Bed to chair/wheelchair/BSC Sit to Stand: Min assist Stand pivot transfers: Mod assist         General transfer comment: pt able to complete with modA from low surface and minA from [facility] (improved with use of arm rests). significant time to power up from all surfaces and heavily dependent on BUE support. posterior lean upon standing    Ambulation/Gait Ambulation/Gait assistance: Mod assist Gait Distance (Feet): 4 Feet Assistive device: Rolling walker (2 wheels) Gait Pattern/deviations: Step-to pattern, Decreased stride length Gait velocity: decreased Gait velocity interpretation: <1.31 ft/sec, indicative of household ambulator   General Gait Details: modA to steady with small steps, minimal clearance. pt maintains posterior lean      Balance Overall balance assessment: Needs assistance Sitting-balance support: No upper extremity supported, Feet supported Sitting balance-Leahy Scale: Fair Sitting balance - Comments: able to sittle with UE assist   Standing balance support: Bilateral upper extremity supported, Reliant on assistive device for balance Standing balance-Leahy Scale: Poor Standing balance comment: dependent on BUE support for gait                            Cognition Arousal/Alertness: Awake/alert Behavior During Therapy: WFL for tasks assessed/performed Overall Cognitive Status: No family/caregiver present to determine baseline cognitive functioning  General Comments: pt able to follow simple cues with increased time. incontinent of  urine upon arrival and able to state this to me but had not alerted staff. States he has been confused on what day it is but family has helped to orient        Exercises      General Comments General comments (skin integrity, edema, etc.): SpO2 88%on RA, on 1L for session      Pertinent Vitals/Pain Pain Assessment Pain Assessment: Faces Faces Pain Scale: Hurts little more Pain Location: bil LE's distal to "shins" Pain Descriptors / Indicators: Burning, Grimacing, Sore Pain Intervention(s): Limited activity within patient's tolerance, Monitored during session, Repositioned     PT Goals (current goals can now be found in the care plan section) Acute Rehab PT Goals Patient Stated Goal: family wish pt to be able to move safely in the home as needed with available assist, but kept comfortable. PT Goal Formulation: With patient/family Time For Goal Achievement: 03/18/21 Potential to Achieve Goals: Good Progress towards PT goals: Progressing toward goals    Frequency    Min 3X/week      PT Plan Current plan remains appropriate       AM-PAC PT "6 Clicks" Mobility   Outcome Measure  Help needed turning from your back to your side while in a flat bed without using bedrails?: A Little Help needed moving from lying on your back to sitting on the side of a flat bed without using bedrails?: A Little Help needed moving to and from a bed to a chair (including a wheelchair)?: A Lot Help needed standing up from a chair using your arms (e.g., wheelchair or bedside chair)?: A Lot Help needed to walk in hospital room?: Total Help needed climbing 3-5 steps with a railing? : Total 6 Click Score: 12    End of Session Equipment Utilized During Treatment: Gait belt;Oxygen Activity Tolerance: Patient tolerated treatment well;Patient limited by pain Patient left: in chair;with call bell/phone within reach;with nursing/sitter in room Nurse Communication: Mobility status PT Visit Diagnosis:  Other abnormalities of gait and mobility (R26.89);Pain     Time: 7078-6754 PT Time Calculation (min) (ACUTE ONLY): 36 min  Charges:  $Therapeutic Exercise: 23-37 mins                     West Carbo, PT, DPT   Acute Rehabilitation Department Pager #: [phone]   Sandra Cockayne 03/07/2021, 3:42 PM

## 2021-03-07 NOTE — Progress Notes (Signed)
[employer]ufacturing [employer] [facility]) Hospital Liaison Note  [facility] HLT continues to follow patient for D/C planning. Hospice eligibility has been approved. Per MD, he is not medically cleared to D/C at this time. Please call with any questions/concerns.    Thank you for the opportunity to participate in this patient's care.  Buck Mam [facility] Liaison  [phone]

## 2021-03-07 NOTE — Progress Notes (Signed)
Progress Note  Patient Name: Samuel Moyer. Date of Encounter: 03/07/2021  [facility] Cardiologist: [name], MD   Subjective   Feels ok. Wants to get out of bed today.   Inpatient Medications    Scheduled Meds:  allopurinol  300 mg Oral Daily   amLODipine  2.5 mg Oral Daily   atorvastatin  20 mg Oral Daily   dabigatran  150 mg Oral Q12H   doxycycline  100 mg Oral Q12H   empagliflozin  10 mg Oral QAC breakfast   ipratropium-albuterol  3 mL Nebulization BID   latanoprost  1 drop Both Eyes QHS   metoprolol succinate  50 mg Oral Daily   PARoxetine  10 mg Oral Daily   sacubitril-valsartan  1 tablet Oral BID   sodium chloride flush  3 mL Intravenous Q12H   Continuous Infusions:  furosemide 120 mg (03/07/21 1722)   PRN Meds: acetaminophen **OR** acetaminophen, albuterol, clonazepam, haloperidol lactate, polyethylene glycol   Vital Signs    Vitals:   03/06/21 2002 03/07/21 0422 03/07/21 0826 03/07/21 0908  BP:  (!) 124/59 120/88   Pulse:  86 62   Resp:  18 18   Temp:  97.8 F (36.6 C)    TempSrc:  Axillary    SpO2: 94% 92% 92% 97%  Weight:  91.7 kg    Height:        Intake/Output Summary (Last 24 hours) at 03/07/2021 1802 Last data filed at 03/07/2021 0839 Gross per 24 hour  Intake 557.34 ml  Output 400 ml  Net 157.34 ml   Last 3 Weights 03/07/2021 03/06/2021 03/05/2021  Weight (lbs) 202 lb 2.6 oz 207 lb 10.8 oz 203 lb 7.8 oz  Weight (kg) 91.7 kg 94.2 kg 92.3 kg      Telemetry    No tele - Personally Reviewed  ECG      Physical Exam   GEN: No acute distress.  frail Neck: No JVD Cardiac: irregularly irregular, no murmurs, rubs, or gallops.  Respiratory: Clear to auscultation bilaterally. GI: Soft, nontender, non-distended  MS: No edema; No deformity. Legs wrapped Neuro:  Nonfocal  Psych: Normal affect   Labs    High Sensitivity Troponin:  No results for input(s): TROPONINIHS in the last 720 hours.   Chemistry Recent Labs  Lab 03/01/21 0336  03/01/21 1102 03/02/21 0143 03/03/21 0251 03/04/21 0301 03/05/21 0406 03/06/21 1021 03/07/21 1320  NA 141   < > 139 140 140 138 139 139  K 3.5   < > 4.6 3.3* 3.4* 3.6 3.7 3.6  CL 107  --  105 104 104 102 102 97*  CO2 19*  --  20* 26 27 26 28 30   GLUCOSE 80  --  96 100* 100* 95 125* 152*  BUN 39*  --  40* 41* 36* 36* 33* 34*  CREATININE 1.89*  --  1.93* 1.83* 1.65* 1.51* 1.61* 1.45*  CALCIUM 9.0  --  9.0 9.0 8.8* 8.9 8.9 8.9  MG 2.1  --  2.1  --  2.1  --   --   --   PROT 6.7  --  6.2* 6.0*  --   --   --   --   ALBUMIN 3.6  --  3.5 3.1*  --   --   --   --   AST 36  --  46* 32  --   --   --   --   ALT 21  --  22 19  --   --   --   --  ALKPHOS 98  --  94 85  --   --   --   --   BILITOT 2.2*  --  2.7* 1.4*  --   --   --   --   GFRNONAA 35*  --  34* 37* 41* 46* 43* 48*  ANIONGAP 15  --  14 10 9 10 9 12    < > = values in this interval not displayed.    Lipids No results for input(s): CHOL, TRIG, HDL, LABVLDL, LDLCALC, CHOLHDL in the last 168 hours.  Hematology Recent Labs  Lab 03/01/21 0336 03/01/21 1102 03/02/21 0143 03/03/21 0251  WBC 7.5  --  9.4 6.6  RBC 4.13*  --  4.20* 3.82*  HGB 14.1 14.6 14.2 13.0  HCT 43.1 43.0 44.4 38.3*  MCV 104.4*  --  105.7* 100.3*  MCH 34.1*  --  33.8 34.0  MCHC 32.7  --  32.0 33.9  RDW 19.1*  --  19.5* 18.7*  PLT 135*  --  140* 147*   Thyroid  Recent Labs  Lab 03/02/21 1318  TSH 2.286    BNP Recent Labs  Lab 02/28/21 1931  BNP 4,007.4*    DDimer No results for input(s): DDIMER in the last 168 hours.   Radiology    No results found.  Cardiac Studies   Nonobstructive CAD in 2022; EF 40-45% in 12/2020  Patient Profile     81 y.o. male biventricular failure  Assessment & Plan    Medical therapy for CHF.   He appears comfortable from a respiratory standpoint.  Renal function improving.  AFib, rate controlled.  Anticoagulation for stroke prevention.  PLanning for hospice.  No changes      For questions or updates,  please contact [place] Please consult [email or web address] for contact info under        Signed, Larae Grooms, MD  03/07/2021, 6:02 PM

## 2021-03-07 NOTE — Care Management Important Message (Signed)
Important Message  Patient Details  Name: Samuel Moyer. MRN: [id number] Date of Birth: 14-Apr-1940   Medicare Important Message Given:  Yes     [name] 03/07/2021, 9:53 AM

## 2021-03-07 NOTE — Progress Notes (Signed)
Progress Note   Patient: Samuel Moyer. URK:[id number] DOB: 30-Apr-1940 DOA: 02/28/2021     6 DOS: the patient was seen and examined on 03/07/2021   Brief hospital course:  81 y.o. male with medical history significant of venous insufficiency, dementia, angioma, hyperlipidemia, gout, hypertension, sleep apnea on BiPAP, CHF, A. fib, asthma, anxiety presented with worsening shortness of breath, lower extremity edema with worsening lower extremity blisters despite being placed on oral doxycycline as an outpatient by dermatology last week.  He apparently gained 20 pound in the last week or so; Lasix dose was recently increased to 80 mg twice daily.  On presentation, creatinine was 1.98, from baseline of 1.2; BNP more than 4000.  Chest x-ray showed cardiomegaly with questionable small left pleural effusion.  He was started on IV Lasix 120mg  bid. He is being followed by cardiology. Palliative care also following for goals of care.  After discussing with family, they had agreed to DNR status.  They wish to continue current treatments and upon discharge wish for patient to have hospice services follow-up.  Maintains good diuresis with stable creatinine.  Continue IV diuretics  Assessment and Plan: * Acute on chronic systolic heart failure ([place])- (present on admission) Patient admitted with decompensated CHF EF 40-45% in 12/2020 Currently on IV lasix Still has evidence of volume overload Creatinine currently stable Continue current treatments Cardiology following, appreciate assistance Continue entresto, Toprol, lasix and empagliflozin   Acute respiratory failure with hypoxia ([place])- (present on admission) Secondary to decompensated CHF Patient initially required up to 10 L of oxygen, now down to 2 L He does not wear any oxygen at home Continue to wean down oxygen as tolerated  ATRIAL FIBRILLATION - (present on admission) Permanent atrial fibrillation HR currently stable Continue current dose of  toprol He is anticoagulated with pradaxa  AKI (acute kidney injury) ([place])- (present on admission) Baseline creatinine approx 1.2 Admission creatinine 1.9 Suspect this is cardiorenal Creatinine down to 1.45 today with IV lasix Urine output seems to be slowing down. May need to consider transitioning to po diuretics tomorrow if cardiology agrees  Essential hypertension- (present on admission) BP currently stable Continue on amlodipine, entresto and toprol  Hypercholesterolemia- (present on admission) Continue statin  Blisters of multiple sites- (present on admission) See venous insufficiency  Venous (peripheral) insufficiency- (present on admission) Suspect swelling and blisters in LE related to venous insufficiency and CHF Keep LE elevated Should hopefully improve with further diuresis Currently on doxycycline  Appreciate wound care input  Neurodegenerative dementia ([place])- (present on admission) Chronic dementia Family reports that patient does have some sundowning This is usually better if family member spends the night (which they intend to do)  Complex sleep apnea syndrome Continue Bipap QHS  Anxiety- (present on admission) Continue on clonazepam and paroxetine   Counseling regarding advance care planning and goals of care Palliative care following After meeting with family on 2/2, patient is now DNR Family wishes to continue current treatments in order to optimize his cardiac and respiratory status Upon discharge, they are requesting that hospice services follow patient at home Family will have DME equipment for patient set up in home today They are arranging private care staff at home for patient        Subjective: he feels he is improving. Recognizes that he may have gotten disoriented/confused earlier.  Physical Exam: Vitals:   03/07/21 0422 03/07/21 0826 03/07/21 0908 03/07/21 2023  BP: (!) 124/59 120/88  (!) 116/58  Pulse: 86 62  (!)  113  Resp: 18 18   20   Temp: 97.8 F (36.6 C)   98.2 F (36.8 C)  TempSrc: Axillary   Oral  SpO2: 92% 92% 97% 92%  Weight: 91.7 kg     Height:       General exam: Alert, awake, oriented x 3 Respiratory system: Clear to auscultation. Respiratory effort normal. Cardiovascular system:RRR. No murmurs, rubs, gallops. Gastrointestinal system: Abdomen is nondistended, soft and nontender. No organomegaly or masses felt. Normal bowel sounds heard. Central nervous system: Alert and oriented. No focal neurological deficits. Extremities: b/l LE edema with legs being wrapped in dressing Skin: No rashes, lesions or ulcers Psychiatry: pleasant   Data Reviewed:  Reviewed BMET today, repeat BMET ordered for AM  Family Communication: no family present  Disposition: Status is: Inpatient Remains inpatient appropriate because: IV lasix          Planned Discharge Destination: Home     Time spent: 35 minutes  Author: Kathie Dike, MD 03/07/2021 8:25 PM  For on call review [email or web address].

## 2021-03-08 ENCOUNTER — Encounter: Payer: Self-pay | Admitting: Internal Medicine

## 2021-03-08 DIAGNOSIS — I4821 Permanent atrial fibrillation: Secondary | ICD-10-CM | POA: Diagnosis not present

## 2021-03-08 DIAGNOSIS — I5023 Acute on chronic systolic (congestive) heart failure: Secondary | ICD-10-CM | POA: Diagnosis not present

## 2021-03-08 LAB — BASIC METABOLIC PANEL
Anion gap: 11 (ref 5–15)
BUN: 42 mg/dL — ABNORMAL HIGH (ref 8–23)
CO2: 27 mmol/L (ref 22–32)
Calcium: 8.9 mg/dL (ref 8.9–10.3)
Chloride: 100 mmol/L (ref 98–111)
Creatinine, Ser: 1.4 mg/dL — ABNORMAL HIGH (ref 0.61–1.24)
GFR, Estimated: 50 mL/min — ABNORMAL LOW (ref >60)
Glucose, Bld: 100 mg/dL — ABNORMAL HIGH (ref 70–99)
Potassium: 3.5 mmol/L (ref 3.5–5.1)
Sodium: 138 mmol/L (ref 135–145)

## 2021-03-08 MED ORDER — POTASSIUM CHLORIDE CRYS ER 20 MEQ PO TBCR
60.0000 meq | EXTENDED_RELEASE_TABLET | Freq: Once | ORAL | Status: AC
  Administered 2021-03-08: 60 meq via ORAL
  Filled 2021-03-08: qty 3

## 2021-03-08 NOTE — Progress Notes (Addendum)
Progress Note   Patient: Samuel Moyer. MHD:[id number] DOB: October 23, 1940 DOA: 02/28/2021     7 DOS: the patient was seen and examined on 03/08/2021   Brief hospital course:  81 y.o. male with medical history significant of venous insufficiency, dementia, angioma, hyperlipidemia, gout, hypertension, sleep apnea on BiPAP, CHF, A. fib, asthma, anxiety presented with worsening shortness of breath, lower extremity edema with worsening lower extremity blisters despite being placed on oral doxycycline as an outpatient by dermatology last week.  He apparently gained 20 pound in the last week or so; Lasix dose was recently increased to 80 mg twice daily.  On presentation, creatinine was 1.98, from baseline of 1.2; BNP more than 4000.  Chest x-ray showed cardiomegaly with questionable small left pleural effusion.  He was started on IV Lasix 120mg  bid. He is being followed by cardiology. Palliative care also following for goals of care.  After discussing with family, they had agreed to DNR status.  They wish to continue current treatments and upon discharge wish for patient to have hospice services follow-up.  Maintains good diuresis with stable creatinine.  Continue IV diuretics  Assessment and Plan: * Acute on chronic systolic heart failure ([place])- (present on admission) Patient admitted with decompensated CHF EF 40-45% in 12/2020 Currently on IV lasix, possibly transitioning to p.o. tomorrow Creatinine currently stable Continue current treatments Cardiology following, appreciate assistance Continue entresto, Toprol, lasix and empagliflozin   Acute respiratory failure with hypoxia ([place])- (present on admission) Secondary to decompensated CHF Patient initially required up to 10 L of oxygen, now down to 2 L He does not wear any oxygen at home Continue to wean down oxygen as tolerated  ATRIAL FIBRILLATION - (present on admission) Permanent atrial fibrillation HR currently stable Continue current dose of  toprol He is anticoagulated with pradaxa  AKI (acute kidney injury) ([place])- (present on admission) Baseline creatinine approx 1.2, patient has CKD 3a at baseline Admission creatinine 1.9 Suspect this is cardiorenal Creatinine down to 1.40 today with IV lasix Per cardiology, likely transition to oral diuretics tomorrow  Essential hypertension- (present on admission) BP currently stable Continue on amlodipine, entresto and toprol  Hypercholesterolemia- (present on admission) Continue statin  Blisters of multiple sites- (present on admission) See venous insufficiency  Venous (peripheral) insufficiency- (present on admission) Suspect swelling and blisters in LE related to venous insufficiency and CHF Keep LE elevated Should hopefully improve with further diuresis He completed a course of doxycycline Appreciate wound care input  Neurodegenerative dementia ([place])- (present on admission) Chronic dementia Family reports that patient does have some sundowning This is usually better if family member spends the night (which they intend to do)  Complex sleep apnea syndrome Continue Bipap QHS  Anxiety- (present on admission) Continue on clonazepam and paroxetine   Counseling regarding advance care planning and goals of care Palliative care following After meeting with family on 2/2, patient is now DNR Family wishes to continue current treatments in order to optimize his cardiac and respiratory status Upon discharge, they are requesting that hospice services follow patient at home DME for patient has been delivered to his home They are arranging private care staff at home for patient  Lacticemia  -lactate of 2.8 on admission -likely related to hypoperfusion in the setting of low cardiac output      Subjective: No complaints.  Denies any shortness of breath  Physical Exam: Vitals:   03/08/21 0017 03/08/21 0402 03/08/21 1534 03/08/21 2103  BP: 119/83 (!) 139/98 115/70 116/86  Pulse: 62 74  (!) 53  Resp: 20 20  20   Temp: 98.7 F (37.1 C) 97.9 F (36.6 C) 97.7 F (36.5 C) (!) 97.3 F (36.3 C)  TempSrc: Axillary Oral Oral Oral  SpO2: 95% 97% 93% 94%  Weight: 88.8 kg     Height:       General exam: Alert, awake, no distress Respiratory system: Clear to auscultation. Respiratory effort normal. Cardiovascular system:RRR. No murmurs, rubs, gallops. Gastrointestinal system: Abdomen is nondistended, soft and nontender. No organomegaly or masses felt. Normal bowel sounds heard. Central nervous system:  No focal neurological deficits. Extremities: Bilateral lower extremity edema, dressings on both legs Skin: No rashes, lesions or ulcers Psychiatry: Pleasant, calm   Data Reviewed:  Reviewed chemistry panel, repeat chemistry ordered for a.m.  Family Communication: Updated patient's daughter over the phone  Disposition: Status is: Inpatient Remains inpatient appropriate because: IV diuretics          Planned Discharge Destination: Home     Time spent: 35 minutes  Author: Kathie Dike, MD 03/08/2021 10:58 PM  For on call review [email or web address].

## 2021-03-08 NOTE — Progress Notes (Signed)
Progress Note  Patient Name: Samuel Moyer. Date of Encounter: 03/08/2021  [facility] Cardiologist: [name], MD   Subjective   Sitting up in bed. No complaints. Family at the bedside.   Inpatient Medications    Scheduled Meds:  allopurinol  300 mg Oral Daily   amLODipine  2.5 mg Oral Daily   atorvastatin  20 mg Oral Daily   dabigatran  150 mg Oral Q12H   doxycycline  100 mg Oral Q12H   empagliflozin  10 mg Oral QAC breakfast   latanoprost  1 drop Both Eyes QHS   metoprolol succinate  50 mg Oral Daily   PARoxetine  10 mg Oral Daily   sacubitril-valsartan  1 tablet Oral BID   sodium chloride flush  3 mL Intravenous Q12H   Continuous Infusions:  furosemide 120 mg (03/08/21 0743)   PRN Meds: acetaminophen **OR** acetaminophen, albuterol, clonazepam, haloperidol lactate, polyethylene glycol   Vital Signs    Vitals:   03/07/21 2030 03/07/21 2322 03/08/21 0017 03/08/21 0402  BP:   119/83 (!) 139/98  Pulse: 100  62 74  Resp:  18 20 20   Temp:   98.7 F (37.1 C) 97.9 F (36.6 C)  TempSrc:   Axillary Oral  SpO2:   95% 97%  Weight:   88.8 kg   Height:        Intake/Output Summary (Last 24 hours) at 03/08/2021 0950 Last data filed at 03/07/2021 2100 Gross per 24 hour  Intake --  Output 650 ml  Net -650 ml   Last 3 Weights 03/08/2021 03/07/2021 03/06/2021  Weight (lbs) 195 lb 12.3 oz 202 lb 2.6 oz 207 lb 10.8 oz  Weight (kg) 88.8 kg 91.7 kg 94.2 kg      Telemetry    Not on telemetry  ECG    No new tracing  Physical Exam   GEN: Frail older male, No acute distress.  [place] @2L  Neck: No JVD Cardiac: Irreg Irreg, no murmurs, rubs, or gallops.  Respiratory: Clear to auscultation bilaterally. GI: Soft, nontender, non-distended  MS: No edema; No deformity. Legs wrapped Neuro:  Nonfocal  Psych: Normal affect   Labs    High Sensitivity Troponin:  No results for input(s): TROPONINIHS in the last 720 hours.   Chemistry Recent Labs  Lab 03/02/21 0143  03/03/21 0251 03/04/21 0301 03/05/21 0406 03/06/21 1021 03/07/21 1320 03/08/21 0038  NA 139 140 140   < > 139 139 138  K 4.6 3.3* 3.4*   < > 3.7 3.6 3.5  CL 105 104 104   < > 102 97* 100  CO2 20* 26 27   < > 28 30 27   GLUCOSE 96 100* 100*   < > 125* 152* 100*  BUN 40* 41* 36*   < > 33* 34* 42*  CREATININE 1.93* 1.83* 1.65*   < > 1.61* 1.45* 1.40*  CALCIUM 9.0 9.0 8.8*   < > 8.9 8.9 8.9  MG 2.1  --  2.1  --   --   --   --   PROT 6.2* 6.0*  --   --   --   --   --   ALBUMIN 3.5 3.1*  --   --   --   --   --   AST 46* 32  --   --   --   --   --   ALT 22 19  --   --   --   --   --  ALKPHOS 94 85  --   --   --   --   --   BILITOT 2.7* 1.4*  --   --   --   --   --   GFRNONAA 34* 37* 41*   < > 43* 48* 50*  ANIONGAP 14 10 9    < > 9 12 11    < > = values in this interval not displayed.    Lipids No results for input(s): CHOL, TRIG, HDL, LABVLDL, LDLCALC, CHOLHDL in the last 168 hours.  Hematology Recent Labs  Lab 03/01/21 1102 03/02/21 0143 03/03/21 0251  WBC  --  9.4 6.6  RBC  --  4.20* 3.82*  HGB 14.6 14.2 13.0  HCT 43.0 44.4 38.3*  MCV  --  105.7* 100.3*  MCH  --  33.8 34.0  MCHC  --  32.0 33.9  RDW  --  19.5* 18.7*  PLT  --  140* 147*   Thyroid  Recent Labs  Lab 03/02/21 1318  TSH 2.286    BNPNo results for input(s): BNP, PROBNP in the last 168 hours.  DDimer No results for input(s): DDIMER in the last 168 hours.   Radiology    No results found.  Cardiac Studies   [name]   Patient Profile     81 y.o. 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 Starla Link.  Assessment & Plan    Acute on Chronic  combined heart failure with BiV failure: Last echocardiogram January 06, 2021 showed LV function of 40 to 45% with diffuse hypokinesis and inferior basal which appears similar to prior echocardiogram.  Bilateral atrial enlargement. Patient presented with evidence of right-sided heart failure on exam.   -- Weight was up > 17 lbs in one month despite good medical therapy and close monthly follow up. -- has been diuresing with IV lasix, net - 9L and weight down 217>>195. Renal function improving. Has had some episodes of urinary incontinence therefore UOP not quite accurate.  -- would continue IV lasix through today, consider transition to oral dosing tomorrow  -- Remains on Entresto, Toprol-XL 50 mg daily and jardiance   Persistent atrial fibrillation: Rate controlled -- Continue beta-blocker and Pradaxa    Hypertension: Blood pressure controlled -- Continue current therapy   Acute on chronic kidney injury: Likely due to volume overload.  Baseline creatinine was 1.2 last month. Cr peaked at 1.93, now improved to 1.40 today    For questions or updates, please contact [place] Please consult [email or web address] for contact info under        Signed, Reino Bellis, NP  03/08/2021, 9:50 AM

## 2021-03-08 NOTE — Progress Notes (Signed)
Manufacturing engineer [facility]) Hospital Liaison Note   [facility] HLT continues to follow patient for D/C planning. Hospice eligibility has been approved. Per MD, he is not medically cleared to D/C at this time. Please call with any questions/concerns.    Thank you for the opportunity to participate in this patient's care.   Buck Mam [facility] Liaison  [phone]

## 2021-03-08 NOTE — Progress Notes (Signed)
Patient pulled bipap off refusing to have it replaced. Oxygen at 2 liters HFNC placed.

## 2021-03-09 ENCOUNTER — Other Ambulatory Visit (HOSPITAL_COMMUNITY): Payer: Self-pay

## 2021-03-09 ENCOUNTER — Telehealth: Payer: Self-pay | Admitting: Cardiology

## 2021-03-09 DIAGNOSIS — I4821 Permanent atrial fibrillation: Secondary | ICD-10-CM | POA: Diagnosis not present

## 2021-03-09 DIAGNOSIS — I5023 Acute on chronic systolic (congestive) heart failure: Secondary | ICD-10-CM | POA: Diagnosis not present

## 2021-03-09 LAB — BASIC METABOLIC PANEL
Anion gap: 10 (ref 5–15)
BUN: 37 mg/dL — ABNORMAL HIGH (ref 8–23)
CO2: 28 mmol/L (ref 22–32)
Calcium: 9.1 mg/dL (ref 8.9–10.3)
Chloride: 102 mmol/L (ref 98–111)
Creatinine, Ser: 1.53 mg/dL — ABNORMAL HIGH (ref 0.61–1.24)
GFR, Estimated: 45 mL/min — ABNORMAL LOW (ref >60)
Glucose, Bld: 90 mg/dL (ref 70–99)
Potassium: 4 mmol/L (ref 3.5–5.1)
Sodium: 140 mmol/L (ref 135–145)

## 2021-03-09 MED ORDER — FUROSEMIDE 40 MG PO TABS: 80.0000 mg | ORAL_TABLET | Freq: Two times a day (BID) | ORAL | Status: DC

## 2021-03-09 MED ORDER — FUROSEMIDE 80 MG PO TABS
80.0000 mg | ORAL_TABLET | Freq: Two times a day (BID) | ORAL | 0 refills | Status: AC
  Filled 2021-03-09: qty 60, 30d supply, fill #0

## 2021-03-09 NOTE — Assessment & Plan Note (Signed)
No acute flare.  

## 2021-03-09 NOTE — Telephone Encounter (Signed)
Returned call to [place] states that patients daughter wanted to know if Dr. Martinique would manage patients hospice care as the attending. Advised her that is not something that cardiology typically manages but that I would forward message to Dr. Martinique for him to review and advise and that we would give her a call back to let her know. She verbalized understanding.

## 2021-03-09 NOTE — TOC Transition Note (Addendum)
Transition of Care [facility]) - CM/SW Discharge Note   Patient Details  Name: Samuel Moyer. MRN: [id number] Date of Birth: [birth year]  Transition of Care [facility]) CM/SW Contact:  Zenon Mayo, RN Phone Number: 03/09/2021, 2:12 PM   Clinical Narrative:    Patient is for dc today home with hospice with [facility].  NCM notified [place] with Lonia Chimera, daughter Mickel Baas.  NCM scheduled ptar for transport, address confirmed.  Ptar states will be here in about 2 hrs from now.  DNR has been signe and in packet on unit.   13:34- Ptar came sooner than 2 hrs, NCM notified daughter, she state she will be here in 15 mins to go over dc paper work, [employer] informed Primary school teacher of this information.   Final next level of care: Home w Hospice Care Barriers to Discharge: No Barriers Identified   Patient Goals and CMS Choice Patient states their goals for this hospitalization and ongoing recovery are:: home with hospice CMS Medicare.gov Compare Post Acute Care list provided to:: Patient Represenative (must comment) Choice offered to / list presented to : Adult Children  Discharge Placement                       Discharge Plan and Services   Discharge Planning Services: CM Consult Post Acute Care Choice: Hospice          DME Arranged:  ([facility] to supply DME) DME Agency: NA       HH Arranged: RN HH Agency:  Teacher, early years/pre) Date [place]: 03/03/21 Time HH Agency Contacted: 1500 Representative spoke with at [place]: [place] Determinants of Health ([place]) Interventions     Readmission Risk Interventions No flowsheet data found.

## 2021-03-09 NOTE — Progress Notes (Signed)
Physical Therapy Treatment Patient Details Name: Samuel Moyer. MRN: [id number] DOB: 17-May-1940 Today's Date: 03/09/2021   History of Present Illness Pt is an 81 y/o male presenting with SOB and LE edema with worsening LE blisters.  PMHx:  afib, CHF, Colitis, DM2, gout, HTN, LV dysfunction, OSA, PVC's    PT Comments    The pt was agreeable to session with focus on sit-stand transfers and OOB mobility. He was able to demo good progress within the session with sit-stand transfers, progressing from [place] to minG with continued reps. He does continue to need cues for hand placement with each rep and increased time to complete, but is able to stand without physical assist. He also was able to complete x3 bouts of 4ft forwards and backwards walking with use of RW. He demos strong posterior lean initially, but improved with continued practice. Will continue to need assistance for OOB mobility after return home, but is safe to return with family and Hospice care once medically stable.     Recommendations for follow up therapy are one component of a multi-disciplinary discharge planning process, led by the attending physician.  Recommendations may be updated based on patient status, additional functional criteria and insurance authorization.  Follow Up Recommendations  Other (comment) (Hospice Care/ No follow up if pt able to transfer and walk short distance)     Assistance Recommended at Discharge Frequent or constant Supervision/Assistance  Patient can return home with the following A little help with walking and/or transfers;A little help with bathing/dressing/bathroom;Assistance with cooking/housework;Direct supervision/assist for medications management;Direct supervision/assist for financial management;Assist for transportation;Help with stairs or ramp for entrance   Equipment Recommendations  Other (comment) (hospice ordering equipment)    Recommendations for Other Services       Precautions  / Restrictions Precautions Precautions: Fall Precaution Comments: on 1L O2 for session Restrictions Weight Bearing Restrictions: No     Mobility  Bed Mobility Overal bed mobility: Needs Assistance Bed Mobility: Supine to Sit, Sit to Supine     Supine to sit: Min guard, HOB elevated Sit to supine: Min guard   General bed mobility comments: minG with pt using bed rails, increased time to complete but no assist given. minG to return to flat bed    Transfers Overall transfer level: Needs assistance Equipment used: Rolling walker (2 wheels) Transfers: Sit to/from Stand, Bed to chair/wheelchair/BSC Sit to Stand: Mod assist, Min guard           General transfer comment: pt initially completing from elevated surface with modA, progressed with reps to minG and completed x3 with cues for hand placement but no assist to power up    Ambulation/Gait Ambulation/Gait assistance: Mod assist, Min assist Gait Distance (Feet): 4 Feet (forwards and back x3) Assistive device: Rolling walker (2 wheels) Gait Pattern/deviations: Step-to pattern, Decreased stride length, Leaning posteriorly, Shuffle Gait velocity: decreased Gait velocity interpretation: <1.31 ft/sec, indicative of household ambulator   General Gait Details: small steps with posterior lean needing modA initially. progressed to minA with reps. minA to steady RW       Balance Overall balance assessment: Needs assistance Sitting-balance support: No upper extremity supported, Feet supported Sitting balance-Leahy Scale: Fair Sitting balance - Comments: able to sittle with UE assist Postural control: Posterior lean Standing balance support: Bilateral upper extremity supported, Reliant on assistive device for balance Standing balance-Leahy Scale: Poor Standing balance comment: dependent on BUE support for gait  Cognition Arousal/Alertness: Awake/alert Behavior During Therapy: WFL for  tasks assessed/performed Overall Cognitive Status: No family/caregiver present to determine baseline cognitive functioning                                 General Comments: pt able to follow simple cues with increased time. incontinent of urine upon arrival and able to state this to me but had not alerted staff. States he has been confused on what day it is but family has helped to orient        Exercises Other Exercises Other Exercises: repeated sit-stand from EOB x 3    General Comments General comments (skin integrity, edema, etc.): VSS on RA      Pertinent Vitals/Pain Pain Assessment Pain Assessment: Faces Faces Pain Scale: Hurts little more Pain Location: bil LE's distal to "shins" Pain Descriptors / Indicators: Burning, Grimacing, Sore Pain Intervention(s): Limited activity within patient's tolerance, Monitored during session, Repositioned     PT Goals (current goals can now be found in the care plan section) Acute Rehab PT Goals Patient Stated Goal: family wish pt to be able to move safely in the home as needed with available assist, but kept comfortable. PT Goal Formulation: With patient/family Time For Goal Achievement: 03/18/21 Potential to Achieve Goals: Good Progress towards PT goals: Progressing toward goals    Frequency    Min 3X/week      PT Plan Current plan remains appropriate       AM-PAC PT "6 Clicks" Mobility   Outcome Measure  Help needed turning from your back to your side while in a flat bed without using bedrails?: A Little Help needed moving from lying on your back to sitting on the side of a flat bed without using bedrails?: A Little Help needed moving to and from a bed to a chair (including a wheelchair)?: A Lot Help needed standing up from a chair using your arms (e.g., wheelchair or bedside chair)?: A Lot Help needed to walk in hospital room?: Total Help needed climbing 3-5 steps with a railing? : Total 6 Click Score:  12    End of Session Equipment Utilized During Treatment: Gait belt;Oxygen Activity Tolerance: Patient tolerated treatment well;Patient limited by pain Patient left: with call bell/phone within reach;in bed;with bed alarm set Nurse Communication: Mobility status PT Visit Diagnosis: Other abnormalities of gait and mobility (R26.89);Pain Pain - Right/Left: Right Pain - part of body: Leg;Ankle and joints of foot     Time: 1340-1404 PT Time Calculation (min) (ACUTE ONLY): 24 min  Charges:  $Therapeutic Exercise: 23-37 mins                     West Carbo, PT, DPT   Acute Rehabilitation Department Pager #: [phone]   Sandra Cockayne 03/09/2021, 2:40 PM

## 2021-03-09 NOTE — TOC Progression Note (Signed)
Transition of Care (TOC) - Progression Note    Patient Details  Name: Samuel Moyer. MRN: [id number] Date of Birth: 07-11-1940  Transition of Care [facility]) CM/SW Contact  Zenon Mayo, RN Phone Number: 03/09/2021, 1:01 PM  Clinical Narrative:    Patient 's daughter states they are ready to receive patient at home with hospice, he will need ambulance transport, private duty aide will be there at 5 pm today.  NCM checking with MD to see if patient can be dc today.    Expected Discharge Plan: Home w Hospice Care Barriers to Discharge: Continued Medical Work up  Expected Discharge Plan and Services Expected Discharge Plan: [place]   Discharge Planning Services: CM Consult Post Acute Care Choice: Hospice Living arrangements for the past 2 months: Single Family Home                 DME Arranged:  ([facility] to supply DME)         HH Arranged: RN HH Agency:  Teacher, early years/pre) Date [place]: 03/03/21 Time HH Agency Contacted: 1500 Representative spoke with at [place]: [place] ([place]) Interventions    Readmission Risk Interventions No flowsheet data found.

## 2021-03-09 NOTE — Progress Notes (Addendum)
Progress Note  Patient Name: Samuel Moyer. Date of Encounter: 03/09/2021  [facility] Cardiologist: [name], MD   Subjective   Sitting up in bed. Feeling well.   Inpatient Medications    Scheduled Meds:  allopurinol  300 mg Oral Daily   amLODipine  2.5 mg Oral Daily   atorvastatin  20 mg Oral Daily   dabigatran  150 mg Oral Q12H   empagliflozin  10 mg Oral QAC breakfast   [START ON 03/10/2021] furosemide  80 mg Oral BID   latanoprost  1 drop Both Eyes QHS   metoprolol succinate  50 mg Oral Daily   PARoxetine  10 mg Oral Daily   sacubitril-valsartan  1 tablet Oral BID   sodium chloride flush  3 mL Intravenous Q12H   Continuous Infusions:  PRN Meds: acetaminophen **OR** acetaminophen, albuterol, clonazepam, haloperidol lactate, polyethylene glycol   Vital Signs    Vitals:   03/09/21 0034 03/09/21 0525 03/09/21 0722 03/09/21 0822  BP:  131/84 121/81   Pulse:  88 67 (!) 57  Resp:  18 16 16   Temp:  98.6 F (37 C)    TempSrc:  Oral    SpO2:  90% 95% 96%  Weight: 87.9 kg     Height:        Intake/Output Summary (Last 24 hours) at 03/09/2021 1020 Last data filed at 03/09/2021 0531 Gross per 24 hour  Intake 730 ml  Output 2900 ml  Net -2170 ml   Last 3 Weights 03/09/2021 03/08/2021 03/07/2021  Weight (lbs) 193 lb 12.6 oz 195 lb 12.3 oz 202 lb 2.6 oz  Weight (kg) 87.9 kg 88.8 kg 91.7 kg      Telemetry    Not on telemetry  ECG    No new tracing  Physical Exam   GEN: Frail older male, No acute distress.   Neck: No JVD Cardiac: Irreg Irreg, no murmurs, rubs, or gallops.  Respiratory: Clear to auscultation bilaterally. GI: Soft, nontender, non-distended  MS: No edema; legs wrapped  Neuro:  Nonfocal  Psych: Normal affect   Labs    High Sensitivity Troponin:  No results for input(s): TROPONINIHS in the last 720 hours.   Chemistry Recent Labs  Lab 03/03/21 0251 03/04/21 0301 03/05/21 0406 03/07/21 1320 03/08/21 0038 03/09/21 0425  NA 140 140   <  > 139 138 140  K 3.3* 3.4*   < > 3.6 3.5 4.0  CL 104 104   < > 97* 100 102  CO2 26 27   < > 30 27 28   GLUCOSE 100* 100*   < > 152* 100* 90  BUN 41* 36*   < > 34* 42* 37*  CREATININE 1.83* 1.65*   < > 1.45* 1.40* 1.53*  CALCIUM 9.0 8.8*   < > 8.9 8.9 9.1  MG  --  2.1  --   --   --   --   PROT 6.0*  --   --   --   --   --   ALBUMIN 3.1*  --   --   --   --   --   AST 32  --   --   --   --   --   ALT 19  --   --   --   --   --   ALKPHOS 85  --   --   --   --   --   BILITOT 1.4*  --   --   --   --   --  GFRNONAA 37* 41*   < > 48* 50* 45*  ANIONGAP 10 9   < > 12 11 10    < > = values in this interval not displayed.    Lipids No results for input(s): CHOL, TRIG, HDL, LABVLDL, LDLCALC, CHOLHDL in the last 168 hours.  Hematology Recent Labs  Lab 03/03/21 0251  WBC 6.6  RBC 3.82*  HGB 13.0  HCT 38.3*  MCV 100.3*  MCH 34.0  MCHC 33.9  RDW 18.7*  PLT 147*   Thyroid  Recent Labs  Lab 03/02/21 1318  TSH 2.286    BNPNo results for input(s): BNP, PROBNP in the last 168 hours.  DDimer No results for input(s): DDIMER in the last 168 hours.   Radiology    No results found.  Cardiac Studies   [name]   Patient Profile     81 y.o. 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 Starla Link.  Assessment & Plan    Acute on Chronic combined heart failure with BiV failure: Last echocardiogram January 06, 2021 showed LV function of 40 to 45% with diffuse hypokinesis and inferior basal which appears similar to prior echocardiogram.  Bilateral atrial enlargement. Patient presented with evidence of right-sided heart failure on  exam.   -- Weight was up > 17 lbs in one month despite good medical therapy and close monthly follow up. -- has been diuresing with IV lasix, net - 11.2L and weight down 217>>195>>193. Renal function is stable.  -- received IV lasix this morning, will transition to 80mg  BID PO starting tomorrow.  -- Remains on Entresto, Toprol-XL 50 mg daily and jardiance    Persistent atrial fibrillation: Rate controlled -- Continue beta-blocker and Pradaxa    Hypertension: Blood pressure controlled -- Continue current therapy   Acute on chronic kidney injury: Likely due to volume overload.  Baseline creatinine was 1.2 last month. Cr peaked at 1.93, now improved to 1.40, mild bump to 1.5 today -- plan to resume oral lasix tomorrow   For questions or updates, please contact [place] Please consult [email or web address] for contact info under        Signed, Reino Bellis, NP  03/09/2021, 10:20 AM    I have examined the patient and reviewed assessment and plan and discussed with patient.  Agree with above as stated.   Hoping for discharge today.  Getting things set at home for him with support.  Patient feels well and is looking forward to leaving the hospital.    Larae Grooms

## 2021-03-09 NOTE — Discharge Summary (Signed)
Physician Discharge Summary   Patient: Samuel Moyer. MRN: [id number] DOB: Apr 22, 1940  Admit date:     02/28/2021  Discharge date: 03/09/21  Discharge Physician: Jimmy Picket Kemari Mares   PCP: Binnie Rail, MD   Recommendations at discharge:    Patient will continue diuresis with furosemide 80 mg po bid Follow up renal function and electrolytes in 7 days Continue heart failure management with empagliflozin, entresto and metoprolol.   I spoke over the phone with the patient's daughter about patient's  condition, plan of care, prognosis and all questions were addressed.   Discharge Diagnoses: Principal Problem:   Acute on chronic systolic heart failure (HCC) Active Problems:   Acute respiratory failure with hypoxia (HCC)   AKI (acute kidney injury) ([place])   Essential hypertension   ATRIAL FIBRILLATION    Venous (peripheral) insufficiency   Hypercholesterolemia   Anxiety   Complex sleep apnea syndrome   Gout   Neurodegenerative dementia (HCC)   Blisters of multiple sites   Counseling regarding advance care planning and goals of care  Resolved Problems:   * No resolved hospital problems. [facility] Course: Samuel Moyer was admitted to the hospital with the working diagnosis of acute heart failure decompensation.   81 y.o. male with medical history significant of venous insufficiency, dementia, angioma, hyperlipidemia, gout, hypertension, sleep apnea on BiPAP, CHF, A. fib, asthma, anxiety presented with worsening shortness of breath, lower extremity edema with worsening lower extremity blisters despite being placed on oral doxycycline as an outpatient by dermatology for 7 days.  He apparently gained 20 pound in the last week or so; Lasix dose was recently increased to 80 mg twice daily.   On his initial physical examination his blood pressure was 156/95, HR 64, RR 17 and oxygen saturation 91%. Heart with S1 and S2 present and irregular, lungs with rales bilaterally, abdomen soft  and positive lower extremity edema.   Na 141, K 3,9, Cl 106, bicarbonate 17, glucose 107 bun 41 and cr 1,98 BNP 4007, lactic acid 2,8 Wbc 6,9, hgb 14,3 hct 43 and plt 146  SARS COVID 19 negative.  Chest radiograph with cardiomegaly.   He was started on IV Lasix 120mg  bid.  Cardiology and palliative care were consulted.  After discussing with family, they had agreed to DNR status.  They wish to continue current treatments and upon discharge wish for patient to have hospice services follow-up.   Improved volume status and plan to continue oral diuretic therapy at home with hospice.   Assessment and Plan: * Acute on chronic systolic heart failure ([place])- (present on admission) Patient was admitted to the cardiac ward and received IV furosemide for diuresis, negative fluid balanced was achieved, 12,458 ml, with significant improvement in his symptoms.   Echocardiogram with diffuse hypokinesis, EF 40 to 45%, with mild dilatation of LV cavity. Severe dilatation of right and left atriums. Moderate tricuspid regurgitation.   Patient will continue diuresis with furosemide 80 mg IV bid. Continue with metoprolol, entresto and empagliflozin.  Follow as outpatient with cardiology   Acute respiratory failure with hypoxia ([place])- (present on admission) Improvement in oxygenation and dyspnea after diuresis, his oxygenation is 92 to 100% on 2 L/min per [place] high flow.  Continue home 02 to keep 02 saturation 92% on greater.   AKI (acute kidney injury) ([place])- (present on admission) Hypokalemia.  Patient was diuresed with IV furosemide and negative fluid balance was achieved. His renal function at the time of discharge has a serum  cr of 1,53 with K of 4,0 and serum bicarbonate at 28. Received Kcl for hypokalemia correction.   Essential hypertension- (present on admission) Continue blood pressure control with amlodipine Continue with heart failure management with metoprolol and entresto. Diuresis with  furosemide.   ATRIAL FIBRILLATION - (present on admission) Continue rate control with metoprolol and anticoagulation with dabigatran.   Venous (peripheral) insufficiency- (present on admission) Positive wounds at his lower extremities, plan to continue with local wound care. Continue leg elevation.   Counseling regarding advance care planning and goals of care Palliative care following After meeting with family on 2/2, patient is now DNR Family wishes to continue current treatments in order to optimize his cardiac and respiratory status Upon discharge, they are requesting that hospice services follow patient at home DME for patient has been delivered to his home They are arranging private care staff at home for patient  Blisters of multiple sites- (present on admission) See venous insufficiency  Neurodegenerative dementia ([place])- (present on admission) Family reports that patient does have some sundowning Patient with no agitation.   Gout- (present on admission) No acute flare  Complex sleep apnea syndrome Continue Bipap QHS  Anxiety- (present on admission) On clonazepam and paroxetine   Hypercholesterolemia- (present on admission) Continue with statin therapy.            Consultants: cardiology and palliative care  Procedures performed: none   Disposition: Home with hospice  Diet recommendation:  Regular diet  DISCHARGE MEDICATION: Allergies as of 03/09/2021   No Known Allergies      Medication List     STOP taking these medications    doxycycline 100 MG tablet Commonly known as: VIBRA-TABS       TAKE these medications    acetaminophen 500 MG tablet Commonly known as: TYLENOL Take 1,000 mg by mouth every 6 (six) hours as needed for moderate pain or mild pain.   allopurinol 300 MG tablet Commonly known as: ZYLOPRIM TAKE 1 TABLET BY MOUTH EVERY DAY TO LOWER URIC ACID FOR GOUT What changed: See the new instructions.   amLODipine 5 MG  tablet Commonly known as: NORVASC Take 0.5 tablets (2.5 mg total) by mouth daily.   atorvastatin 20 MG tablet Commonly known as: LIPITOR Take 1 tablet (20 mg total) by mouth daily.   clonazePAM 0.5 MG tablet Commonly known as: KLONOPIN Take 0.5 tablets (0.25 mg total) by mouth 2 (two) times daily.   colchicine 0.6 MG tablet For gout flare take 2 pills po x 1 and 1 pill 1 hour later What changed:  how much to take how to take this when to take this   dabigatran 150 MG Caps capsule Commonly known as: Pradaxa TAKE 1 CAPSULE EVERY 12 HOURS What changed:  how much to take how to take this when to take this additional instructions   empagliflozin 10 MG Tabs tablet Commonly known as: Jardiance Take 1 tablet (10 mg total) by mouth daily before breakfast.   furosemide 80 MG tablet Commonly known as: LASIX Take 1 tablet (80 mg total) by mouth 2 (two) times daily. Start taking on: March 10, 2021 What changed:  medication strength how much to take how to take this when to take this additional instructions   Lumigan 0.01 % Soln Generic drug: bimatoprost Place 1 drop into both eyes at bedtime.   metoprolol succinate 50 MG 24 hr tablet Commonly known as: TOPROL-XL Take 1 tablet (50 mg total) by mouth daily. Take with or immediately following  a meal.   multivitamin tablet Take 1 tablet by mouth every evening.   PARoxetine 20 MG tablet Commonly known as: PAXIL TAKE 1/2 TABLET(10 MG) BY MOUTH DAILY What changed:  how much to take how to take this when to take this additional instructions   sacubitril-valsartan 97-103 MG Commonly known as: ENTRESTO Take 1 tablet by mouth 2 (two) times daily.   Triple Antibiotic Oint Apply 1 application topically as needed (inflammation).               Discharge Care Instructions  (From admission, onward)           Start     Ordered   03/09/21 0000  Discharge wound care:       Comments: Daily wound care. Apply  double folded xeroform gauze to bilateral posterior leg wounds daily, then cover with ABD pads and kerlex and ace wraps in a spiral fashion, beginning just behind toes to below knees.   03/09/21 1347            Follow-up Information     [facility] Follow up.   Specialty: Hospice and Palliative Medicine Why: Home Hospice Contact information: [place] [id number] [phone]                Discharge Exam: Danley Danker Weights   03/07/21 0422 03/08/21 0017 03/09/21 0034  Weight: 91.7 kg 88.8 kg 87.9 kg   Neurology Patient is awake and alert ENT., mild pallor but no icterus Cardiovascular. Heart with S1 and S2 present irregularly irregular, with no gallops or rubs, no murmurs No JVD Trace lower extremity edema, below the knees Abdomen soft and non tender Skin with dressings bilateral legs    Condition at discharge: stable  The results of significant diagnostics from this hospitalization (including imaging, microbiology, ancillary and laboratory) are listed below for reference.   Imaging Studies: DG Chest 2 View  Result Date: 02/23/2021 CLINICAL DATA:  Dyspnea on exertion. EXAM: CHEST - 2 VIEW COMPARISON:  One-view chest x-ray 12/16/2020. FINDINGS: Heart is enlarged. Atherosclerotic calcifications are present at the aortic arch. Mild pulmonary vascular congestion is noted. Small effusions are present. No focal airspace disease is evident. IMPRESSION: Cardiomegaly with mild pulmonary vascular congestion and small effusions compatible with congestive heart failure. Electronically Signed   By: San Morelle M.D.   On: 02/23/2021 16:41   DG Chest Portable 1 View  Result Date: 02/28/2021 CLINICAL DATA:  Shortness of breath. EXAM: PORTABLE CHEST 1 VIEW COMPARISON:  Chest x-ray 02/23/2021. FINDINGS: Cardiac silhouette is markedly enlarged unchanged. There is no focal lung consolidation. There may be a small left pleural effusion. There is  no evidence for pneumothorax or acute fracture. IMPRESSION: 1. Stable marked cardiomegaly. 2. Questionable small left pleural effusion. Electronically Signed   By: Ronney Asters M.D.   On: 02/28/2021 18:49    Microbiology: Results for orders placed or performed during the hospital encounter of 02/28/21  Resp Panel by RT-PCR (Flu A&B, Covid) Nasopharyngeal Swab     Status: None   Collection Time: 03/01/21  1:03 PM   Specimen: Nasopharyngeal Swab; Nasopharyngeal(NP) swabs in vial transport medium  Result Value Ref Range Status   SARS Coronavirus 2 by RT PCR NEGATIVE NEGATIVE Final    Comment: (NOTE) SARS-CoV-2 target nucleic acids are NOT DETECTED.  The SARS-CoV-2 RNA is generally detectable in upper respiratory specimens during the acute phase of infection. The lowest concentration of SARS-CoV-2 viral copies this assay can detect is 138  copies/mL. A negative result does not preclude SARS-Cov-2 infection and should not be used as the sole basis for treatment or other patient management decisions. A negative result may occur with  improper specimen collection/handling, submission of specimen other than nasopharyngeal swab, presence of viral mutation(s) within the areas targeted by this assay, and inadequate number of viral copies(<138 copies/mL). A negative result must be combined with clinical observations, patient history, and epidemiological information. The expected result is Negative.  Fact Sheet for Patients:  EntrepreneurPulse.com.au  Fact Sheet for Healthcare Providers:  IncredibleEmployment.be  This test is no t yet approved or cleared by the [place] FDA and  has been authorized for detection and/or diagnosis of SARS-CoV-2 by FDA under an Emergency Use Authorization (EUA). This EUA will remain  in effect (meaning this test can be used) for the duration of the COVID-19 declaration under Section 564(b)(1) of the Act, 21 U.S.C.section  360bbb-3(b)(1), unless the authorization is terminated  or revoked sooner.       Influenza A by PCR NEGATIVE NEGATIVE Final   Influenza B by PCR NEGATIVE NEGATIVE Final    Comment: (NOTE) The Xpert Xpress SARS-CoV-2/FLU/RSV plus assay is intended as an aid in the diagnosis of influenza from Nasopharyngeal swab specimens and should not be used as a sole basis for treatment. Nasal washings and aspirates are unacceptable for Xpert Xpress SARS-CoV-2/FLU/RSV testing.  Fact Sheet for Patients: EntrepreneurPulse.com.au  Fact Sheet for Healthcare Providers: IncredibleEmployment.be  This test is not yet approved or cleared by the [place] FDA and has been authorized for detection and/or diagnosis of SARS-CoV-2 by FDA under an Emergency Use Authorization (EUA). This EUA will remain in effect (meaning this test can be used) for the duration of the COVID-19 declaration under Section 564(b)(1) of the Act, 21 U.S.C. section 360bbb-3(b)(1), unless the authorization is terminated or revoked.  Performed at [facility], [place] [address], [place],  [address]     Labs: CBC: Recent Labs  Lab 03/03/21 0251  WBC 6.6  HGB 13.0  HCT 38.3*  MCV 100.3*  PLT 742*   Basic Metabolic Panel: Recent Labs  Lab 03/04/21 0301 03/05/21 0406 03/06/21 1021 03/07/21 1320 03/08/21 0038 03/09/21 0425  NA 140 138 139 139 138 140  K 3.4* 3.6 3.7 3.6 3.5 4.0  CL 104 102 102 97* 100 102  CO2 27 26 28 30 27 28   GLUCOSE 100* 95 125* 152* 100* 90  BUN 36* 36* 33* 34* 42* 37*  CREATININE 1.65* 1.51* 1.61* 1.45* 1.40* 1.53*  CALCIUM 8.8* 8.9 8.9 8.9 8.9 9.1  MG 2.1  --   --   --   --   --    Liver Function Tests: Recent Labs  Lab 03/03/21 0251  AST 32  ALT 19  ALKPHOS 85  BILITOT 1.4*  PROT 6.0*  ALBUMIN 3.1*   CBG: No results for input(s): GLUCAP in the last 168 hours.  Discharge time spent: greater than 30 minutes.  Signed: Tawni Millers, MD [facility] 03/09/2021

## 2021-03-09 NOTE — Telephone Encounter (Signed)
Jocelyn with [facility] states the patient will be following up with them after he is discharged and she would like to know if Dr. Martinique will be the patient's hospice attending provider. She states the patient's daughter requested Dr. Martinique instead of his PCP. Please advise.

## 2021-03-10 ENCOUNTER — Telehealth: Payer: Self-pay | Admitting: Internal Medicine

## 2021-03-10 NOTE — Telephone Encounter (Signed)
Spoke with Sch[place] today.

## 2021-03-10 NOTE — Telephone Encounter (Signed)
Yes, okay.

## 2021-03-10 NOTE — Telephone Encounter (Signed)
Rep w/ [facility] states family is requesting provider to be pts hospice attending  Rep requesting a c/b  Phone: [phone]

## 2021-03-10 NOTE — Telephone Encounter (Signed)
Spoke to Brent advice given.

## 2021-03-15 ENCOUNTER — Inpatient Hospital Stay: Payer: TRICARE For Life (TFL) | Admitting: Internal Medicine

## 2021-03-17 ENCOUNTER — Encounter: Payer: Self-pay | Admitting: Internal Medicine

## 2021-03-24 NOTE — Progress Notes (Unsigned)
Samuel Moyer Phone: [phone] Subjective:    I'm seeing this patient by the request  of:  Burns, Claudina Lick, MD  CC:   [id number]  Samuel Piet. is a 81 y.o. male coming in with complaint of X. Last seen in 2020 for L knee pain. Patient states       Past Medical History:  Diagnosis Date   A-fib [facility])    Anxiety    Atrial fibrillation (HCC)    CHF (congestive heart failure) ([place])    Claustrophobia    Occasionally when flying    Colitis    Diabetes mellitus, type 2 ([place])    Fracture of one rib, left side, initial encounter for closed fracture 12/27/2016   Occurred 12/21/16 after a fall at home.  Left anterior seventh rib   Gout    Hemorrhoids    Hyperlipidemia    Hypertension    LV dysfunction    EF 40-45%   OSA (obstructive sleep apnea)    CPAP machine    PVC's (premature ventricular contractions)    Skin cancer    Past Surgical History:  Procedure Laterality Date   CARDIOVASCULAR STRESS TEST  03/02/2010   EF 50%   MOHS SURGERY     RIGHT/LEFT HEART CATH AND CORONARY ANGIOGRAPHY [name] 03/18/2020   Procedure: RIGHT/LEFT HEART CATH AND CORONARY ANGIOGRAPHY;  Surgeon: Martinique, Peter M, MD;  Location: [facility];  Service: Cardiovascular;  Laterality: [name];   US ECHOCARDIOGRAPHY  11/15/2009   EF 40-45%   Social History   Socioeconomic History   Marital status: Married    Spouse name: Samuel Moyer   Number of children: 3   Years of education: Not on file   Highest education level: Bachelor's degree (e.g., BA, AB, BS)  Occupational History   Occupation: Retired    Fish farm manager: RETIRED    Comment: [employer]   Tobacco Use   Smoking status: Former    Packs/day: 1.00    Years: 16.00    Pack years: 16.00    Types: Cigarettes    Quit date: 06/30/1977    Years since quitting: 43.7   Smokeless tobacco: Never  Vaping Use   Vaping Use: Never used  Substance and Sexual Activity   Alcohol use: No    Drug use: No   Sexual activity: Not on file  Other Topics Concern   Not on file  Social History Narrative   03/23/20 lives with wife   2 caffeine drinks daily    Social Determinants of Health   Financial Resource Strain: Low Risk    Difficulty of Paying Living Expenses: Not hard at all  Food Insecurity: No Food Insecurity   Worried About Charity fundraiser in the Last Year: Never true   Arboriculturist in the Last Year: Never true  Transportation Needs: No Transportation Needs   Lack of Transportation (Medical): No   Lack of Transportation (Non-Medical): No  Physical Activity: Sufficiently Active   Days of Exercise per Week: 5 days   Minutes of Exercise per Session: 30 min  Stress: No Stress Concern Present   Feeling of Stress : Not at all  Social Connections: Socially Integrated   Frequency of Communication with Friends and Family: More than three times a week   Frequency of Social Gatherings with Friends and Family: More than three times a week   Attends Religious Services: More than 4 times per year  Active Member of Clubs or Organizations: Yes   Attends Music therapist: More than 4 times per year   Marital Status: Married   No Known Allergies Family History  Problem Relation Age of Onset   Hypertension Father    Heart attack Father        Age 91 (MI)   Heart failure Father    Colonic polyp Sister        and Father   Stroke Mother    Colon cancer Neg Hx    Stomach cancer Neg Hx     Current Outpatient Medications (Endocrine & Metabolic):    empagliflozin (JARDIANCE) 10 MG TABS tablet, Take 1 tablet (10 mg total) by mouth daily before breakfast.  Current Outpatient Medications (Cardiovascular):    amLODipine (NORVASC) 5 MG tablet, Take 0.5 tablets (2.5 mg total) by mouth daily.   atorvastatin (LIPITOR) 20 MG tablet, Take 1 tablet (20 mg total) by mouth daily.   furosemide (LASIX) 80 MG tablet, Take 1 tablet (80 mg total) by mouth 2 (two) times  daily.   metoprolol succinate (TOPROL-XL) 50 MG 24 hr tablet, Take 1 tablet (50 mg total) by mouth daily. Take with or immediately following a meal.   sacubitril-valsartan (ENTRESTO) 97-103 MG, Take 1 tablet by mouth 2 (two) times daily.   Current Outpatient Medications (Analgesics):    acetaminophen (TYLENOL) 500 MG tablet, Take 1,000 mg by mouth every 6 (six) hours as needed for moderate pain or mild pain.   allopurinol (ZYLOPRIM) 300 MG tablet, TAKE 1 TABLET BY MOUTH EVERY DAY TO LOWER URIC ACID FOR GOUT (Patient taking differently: Take 300 mg by mouth daily.)   colchicine 0.6 MG tablet, For gout flare take 2 pills po x 1 and 1 pill 1 hour later (Patient taking differently: Take 0.6 mg by mouth as directed. For gout flare take 2 pills po x 1 and 1 pill 1 hour later)  Current Outpatient Medications (Hematological):    dabigatran (PRADAXA) 150 MG CAPS capsule, TAKE 1 CAPSULE EVERY 12 HOURS (Patient taking differently: Take 150 mg by mouth 2 (two) times daily.)  Current Outpatient Medications (Other):    clonazePAM (KLONOPIN) 0.5 MG tablet, Take 0.5 tablets (0.25 mg total) by mouth 2 (two) times daily.   LUMIGAN 0.01 % SOLN, Place 1 drop into both eyes at bedtime.    Multiple Vitamin (MULTIVITAMIN) tablet, Take 1 tablet by mouth every evening.   Neomycin-Bacitracin-Polymyxin (TRIPLE ANTIBIOTIC) OINT, Apply 1 application topically as needed (inflammation).   PARoxetine (PAXIL) 20 MG tablet, TAKE 1/2 TABLET(10 MG) BY MOUTH DAILY (Patient taking differently: Take 10 mg by mouth daily.)   Reviewed prior external information including notes and imaging from  primary care provider As well as notes that were available from care everywhere and other healthcare systems.  Past medical history, social, surgical and family history all reviewed in electronic medical record.  No pertanent information unless stated regarding to the chief complaint.   Review of Systems:  No headache, visual changes,  nausea, vomiting, diarrhea, constipation, dizziness, abdominal pain, skin rash, fevers, chills, night sweats, weight loss, swollen lymph nodes, body aches, joint swelling, chest pain, shortness of breath, mood changes. POSITIVE muscle aches  Objective  There were no vitals taken for this visit.   General: No apparent distress alert and oriented x3 mood and affect normal, dressed appropriately.  HEENT: Pupils equal, extraocular movements intact  Respiratory: Patient's speak in full sentences and does not appear short of breath  Cardiovascular:  No lower extremity edema, non tender, no erythema  Gait normal with good balance and coordination.  MSK:  Non tender with full range of motion and good stability and symmetric strength and tone of shoulders, elbows, wrist, hip, knee and ankles bilaterally.     Impression and Recommendations:     The above documentation has been reviewed and is accurate and complete Jacqualin Combes

## 2021-03-25 ENCOUNTER — Encounter: Payer: Self-pay | Admitting: Internal Medicine

## 2021-03-25 ENCOUNTER — Ambulatory Visit: Payer: TRICARE For Life (TFL) | Admitting: Family Medicine

## 2021-03-30 DIAGNOSIS — I130 Hypertensive heart and chronic kidney disease with heart failure and stage 1 through stage 4 chronic kidney disease, or unspecified chronic kidney disease: Secondary | ICD-10-CM | POA: Diagnosis not present

## 2021-03-30 DIAGNOSIS — I4891 Unspecified atrial fibrillation: Secondary | ICD-10-CM | POA: Diagnosis not present

## 2021-03-30 DIAGNOSIS — E785 Hyperlipidemia, unspecified: Secondary | ICD-10-CM | POA: Diagnosis not present

## 2021-03-30 DIAGNOSIS — I5020 Unspecified systolic (congestive) heart failure: Secondary | ICD-10-CM | POA: Diagnosis not present

## 2021-03-30 DIAGNOSIS — J9601 Acute respiratory failure with hypoxia: Secondary | ICD-10-CM | POA: Diagnosis not present

## 2021-03-30 DIAGNOSIS — G4733 Obstructive sleep apnea (adult) (pediatric): Secondary | ICD-10-CM | POA: Diagnosis not present

## 2021-03-30 DIAGNOSIS — J45909 Unspecified asthma, uncomplicated: Secondary | ICD-10-CM | POA: Diagnosis not present

## 2021-03-30 DIAGNOSIS — H409 Unspecified glaucoma: Secondary | ICD-10-CM | POA: Diagnosis not present

## 2021-03-30 DIAGNOSIS — M109 Gout, unspecified: Secondary | ICD-10-CM | POA: Diagnosis not present

## 2021-03-30 DIAGNOSIS — F0393 Unspecified dementia, unspecified severity, with mood disturbance: Secondary | ICD-10-CM | POA: Diagnosis not present

## 2021-03-30 DIAGNOSIS — N1832 Chronic kidney disease, stage 3b: Secondary | ICD-10-CM | POA: Diagnosis not present

## 2021-04-05 ENCOUNTER — Telehealth: Payer: Self-pay | Admitting: Internal Medicine

## 2021-04-06 NOTE — Progress Notes (Unsigned)
Cardiology Office Note:    Date:  04/06/2021   ID:  Samuel Dalton., DOB Mar 24, 1940, MRN [id number]  PCP:  Binnie Rail, MD  Cardiologist:  Santoria Chason Martinique, MD  Electrophysiologist:  None   Referring MD: Binnie Rail, MD   Chief Complaint: follow-up of CHF  History of Present Illness:    Samuel Stueve. is a 81 y.o. 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  Patient was seen by Dr. Martinique on 02/05/2018 at which time her reported dyspnea on exertion (such as walking up a hill or carrying something) and weight gain.He did admit to eating out a lot and eating a lot of processed meats. Lasix was increased to 59m twice daily. BNP was ordered and came back elevated at 835. Echo was ordered and showed LVEF of 45-50% with hypokinesis of basal-mid inferior and inferolateral walls, severe biatrial enlargement, mild to moderate MR, and moderate TR. RV also moderately enlarged with moderately reduced systolic function and severely elevated PASP. He was seen by Dr. JMartiniqueagain on 03/15/2020 for follow-up at which time he reported increased urine output with increase in Lasix but weight and shortness of breath were unchanged. Right/left cardiac catheterization was recommended for further evaluation. This was performed on 03/18/2020 and showed mild non-obstructive CAD with moderately elevated LV filling pressures (LVEDP of 256mg), moderate pulmonary hypertension, and preserved cardiac output. Lasix was increased to 8034mn the morning and 37m93m the afternoon. Lisinopril was stopped with plan to start Entresto 49/51 twice daily after 3 day washout period.   Patient called our office on  03/22/2020 with reports of worsening shortness of breath since cardiac catheterization as well as dizziness. [facility]. This visit was scheduled or further evaluation. He was given a one month supply. Has been taking Entresto since then at 49/51 mg bid dose. Followed by Pharm D. Lasix reduced to 40 mg bid. On Jardiance. Toprol XL at 50 mg daily. Later Entresto dose increased to 97/103 mg bid and also on aldatone.   On his last visit we reduced his lasix to 40 mg bid. Last week he noted increased SOB, weight gain and dizziness. Weight was up 10 lbs. He was seen in the ED on 12/16/20. Felt to be volume overloaded and lasix dose increased back to 80 mg in the morning and 40 mg in the evening. He was given IV lasix x 1.  BNP was 2963. CXR clear. Troponins normal. BUN 35 with creatinine 1.58. Repeat Echo showed no change in EF 45% with moderate RV dysfunction.  When seen on Nov 22 we increased lasix to 80 mg in am and 40 in the pm. Notes his wife has not been well so has been eating out more.   He was admitted 1/30-03/09/21 when he presented with worsening shortness of breath, lower extremity edema and lower extremity blisters despite being placed on oral doxycycline as an outpatient by dermatology for 7 days.  He apparently gained 20 pound in the last week or so. He was diuresed with IV lasix. Overall I/O negative 12 liters and weight dropped 22 lbs. Renal function initially worsened but then improved. Legs treated by wound care. Seen by  palliative care and made DNR. DC with home hospice. Significant improvement in oxygenation.      Past Medical History:  Diagnosis Date   A-fib [facility])    Anxiety    Atrial fibrillation (HCC)    CHF (congestive heart failure) ([place])    Claustrophobia    Occasionally when flying    Colitis    Diabetes mellitus, type 2 ([place])    Fracture of one rib, left side, initial encounter for closed fracture 12/27/2016    Occurred 12/21/16 after a fall at home.  Left anterior seventh rib   Gout    Hemorrhoids    Hyperlipidemia    Hypertension    LV dysfunction    EF 40-45%   OSA (obstructive sleep apnea)    CPAP machine    PVC's (premature ventricular contractions)    Skin cancer     Past Surgical History:  Procedure Laterality Date   CARDIOVASCULAR STRESS TEST  03/02/2010   EF 50%   MOHS SURGERY     RIGHT/LEFT HEART CATH AND CORONARY ANGIOGRAPHY [name] 03/18/2020   Procedure: RIGHT/LEFT HEART CATH AND CORONARY ANGIOGRAPHY;  Surgeon: Samuel Moyer, Samuel Bonnie M, MD;  Location: [place] [facility];  Service: Cardiovascular;  Laterality: [name];   US ECHOCARDIOGRAPHY  11/15/2009   EF 40-45%    Current Medications: No outpatient medications have been marked as taking for the 04/11/21 encounter (Appointment) with Samuel Moyer, Audra Kagel M, MD.     Allergies:   Patient has no known allergies.   Social History   Socioeconomic History   Marital status: Married    Spouse name: Samuel Moyer   Number of children: 3   Years of education: Not on file   Highest education level: Bachelor's degree (e.g., BA, AB, BS)  Occupational History   Occupation: Retired    Fish farm manager: RETIRED    Comment: [employer]   Tobacco Use   Smoking status: Former    Packs/day: 1.00    Years: 16.00    Pack years: 16.00    Types: Cigarettes    Quit date: 06/30/1977    Years since quitting: 43.7   Smokeless tobacco: Never  Vaping Use   Vaping Use: Never used  Substance and Sexual Activity   Alcohol use: No   Drug use: No   Sexual activity: Not on file  Other Topics Concern   Not on file  Social History Narrative   03/23/20 lives with wife   2 caffeine drinks daily    Social Determinants of Health   Financial Resource Strain: Low Risk    Difficulty of Paying Living Expenses: Not hard at all  Food Insecurity: No Food Insecurity   Worried About Charity fundraiser in the Last Year: Never true   Arboriculturist in the Last Year: Never true   Transportation Needs: No Transportation Needs   Lack of Transportation (Medical): No   Lack of Transportation (Non-Medical): No  Physical Activity: Sufficiently Active   Days of Exercise per Week: 5 days   Minutes of Exercise per Session: 30 min  Stress: No Stress Concern Present   Feeling of Stress : Not at all  Social Connections: Socially Integrated   Frequency of Communication with Friends and Family: More than three times a week   Frequency of Social Gatherings with Friends and Family: More than three times a week   Attends Religious Services: More than 4 times per year   Active Member of [employer] or Organizations: Yes   Attends Archivist  Meetings: More than 4 times per year   Marital Status: Married     Family History: The patient's family history includes Colonic polyp in his sister; Heart attack in his father; Heart failure in his father; Hypertension in his father; Stroke in his mother. There is no history of Colon cancer or Stomach cancer.  ROS:   Please see the history of present illness.     EKGs/Labs/Other Studies Reviewed:    The following studies were reviewed today:  Echocardiogram 03/10/2020: Impressions:  1. There is basal-mid inferior and inferolateral left ventricular  hypokinesis. Left ventricular ejection fraction, by estimation, is 45 to  50%. The left ventricle has mildly decreased function. The left ventricle  demonstrates regional wall motion  abnormalities (see scoring diagram/findings for description). Left  ventricular diastolic function could not be evaluated. The average left  ventricular global longitudinal strain is -14.9 %. The global longitudinal  strain is abnormal.   2. Right ventricular systolic function is moderately reduced. The right  ventricular size is moderately enlarged. There is severely elevated  pulmonary artery systolic pressure.   3. Left atrial size was severely dilated.   4. Right atrial size was severely dilated.    5. The mitral valve is normal in structure. Mild to moderate mitral valve  regurgitation.   6. Tricuspid valve regurgitation is moderate.   7. The aortic valve is tricuspid. Aortic valve regurgitation is mild.  Mild aortic valve sclerosis is present, with no evidence of aortic valve  stenosis.   8. There is borderline dilatation of the ascending aorta, measuring 40  mm.   9. The inferior vena cava is normal in size with <50% respiratory  variability, suggesting right atrial pressure of 8 mmHg.   Comparison(s): A prior study was performed on 01/09/19. Endocardial  definition is better on the current study. Wall motion abnormalities were  probably present on the previous study. Estimated right atrial and  pulmonary artery pressure and right ventricular function all appear to have worsened on the current study.  EKG:  EKG not ordered today.  _______________  Right/Left Cardiac Catheterization 03/18/2020: Prox LAD to Mid LAD lesion is 20% stenosed. Prox Cx to Mid Cx lesion is 15% stenosed. Prox RCA lesion is 30% stenosed. LV end diastolic pressure is moderately elevated. Hemodynamic findings consistent with moderate pulmonary hypertension.   1. Mild nonobstructive CAD 2. Moderately elevated LV filling pressures. PCWP and EDP 21 mm Hg.  3. Moderate pulmonary HTN with mean PAP 42 mm Hg 4. Preserved cardiac output. Index 2.4.   Plan: will intensify medical therapy. Increase lasix to 80 mg in the morning and 40 mg in the afternoon. Stop lisinopril. After 3 days will begin Entresto 49/51 mg daily. Will titrate as tolerated. Consider SGLT2 inhibitor. May resume Pradaxa this evening.  Diagnostic Dominance: Right   Echo 01/06/21: IMPRESSIONS     1. Diffuse hypokinesis wore in inferior base EF similar to that seen on  TTE 03/10/20. Left ventricular ejection fraction, by estimation, is 40 to  45%. The left ventricle has mildly decreased function. The left ventricle  has no regional wall  motion  abnormalities. The left ventricular internal cavity size was mildly  dilated. Left ventricular diastolic parameters were normal.   2. Right ventricular systolic function is moderately reduced. The right  ventricular size is moderately enlarged.   3. Left atrial size was severely dilated.   4. Right atrial size was severely dilated.   5. The mitral valve is abnormal. Mild mitral valve  regurgitation. No  evidence of mitral stenosis.   6. Tricuspid valve regurgitation is moderate.   7. The aortic valve is tricuspid. Aortic valve regurgitation is mild. No  aortic stenosis is present.   8. The inferior vena cava is dilated in size with >50% respiratory  variability, suggesting right atrial pressure of 8 mmHg.   Comparison(s): EF 45%, basal-mid inferior and inferolateral left  ventricular  hypokinesis, ascending aorta 31m, mild AI, mild MAC.   Recent Labs: 02/23/2021: Pro B Natriuretic peptide (BNP) 4,543.0 02/28/2021: B Natriuretic Peptide 4,007.4 03/02/2021: TSH 2.286 03/03/2021: ALT 19; Hemoglobin 13.0; Platelets 147 03/04/2021: Magnesium 2.1 03/09/2021: BUN 37; Creatinine, Ser 1.53; Potassium 4.0; Sodium 140  Recent Lipid Panel    Component Value Date/Time   CHOL 161 10/14/2020 1057   TRIG 71.0 10/14/2020 1057   HDL 66.60 10/14/2020 1057   CHOLHDL 2 10/14/2020 1057   VLDL 14.2 10/14/2020 1057   LDLCALC 81 10/14/2020 1057   LDLCALC 83 10/20/2019 1038    Physical Exam:    Vital Signs: There were no vitals taken for this visit.    Wt Readings from Last 3 Encounters:  03/09/21 193 lb 12.6 oz (87.9 kg)  02/23/21 217 lb 3.2 oz (98.5 kg)  01/11/21 192 lb 9.6 oz (87.4 kg)     General: 81y.o. male in no acute distress. HEENT: Normocephalic and atraumatic. Sclera clear. Neck: Supple. No JVD. Heart: Irregularly irregular rhythm with normal rate. Distinct S1 and S2. No murmurs, gallops, or rubs. Radial pulses 2+ and equal bilaterally.  Lungs: No increased work of breathing.  Clear to ausculation bilaterally. No wheezes, rhonchi, or rales.  Abdomen: Soft, non-distended, and non-tender to palpation.  Extremities: TR-edema bilaterally. hyperpigmentation Skin: Warm and dry. Neuro:  No focal deficits. Psych: Normal affect. Responds appropriately.   Assessment:    No diagnosis found.      Plan:    1.  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 - recent Echo really unchanged with EF 45%.  - R/LHC on 03/18/2020 showed mild non-obstructive CAD with moderately elevated LV filling pressures (LVEDP of 21mg), moderate pulmonary hypertension, and preserved cardiac output. Continue  80 mg in the morning and 40 mg in the evening.  - on optimal therapy with Entresto, aldactone, Toprol XL and Jardiance.  - Discussed importance of daily weights and sodium/fluid restrictions.  - will check BMET and BNP today.  2. Non-Obstructive CAD - Noted on recent cardiac catheterization. - No chest pain. - No aspirin due to need for Pradaxa. - Continue statin.  3. Permanent Atrial Fibrillation - Rates well controlled. - Continue beta-blocker as above.  - Continue Pradaxa.  4. Hypertension - controlled.  - Continue medications for CHF as above. Also on Amlodipine  5. Hyperlipidemia - Lipid panel from 10/2019: Total Cholesterol 150, Triglycerides 53, HDL 54, LDL 83.  - On Lipitor 1028maily.  6. Pre-Diabetes - Hemoglobin A1c 6.1.  - Followed by PCP.  7. Obstructive Sleep Apnea - On BiPAP at night. - Followed by Pulmonology.  8. Acute Kidney injury. Will update lab work today.   Follow up in 3 months.   Medication Adjustments/Labs and Tests Ordered: Current medicines are reviewed at length with the patient today.  Concerns regarding medicines are outlined above.  No orders of the defined types were placed in this encounter.   No orders of the  defined types were placed in this  encounter.     There are no Patient Instructions on file for this visit.   Signed, Thedora Rings Martinique, MD  04/06/2021 3:06 PM    [facility]

## 2021-04-06 NOTE — Telephone Encounter (Signed)
Rep w/ [facility] states pts tod was 9:36 am on 04-10-21 ?

## 2021-04-11 ENCOUNTER — Ambulatory Visit: Payer: TRICARE For Life (TFL) | Admitting: Cardiology

## 2021-04-13 ENCOUNTER — Ambulatory Visit: Payer: TRICARE For Life (TFL) | Admitting: Internal Medicine

## 2021-04-30 NOTE — Telephone Encounter (Signed)
noted 

## 2021-04-30 NOTE — Telephone Encounter (Signed)
Daughter of patient calling in ? ?Wanting to let provider & nurse know that patient passed away around 8:30 this morning ?

## 2021-04-30 DEATH — deceased

## 2021-06-08 ENCOUNTER — Other Ambulatory Visit (HOSPITAL_COMMUNITY): Payer: Self-pay

## 2021-09-12 ENCOUNTER — Other Ambulatory Visit (HOSPITAL_COMMUNITY): Payer: Self-pay
